# Patient Record
Sex: Female | Born: 1955
Health system: Southern US, Community
[De-identification: ages and names within clinical notes are randomized; demographics above are authoritative.]

## PROBLEM LIST (undated history)

## (undated) DIAGNOSIS — C801 Malignant (primary) neoplasm, unspecified: Secondary | ICD-10-CM

## (undated) DIAGNOSIS — K219 Gastro-esophageal reflux disease without esophagitis: Secondary | ICD-10-CM

## (undated) DIAGNOSIS — I10 Essential (primary) hypertension: Secondary | ICD-10-CM

## (undated) DIAGNOSIS — Z8669 Personal history of other diseases of the nervous system and sense organs: Secondary | ICD-10-CM

## (undated) DIAGNOSIS — L4050 Arthropathic psoriasis, unspecified: Secondary | ICD-10-CM

## (undated) DIAGNOSIS — Z87442 Personal history of urinary calculi: Secondary | ICD-10-CM

## (undated) DIAGNOSIS — N35919 Unspecified urethral stricture, male, unspecified site: Secondary | ICD-10-CM

## (undated) DIAGNOSIS — Z789 Other specified health status: Secondary | ICD-10-CM

## (undated) DIAGNOSIS — E785 Hyperlipidemia, unspecified: Secondary | ICD-10-CM

## (undated) DIAGNOSIS — E119 Type 2 diabetes mellitus without complications: Secondary | ICD-10-CM

## (undated) DIAGNOSIS — M1990 Unspecified osteoarthritis, unspecified site: Secondary | ICD-10-CM

## (undated) DIAGNOSIS — Z87440 Personal history of urinary (tract) infections: Secondary | ICD-10-CM

## (undated) DIAGNOSIS — M459 Ankylosing spondylitis of unspecified sites in spine: Secondary | ICD-10-CM

## (undated) DIAGNOSIS — C541 Malignant neoplasm of endometrium: Secondary | ICD-10-CM

## (undated) HISTORY — DX: Malignant (primary) neoplasm, unspecified: C80.1

## (undated) HISTORY — DX: Unspecified osteoarthritis, unspecified site: M19.90

## (undated) HISTORY — DX: Essential (primary) hypertension: I10

## (undated) HISTORY — DX: Type 2 diabetes mellitus without complications: E11.9

## (undated) HISTORY — PX: BREAST BIOPSY: SHX20

## (undated) HISTORY — PX: TONSILLECTOMY: SUR1361

## (undated) HISTORY — PX: EYE SURGERY: SHX253

## (undated) HISTORY — DX: Hyperlipidemia, unspecified: E78.5

---
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
## 1990-03-27 HISTORY — PX: CHOLECYSTECTOMY: SHX55

## 1997-11-09 ENCOUNTER — Encounter: Admission: RE | Admit: 1997-11-09 | Discharge: 1997-11-09 | Payer: Self-pay | Admitting: Internal Medicine

## 1997-12-21 ENCOUNTER — Encounter: Payer: Self-pay | Admitting: Hematology and Oncology

## 1997-12-21 ENCOUNTER — Other Ambulatory Visit: Admission: RE | Admit: 1997-12-21 | Discharge: 1997-12-21 | Payer: Self-pay | Admitting: *Deleted

## 1997-12-21 ENCOUNTER — Encounter: Admission: RE | Admit: 1997-12-21 | Discharge: 1997-12-21 | Payer: Self-pay | Admitting: Hematology and Oncology

## 1999-02-22 ENCOUNTER — Encounter: Admission: RE | Admit: 1999-02-22 | Discharge: 1999-02-22 | Payer: Self-pay | Admitting: Hematology and Oncology

## 1999-02-28 ENCOUNTER — Encounter: Admission: RE | Admit: 1999-02-28 | Discharge: 1999-02-28 | Payer: Self-pay | Admitting: Internal Medicine

## 1999-03-02 ENCOUNTER — Encounter: Admission: RE | Admit: 1999-03-02 | Discharge: 1999-03-02 | Payer: Self-pay | Admitting: Internal Medicine

## 2000-07-05 ENCOUNTER — Encounter: Payer: Self-pay | Admitting: Obstetrics and Gynecology

## 2000-07-05 ENCOUNTER — Ambulatory Visit (HOSPITAL_COMMUNITY): Admission: RE | Admit: 2000-07-05 | Discharge: 2000-07-05 | Payer: Self-pay

## 2000-07-06 ENCOUNTER — Other Ambulatory Visit: Admission: RE | Admit: 2000-07-06 | Discharge: 2000-07-06 | Payer: Self-pay | Admitting: Gynecology

## 2000-08-29 ENCOUNTER — Other Ambulatory Visit: Admission: RE | Admit: 2000-08-29 | Discharge: 2000-08-29 | Payer: Self-pay | Admitting: Gynecology

## 2000-08-29 ENCOUNTER — Encounter (INDEPENDENT_AMBULATORY_CARE_PROVIDER_SITE_OTHER): Payer: Self-pay

## 2001-12-17 ENCOUNTER — Encounter: Payer: Self-pay | Admitting: Otolaryngology

## 2001-12-19 ENCOUNTER — Ambulatory Visit (HOSPITAL_COMMUNITY): Admission: RE | Admit: 2001-12-19 | Discharge: 2001-12-19 | Payer: Self-pay | Admitting: Otolaryngology

## 2001-12-19 ENCOUNTER — Encounter (INDEPENDENT_AMBULATORY_CARE_PROVIDER_SITE_OTHER): Payer: Self-pay | Admitting: *Deleted

## 2002-01-12 ENCOUNTER — Observation Stay (HOSPITAL_COMMUNITY): Admission: EM | Admit: 2002-01-12 | Discharge: 2002-01-14 | Payer: Self-pay | Admitting: Emergency Medicine

## 2002-01-12 ENCOUNTER — Encounter: Payer: Self-pay | Admitting: Emergency Medicine

## 2002-01-14 ENCOUNTER — Encounter (HOSPITAL_BASED_OUTPATIENT_CLINIC_OR_DEPARTMENT_OTHER): Payer: Self-pay | Admitting: Internal Medicine

## 2002-01-16 ENCOUNTER — Encounter (HOSPITAL_BASED_OUTPATIENT_CLINIC_OR_DEPARTMENT_OTHER): Payer: Self-pay | Admitting: Internal Medicine

## 2002-01-16 ENCOUNTER — Ambulatory Visit (HOSPITAL_COMMUNITY): Admission: RE | Admit: 2002-01-16 | Discharge: 2002-01-16 | Payer: Self-pay | Admitting: Internal Medicine

## 2002-08-01 ENCOUNTER — Other Ambulatory Visit: Admission: RE | Admit: 2002-08-01 | Discharge: 2002-08-01 | Payer: Self-pay | Admitting: Gynecology

## 2005-11-23 ENCOUNTER — Other Ambulatory Visit: Payer: Self-pay

## 2005-11-28 ENCOUNTER — Ambulatory Visit: Payer: Self-pay | Admitting: Otolaryngology

## 2006-05-23 ENCOUNTER — Ambulatory Visit: Payer: Self-pay

## 2006-06-01 ENCOUNTER — Ambulatory Visit: Payer: Self-pay

## 2006-06-15 ENCOUNTER — Encounter: Admission: RE | Admit: 2006-06-15 | Discharge: 2006-06-15 | Payer: Self-pay | Admitting: Unknown Physician Specialty

## 2006-06-28 ENCOUNTER — Encounter: Admission: RE | Admit: 2006-06-28 | Discharge: 2006-06-28 | Payer: Self-pay | Admitting: Surgery

## 2006-06-28 ENCOUNTER — Encounter (INDEPENDENT_AMBULATORY_CARE_PROVIDER_SITE_OTHER): Payer: Self-pay | Admitting: Specialist

## 2007-01-25 ENCOUNTER — Emergency Department: Payer: Self-pay | Admitting: Emergency Medicine

## 2007-01-25 ENCOUNTER — Emergency Department (HOSPITAL_COMMUNITY): Admission: EM | Admit: 2007-01-25 | Discharge: 2007-01-25 | Payer: Self-pay | Admitting: Emergency Medicine

## 2010-08-12 NOTE — Op Note (Signed)
NAME:  Jean Gonzales NO.:  [id number]   MEDICAL RECORD NO.:  [id number]                   PATIENT TYPE:  OIB   LOCATION:  NA                                   FACILITY:  [facility]   PHYSICIAN:  Kinnie Scales. Annalee Genta, M.D.            DATE OF BIRTH:  06-05-1955   DATE OF PROCEDURE:  12/19/2001  DATE OF DISCHARGE:                                 OPERATIVE REPORT   PREOPERATIVE DIAGNOSES:  1. Chronic sore throat.  2. Lingual tonsillar hypertrophy.  3. Possible left tongue base mass.   POSTOPERATIVE DIAGNOSES:  1. Chronic sore throat.  2. Lingual tonsillar hypertrophy.  3. Possible left tongue base mass.   PROCEDURE PERFORMED:  1. Direct laryngoscopy.  2. Biopsy of left base of tongue.   SURGEON:  Kinnie Scales. Annalee Genta, M.D.   ANESTHESIA:  General endotracheal.   COMPLICATIONS:  None.   ESTIMATED BLOOD LOSS:  Minimal.   The patient transferred from the operating room to the recovery room in  stable condition.   INDICATIONS FOR PROCEDURE:  [name] is a 55 year old white female who has  been followed with a history of chronic low grade dysphagia, intermittent  sore throat and possible base of tongue mass.  The patient has been treated  for gastroesophageal reflux and has been treated with several courses of  antibiotics.  Over the last several months, she has had increasing symptoms  of fullness, sore throat and pressure in the left base of tongue region.  Previous imaging including MRI and CT scan showed linguotonsillar  hypertrophy with possible mass.  Given the patient's history and  examination, I recommended that we undertake examination under general  anesthesia including direct laryngoscopy and biopsy of the left base of  tongue.  The risks, benefits and possible complications of these procedures  were discussed in detail with the patient who understood and concurred with  our plan for surgery which was scheduled as outlined above.   DESCRIPTION OF PROCEDURE:  The patient was brought to the operating room on  December 19, 2001 and placed in supine position on the operating table.  General endotracheal anesthesia was established without difficulty.  When  the patient was adequately anesthetized, her oral cavity and oropharynx were  examined and there were no loose or broken teeth, no bleeding and no  evidence of mass or lesion.  Using a Dedo laryngoscope, direct laryngoscopy  was performed.  The piriform sinus, hypopharynx, larynx and vallecula were  thoroughly examined.  Vocal cords were normal and there was no evidence of  mass, ulcer or lesion.  The patient's base of tongue showed lingual  tonsillar hypertrophy.  Palpation revealed soft tissue, no evidence of a  mass or other abnormality.  There is no evidence of invasive lesion or  tumor.  Given the patient's history and findings on previous imaging  studies, a biopsy of the left base  of tongue was undertaken using large cup  forceps.  Multiple biopsies of the enlarged lingual tonsil and underlying  submucosal tissue were biopsied and sent to pathology for gross and  microscopic evaluation.  There was minimal bleeding.  The patient tolerated  the procedure without complication or difficulty.  At the completion of the  procedure, the oral cavity and oropharynx were irrigated and suctioned.  She  was awakened from her anesthetic, extubated and transferred from the  operating room to the recovery room in stable condition.                                                 Kinnie Scales. Annalee Genta, M.D.    DLS/MEDQ  D:  91/[id number]/8295  T:  12/19/2001  Job:  [id number]

## 2010-08-12 NOTE — Discharge Summary (Signed)
NAME:  Jean Gonzales NO.:  [id number]   MEDICAL RECORD NO.:  [id number]                   PATIENT TYPE:  INP   LOCATION:  2021                                 FACILITY:  [facility]   PHYSICIAN:  Barry Dienes. Eloise Harman, M.D.            DATE OF BIRTH:  March 10, 1956   DATE OF ADMISSION:  01/12/2002  DATE OF DISCHARGE:  01/14/2002                                 DISCHARGE SUMMARY   PERTINENT FINDINGS:  The patient is a 55 year old white female with a  history of ankylosing spondylitis, gastroesophageal reflux disease, and  anxiety who presented to the [facility] after developing  sudden pleuritic chest pain at approximately  10 a.m. while she was on the  way to church.  The pain persisted over one half hour at church, so she  presented to the emergency room  for evaluation.  The pain persisted in the  emergency room.  She was not given nitroglycerin as her systolic blood  pressure  was less than 100.  She also related that she had had twinges of  similar discomfort radiating to her left arm earlier in the week.  She  denied significant dyspnea, nausea, vomiting, or diaphoresis.  She notes  that this discomfort was somewhat different than her typical reflux  discomfort that is more of a burning lower chest pain and not so sharp and  pleuritic.  She denied difficulty swallowing.  There is no family history of  a hypercoagulable disorder.   INITIAL PHYSICAL EXAMINATION:  Temperature was 97.4, pulse 62, respiratory  rate 16, blood pressure  99/54.  Oxygen saturation on room air 98%.  In  general, she is a well-nourished, well-developed white female who was in no  acute distress.  HEENT exam was within normal limits.  Neck was supple  without jugular venous distention or carotid bruit.  Chest was clear to  auscultation.  Heart had a regular rate and rhythm.  S1 and S2 were present  without murmur, gallop or rub.  Abdomen had normal bowel sounds with no  hepatosplenomegaly or tenderness.  Extremities were without cyanosis,  clubbing or edema.  Neurological exam was nonfocal.   LABORATORY STUDIES:  EKG showed normal sinus rhythm, no acute changes.  White blood cell count 8.5, hemoglobin 12.9, hematocrit 39, platelets 286,  BUN 10, creatinine 0.3, CK 68, troponin I 0.05.  Arterial blood gases within  normal limits.   HOSPITAL COURSE:  The patient was admitted for further evaluation.  Serial  cardiac enzymes included a subsequent CK level that was 56 and a  subsequently troponin I level that was 0.05.  A chest x-ray showed no acute  cardiopulmonary disease.  A ventilation perfusion scan was a normal study.  A Cardiolite exercise test was performed on 10/21 in which she exercised  nine minutes on the Bruce protocol developing a peak heart rate of 156 with  a target of 146 and displaying a normal blood pressure  response to  exercise.  She had no chest discomfort and no significant EKG changes.  Cardiolite images were pending at the time of dictation.   PROCEDURES:  Ventilation perfusion scan and Cardiolite exercise test.   COMPLICATIONS:  None.   CONDITION ON DISCHARGE:  She feels fine and is no longer having any chest  discomfort.  She denies shortness of breath or nausea.   DISCHARGE DIAGNOSES:  1. Chest pain, pleuritic.  2. Gastroesophageal reflux disease.  3. Ankylosing spondylitis.  4. Anxiety and depression.   DISCHARGE MEDICATIONS:  1. Nexium 40 mg p.o. q.d.  2. Lexapro 10 mg p.o. q.d.   SPECIAL INSTRUCTIONS:  She will be scheduled for an upper GI series prior to  discharge.   FOLLOWUP PLANS:  She should be seen at Dr. Silvano Rusk office within two to  three weeks following discharge and was given a number to call to set up  that appointment.                                               Barry Dienes Eloise Harman, M.D.    DGP/MEDQ  D:  01/14/2002  T:  01/14/2002  Job:  [id number]

## 2010-08-12 NOTE — H&P (Signed)
NAME:  Jean Gonzales NO.:  [id number]   MEDICAL RECORD NO.:  [id number]                   PATIENT TYPE:  INP   LOCATION:  2021                                 FACILITY:  [facility]   PHYSICIAN:  Gaspar Garbe, M.D.            DATE OF BIRTH:  Nov 21, 1955   DATE OF ADMISSION:  01/12/2002  DATE OF DISCHARGE:                                HISTORY & PHYSICAL   CHIEF COMPLAINT:  Chest pain.   HISTORY OF PRESENT ILLNESS:  The patient is a 55 year old white female with  a history of ankylosing spondylitis, anxiety, and gastroesophageal reflux  disease who presents to the ER after developing chest pain which is sharp in  onset and took her breath at 10 a.m.  She was on her way to church when it  happened.  Spent approximately 1/2 hour at church, but the pain was too bad  and went home, and subsequently went to the emergency room.  She did not  receive nitroglycerin because her normal systolic blood pressure was less  than 100.  She indicates that the pain is still there.  She describes  earlier in the week having some pain into her left arm, which she thought  was more muscular in nature, being located at the shoulder and along the  brachial radialis on the left arm.  She indicates that she still has some  pain and is tender to the touch, and it hurts worse with a deep breath.  She  indicates that she has had a pain like this approximately two months ago  which was accompanied with some vertigo.  This episode, however, has not  been accompanied with anything else other then chest pain, and she denies  any sort of nausea, vomiting, headache, or other changes otherwise.   ALLERGIES:  IODINE.   MEDICATIONS:  1. Nexium 40 mg q.d.  2. Lexapro 10 mg q.d.   PAST MEDICAL HISTORY:  1. Ankylosing spondylitis.  2. Gastroesophageal reflux disease.  3. Anxiety/depression.   PAST SURGICAL HISTORY:  1. Laryngeal biopsy recently by Dr. Osborn Coho here in  [place].  The     results of that are not available and thought to be benign.  2. Tonsillectomy at age 17.  3. Cholecystectomy.  4. Ovarian removal.  The patient believes this to be on the right, secondary     to an ovarian rupture.  5. Right shoulder surgery, thought to be bone spur.   SOCIAL HISTORY:  The patient lives in [place] by herself.  Her son lives  there part-time.  She works for [employer], performing x-rays.  The patient is currently separated with two children.  One of her children  was murdered at the age of 69.  The patient is a nonsmoker, nondrinker, and  does not prescribe to any sick diet.   FAMILY HISTORY:  Mother died of cancer of the  lung at age 22.  Father is  alive at age 24.  He has had multiple difficulties with peripheral vascular  disease with bypasses in his legs.  He has also had carotid endarterectomy,  and has an aneurysm which is inoperable, as well as heart disease and  pulmonary problems.  She has two brothers who also have heart disease and  hypertension, and one has had mild transient ischemic attacks.  She has  three brothers and two sisters total.   REVIEW OF SYMPTOMS:  Negative for fevers, chills, or sweats, headache,  nausea, or vomiting.  The patient does note chest pain with no shortness of  breath at rest, and no dyspnea on exertion.  Denies any sort of cough or  wheezing.  Denies any change in level of consciousness.  All other systems  are negative.   ADVANCED DIRECTIVES:  The patient is a full code.   PHYSICAL EXAMINATION:  VITAL SIGNS:  Temperature 97.4, pulse 62, respiratory  rate 16, blood pressure 99/54, pain is a 2/10.  Currently oxygen saturations  are 98%.  GENERAL:  In no acute distress.  HEENT:  Pupils equal, round, reactive to light and accommodation.  Extraocular movements were intact.  ENT within normal limits.  NECK:  Supple, no lymphadenopathy, JVD, or bruit.  HEART:  Regular rate and rhythm, no  murmurs, rubs, or gallops.  LUNGS:  Clear to auscultation bilaterally.  CHEST:  The patient has some tenderness along the clavicular line of her  breast into her muscle on the left.  ABDOMEN:  Soft, nontender, normoactive bowel sounds, no hepatosplenomegaly  appreciated.  EXTREMITIES:  No cyanosis, clubbing, or edema.  The patient has mild  shoulder and elbow pain on the left with mild compression.  MUSCULOSKELETAL:  Except for above, otherwise normal.  NEUROLOGIC:  Oriented x3 with 1+ deep tendon reflexes and downgoing toes.   LABORATORY DATA:  The patient had a normal V/Q scan and normal chest x-ray.  Her electrocardiogram was normal sinus rhythm with no acute changes.  White  count 8.5, hemoglobin 12.9, hematocrit 39, platelets 286.  BUN 10,  creatinine 0.3.  Electrolytes otherwise within normal limits.  CK 68, CK-MB  1.5, troponin-I borderline elevated at 0.05.  She had an ABG which was  within normal limits as well.   ASSESSMENT AND PLAN:  1. Chest pain.  Pleuritic versus muscular versus cardiac.  She has     borderline initial enzymes and will be kept on telemetry and ruled out     with enzymes.  She will be given 125 mg of aspirin, beta blockade was     held secondary to her low blood pressure, and we will try to use     nitroglycerin if her blood pressure goes above her normal low, although     due to the palpable nature of her chest pain, I doubt that this is     specifically cardiac.  She denies any sort of trauma to her chest or     anything that could have caused the pain.  2. Anxiety.  Continue Lexapro.  3. Gastroesophageal reflux disease.  Continue Nexium.  4. Ankylosing spondylitis.  We will provide Tylenol and ask that she do her     normal stretching while in the hospital as back pain may worsen due to a     confined nature given her need to stay in bed on telemetry.  Gaspar Garbe, M.D.    RWT/MEDQ  D:   01/12/2002  T:  01/13/2002  Job:  [id number]

## 2011-01-04 LAB — CBC
HCT: 37.2
MCHC: 34
MCV: 86.8
RBC: 4.29
RDW: 13.5
WBC: 9.2

## 2011-01-04 LAB — URINALYSIS, ROUTINE W REFLEX MICROSCOPIC
Glucose, UA: NEGATIVE
Nitrite: NEGATIVE

## 2011-01-04 LAB — I-STAT 8, (EC8 V) (CONVERTED LAB)
Acid-Base Excess: 2
Bicarbonate: 28.4 — ABNORMAL HIGH
Chloride: 109
Glucose, Bld: 92
Hemoglobin: 14.3
Operator id: 270111
Potassium: 3.4 — ABNORMAL LOW
Sodium: 143
pCO2, Ven: 50.9 — ABNORMAL HIGH
pH, Ven: 7.354 — ABNORMAL HIGH

## 2011-01-04 LAB — POCT CARDIAC MARKERS
Myoglobin, poc: 58.8
Myoglobin, poc: 70.2
Operator id: 234501
Troponin i, poc: <0.05
Troponin i, poc: <0.05

## 2011-01-04 LAB — DIFFERENTIAL
Basophils Relative: 1
Lymphs Abs: 3
Neutrophils Relative %: 59

## 2011-01-04 LAB — URINE MICROSCOPIC-ADD ON

## 2011-01-04 LAB — POCT I-STAT CREATININE
Creatinine, Ser: 0.7
Operator id: 270111

## 2011-01-04 LAB — POCT PREGNANCY, URINE: Preg Test, Ur: NEGATIVE

## 2012-03-27 DIAGNOSIS — C801 Malignant (primary) neoplasm, unspecified: Secondary | ICD-10-CM

## 2012-03-27 DIAGNOSIS — C541 Malignant neoplasm of endometrium: Secondary | ICD-10-CM

## 2012-03-27 HISTORY — DX: Malignant neoplasm of endometrium: C54.1

## 2012-03-27 HISTORY — DX: Malignant (primary) neoplasm, unspecified: C80.1

## 2012-05-20 ENCOUNTER — Ambulatory Visit: Payer: Self-pay

## 2012-12-11 ENCOUNTER — Ambulatory Visit: Payer: Self-pay

## 2012-12-13 ENCOUNTER — Ambulatory Visit: Payer: Self-pay

## 2012-12-19 ENCOUNTER — Ambulatory Visit: Payer: Self-pay

## 2013-03-14 DIAGNOSIS — M069 Rheumatoid arthritis, unspecified: Secondary | ICD-10-CM | POA: Insufficient documentation

## 2013-03-27 HISTORY — PX: ABDOMINAL HYSTERECTOMY: SHX81

## 2013-12-03 DIAGNOSIS — IMO0002 Reserved for concepts with insufficient information to code with codable children: Secondary | ICD-10-CM | POA: Insufficient documentation

## 2013-12-03 DIAGNOSIS — N35919 Unspecified urethral stricture, male, unspecified site: Secondary | ICD-10-CM | POA: Insufficient documentation

## 2013-12-29 ENCOUNTER — Ambulatory Visit: Payer: Self-pay | Admitting: Family Medicine

## 2014-04-21 DIAGNOSIS — R938 Abnormal findings on diagnostic imaging of other specified body structures: Secondary | ICD-10-CM | POA: Diagnosis not present

## 2014-04-21 DIAGNOSIS — C541 Malignant neoplasm of endometrium: Secondary | ICD-10-CM | POA: Diagnosis not present

## 2014-06-03 DIAGNOSIS — M069 Rheumatoid arthritis, unspecified: Secondary | ICD-10-CM | POA: Diagnosis not present

## 2014-06-03 DIAGNOSIS — I10 Essential (primary) hypertension: Secondary | ICD-10-CM | POA: Diagnosis not present

## 2014-06-03 DIAGNOSIS — Z1389 Encounter for screening for other disorder: Secondary | ICD-10-CM | POA: Diagnosis not present

## 2014-06-03 DIAGNOSIS — F329 Major depressive disorder, single episode, unspecified: Secondary | ICD-10-CM | POA: Diagnosis not present

## 2014-06-03 DIAGNOSIS — K219 Gastro-esophageal reflux disease without esophagitis: Secondary | ICD-10-CM | POA: Diagnosis not present

## 2014-06-03 DIAGNOSIS — E119 Type 2 diabetes mellitus without complications: Secondary | ICD-10-CM | POA: Diagnosis not present

## 2014-06-03 DIAGNOSIS — E782 Mixed hyperlipidemia: Secondary | ICD-10-CM | POA: Diagnosis not present

## 2014-06-03 DIAGNOSIS — E559 Vitamin D deficiency, unspecified: Secondary | ICD-10-CM | POA: Diagnosis not present

## 2014-07-13 DIAGNOSIS — G629 Polyneuropathy, unspecified: Secondary | ICD-10-CM | POA: Diagnosis not present

## 2014-07-13 DIAGNOSIS — C55 Malignant neoplasm of uterus, part unspecified: Secondary | ICD-10-CM | POA: Diagnosis not present

## 2014-07-13 DIAGNOSIS — C541 Malignant neoplasm of endometrium: Secondary | ICD-10-CM | POA: Diagnosis not present

## 2014-07-13 DIAGNOSIS — Z79899 Other long term (current) drug therapy: Secondary | ICD-10-CM | POA: Diagnosis not present

## 2014-07-13 DIAGNOSIS — I10 Essential (primary) hypertension: Secondary | ICD-10-CM | POA: Diagnosis not present

## 2014-07-13 DIAGNOSIS — E119 Type 2 diabetes mellitus without complications: Secondary | ICD-10-CM | POA: Diagnosis not present

## 2014-09-16 DIAGNOSIS — E119 Type 2 diabetes mellitus without complications: Secondary | ICD-10-CM | POA: Diagnosis not present

## 2014-10-06 DIAGNOSIS — Z90710 Acquired absence of both cervix and uterus: Secondary | ICD-10-CM | POA: Diagnosis not present

## 2014-10-06 DIAGNOSIS — Z08 Encounter for follow-up examination after completed treatment for malignant neoplasm: Secondary | ICD-10-CM | POA: Diagnosis not present

## 2014-10-06 DIAGNOSIS — R87610 Atypical squamous cells of undetermined significance on cytologic smear of cervix (ASC-US): Secondary | ICD-10-CM | POA: Diagnosis not present

## 2014-10-06 DIAGNOSIS — R300 Dysuria: Secondary | ICD-10-CM | POA: Diagnosis not present

## 2014-10-06 DIAGNOSIS — E119 Type 2 diabetes mellitus without complications: Secondary | ICD-10-CM | POA: Diagnosis not present

## 2014-10-06 DIAGNOSIS — Z1272 Encounter for screening for malignant neoplasm of vagina: Secondary | ICD-10-CM | POA: Diagnosis not present

## 2014-10-06 DIAGNOSIS — Z90722 Acquired absence of ovaries, bilateral: Secondary | ICD-10-CM | POA: Diagnosis not present

## 2014-10-06 DIAGNOSIS — Z923 Personal history of irradiation: Secondary | ICD-10-CM | POA: Diagnosis not present

## 2014-10-06 DIAGNOSIS — C541 Malignant neoplasm of endometrium: Secondary | ICD-10-CM | POA: Diagnosis not present

## 2014-10-06 DIAGNOSIS — R87620 Atypical squamous cells of undetermined significance on cytologic smear of vagina (ASC-US): Secondary | ICD-10-CM | POA: Diagnosis not present

## 2014-10-06 DIAGNOSIS — Z8544 Personal history of malignant neoplasm of other female genital organs: Secondary | ICD-10-CM | POA: Diagnosis not present

## 2014-10-06 DIAGNOSIS — Z79899 Other long term (current) drug therapy: Secondary | ICD-10-CM | POA: Diagnosis not present

## 2015-01-04 DIAGNOSIS — Z7982 Long term (current) use of aspirin: Secondary | ICD-10-CM | POA: Diagnosis not present

## 2015-01-04 DIAGNOSIS — K219 Gastro-esophageal reflux disease without esophagitis: Secondary | ICD-10-CM | POA: Diagnosis not present

## 2015-01-04 DIAGNOSIS — C541 Malignant neoplasm of endometrium: Secondary | ICD-10-CM | POA: Diagnosis not present

## 2015-01-04 DIAGNOSIS — N950 Postmenopausal bleeding: Secondary | ICD-10-CM | POA: Diagnosis not present

## 2015-01-04 DIAGNOSIS — I10 Essential (primary) hypertension: Secondary | ICD-10-CM | POA: Diagnosis not present

## 2015-01-04 DIAGNOSIS — M069 Rheumatoid arthritis, unspecified: Secondary | ICD-10-CM | POA: Diagnosis not present

## 2015-01-04 DIAGNOSIS — E1142 Type 2 diabetes mellitus with diabetic polyneuropathy: Secondary | ICD-10-CM | POA: Diagnosis not present

## 2015-02-22 ENCOUNTER — Ambulatory Visit (INDEPENDENT_AMBULATORY_CARE_PROVIDER_SITE_OTHER): Payer: Medicare Other | Admitting: Physician Assistant

## 2015-02-22 ENCOUNTER — Encounter: Payer: Self-pay | Admitting: Physician Assistant

## 2015-02-22 VITALS — BP 114/80 | HR 66 | Temp 98.0°F | Resp 16 | Ht 67.0 in | Wt 250.8 lb

## 2015-02-22 DIAGNOSIS — E1142 Type 2 diabetes mellitus with diabetic polyneuropathy: Secondary | ICD-10-CM

## 2015-02-22 DIAGNOSIS — R32 Unspecified urinary incontinence: Secondary | ICD-10-CM

## 2015-02-22 DIAGNOSIS — R530 Neoplastic (malignant) related fatigue: Secondary | ICD-10-CM | POA: Diagnosis not present

## 2015-02-22 DIAGNOSIS — E1169 Type 2 diabetes mellitus with other specified complication: Secondary | ICD-10-CM | POA: Insufficient documentation

## 2015-02-22 DIAGNOSIS — Z7689 Persons encountering health services in other specified circumstances: Secondary | ICD-10-CM

## 2015-02-22 DIAGNOSIS — M069 Rheumatoid arthritis, unspecified: Secondary | ICD-10-CM | POA: Diagnosis not present

## 2015-02-22 DIAGNOSIS — I152 Hypertension secondary to endocrine disorders: Secondary | ICD-10-CM | POA: Insufficient documentation

## 2015-02-22 DIAGNOSIS — E785 Hyperlipidemia, unspecified: Secondary | ICD-10-CM | POA: Diagnosis not present

## 2015-02-22 DIAGNOSIS — I10 Essential (primary) hypertension: Secondary | ICD-10-CM | POA: Insufficient documentation

## 2015-02-22 DIAGNOSIS — Z7189 Other specified counseling: Secondary | ICD-10-CM

## 2015-02-22 DIAGNOSIS — C541 Malignant neoplasm of endometrium: Secondary | ICD-10-CM | POA: Diagnosis not present

## 2015-02-22 DIAGNOSIS — E1159 Type 2 diabetes mellitus with other circulatory complications: Secondary | ICD-10-CM | POA: Insufficient documentation

## 2015-02-22 DIAGNOSIS — M1990 Unspecified osteoarthritis, unspecified site: Secondary | ICD-10-CM | POA: Insufficient documentation

## 2015-02-22 DIAGNOSIS — Z8669 Personal history of other diseases of the nervous system and sense organs: Secondary | ICD-10-CM | POA: Insufficient documentation

## 2015-02-22 DIAGNOSIS — L4050 Arthropathic psoriasis, unspecified: Secondary | ICD-10-CM

## 2015-02-22 DIAGNOSIS — Z86010 Personal history of colonic polyps: Secondary | ICD-10-CM | POA: Insufficient documentation

## 2015-02-22 DIAGNOSIS — K219 Gastro-esophageal reflux disease without esophagitis: Secondary | ICD-10-CM | POA: Insufficient documentation

## 2015-02-22 MED ORDER — NYSTATIN 100000 UNIT/GM EX POWD: CUTANEOUS | Status: DC

## 2015-02-22 MED ORDER — TRIAMCINOLONE ACETONIDE 0.1 % EX OINT: TOPICAL_OINTMENT | Freq: Two times a day (BID) | CUTANEOUS | Status: DC

## 2015-02-22 NOTE — Progress Notes (Signed)
Patient: Jean Gonzales, Female    DOB: 1955-06-12, 59 y.o.   MRN: [id number] Visit Date: 02/22/2015  Today's Provider: Mar Daring, PA-C   Chief Complaint  Patient presents with  . Establish Care   Subjective:    Annual physical exam Jean Gonzales is a 60 y.o. female who presents today for health maintenance and complete physical. She feels well. She reports not exercising. She reports she is sleeping well. Per patient just here to Establish Care. Per patient moved from [place]. Patient gets pap smear every 3 months done at [facility]. Patient has a history of endometrial cancer and serous cancer. She underwent total hysterectomy followed by 2 types of chemotherapy and radiation therapy.  In December 18, 2013 patient was cancer free. Per patient thinks that is due for a mammogram. She gets a mammogram done every 6 months.  This is also done at [facility].  She also follows up with her radiologist every 3 months as well.  She states she was most recently seen for her pap in September and should go back in late December to early January.  She also will be seeing her radiologist come early December. She also has rheumatoid arthritis. She had rheumatoid arthritis prior to the cancer diagnosis 2 years ago. She states that her wrist has completely fused and she has no range of motion in it now. She can still move the fingers of the right hand however. She also states that she has arthritis in her cervical spine and lumbar spine. She also has psoriatic arthritis. His has worsened since the chemotherapy. She has a current flare of her scalp and right lower posterior calf. She has been putting triamcinolone cream on this. She would like a referral to dermatology for further evaluation of this.  Review of Systems  Constitutional: Negative.   HENT: Negative.   Eyes: Negative.   Respiratory: Negative.   Cardiovascular: Negative.   Gastrointestinal: Positive for diarrhea (chronic  since chemotherapy).  Endocrine: Negative.   Genitourinary: Positive for frequency Laurance Flatten, and since chemotherapy and total hysterectomy.) and enuresis (Has worsened since total hysterectomy and radiation therapy.). Negative for menstrual problem and pelvic pain.  Musculoskeletal: Positive for back pain, arthralgias, neck pain and neck stiffness.  Allergic/Immunologic: Negative.   Neurological: Positive for dizziness, light-headedness (One blood sugar drops) and numbness (Neuropathy of her feet and legs secondary to chemotherapy; numbness up her right leg and pelvic region secondary to radiation therapy).  Hematological: Negative.   Psychiatric/Behavioral: Negative.     Social History      She  reports that she has never smoked. She has never used smokeless tobacco. She reports that she does not drink alcohol or use illicit drugs.       Social History   Social History  . Marital Status: Divorced    Spouse Name: N/A  . Number of Children: N/A  . Years of Education: N/A   Social History Main Topics  . Smoking status: Never Smoker   . Smokeless tobacco: Never Used  . Alcohol Use: No  . Drug Use: No  . Sexual Activity: Not Asked   Other Topics Concern  . None   Social History Narrative  . None    Patient Active Problem List   Diagnosis Date Noted  . Malignant neoplasm of endometrium ([place]) 02/22/2015  . Acid reflux 02/22/2015  . History of migraine headaches 02/22/2015  . HLD (hyperlipidemia) 02/22/2015  . BP (high blood pressure)  02/22/2015  . Arthritis 02/22/2015  . Cancer of endometrium ([place]) 02/22/2015  . Diabetes mellitus ([place]) 10/06/2014  . Urethral stenosis 12/03/2013  . Rheumatoid arthritis ([place]) 03/14/2013    Past Surgical History  Procedure Laterality Date  . Abdominal hysterectomy  2015    cancer    Family History        No family status information on file.        Her family history is not on file.    Allergies  Allergen Reactions  . Iodinated  Diagnostic Agents Anaphylaxis  . Iodine Swelling    Previous Medications   ASPIRIN EC 81 MG TABLET    Take 81 mg by mouth.   ATORVASTATIN (LIPITOR) 40 MG TABLET       CELECOXIB (CELEBREX) 100 MG CAPSULE    Take 100 mg by mouth 2 (two) times daily.   GABAPENTIN (NEURONTIN) 300 MG CAPSULE    TAKE 2 CAPSULES (600MG ) BY MOUTH 3 TIMES A DAY   HYDROCHLOROTHIAZIDE (HYDRODIURIL) 25 MG TABLET    Take by mouth.   MAGNESIUM OXIDE 420 MG TABS    Take by mouth.   METFORMIN (GLUCOPHAGE) 500 MG TABLET    TAKE 2 TABLETS BY MOUTH 2 TIMES A DAY   MULTIPLE VITAMIN (MULTI-VITAMINS) TABS    Take by mouth.   NAPROXEN SODIUM (ALEVE) 220 MG TABLET    Take 220 mg by mouth.   NEXIUM 20 MG CAPSULE    TAKE ONE CAPSULE BY MOUTH EVERY DAY IF NO RECURRENCE   NYSTATIN (MYCOSTATIN) POWDER    Apply to affected area 3 times daily   PYRIDOXINE (VITAMIN B-6) 25 MG TABLET    Take by mouth.   TRIAMCINOLONE OINTMENT (KENALOG) 0.1 %        Patient Care Team: Mar Daring, PA-C as PCP - General (Family Medicine)     Objective:   Vitals: BP 114/80 mmHg  Pulse 66  Temp(Src) 98 F (36.7 C) (Oral)  Resp 16  Ht 5\' 7"  (1.702 m)  Wt 250 lb 12.8 oz (113.762 kg)  BMI 39.27 kg/m2  SpO2 97%   Physical Exam  Constitutional: She is oriented to person, place, and time. She appears well-developed and well-nourished. No distress.  HENT:  Head: Normocephalic and atraumatic.  Right Ear: External ear normal.  Left Ear: External ear normal.  Nose: Nose normal.  Mouth/Throat: Oropharynx is clear and moist. No oropharyngeal exudate.  Eyes: Conjunctivae and EOM are normal. Pupils are equal, round, and reactive to light. Right eye exhibits no discharge. Left eye exhibits no discharge. No scleral icterus.  Neck: Normal range of motion. Neck supple. No JVD present. No tracheal deviation present. No thyromegaly present.  Cardiovascular: Normal rate, regular rhythm, normal heart sounds and intact distal pulses.  Exam reveals no  gallop and no friction rub.   No murmur heard. Pulmonary/Chest: Effort normal and breath sounds normal. No respiratory distress. She has no wheezes. She has no rales. She exhibits no tenderness.    Abdominal: Soft. Bowel sounds are normal. She exhibits no distension and no mass. There is no tenderness. There is no rebound and no guarding.  Musculoskeletal: She exhibits no edema.       Right wrist: She exhibits decreased range of motion, tenderness, bony tenderness and swelling.       Left wrist: Normal.       Cervical back: She exhibits decreased range of motion. She exhibits no tenderness, no bony tenderness, no swelling, no edema, no pain,  no spasm and normal pulse.       Lumbar back: She exhibits decreased range of motion. She exhibits no tenderness, no bony tenderness, no swelling, no edema, no pain and no spasm.  Lymphadenopathy:    She has no cervical adenopathy.  Neurological: She is alert and oriented to person, place, and time.  Skin: Skin is warm and dry. Rash noted. She is not diaphoretic.     Psychiatric: She has a normal mood and affect. Her behavior is normal. Judgment and thought content normal.  Vitals reviewed.    Depression Screen No flowsheet data found.    Assessment & Plan:     Routine Health Maintenance and Physical Exam 1. Establishing care with new doctor, encounter for Recently moved from [place], [place] to [place]. Establishing with new provider due to convenience of location.  2. Cancer of endometrium ([place]) [facility] Previously was seen by Dr. Maudie Mercury. Patient states that he left the practice and she is now seen by a new female provider that cannot remember her name. She is followed every 3 months with Pap smears and blood work.  3. Urinary incontinence, unspecified incontinence type Urinary incontinence secondary to total hysterectomy that was done for endometrial cancer. She states that this is somewhat problematic for  her that she has learned to deal with it and make adjustments. I did discuss with her toilet timing and advised for her to try to go to the bathroom every 2-3 hours to prevent accidents. I also advised for when she goes for her to sit for an extra 5 minutes or so to make sure she is completely finished emptying her bladder.  5. Type 2 diabetes mellitus with diabetic polyneuropathy, without long-term current use of insulin (HCC) Was diagnosed with type 2 diabetes while undergoing chemotherapy. She states her most recent hemoglobin A1c was 6.5. At diagnosis and had been greater than 9. She is currently taking metformin 500 mg 2 pills twice daily. She is also trying to adhere to a low carbohydrate low sugar diet. She feels she is doing well at this time. I will recheck her hemoglobin A1c and follow-up pending lab results. If labs are stable I will see her back in 6 months for further evaluation. - HgB A1c  6. Rheumatoid arthritis involving multiple sites, unspecified rheumatoid factor presence (HCC) Rheumatoid arthritis present in right wrist with total fusion of the joint and loss of range of motion. She also states that she has rheumatoid arthritis in her cervical spine and lumbar spine. She states that she feels she may be starting to develop some symptoms in her ankles but she cannot tell if that is arthritis or her neuropathy that she has secondary to the chemotherapy. She does take Celebrex daily. And naproxen only as needed. She cannot take any other treatment for rheumatoid arthritis secondary to the cancer diagnosis.  7. Psoriatic arthritis ([place]) Currently with a flare on her right posterior leg and her scalp. She does put triamcinolone cream on the flares but this does not control symptoms completely. She still has hair loss over the psoriatic patch on her scalp. She knows that she would not be a good candidate for Biologics due to her cancer diagnosis but she would still prefer a referral to  dermatology to see if there is any other treatment options for her. I will make this referral as below. I will also refill her triamcinolone cream and nystatin powder as below. I will follow-up with her  in 6 months to see how she is doing. - triamcinolone ointment (KENALOG) 0.1 %; Apply topically 2 (two) times daily. As needed  Dispense: 80 g; Refill: 1 - nystatin (MYCOSTATIN) powder; Apply to affected area 3 times daily  Dispense: 15 g; Refill: 1 - Ambulatory referral to Dermatology  8. Neoplastic malignant related fatigue Increased fatigue following chemotherapy and radiation. Last chemotherapy treatment was over 1 year ago. She states that she has noticed some of her energy returning but she still does have a difficult time with certain activities. She does state that when she first finished chemotherapy she could not wash once the dishes without having to take a break. She states now she can wash dishes but she does have to take breaks afterwards and sometimes during other activities such as sweeping or vacuuming. I will check her TSH to make sure that it is not abnormal in leading to increased fatigue. I will follow-up with her pending her lab results. If labs are stable I will follow-up with her in 6 months. He may call the office in the meantime if she has any worsening symptoms, questions or concerns. - TSH  9. HLD (hyperlipidemia) Stable and currently taking atorvastatin 40 mg. I will check her cholesterol as it has not been checked recently. I will follow-up with her pending lab results. If her labs are stable I will follow-up with her in 6 months. She may call the office in the meantime if she has any questions or concerns. - Lipid panel   Exercise Activities and Dietary recommendations Goals    None       There is no immunization history on file for this patient.  Health Maintenance  Topic Date Due  . HEMOGLOBIN A1C  Aug 20, 1955  . Hepatitis C Screening  07-19-1955  .  PNEUMOCOCCAL POLYSACCHARIDE VACCINE (1) 12/18/1955  . FOOT EXAM  12/18/1963  . OPHTHALMOLOGY EXAM  12/18/1963  . URINE MICROALBUMIN  12/18/1963  . HIV Screening  12/17/1968  . TETANUS/TDAP  12/17/1972  . PAP SMEAR  12/18/1974  . COLONOSCOPY  12/18/2003  . MAMMOGRAM  06/27/2008  . ZOSTAVAX  12/17/2013  . INFLUENZA VACCINE  10/26/2014      Discussed health benefits of physical activity, and encouraged her to engage in regular exercise appropriate for her age and condition.    --------------------------------------------------------------------

## 2015-02-22 NOTE — Patient Instructions (Signed)

## 2015-03-02 DIAGNOSIS — E785 Hyperlipidemia, unspecified: Secondary | ICD-10-CM | POA: Diagnosis not present

## 2015-03-02 DIAGNOSIS — R530 Neoplastic (malignant) related fatigue: Secondary | ICD-10-CM | POA: Diagnosis not present

## 2015-03-02 DIAGNOSIS — E1142 Type 2 diabetes mellitus with diabetic polyneuropathy: Secondary | ICD-10-CM | POA: Diagnosis not present

## 2015-03-03 LAB — LIPID PANEL
CHOLESTEROL TOTAL: 152 mg/dL (ref 100–199)
Chol/HDL Ratio: 4.6 ratio units — ABNORMAL HIGH (ref 0.0–4.4)
HDL: 33 mg/dL — ABNORMAL LOW (ref >39)
LDL CALC: 74 mg/dL (ref 0–99)
TRIGLYCERIDES: 223 mg/dL — AB (ref 0–149)
VLDL CHOLESTEROL CAL: 45 mg/dL — AB (ref 5–40)

## 2015-03-03 LAB — HEMOGLOBIN A1C
ESTIMATED AVERAGE GLUCOSE: 166 mg/dL
Hgb A1c MFr Bld: 7.4 % — ABNORMAL HIGH (ref 4.8–5.6)

## 2015-03-03 LAB — TSH: TSH: 1.59 u[IU]/mL (ref 0.450–4.500)

## 2015-03-04 ENCOUNTER — Other Ambulatory Visit: Payer: Self-pay

## 2015-03-04 DIAGNOSIS — E7800 Pure hypercholesterolemia, unspecified: Secondary | ICD-10-CM

## 2015-03-04 DIAGNOSIS — E1142 Type 2 diabetes mellitus with diabetic polyneuropathy: Secondary | ICD-10-CM

## 2015-03-04 MED ORDER — EMPAGLIFLOZIN 10 MG PO TABS: 10.0000 mg | ORAL_TABLET | Freq: Every day | ORAL | Status: DC

## 2015-03-04 NOTE — Telephone Encounter (Signed)
We can add a low dose of jardiance and recheck in 3 months.  If jardiance is too expensive please let us know.  We can try an alternative if this is too expensive.  Thanks.

## 2015-03-04 NOTE — Telephone Encounter (Signed)
Patient advised as directed below. Patient already taking 2000 mg in the morning and afternoon. Per patient is there anything else that she can do since she is already doing that?  Thanks,  -Adelee Hannula

## 2015-03-04 NOTE — Telephone Encounter (Signed)
LMTCB  Thanks,  -[name] 

## 2015-03-04 NOTE — Telephone Encounter (Signed)
-----   Message from Mar Daring, [place] sent at 03/04/2015  9:16 AM EST ----- All labs are within normal limits and stable with the exception of hemoglobin A1c. Hemoglobin A1c has increased back to 7.4 from 6.5. I do recommend increasing metformin back to 1000 mg twice daily and continue with diabetic diet as you have been doing. We can recheck hemoglobin A1c again in 3-6 months to see how it is doing with this change in the metformin.  Thanks! -JB

## 2015-03-08 NOTE — Telephone Encounter (Signed)
LMTCB  Thanks,  -[name] 

## 2015-03-09 NOTE — Telephone Encounter (Signed)
FYI: Called patient. Per patient the Vania Rea was $400. Too expensive. Per patient in reality doesn't want to try any other medicine.Patient noticed that had skipped some of the metformin and was not taking the metformin right. Also patient states in the past 2 months have been really busy, has not been exercising or eating healthy. Patient has started back on eating healthy and states is going to exercise and is keeping a diary of what she is eating and glucose ranges. So far since we called her with the results patient has change the intake of unhealthy foods and her glucose ranges have been in the 100's. Today in the morning it was 92 before breakfast. One of this days it was high at 155 but it was because of the sausage patient has. Patient wants Tawanna Sat to know that it was her fault that her labs were abnormal because was not doing what she supposed to.  Thanks,  -Ethie Curless

## 2015-03-10 MED ORDER — ATORVASTATIN CALCIUM 40 MG PO TABS: 40.0000 mg | ORAL_TABLET | Freq: Every day | ORAL | Status: DC

## 2015-04-30 ENCOUNTER — Telehealth: Payer: Self-pay | Admitting: Physician Assistant

## 2015-04-30 DIAGNOSIS — E119 Type 2 diabetes mellitus without complications: Secondary | ICD-10-CM

## 2015-04-30 MED ORDER — METFORMIN HCL 500 MG PO TABS: 1000.0000 mg | ORAL_TABLET | Freq: Two times a day (BID) | ORAL | Status: DC

## 2015-04-30 MED ORDER — GABAPENTIN 300 MG PO CAPS: 600.0000 mg | ORAL_CAPSULE | Freq: Three times a day (TID) | ORAL | Status: DC

## 2015-04-30 NOTE — Telephone Encounter (Signed)
Please review-aa 

## 2015-04-30 NOTE — Telephone Encounter (Signed)
Metformin and Gabapentin refills sent to [facility].  Thanks!

## 2015-04-30 NOTE — Telephone Encounter (Signed)
Pt advised-aa 

## 2015-04-30 NOTE — Telephone Encounter (Signed)
Pt contacted office for refill request on the following medications:  [facility].  CB#[phone]/MW  metFORMIN (GLUCOPHAGE) 500 MG tablet  gabapentin (NEURONTIN) 300 MG capsule

## 2015-05-05 ENCOUNTER — Telehealth: Payer: Self-pay

## 2015-05-05 DIAGNOSIS — R339 Retention of urine, unspecified: Secondary | ICD-10-CM | POA: Diagnosis not present

## 2015-05-05 DIAGNOSIS — Z9049 Acquired absence of other specified parts of digestive tract: Secondary | ICD-10-CM | POA: Diagnosis not present

## 2015-05-05 DIAGNOSIS — K219 Gastro-esophageal reflux disease without esophagitis: Secondary | ICD-10-CM | POA: Diagnosis not present

## 2015-05-05 DIAGNOSIS — Z8542 Personal history of malignant neoplasm of other parts of uterus: Secondary | ICD-10-CM | POA: Diagnosis not present

## 2015-05-05 DIAGNOSIS — N3289 Other specified disorders of bladder: Secondary | ICD-10-CM | POA: Diagnosis not present

## 2015-05-05 DIAGNOSIS — E119 Type 2 diabetes mellitus without complications: Secondary | ICD-10-CM | POA: Diagnosis not present

## 2015-05-05 DIAGNOSIS — N1330 Unspecified hydronephrosis: Secondary | ICD-10-CM | POA: Diagnosis not present

## 2015-05-05 DIAGNOSIS — Z9221 Personal history of antineoplastic chemotherapy: Secondary | ICD-10-CM | POA: Diagnosis not present

## 2015-05-05 DIAGNOSIS — Z79899 Other long term (current) drug therapy: Secondary | ICD-10-CM | POA: Diagnosis not present

## 2015-05-05 DIAGNOSIS — I10 Essential (primary) hypertension: Secondary | ICD-10-CM | POA: Diagnosis not present

## 2015-05-05 DIAGNOSIS — R109 Unspecified abdominal pain: Secondary | ICD-10-CM | POA: Diagnosis not present

## 2015-05-05 DIAGNOSIS — Z90710 Acquired absence of both cervix and uterus: Secondary | ICD-10-CM | POA: Diagnosis not present

## 2015-05-05 DIAGNOSIS — Z7982 Long term (current) use of aspirin: Secondary | ICD-10-CM | POA: Diagnosis not present

## 2015-05-05 NOTE — Telephone Encounter (Signed)
Jean Gonzales called that she had a little pain yesterday night, went to the bathroom had a bowel movement and urination and she noticed blood on the toilet. Per patient she was going to the bathroom more frequently to urinate.Pain starts on the right side of her stomach and it radiates to the middle all around. Per patient the pain is very sharp that made her teary. She called one of her friend to come see her and patient has vomit 8 times, was able to fall asleep but like around 7 am needed to get up because the pain was there again and she is not able to lay down because it makes the pain worst. Patient has increased her fluids. No back pain or fever. She checked her temperature and it was 97.7. Spoke with Tawanna Sat and Merrill suggest for patient to go to the ER, it can be appendicitis or a Kidney stone and patient might need a CT and other work up. Advised patient and she voice understanding. Patient wanted to know where it was best for to go [facility] or [facility] told the patient we recommend to go  the patient to go to the nearest hospital. Patient was asking about [facility] because they know her cancer history, but with the pain that she is having I still advised the patient to go to the nearest hospital. Will follow-up tomorrow.  Thanks,  -[name]

## 2015-05-06 NOTE — Telephone Encounter (Signed)
Follow-up called spoke with Lorriane Shire she went to the Emergency room at [facility].Was told she has a huge enlarge bladder and kidney was enlarge as well. Patient was not able to urinate well and she said she had already explained to her cancer doctor. Patient was sent home with a cath on. She is going to call Dr Jacqlyn Larsen, Urologist office to schedule today. CAT scan showed no Kidney stones.  Thanks,  -Nuria Phebus

## 2015-05-06 NOTE — Telephone Encounter (Signed)
Noted! Thank you

## 2015-05-12 DIAGNOSIS — N1330 Unspecified hydronephrosis: Secondary | ICD-10-CM | POA: Diagnosis not present

## 2015-05-12 DIAGNOSIS — R339 Retention of urine, unspecified: Secondary | ICD-10-CM | POA: Diagnosis not present

## 2015-05-17 DIAGNOSIS — L4052 Psoriatic arthritis mutilans: Secondary | ICD-10-CM | POA: Diagnosis not present

## 2015-05-17 DIAGNOSIS — L400 Psoriasis vulgaris: Secondary | ICD-10-CM | POA: Diagnosis not present

## 2015-05-19 DIAGNOSIS — Z90722 Acquired absence of ovaries, bilateral: Secondary | ICD-10-CM | POA: Diagnosis not present

## 2015-05-19 DIAGNOSIS — C541 Malignant neoplasm of endometrium: Secondary | ICD-10-CM | POA: Diagnosis not present

## 2015-05-19 DIAGNOSIS — Z9221 Personal history of antineoplastic chemotherapy: Secondary | ICD-10-CM | POA: Diagnosis not present

## 2015-05-19 DIAGNOSIS — Z923 Personal history of irradiation: Secondary | ICD-10-CM | POA: Diagnosis not present

## 2015-05-19 DIAGNOSIS — Z8542 Personal history of malignant neoplasm of other parts of uterus: Secondary | ICD-10-CM | POA: Diagnosis not present

## 2015-05-19 DIAGNOSIS — Z08 Encounter for follow-up examination after completed treatment for malignant neoplasm: Secondary | ICD-10-CM | POA: Diagnosis not present

## 2015-05-19 DIAGNOSIS — Z90710 Acquired absence of both cervix and uterus: Secondary | ICD-10-CM | POA: Diagnosis not present

## 2015-05-19 DIAGNOSIS — N1330 Unspecified hydronephrosis: Secondary | ICD-10-CM | POA: Diagnosis not present

## 2015-05-19 DIAGNOSIS — R87620 Atypical squamous cells of undetermined significance on cytologic smear of vagina (ASC-US): Secondary | ICD-10-CM | POA: Diagnosis not present

## 2015-06-03 ENCOUNTER — Ambulatory Visit (INDEPENDENT_AMBULATORY_CARE_PROVIDER_SITE_OTHER): Payer: Medicare Other | Admitting: Physician Assistant

## 2015-06-03 ENCOUNTER — Encounter: Payer: Self-pay | Admitting: Physician Assistant

## 2015-06-03 VITALS — BP 110/78 | HR 84 | Temp 98.2°F | Resp 16 | Wt 241.0 lb

## 2015-06-03 DIAGNOSIS — N359 Urethral stricture, unspecified: Secondary | ICD-10-CM

## 2015-06-03 DIAGNOSIS — M545 Low back pain: Secondary | ICD-10-CM | POA: Diagnosis not present

## 2015-06-03 DIAGNOSIS — R319 Hematuria, unspecified: Secondary | ICD-10-CM | POA: Diagnosis not present

## 2015-06-03 DIAGNOSIS — N3001 Acute cystitis with hematuria: Secondary | ICD-10-CM

## 2015-06-03 DIAGNOSIS — R39198 Other difficulties with micturition: Secondary | ICD-10-CM

## 2015-06-03 DIAGNOSIS — IMO0002 Reserved for concepts with insufficient information to code with codable children: Secondary | ICD-10-CM

## 2015-06-03 LAB — POCT URINALYSIS DIPSTICK
Bilirubin, UA: NEGATIVE
GLUCOSE UA: NEGATIVE
Ketones, UA: NEGATIVE
Nitrite, UA: NEGATIVE
PH UA: 6
Protein, UA: NEGATIVE
SPEC GRAV UA: 1.01
Urobilinogen, UA: 0.2

## 2015-06-03 MED ORDER — NITROFURANTOIN MONOHYD MACRO 100 MG PO CAPS: 100.0000 mg | ORAL_CAPSULE | Freq: Two times a day (BID) | ORAL | Status: DC

## 2015-06-03 NOTE — Progress Notes (Signed)
Patient ID: Jean Gonzales, female   DOB: 01/04/1956, 60 y.o.   MRN: [id number]       Patient: Jean Gonzales Female    DOB: [birth year]   60 y.o.   MRN: [id number] Visit Date: 06/03/2015  Today's Provider: Mar Daring, PA-C   Chief Complaint  Patient presents with  . Back Pain  . Vaginal Bleeding   Subjective:    HPI Pt is here for vaginal bleeding and lower abdominal and back pain. She reports that she was seen by her cancer doctor in February and he did a biospy of the vaginal wall. She was bleeding after that and he told her that more than likely it was from the procedure, however the bleeding has been intermittent since then and she has been bleeding constantly for the last week. She reports that it is not a lot of blood but enough to be worrisome with her history. She also reports that she has noticed low back pain in the kidney area and RLQ pain. She had a recent admission to [facility] and her kidneys and bladder were distended secondary to urethral stenosis. She had a indwelling catheter for 7-8 days.  She was then seen by Dr. Montel Culver and has a f/u with him on 06/09/15. She has been trying to cath herself but she has not been able to since after initially doing it secondary to pain. She does not have cathter in place at the present time.  She states she has been measuring how much she is voiding to make sure she keeps getting the same amount out, but over the last two days she states she has noticed this decreasing and having more difficult time starting her stream and slowed flow.     Allergies  Allergen Reactions  . Iodinated Diagnostic Agents Anaphylaxis  . Iodine Swelling   Previous Medications   ASPIRIN EC 81 MG TABLET    Take 81 mg by mouth.   ATORVASTATIN (LIPITOR) 40 MG TABLET    Take 1 tablet (40 mg total) by mouth daily.   CELECOXIB (CELEBREX) 100 MG CAPSULE    Take 100 mg by mouth 2 (two) times daily.   EMPAGLIFLOZIN (JARDIANCE) 10 MG TABS TABLET    Take 10 mg by  mouth daily.   GABAPENTIN (NEURONTIN) 300 MG CAPSULE    Take 2 capsules (600 mg total) by mouth 3 (three) times daily.   MAGNESIUM OXIDE 420 MG TABS    Take by mouth.   METFORMIN (GLUCOPHAGE) 500 MG TABLET    Take 2 tablets (1,000 mg total) by mouth 2 (two) times daily with a meal.   MULTIPLE VITAMIN (MULTI-VITAMINS) TABS    Take by mouth.   NAPROXEN SODIUM (ALEVE) 220 MG TABLET    Take 220 mg by mouth.   NEXIUM 20 MG CAPSULE    TAKE ONE CAPSULE BY MOUTH EVERY DAY IF NO RECURRENCE   NYSTATIN (MYCOSTATIN) POWDER    Apply to affected area 3 times daily   TRIAMCINOLONE OINTMENT (KENALOG) 0.1 %    Apply topically 2 (two) times daily. As needed    Review of Systems  Constitutional: Negative.   HENT: Negative.   Eyes: Negative.   Respiratory: Negative.   Cardiovascular: Negative.   Gastrointestinal: Positive for abdominal pain. Negative for nausea and vomiting.  Endocrine: Negative.   Genitourinary: Positive for dysuria, flank pain, decreased urine volume and vaginal bleeding. Negative for frequency, vaginal discharge and vaginal pain.  Musculoskeletal: Positive for back pain.  Skin: Negative.   Allergic/Immunologic: Negative.   Neurological: Negative.   Hematological: Negative.   Psychiatric/Behavioral: Negative.     Social History  Substance Use Topics  . Smoking status: Never Smoker   . Smokeless tobacco: Never Used  . Alcohol Use: No   Objective:   There were no vitals taken for this visit.  Physical Exam  Constitutional: She appears well-developed and well-nourished. No distress.  Cardiovascular: Normal rate, regular rhythm and normal heart sounds.  Exam reveals no gallop and no friction rub.   No murmur heard. Pulmonary/Chest: Effort normal and breath sounds normal. No respiratory distress. She has no wheezes. She has no rales.  Genitourinary: Vagina normal. There is no rash, tenderness, lesion or injury on the right labia. There is no rash, tenderness, lesion or injury on  the left labia. No erythema or bleeding in the vagina. No signs of injury (healed biopsy scars noted) around the vagina. No vaginal discharge found.  Cervix, uterus and ovaries surgically absent Urethral meatus looks like it is source of bleeding.  Small bloody "pink" discharge noted at urethral meatus. Small amount of hematuria on UA. Patient has been unable to self cath secondary to pain and "feels like something is blocking the way"  Skin: She is not diaphoretic.  Vitals reviewed.       Assessment & Plan:     1. Low back pain, unspecified back pain laterality, with sciatica presence unspecified UA positive for small blood.  Trace amount of leukocytes. Patient is requesting referral to [facility] due to location.  However I did tell her to keep her appt with Dr. Montel Culver on 06/09/15 as I did not think we could get her in sooner.  Also advised if symptoms worsen or urine continues to decrease she is to go to the hospital immediately as I feel she is starting to become distended again. She agrees. She did call Dr. Sunnie Nielsen office after leaving to see if he could get her in sooner and they said they could not.  - POCT urinalysis dipstick - Ambulatory referral to Urology  2. Hematuria See above medical treatment plan. - Ambulatory referral to Urology  3. Difficulty voiding See above medical treatment plan. - Ambulatory referral to Urology  4. Acute cystitis with hematuria Will give macrobid as below for leukocytes.  I feel urinary stasis is increasing risk for UTI. She is to call if symptoms worsen. - nitrofurantoin, macrocrystal-monohydrate, (MACROBID) 100 MG capsule; Take 1 capsule (100 mg total) by mouth 2 (two) times daily.  Dispense: 14 capsule; Refill: 0  5. Urethral stenosis See above medical treatment plan.       Mar Daring, PA-C  [facility]

## 2015-06-03 NOTE — Patient Instructions (Addendum)
Hematuria, Adult Hematuria is blood in your urine. It can be caused by a bladder infection, kidney infection, prostate infection, kidney stone, or cancer of your urinary tract. Infections can usually be treated with medicine, and a kidney stone usually will pass through your urine. If neither of these is the cause of your hematuria, further workup to find out the reason may be needed. It is very important that you tell your health care provider about any blood you see in your urine, even if the blood stops without treatment or happens without causing pain. Blood in your urine that happens and then stops and then happens again can be a symptom of a very serious condition. Also, pain is not a symptom in the initial stages of many urinary cancers. HOME CARE INSTRUCTIONS   Drink lots of fluid, 3-4 quarts a day. If you have been diagnosed with an infection, cranberry juice is especially recommended, in addition to large amounts of water.  Avoid caffeine, tea, and carbonated beverages because they tend to irritate the bladder.  Avoid alcohol because it may irritate the prostate.  Take all medicines as directed by your health care provider.  If you were prescribed an antibiotic medicine, finish it all even if you start to feel better.  If you have been diagnosed with a kidney stone, follow your health care provider's instructions regarding straining your urine to catch the stone.  Empty your bladder often. Avoid holding urine for long periods of time.  After a bowel movement, women should cleanse front to back. Use each tissue only once.  Empty your bladder before and after sexual intercourse if you are a female. SEEK MEDICAL CARE IF:  You develop back pain.  You have a fever.  You have a feeling of sickness in your stomach (nausea) or vomiting.  Your symptoms are not better in 3 days. Return sooner if you are getting worse. SEEK IMMEDIATE MEDICAL CARE IF:   You develop severe vomiting and  are unable to keep the medicine down.  You develop severe back or abdominal pain despite taking your medicines.  You begin passing a large amount of blood or clots in your urine.  You feel extremely weak or faint, or you pass out. MAKE SURE YOU:   Understand these instructions.  Will watch your condition.  Will get help right away if you are not doing well or get worse.   This information is not intended to replace advice given to you by your health care provider. Make sure you discuss any questions you have with your health care provider.   Document Released: 03/13/2005 Document Revised: 04/03/2014 Document Reviewed: 11/11/2012 Elsevier Interactive Patient Education 2016 [place].  Acute Urinary Retention, Female Acute urinary retention is the temporary inability to urinate. This is an uncommon problem in women. It can be caused by:  Infection.  A side effect of a medicine.  A problem in a nearby organ that presses or squeezes on the bladder or the urethra (the tube that drains the bladder).  Psychological problems.   Surgery on your bladder, urethra, or pelvic organs that causes obstruction to the outflow of urine from your bladder. HOME CARE INSTRUCTIONS  If you are sent home with a Foley catheter and a drainage system, you will need to discuss the best course of action with your health care provider. While the catheter is in, maintain a good intake of fluids. Keep the drainage bag emptied and lower than your catheter. This is so that  contaminated urine will not flow back into your bladder, which could lead to a urinary tract infection. There are two main types of drainage bags. One is a large bag that usually is used at night. It has a good capacity that will allow you to sleep through the night without having to empty it. The second type is called a leg bag. It has a smaller capacity so it needs to be emptied more frequently. However, the main advantage is that it can be  attached by a leg strap and goes underneath your clothing, allowing you the freedom to move about or leave your home. Only take over-the-counter or prescription medicines for pain, discomfort, or fever as directed by your health care provider.  SEEK MEDICAL CARE IF:  You develop a low-grade fever.  You experience spasms or leakage of urine with the spasms. SEEK IMMEDIATE MEDICAL CARE IF:   You develop chills or fever.  Your catheter stops draining urine.  Your catheter falls out.  You start to develop increased bleeding that does not respond to rest and increased fluid intake. MAKE SURE YOU:  Understand these instructions.  Will watch your condition.  Will get help right away if you are not doing well or get worse.   This information is not intended to replace advice given to you by your health care provider. Make sure you discuss any questions you have with your health care provider.   Document Released: 03/12/2006 Document Revised: 07/28/2014 Document Reviewed: 08/22/2012 Elsevier Interactive Patient Education [employer].

## 2015-06-04 ENCOUNTER — Encounter: Payer: Self-pay | Admitting: Obstetrics and Gynecology

## 2015-06-04 ENCOUNTER — Telehealth: Payer: Self-pay | Admitting: Physician Assistant

## 2015-06-04 ENCOUNTER — Ambulatory Visit (INDEPENDENT_AMBULATORY_CARE_PROVIDER_SITE_OTHER): Payer: Medicare Other | Admitting: Obstetrics and Gynecology

## 2015-06-04 VITALS — Resp 16 | Ht 67.0 in | Wt 239.3 lb

## 2015-06-04 DIAGNOSIS — R109 Unspecified abdominal pain: Secondary | ICD-10-CM

## 2015-06-04 DIAGNOSIS — M069 Rheumatoid arthritis, unspecified: Secondary | ICD-10-CM

## 2015-06-04 DIAGNOSIS — L4050 Arthropathic psoriasis, unspecified: Secondary | ICD-10-CM

## 2015-06-04 LAB — BLADDER SCAN AMB NON-IMAGING

## 2015-06-04 MED ORDER — CELECOXIB 100 MG PO CAPS
100.0000 mg | ORAL_CAPSULE | Freq: Two times a day (BID) | ORAL | Status: DC
Start: 2015-06-04 — End: 2015-11-15

## 2015-06-04 NOTE — Progress Notes (Signed)
06/04/2015 4:17 PM   Jean Gonzales 1955/12/04 [id number]  Referring provider: Mar Daring, PA-C [address] [place], [place] [address]  Chief Complaint  Patient presents with  . Flank Pain  . Hematuria  . Establish Care    HPI: Patient is a 60 year old female with a history of uterine cancer status post hysterctomy, radiation/chemotherapy therapy and long history of urethral stenosis. She was most recently seen by urologist Dr.Ogle with [facility] urology on 05/12/15. She was seen there for a voiding trial after a Foley catheter had been placed for acute episode of urinary retention. During episode of retention she was seen in the emergency department on 05/05/15 for onset of severe flank pain. A CT scan was performed remarkable for a distended bladder with moderate right hydronephrosis and mild left hydronephrosis. Foley catheter was then placed with a large initial output per patient.  Stopped cathing d/t discomfort and noticed some bleeding when wiping after vaginal biopsies.   She was seen by her Gyn/Onc and they did not feel that it was from the biopsies.  She does not perform self catheterizations and over 1 week. She presents today concerned mostly about the bleeding that she has been experiencing.    PMH: Past Medical History  Diagnosis Date  . Arthritis   . Hyperlipidemia   . Hypertension   . Cancer ([place])   . Diabetes mellitus without complication [facility])     Surgical History: Past Surgical History  Procedure Laterality Date  . Abdominal hysterectomy  2015    cancer  . Tonsillectomy  1973  . Cholecystectomy  1992    Home Medications:    Medication List       This list is accurate as of: 06/04/15  4:17 PM.  Always use your most recent med list.               ALEVE 220 MG tablet  Generic drug:  naproxen sodium  Take 220 mg by mouth.     aspirin EC 81 MG tablet  Take 81 mg by mouth.     atorvastatin 40 MG tablet  Commonly known as:   LIPITOR  Take 1 tablet (40 mg total) by mouth daily.     celecoxib 100 MG capsule  Commonly known as:  CELEBREX  Take 1 capsule (100 mg total) by mouth 2 (two) times daily.     empagliflozin 10 MG Tabs tablet  Commonly known as:  JARDIANCE  Take 10 mg by mouth daily.     fluocinonide cream 0.05 %  Commonly known as:  LIDEX  APPLY TO AFFECTED AREA OF LEGS TWICE A DAY UNTIL CLEAR. AVOID FACE, GROIN AND AXILLA     gabapentin 300 MG capsule  Commonly known as:  NEURONTIN  Take 2 capsules (600 mg total) by mouth 3 (three) times daily.     metFORMIN 500 MG tablet  Commonly known as:  GLUCOPHAGE  Take 2 tablets (1,000 mg total) by mouth 2 (two) times daily with a meal.     NEXIUM 20 MG capsule  Generic drug:  esomeprazole  TAKE ONE CAPSULE BY MOUTH EVERY DAY IF NO RECURRENCE     nitrofurantoin (macrocrystal-monohydrate) 100 MG capsule  Commonly known as:  MACROBID  Take 1 capsule (100 mg total) by mouth 2 (two) times daily.     nystatin powder  Commonly known as:  MYCOSTATIN  Apply to affected area 3 times daily     tamsulosin 0.4 MG Caps capsule  Commonly  known as:  FLOMAX  TAKE 1 CAPSULE (0.4 MG TOTAL) BY MOUTH DAILY.     triamcinolone ointment 0.1 %  Commonly known as:  KENALOG  Apply topically 2 (two) times daily. As needed        Allergies:  Allergies  Allergen Reactions  . Iodinated Diagnostic Agents Anaphylaxis  . Iodine Swelling    Family History: Family History  Problem Relation Age of Onset  . Diabetes Mother   . Cancer Mother   . Heart disease Father     Social History:  reports that she has never smoked. She has never used smokeless tobacco. She reports that she does not drink alcohol or use illicit drugs.  ROS: UROLOGY Frequent Urination?: No Hard to postpone urination?: No Burning/pain with urination?: Yes Get up at night to urinate?: Yes Leakage of urine?: No Urine stream starts and stops?: No Trouble starting stream?: No Do you have to  strain to urinate?: No Blood in urine?: Yes Urinary tract infection?: No Sexually transmitted disease?: No Injury to kidneys or bladder?: Yes Painful intercourse?: No Weak stream?: No Currently pregnant?: No Vaginal bleeding?: No Last menstrual period?: n  Gastrointestinal Nausea?: No Vomiting?: No Indigestion/heartburn?: No Diarrhea?: No Constipation?: No  Constitutional Fever: No Night sweats?: No Weight loss?: No Fatigue?: No  Skin Skin rash/lesions?: No Itching?: No  Eyes Blurred vision?: No Double vision?: No  Ears/Nose/Throat Sore throat?: No Sinus problems?: No  Hematologic/Lymphatic Swollen glands?: No Easy bruising?: No  Cardiovascular Leg swelling?: No Chest pain?: No  Respiratory Cough?: No Shortness of breath?: No  Endocrine Excessive thirst?: No  Musculoskeletal Back pain?: Yes Joint pain?: Yes  Neurological Headaches?: No Dizziness?: No  Psychologic Depression?: No Anxiety?: No  Physical Exam: Resp 16  Ht 5' 7"  (1.702 m)  Wt 239 lb 4.8 oz (108.546 kg)  BMI 37.47 kg/m2  Constitutional:  Alert and oriented, No acute distress. HEENT: [place] AT, moist mucus membranes.  Trachea midline, no masses. Cardiovascular: No clubbing, cyanosis, or edema. Respiratory: Normal respiratory effort, no increased work of breathing. GI: Abdomen is soft, nontender, nondistended, no abdominal masses GU: No CVA tenderness.  Pelvic: atrophic introitus and urinary meatus,  +urethral caruncle, no obvious cystocele, uterus and cervix surgically absent Skin: No rashes, bruises or suspicious lesions. Lymph: No cervical or inguinal adenopathy. Neurologic: Grossly intact, no focal deficits, moving all 4 extremities. Psychiatric: Normal mood and affect.  Laboratory Data:  Lab Results  Component Value Date   WBC 9.2 01/25/2007   HGB 14.3 01/25/2007   HCT 42.0 01/25/2007   MCV 86.8 01/25/2007   PLT 307 01/25/2007    Lab Results  Component Value Date    CREATININE 0.7 01/25/2007    No results found for: PSA  No results found for: TESTOSTERONE  Lab Results  Component Value Date   HGBA1C 7.4* 03/02/2015    Urinalysis    Component Value Date/Time   COLORURINE YELLOW 01/25/2007 [place] 01/25/2007 1533   LABSPEC 1.011 01/25/2007 1533   PHURINE 6.5 01/25/2007 1533   [place] 01/25/2007 1533   [place] 01/25/2007 1533   BILIRUBINUR neg 06/03/2015 1420   [place] 01/25/2007 1533   KETONESUR 15* 01/25/2007 1533   PROTEINUR neg 06/03/2015 1420   PROTEINUR NEGATIVE 01/25/2007 1533   UROBILINOGEN 0.2 06/03/2015 1420   UROBILINOGEN 0.2 01/25/2007 1533   NITRITE neg 06/03/2015 1420   NITRITE NEGATIVE 01/25/2007 1533   LEUKOCYTESUR Trace* 06/03/2015 1420    Pertinent Imaging: EXAM: CT  abdomen and pelvis without contrast DATE: 05/05/15 17:53:30 ACCESSION: [id number] UN DICTATED: 05/05/15 17:55:23 INTERPRETATION LOCATION: [place]  CLINICAL INDICATION: 60 Year Old (F): CALCULUS OF KIDNEY  COMPARISON: Abdominal MR 12/17/13  TECHNIQUE: A spiral CT scan was obtained without IV contrast from the top of the kidneys through the entire bladder.  Images were reconstructed in the axial plane. Coronal and sagittal reformatted images were also provided for further evaluation.  FINDINGS:  VISUALIZED CHEST: Bibasilar atelectasis. Visualized heart is within normal limits.  ABDOMEN/PELVIS: Liver: Diffuse steatosis.  Gallbladder: Surgically absent.  Spleen: Unremarkable.  Pancreas: Unremarkable.  Adrenal glands: Unremarkable. Kidneys: Moderate right hydronephrosis and perinephric stranding. Mild left hydronephrosis. No renal calculi identified.  The bladder is distended.  Bowel/Peritoneum: No free intraperitoneal gas. The large and small bowel are unremarkable. The appendix is not visualized, however there are no secondary signs of appendicitis.  There is no evidence of bowel  obstruction.  Pelvis: The uterus and ovaries are surgically absent. Metallic surgical clips are present. No free fluid.  Vasculature: The aorta is normal in size and contour. Lack of IV contrast limits evaluation.   Lymph Nodes: There is no abdominal or pelvic lymphadenopathy by size criteria.   Soft Tissue/Bones: There are no acute osseous abnormalities or focal osseous lesions.  IMPRESSION: - No urinary calculi identified.  - Marked bladder distention with moderate right hydronephrosis and mild left hydronephrosis.  Results for orders placed or performed in visit on 06/04/15  BLADDER SCAN AMB NON-IMAGING  Result Value Ref Range   Scan Result 246 mL     Assessment & Plan:    1. Incomplete bladder emptying/urinary retention- Attempted straight cath the patient today and met significant resistance mid urethra. I was unable to pass the catheter into her bladder. Minimal bright red bleeding was noted from urethral meatus after failed attempts at catheterization in office today. I advised patient to begin scheduled voiding at least once every 2-3 hours to keep her bladder empty as possible. PVR moderatedly elevated at 255m today but patient is asymptomatic. She has not catheterized in over 1 week. Return precautions reviewed. [facility] She would like to keep his appointment and then follow up with [place] for [place]. She understands to his immediate medical attention should she become unable to void or begin to experience lower abdominal or flank pain. - Urinalysis, Complete - BLADDER SCAN AMB NON-IMAGING  2. Gross hematuria- Marked bladder distention with moderate right hydronephrosis and mild left hydronephrosis. Otherwise no abnormalities seen on CT scan without contrast. Patient has an anaphylactic reaction to contrast dye. Differential includes radiation cystitis versus urethral trauma from self catheterizations through a  stenotic atrophic urethra and/or bleeding urethral caruncle. -Schedule cystoscopy.   Return for schedule cystoscopy.  These notes generated with voice recognition software. I apologize for typographical errors.  LHerbert Moors [facility] [address] [place] [phone]

## 2015-06-04 NOTE — Telephone Encounter (Signed)
Ok great! I am so glad she was able to be seen! I will refill celebrex.

## 2015-06-04 NOTE — Telephone Encounter (Signed)
Patient needs a refill for celecoxib (CELEBREX) 100 MG capsule [facility] [place].  Patient is out of this medication.  Patient wanted to let you know that she was able to be seen at [facility] today and they set her up for a Cystoscopy on 06/18/2015.

## 2015-06-05 LAB — URINALYSIS, COMPLETE
Bilirubin, UA: NEGATIVE
GLUCOSE, UA: NEGATIVE
Ketones, UA: NEGATIVE
NITRITE UA: NEGATIVE
Protein, UA: NEGATIVE
Specific Gravity, UA: 1.01 (ref 1.005–1.030)
UUROB: 0.2 mg/dL (ref 0.2–1.0)
pH, UA: 6 (ref 5.0–7.5)

## 2015-06-05 LAB — MICROSCOPIC EXAMINATION

## 2015-06-09 DIAGNOSIS — K760 Fatty (change of) liver, not elsewhere classified: Secondary | ICD-10-CM | POA: Diagnosis not present

## 2015-06-09 DIAGNOSIS — R339 Retention of urine, unspecified: Secondary | ICD-10-CM | POA: Diagnosis not present

## 2015-06-09 DIAGNOSIS — N1330 Unspecified hydronephrosis: Secondary | ICD-10-CM | POA: Diagnosis not present

## 2015-06-09 DIAGNOSIS — R9341 Abnormal radiologic findings on diagnostic imaging of renal pelvis, ureter, or bladder: Secondary | ICD-10-CM | POA: Diagnosis not present

## 2015-06-11 ENCOUNTER — Other Ambulatory Visit: Payer: Self-pay | Admitting: Physician Assistant

## 2015-06-11 DIAGNOSIS — K219 Gastro-esophageal reflux disease without esophagitis: Secondary | ICD-10-CM

## 2015-06-15 ENCOUNTER — Telehealth: Payer: Self-pay | Admitting: Urology

## 2015-06-15 NOTE — Telephone Encounter (Signed)
Patient cancelled her cysto said that she went to her other urologist and they said she did not need to have a cysto and that she was perfectly fine and did not need to be seen by [place] anymore.  Jean Gonzales

## 2015-06-18 ENCOUNTER — Other Ambulatory Visit: Payer: Medicare Other

## 2015-08-16 DIAGNOSIS — Z452 Encounter for adjustment and management of vascular access device: Secondary | ICD-10-CM | POA: Diagnosis not present

## 2015-08-16 DIAGNOSIS — Z79899 Other long term (current) drug therapy: Secondary | ICD-10-CM | POA: Diagnosis not present

## 2015-08-16 DIAGNOSIS — C541 Malignant neoplasm of endometrium: Secondary | ICD-10-CM | POA: Diagnosis not present

## 2015-08-16 DIAGNOSIS — R87620 Atypical squamous cells of undetermined significance on cytologic smear of vagina (ASC-US): Secondary | ICD-10-CM | POA: Diagnosis not present

## 2015-08-17 ENCOUNTER — Encounter: Payer: Self-pay | Admitting: Physician Assistant

## 2015-08-24 ENCOUNTER — Ambulatory Visit: Payer: Medicare Other | Admitting: Physician Assistant

## 2015-09-08 ENCOUNTER — Telehealth: Payer: Self-pay | Admitting: Gastroenterology

## 2015-09-08 NOTE — Telephone Encounter (Signed)
colonoscopy

## 2015-09-10 NOTE — Telephone Encounter (Signed)
LVM for pt to return my call.

## 2015-10-05 NOTE — Telephone Encounter (Signed)
Called patient and left a vm. Appointment not scheduled

## 2015-10-08 NOTE — Telephone Encounter (Signed)
Sent patient a notification letter to their address on file

## 2015-10-20 ENCOUNTER — Telehealth: Payer: Self-pay | Admitting: Physician Assistant

## 2015-10-20 DIAGNOSIS — E119 Type 2 diabetes mellitus without complications: Secondary | ICD-10-CM

## 2015-10-20 MED ORDER — METFORMIN HCL 500 MG PO TABS: 1000.0000 mg | ORAL_TABLET | Freq: Two times a day (BID) | ORAL | 6 refills | Status: DC

## 2015-10-20 NOTE — Telephone Encounter (Signed)
Pt is requesting refill on Metformin 500 MG.She would like [facility] [place]

## 2015-10-20 NOTE — Telephone Encounter (Signed)
Refill sent.

## 2015-10-25 ENCOUNTER — Encounter: Payer: Self-pay | Admitting: Physician Assistant

## 2015-10-25 ENCOUNTER — Ambulatory Visit (INDEPENDENT_AMBULATORY_CARE_PROVIDER_SITE_OTHER): Payer: Medicare Other | Admitting: Physician Assistant

## 2015-10-25 VITALS — BP 122/80 | HR 79 | Temp 98.0°F | Resp 16 | Wt 243.8 lb

## 2015-10-25 DIAGNOSIS — E785 Hyperlipidemia, unspecified: Secondary | ICD-10-CM

## 2015-10-25 DIAGNOSIS — I10 Essential (primary) hypertension: Secondary | ICD-10-CM

## 2015-10-25 DIAGNOSIS — E1142 Type 2 diabetes mellitus with diabetic polyneuropathy: Secondary | ICD-10-CM

## 2015-10-25 LAB — POCT GLYCOSYLATED HEMOGLOBIN (HGB A1C)
Est. average glucose Bld gHb Est-mCnc: 140
HEMOGLOBIN A1C: 6.5

## 2015-10-25 NOTE — Patient Instructions (Signed)
Diabetes and Standards of Medical Care Diabetes is complicated. You may find that your diabetes team includes a dietitian, nurse, diabetes educator, eye doctor, and more. To help everyone know what is going on and to help you get the care you deserve, the following schedule of care was developed to help keep you on track. Below are the tests, exams, vaccines, medicines, education, and plans you will need. HbA1c test This test shows how well you have controlled your glucose over the past 2-3 months. It is used to see if your diabetes management plan needs to be adjusted.   It is performed at least 2 times a year if you are meeting treatment goals.  It is performed 4 times a year if therapy has changed or if you are not meeting treatment goals. Blood pressure test  This test is performed at every routine medical visit. The goal is less than 140/90 mm Hg for most people, but 130/80 mm Hg in some cases. Ask your health care provider about your goal. Dental exam  Follow up with the dentist regularly. Eye exam  If you are diagnosed with type 1 diabetes as a child, get an exam upon reaching the age of 10 years or older and having had diabetes for 3-5 years. Yearly eye exams are recommended after that initial eye exam.  If you are diagnosed with type 1 diabetes as an adult, get an exam within 5 years of diagnosis and then yearly.  If you are diagnosed with type 2 diabetes, get an exam as soon as possible after the diagnosis and then yearly. Foot care exam  Visual foot exams are performed at every routine medical visit. The exams check for cuts, injuries, or other problems with the feet.  You should have a complete foot exam performed every year. This exam includes an inspection of the structure and skin of your feet, a check of the pulses in your feet, and a check of the sensation in your feet.  Type 1 diabetes: The first exam is performed 5 years after diagnosis.  Type 2 diabetes: The first  exam is performed at the time of diagnosis.  Check your feet nightly for cuts, injuries, or other problems with your feet. Tell your health care provider if anything is not healing. Kidney function test (urine microalbumin)  This test is performed once a year.  Type 1 diabetes: The first test is performed 5 years after diagnosis.  Type 2 diabetes: The first test is performed at the time of diagnosis.  A serum creatinine and estimated glomerular filtration rate (eGFR) test is done once a year to assess the level of chronic kidney disease (CKD), if present. Lipid profile (cholesterol, HDL, LDL, triglycerides)  Performed every 5 years for most people.  The goal for LDL is less than 100 mg/dL. If you are at high risk, the goal is less than 70 mg/dL.  The goal for HDL is 40 mg/dL-50 mg/dL for men and 50 mg/dL-60 mg/dL for women. An HDL cholesterol of 60 mg/dL or higher gives some protection against heart disease.  The goal for triglycerides is less than 150 mg/dL. Immunizations  The flu (influenza) vaccine is recommended yearly for every person 6 months of age or older who has diabetes.  The pneumonia (pneumococcal) vaccine is recommended for every person 2 years of age or older who has diabetes. Adults 65 years of age or older may receive the pneumonia vaccine as a series of two separate shots.  The hepatitis B   vaccine is recommended for adults shortly after they have been diagnosed with diabetes.  The Tdap (tetanus, diphtheria, and pertussis) vaccine should be given:  According to normal childhood vaccination schedules, for children.  Every 10 years, for adults who have diabetes. Diabetes self-management education  Education is recommended at diagnosis and ongoing as needed. Treatment plan  Your treatment plan is reviewed at every medical visit.   This information is not intended to replace advice given to you by your health care provider. Make sure you discuss any questions you  have with your health care provider.   Document Released: 01/08/2009 Document Revised: 04/03/2014 Document Reviewed: 08/13/2012 Elsevier Interactive Patient Education 2016 [employer]  

## 2015-10-25 NOTE — Progress Notes (Signed)
Patient: Jean Gonzales Female    DOB: [birth year]   60 y.o.   MRN: [id number] Visit Date: 10/25/2015  Today's Provider: Mar Daring, PA-C   Chief Complaint  Patient presents with  . Follow-up    Diabetes   Subjective:    HPI  Diabetes Mellitus Type II, Follow-up:   Lab Results  Component Value Date   HGBA1C 7.4 (H) 03/02/2015    Last seen for diabetes 6 months ago.  Management since then includes None. She reports excellent compliance with treatment. She is not having side effects.  Current symptoms include none and have been stable. Home blood sugar records: 130's-120's  Episodes of hypoglycemia? yes    Most Recent Eye Exam: due now; had one in 2016. Weight trend: stable Current diet: in general, a "healthy" diet   Current exercise: none  Pertinent Labs:    Component Value Date/Time   CHOL 152 03/02/2015 1043   TRIG 223 (H) 03/02/2015 1043   HDL 33 (L) 03/02/2015 1043   LDLCALC 74 03/02/2015 1043   CREATININE 0.7 01/25/2007 1411    Wt Readings from Last 3 Encounters:  10/25/15 243 lb 12.8 oz (110.6 kg)  06/04/15 239 lb 4.8 oz (108.5 kg)  06/03/15 241 lb (109.3 kg)    ------------------------------------------------------------------------     Allergies  Allergen Reactions  . Iodinated Diagnostic Agents Anaphylaxis  . Iodine Swelling   Current Meds  Medication Sig  . aspirin EC 81 MG tablet Take 81 mg by mouth.  Marland Kitchen atorvastatin (LIPITOR) 40 MG tablet Take 1 tablet (40 mg total) by mouth daily.  . celecoxib (CELEBREX) 100 MG capsule Take 1 capsule (100 mg total) by mouth 2 (two) times daily.  Marland Kitchen esomeprazole (NEXIUM) 20 MG capsule TAKE ONE CAPSULE BY MOUTH EVERY DAY IF NO RECURRENCE  . fluocinonide cream (LIDEX) 0.05 % APPLY TO AFFECTED AREA OF LEGS TWICE A DAY UNTIL CLEAR. AVOID FACE, GROIN AND AXILLA  . gabapentin (NEURONTIN) 300 MG capsule Take 2 capsules (600 mg total) by mouth 3 (three) times daily.  . metFORMIN (GLUCOPHAGE)  500 MG tablet Take 2 tablets (1,000 mg total) by mouth 2 (two) times daily with a meal.  . nitrofurantoin, macrocrystal-monohydrate, (MACROBID) 100 MG capsule Take 1 capsule (100 mg total) by mouth 2 (two) times daily.  Marland Kitchen nystatin (MYCOSTATIN) powder Apply to affected area 3 times daily  . tamsulosin (FLOMAX) 0.4 MG CAPS capsule TAKE 1 CAPSULE (0.4 MG TOTAL) BY MOUTH DAILY.  Marland Kitchen triamcinolone ointment (KENALOG) 0.1 % Apply topically 2 (two) times daily. As needed    Review of Systems  Constitutional: Negative.   Respiratory: Negative.   Cardiovascular: Negative.   Gastrointestinal: Negative.   Genitourinary: Negative.   Musculoskeletal: Negative for back pain.  Neurological: Negative.   Psychiatric/Behavioral: Negative.     Social History  Substance Use Topics  . Smoking status: Never Smoker  . Smokeless tobacco: Never Used  . Alcohol use No   Objective:   BP 122/80 (BP Location: Left Arm, Patient Position: Sitting, Cuff Size: Large)   Pulse 79   Temp 98 F (36.7 C) (Oral)   Resp 16   Wt 243 lb 12.8 oz (110.6 kg)   BMI 38.18 kg/m   Physical Exam  Constitutional: She appears well-developed and well-nourished. No distress.  Neck: Normal range of motion. Neck supple. No JVD present. No tracheal deviation present. No thyromegaly present.  Cardiovascular: Normal rate, regular rhythm and normal heart sounds.  Exam  reveals no gallop and no friction rub.   No murmur heard. Pulmonary/Chest: Effort normal and breath sounds normal. No respiratory distress. She has no wheezes. She has no rales.  Musculoskeletal: She exhibits no edema.  Lymphadenopathy:    She has no cervical adenopathy.  Skin: She is not diaphoretic.  Vitals reviewed.      Assessment & Plan:     1. Type 2 diabetes mellitus with diabetic polyneuropathy, without long-term current use of insulin (HCC) HgBA1c is back down to 6.5. She is to continue lifestyle modifications. Will decrease metformin back to 500mg  BID  and recheck in 6 months. Will decrease due to her having a hypoglycemic episode about once per week of recent. Will check other labs as below. I will see her back in 6 months to recheck labs. - POCT glycosylated hemoglobin (Hb A1C) - Microalbumin / creatinine urine ratio - CBC with Differential - Comprehensive Metabolic Panel (CMET) - metFORMIN (GLUCOPHAGE) 500 MG tablet; Take 1 tablet (500 mg total) by mouth 2 (two) times daily with a meal.  Dispense: 60 tablet; Refill: 6  2. Essential hypertension Stable. Continue current medical treatment plan. Will check labs as below and f/u pending results. I will recheck BP and labs in 6 months. - CBC with Differential - Comprehensive Metabolic Panel (CMET)  3. HLD (hyperlipidemia) Stable. Continue current medical treatment plan. Will check labs as below and f/u pending results. We will recheck in 6 months. - Lipid Profile       Mar Daring, PA-C  [facility]

## 2015-10-26 ENCOUNTER — Telehealth: Payer: Self-pay

## 2015-10-26 LAB — CBC WITH DIFFERENTIAL/PLATELET
BASOS: 0 %
Basophils Absolute: 0 10*3/uL (ref 0.0–0.2)
EOS (ABSOLUTE): 0.2 10*3/uL (ref 0.0–0.4)
EOS: 2 %
HEMATOCRIT: 39.4 % (ref 34.0–46.6)
Hemoglobin: 12.7 g/dL (ref 11.1–15.9)
IMMATURE GRANULOCYTES: 0 %
Immature Grans (Abs): 0 10*3/uL (ref 0.0–0.1)
LYMPHS ABS: 3.1 10*3/uL (ref 0.7–3.1)
Lymphs: 31 %
MCH: 29.4 pg (ref 26.6–33.0)
MCHC: 32.2 g/dL (ref 31.5–35.7)
MCV: 91 fL (ref 79–97)
MONOS ABS: 0.6 10*3/uL (ref 0.1–0.9)
Monocytes: 6 %
NEUTROS ABS: 6 10*3/uL (ref 1.4–7.0)
NEUTROS PCT: 61 %
PLATELETS: 239 10*3/uL (ref 150–379)
RBC: 4.32 x10E6/uL (ref 3.77–5.28)
RDW: 13.7 % (ref 12.3–15.4)
WBC: 10 10*3/uL (ref 3.4–10.8)

## 2015-10-26 LAB — MICROALBUMIN / CREATININE URINE RATIO
CREATININE, UR: 107.8 mg/dL
MICROALB/CREAT RATIO: 11.1 mg/g{creat} (ref 0.0–30.0)
Microalbumin, Urine: 12 ug/mL

## 2015-10-26 LAB — COMPREHENSIVE METABOLIC PANEL
A/G RATIO: 1.7 (ref 1.2–2.2)
ALT: 31 IU/L (ref 0–32)
AST: 34 IU/L (ref 0–40)
Albumin: 4.5 g/dL (ref 3.6–4.8)
Alkaline Phosphatase: 129 IU/L — ABNORMAL HIGH (ref 39–117)
BILIRUBIN TOTAL: 0.4 mg/dL (ref 0.0–1.2)
BUN/Creatinine Ratio: 12 (ref 12–28)
BUN: 11 mg/dL (ref 8–27)
CALCIUM: 9.4 mg/dL (ref 8.7–10.3)
CHLORIDE: 103 mmol/L (ref 96–106)
CO2: 21 mmol/L (ref 18–29)
Creatinine, Ser: 0.91 mg/dL (ref 0.57–1.00)
GFR calc Af Amer: 79 mL/min/{1.73_m2} (ref >59)
GFR, EST NON AFRICAN AMERICAN: 68 mL/min/{1.73_m2} (ref >59)
GLOBULIN, TOTAL: 2.7 g/dL (ref 1.5–4.5)
Glucose: 95 mg/dL (ref 65–99)
POTASSIUM: 4.3 mmol/L (ref 3.5–5.2)
SODIUM: 144 mmol/L (ref 134–144)
Total Protein: 7.2 g/dL (ref 6.0–8.5)

## 2015-10-26 LAB — LIPID PANEL
CHOL/HDL RATIO: 4.2 ratio (ref 0.0–4.4)
Cholesterol, Total: 150 mg/dL (ref 100–199)
HDL: 36 mg/dL — AB (ref >39)
LDL Calculated: 66 mg/dL (ref 0–99)
TRIGLYCERIDES: 242 mg/dL — AB (ref 0–149)
VLDL Cholesterol Cal: 48 mg/dL — ABNORMAL HIGH (ref 5–40)

## 2015-10-26 NOTE — Telephone Encounter (Signed)
Patient advised as directed below. Voiced understanding.  Thanks,  -[name] 

## 2015-10-26 NOTE — Telephone Encounter (Signed)
-----   Message from Mar Daring, [place] sent at 10/26/2015  2:18 PM EDT ----- All labs are within normal limits and stable.  Triglycerides are still elevated but total cholesterol and LDL are good. Make sure to limit fatty foods and processed foods from diet to lower triglycerides. This is the cholesterol portion that will tell on your dieting habits and is most affected by diet. Thanks! -JB

## 2015-11-15 ENCOUNTER — Other Ambulatory Visit: Payer: Self-pay | Admitting: Physician Assistant

## 2015-11-15 DIAGNOSIS — M069 Rheumatoid arthritis, unspecified: Secondary | ICD-10-CM

## 2015-11-15 DIAGNOSIS — L4050 Arthropathic psoriasis, unspecified: Secondary | ICD-10-CM

## 2016-02-01 ENCOUNTER — Telehealth: Payer: Self-pay | Admitting: Physician Assistant

## 2016-02-01 DIAGNOSIS — Z1239 Encounter for other screening for malignant neoplasm of breast: Secondary | ICD-10-CM

## 2016-02-01 NOTE — Telephone Encounter (Signed)
Pt calling requesting for a order to be put in at the [facility] at [facility] [place] to get her mammogram. Pt states she called the breast center and was told to call her PCP and get an order put in for it.  Thanks CC

## 2016-02-01 NOTE — Telephone Encounter (Signed)
I put the order in

## 2016-02-14 ENCOUNTER — Telehealth: Payer: Self-pay | Admitting: Physician Assistant

## 2016-02-14 DIAGNOSIS — Z1239 Encounter for other screening for malignant neoplasm of breast: Secondary | ICD-10-CM

## 2016-02-14 NOTE — Telephone Encounter (Signed)
Order for diagnostic mammogram ordered

## 2016-02-14 NOTE — Telephone Encounter (Signed)
Per York Cerise at [place] pt will need a diagnostic bilateral mammogram to f/u from abnormal mammogram done in 2015.Pt would like a call back with appointment

## 2016-02-14 NOTE — Telephone Encounter (Signed)
Please review. KW 

## 2016-02-15 NOTE — Telephone Encounter (Signed)
Appointment scheduled at [facility] 03/21/16 at 2:20

## 2016-03-21 ENCOUNTER — Other Ambulatory Visit: Payer: Self-pay | Admitting: Physician Assistant

## 2016-03-21 ENCOUNTER — Ambulatory Visit
Admission: RE | Admit: 2016-03-21 | Discharge: 2016-03-21 | Disposition: A | Discharge status: Home / Self Care | Payer: Medicare Other | Source: Ambulatory Visit | Attending: Physician Assistant | Admitting: Physician Assistant

## 2016-03-21 DIAGNOSIS — R928 Other abnormal and inconclusive findings on diagnostic imaging of breast: Secondary | ICD-10-CM

## 2016-03-21 DIAGNOSIS — Z1239 Encounter for other screening for malignant neoplasm of breast: Secondary | ICD-10-CM

## 2016-03-21 DIAGNOSIS — K219 Gastro-esophageal reflux disease without esophagitis: Secondary | ICD-10-CM

## 2016-03-22 ENCOUNTER — Telehealth: Payer: Self-pay

## 2016-03-22 NOTE — Telephone Encounter (Signed)
-----   Message from Mar Daring, [place] sent at 03/21/2016  4:37 PM EST ----- Normal and stable mammogram. Repeat screening in one year.

## 2016-03-22 NOTE — Telephone Encounter (Signed)
LMTCB  Thanks,  -Jahnasia Tatum 

## 2016-03-22 NOTE — Telephone Encounter (Signed)
Patient advised as below. Patient reports that she was advised of normal results at [facility] .

## 2016-04-14 ENCOUNTER — Other Ambulatory Visit: Payer: Self-pay | Admitting: Physician Assistant

## 2016-04-14 DIAGNOSIS — E7800 Pure hypercholesterolemia, unspecified: Secondary | ICD-10-CM

## 2016-04-25 ENCOUNTER — Ambulatory Visit: Payer: Medicare Other | Admitting: Physician Assistant

## 2016-04-26 ENCOUNTER — Ambulatory Visit (INDEPENDENT_AMBULATORY_CARE_PROVIDER_SITE_OTHER): Payer: Medicare Other | Admitting: Physician Assistant

## 2016-04-26 ENCOUNTER — Encounter: Payer: Self-pay | Admitting: Physician Assistant

## 2016-04-26 VITALS — BP 120/86 | HR 67 | Temp 97.8°F | Resp 16 | Wt 237.0 lb

## 2016-04-26 DIAGNOSIS — E7800 Pure hypercholesterolemia, unspecified: Secondary | ICD-10-CM

## 2016-04-26 DIAGNOSIS — E1142 Type 2 diabetes mellitus with diabetic polyneuropathy: Secondary | ICD-10-CM

## 2016-04-26 LAB — POCT GLYCOSYLATED HEMOGLOBIN (HGB A1C)
Est. average glucose Bld gHb Est-mCnc: 137
Hemoglobin A1C: 6.4

## 2016-04-26 NOTE — Patient Instructions (Signed)
Diabetic Ketoacidosis °Diabetic ketoacidosis is a life-threatening complication of diabetes. If it is not treated, it can cause severe dehydration and organ damage and can lead to a coma or death. °What are the causes? °This condition develops when there is not enough of the hormone insulin in the body. Insulin helps the body to break down sugar for energy. Without insulin, the body cannot break down sugar, so it breaks down fats instead. This leads to the production of acids that are called ketones. Ketones are poisonous at high levels. °This condition can be triggered by: °· Stress on the body that is brought on by an illness. °· Medicines that raise blood glucose levels. °· Not taking diabetes medicine. ° °What are the signs or symptoms? °Symptoms of this condition include: °· Fatigue. °· Weight loss. °· Excessive thirst. °· Light-headedness. °· Fruity or sweet-smelling breath. °· Excessive urination. °· Vision changes. °· Confusion or irritability. °· Nausea. °· Vomiting. °· Rapid breathing. °· Abdominal pain. °· Feeling flushed. ° °How is this diagnosed? °This condition is diagnosed based on a medical history, a physical exam, and blood tests. You may also have a urine test that checks for ketones. °How is this treated? °This condition may be treated with: °· Fluid replacement. This may be done to correct dehydration. °· Insulin injections. These may be given through the skin or through an IV tube. °· Electrolyte replacement. Electrolytes, such as potassium and sodium, may be given in pill form or through an IV tube. °· Antibiotic medicines. These may be prescribed if your condition was caused by an infection. ° °Follow these instructions at home: °Eating and drinking °· Drink enough fluids to keep your urine clear or pale yellow. °· If you cannot eat, alternate between drinking fluids with sugar (such as juice) and salty fluids (such as broth or bouillon). °· If you can eat, follow your usual diet and drink  sugar-free liquids, such as water. °Other Instructions ° °· Take insulin as directed by your health care provider. Do not skip insulin injections. Do not use expired insulin. °· If your blood sugar is over 240 mg/dL, monitor your urine ketones every 4-6 hours. °· If you were prescribed an antibiotic medicine, finish all of it even if you start to feel better. °· Rest and exercise only as directed by your health care provider. °· If you get sick, call your health care provider and begin treatment quickly. Your body often needs extra insulin to fight an illness. °· Check your blood glucose levels regularly. If your blood glucose is high, drink plenty of fluids. This helps to flush out ketones. °Contact a health care provider if: °· Your blood glucose level is too high or too low. °· You have ketones in your urine. °· You have a fever. °· You cannot eat. °· You cannot tolerate fluids. °· You have been vomiting for more than 2 hours. °· You continue to have symptoms of this condition. °· You develop new symptoms. °Get help right away if: °· Your blood glucose levels continue to be high (elevated). °· Your monitor reads “high” even when you are taking insulin. °· You faint. °· You have chest pain. °· You have trouble breathing. °· You have a sudden, severe headache. °· You have sudden weakness in one arm or one leg. °· You have sudden trouble speaking or swallowing. °· You have vomiting or diarrhea that gets worse after 3 hours. °· You feel severely fatigued. °· You have trouble thinking. °· You   have abdominal pain. °· You are severely dehydrated. Symptoms of severe dehydration include: °? Extreme thirst. °? Dry mouth. °? Blue lips. °? Cold hands and feet. °? Rapid breathing. °This information is not intended to replace advice given to you by your health care provider. Make sure you discuss any questions you have with your health care provider. °Document Released: 03/10/2000 Document Revised: 08/19/2015 Document  Reviewed: 02/18/2014 °Elsevier Interactive Patient Education © 2017 [employer] ° °

## 2016-04-26 NOTE — Progress Notes (Signed)
Patient: Jean Gonzales Female    DOB: August 25, 1955   61 y.o.   MRN: [id number] Visit Date: 04/26/2016  Today's Provider: Mar Daring, PA-C   Chief Complaint  Patient presents with  . Diabetes  . Hyperlipidemia   Subjective:    HPI  Diabetes Mellitus Type II, Follow-up:   Lab Results  Component Value Date   HGBA1C 6.4 04/26/2016   HGBA1C 6.5 10/25/2015   HGBA1C 7.4 (H) 03/02/2015    Last seen for diabetes 6 months ago.  Management since then includes decrease. She reports excellent compliance with treatment. She is not having side effects.  Current symptoms include paresthesia of the feet and have been stable. Patient reports that she stopped taking Gabapentin 4 months ago and has been using oils and reports moderate improvement on oil. Home blood sugar records: fasting range: below 130  Episodes of hypoglycemia? no   Current Insulin Regimen: none Most Recent Eye Exam: UTD Weight trend: decreasing steadily Prior visit with dietician: no Current diet: in general, a "healthy" diet   Current exercise: none  Pertinent Labs:    Component Value Date/Time   CHOL 150 10/25/2015 1246   TRIG 242 (H) 10/25/2015 1246   HDL 36 (L) 10/25/2015 1246   LDLCALC 66 10/25/2015 1246   CREATININE 0.91 10/25/2015 1246    Wt Readings from Last 3 Encounters:  04/26/16 237 lb (107.5 kg)  10/25/15 243 lb 12.8 oz (110.6 kg)  06/04/15 239 lb 4.8 oz (108.5 kg)    ------------------------------------------------------------------------   Lipid/Cholesterol, Follow-up:   Last seen for this6 months ago.  Management changes since that visit include no changes. Patient reports she stopped taking atorvastatin about 4 months ago. Patient reports she started KETO diet on 03/27/2016. Marland Kitchen Last Lipid Panel:    Component Value Date/Time   CHOL 150 10/25/2015 1246   TRIG 242 (H) 10/25/2015 1246   HDL 36 (L) 10/25/2015 1246   CHOLHDL 4.2 10/25/2015 1246   Shelly 66  10/25/2015 1246    Risk factors for vascular disease include diabetes mellitus, hypercholesterolemia and hypertension  She reports poor compliance with treatment. She is not having side effects.  Current symptoms include none and have been stable. Weight trend: decreasing steadily Prior visit with dietician: no Current diet: in general, a "healthy" diet   Current exercise: none  Wt Readings from Last 3 Encounters:  04/26/16 237 lb (107.5 kg)  10/25/15 243 lb 12.8 oz (110.6 kg)  06/04/15 239 lb 4.8 oz (108.5 kg)   Patient does report that she has been doing a keto basic diet without going fully into ketoacidosis essentially decreasing her carbohydrate intake. She reports that since she started her diet she has discontinued her metformin for her diabetes and as well as discontinuing her atorvastatin for her cholesterol. She is also using essential oils. -------------------------------------------------------------------     Allergies  Allergen Reactions  . Iodinated Diagnostic Agents Anaphylaxis  . Iodine Swelling     Current Outpatient Prescriptions:  .  celecoxib (CELEBREX) 100 MG capsule, TAKE 1 CAPSULE (100 MG TOTAL) BY MOUTH 2 (TWO) TIMES DAILY., Disp: 60 capsule, Rfl: 5 .  metFORMIN (GLUCOPHAGE) 500 MG tablet, Take 1 tablet (500 mg total) by mouth 2 (two) times daily with a meal., Disp: 60 tablet, Rfl: 6 .  NEXIUM 20 MG capsule, TAKE ONE CAPSULE BY MOUTH EVERY DAY IF NO RECURRENCE, Disp: 30 capsule, Rfl: 6 .  nystatin (MYCOSTATIN) powder, Apply to affected area 3 times  daily, Disp: 15 g, Rfl: 1 .  tamsulosin (FLOMAX) 0.4 MG CAPS capsule, TAKE 1 CAPSULE (0.4 MG TOTAL) BY MOUTH DAILY., Disp: , Rfl: 3 .  aspirin EC 81 MG tablet, Take 81 mg by mouth., Disp: , Rfl:  .  atorvastatin (LIPITOR) 40 MG tablet, TAKE 1 TABLET (40 MG TOTAL) BY MOUTH DAILY. (Patient not taking: Reported on 04/26/2016), Disp: 90 tablet, Rfl: 3 .  gabapentin (NEURONTIN) 300 MG capsule, Take 2 capsules (600  mg total) by mouth 3 (three) times daily. (Patient not taking: Reported on 04/26/2016), Disp: 180 capsule, Rfl: 6 .  naproxen sodium (ALEVE) 220 MG tablet, Take 220 mg by mouth., Disp: , Rfl:   Review of Systems  Constitutional: Negative.   Eyes: Negative.   Respiratory: Negative.   Cardiovascular: Negative.   Endocrine: Negative.   Neurological: Negative.     Social History  Substance Use Topics  . Smoking status: Never Smoker  . Smokeless tobacco: Never Used  . Alcohol use No   Objective:   BP 120/86 (BP Location: Left Arm, Patient Position: Sitting, Cuff Size: Large)   Pulse 67   Temp 97.8 F (36.6 C) (Oral)   Resp 16   Wt 237 lb (107.5 kg)   SpO2 96%   BMI 37.12 kg/m   Physical Exam  Constitutional: She appears well-developed and well-nourished. No distress.  Neck: Normal range of motion. Neck supple.  Cardiovascular: Normal rate, regular rhythm and normal heart sounds.  Exam reveals no gallop and no friction rub.   No murmur heard. Pulmonary/Chest: Effort normal and breath sounds normal. No respiratory distress. She has no wheezes. She has no rales.  Musculoskeletal: She exhibits no edema.  Skin: She is not diaphoretic.  Psychiatric: She has a normal mood and affect. Her behavior is normal. Judgment and thought content normal.  Vitals reviewed.     Assessment & Plan:     1. Type 2 diabetes mellitus with diabetic polyneuropathy, without long-term current use of insulin (HCC) A1c has improved yet again to 6.4. I will check labs as below and follow-up pending results. We will recheck labs in approximately 3 months. She is to continue healthy lifestyle modifications. When we recheck in 3 months if her sugar is still stable she may continue to not take her metformin. - POCT glycosylated hemoglobin (Hb A1C) - CBC - Basic Metabolic Panel (BMET)  2. Hypercholesterolemia Patient discontinued his atorvastatin on her own secondary to changing her dietary habits and  increasing her exercise. I will check labs as below and follow-up pending results. - Lipid Profile       Mar Daring, PA-C  [facility]

## 2016-04-27 ENCOUNTER — Telehealth: Payer: Self-pay

## 2016-04-27 LAB — CBC
HEMATOCRIT: 42.1 % (ref 34.0–46.6)
Hemoglobin: 14.8 g/dL (ref 11.1–15.9)
MCH: 30.9 pg (ref 26.6–33.0)
MCHC: 35.2 g/dL (ref 31.5–35.7)
MCV: 88 fL (ref 79–97)
PLATELETS: 289 10*3/uL (ref 150–379)
RBC: 4.79 x10E6/uL (ref 3.77–5.28)
RDW: 14.1 % (ref 12.3–15.4)
WBC: 9.3 10*3/uL (ref 3.4–10.8)

## 2016-04-27 LAB — BASIC METABOLIC PANEL
BUN / CREAT RATIO: 15 (ref 12–28)
BUN: 14 mg/dL (ref 8–27)
CHLORIDE: 98 mmol/L (ref 96–106)
CO2: 26 mmol/L (ref 18–29)
Calcium: 10.6 mg/dL — ABNORMAL HIGH (ref 8.7–10.3)
Creatinine, Ser: 0.96 mg/dL (ref 0.57–1.00)
GFR calc non Af Amer: 64 mL/min/{1.73_m2} (ref >59)
GFR, EST AFRICAN AMERICAN: 73 mL/min/{1.73_m2} (ref >59)
GLUCOSE: 105 mg/dL — AB (ref 65–99)
POTASSIUM: 4.5 mmol/L (ref 3.5–5.2)
Sodium: 138 mmol/L (ref 134–144)

## 2016-04-27 LAB — LIPID PANEL
CHOLESTEROL TOTAL: 216 mg/dL — AB (ref 100–199)
Chol/HDL Ratio: 5 ratio units — ABNORMAL HIGH (ref 0.0–4.4)
HDL: 43 mg/dL (ref >39)
LDL Calculated: 131 mg/dL — ABNORMAL HIGH (ref 0–99)
Triglycerides: 212 mg/dL — ABNORMAL HIGH (ref 0–149)
VLDL CHOLESTEROL CAL: 42 mg/dL — AB (ref 5–40)

## 2016-04-27 NOTE — Telephone Encounter (Signed)
-----   Message from Mar Daring, PA-C sent at 04/27/2016  8:31 AM EST ----- Labs are stable with exception of cholesterol. Total cholesterol and LDL have increased from previously. HDL has improved with the black cumin seed oil you are using. Triglycerides have improved as well with healthy dieting. I do recommend restarting your statin to lower the total cholesterol and LDL again.

## 2016-04-27 NOTE — Telephone Encounter (Signed)
Patient advised as directed below.  Thanks,  -[name] 

## 2016-05-15 ENCOUNTER — Other Ambulatory Visit: Payer: Self-pay | Admitting: Physician Assistant

## 2016-05-15 DIAGNOSIS — L4050 Arthropathic psoriasis, unspecified: Secondary | ICD-10-CM

## 2016-05-15 DIAGNOSIS — M069 Rheumatoid arthritis, unspecified: Secondary | ICD-10-CM

## 2016-05-20 ENCOUNTER — Other Ambulatory Visit: Payer: Self-pay | Admitting: Physician Assistant

## 2016-05-23 DIAGNOSIS — C541 Malignant neoplasm of endometrium: Secondary | ICD-10-CM | POA: Diagnosis not present

## 2016-06-22 ENCOUNTER — Telehealth: Payer: Self-pay | Admitting: Physician Assistant

## 2016-07-24 ENCOUNTER — Ambulatory Visit
Admission: RE | Admit: 2016-07-24 | Discharge: 2016-07-24 | Disposition: A | Discharge status: Home / Self Care | Payer: Medicare Other | Source: Ambulatory Visit | Attending: Physician Assistant | Admitting: Physician Assistant

## 2016-07-24 ENCOUNTER — Encounter: Payer: Self-pay | Admitting: Physician Assistant

## 2016-07-24 ENCOUNTER — Ambulatory Visit (INDEPENDENT_AMBULATORY_CARE_PROVIDER_SITE_OTHER): Payer: Medicare Other | Admitting: Physician Assistant

## 2016-07-24 VITALS — BP 128/82 | HR 72 | Temp 98.1°F | Resp 16 | Ht 67.0 in | Wt 229.0 lb

## 2016-07-24 DIAGNOSIS — M25561 Pain in right knee: Secondary | ICD-10-CM | POA: Diagnosis not present

## 2016-07-24 DIAGNOSIS — M25761 Osteophyte, right knee: Secondary | ICD-10-CM | POA: Insufficient documentation

## 2016-07-24 DIAGNOSIS — C541 Malignant neoplasm of endometrium: Secondary | ICD-10-CM

## 2016-07-24 DIAGNOSIS — Z1211 Encounter for screening for malignant neoplasm of colon: Secondary | ICD-10-CM

## 2016-07-24 DIAGNOSIS — Z860100 Personal history of colon polyps, unspecified: Secondary | ICD-10-CM

## 2016-07-24 DIAGNOSIS — M25461 Effusion, right knee: Secondary | ICD-10-CM | POA: Insufficient documentation

## 2016-07-24 DIAGNOSIS — L4050 Arthropathic psoriasis, unspecified: Secondary | ICD-10-CM | POA: Diagnosis not present

## 2016-07-24 DIAGNOSIS — E1142 Type 2 diabetes mellitus with diabetic polyneuropathy: Secondary | ICD-10-CM | POA: Diagnosis not present

## 2016-07-24 DIAGNOSIS — Z6835 Body mass index (BMI) 35.0-35.9, adult: Secondary | ICD-10-CM | POA: Diagnosis not present

## 2016-07-24 DIAGNOSIS — M069 Rheumatoid arthritis, unspecified: Secondary | ICD-10-CM | POA: Diagnosis not present

## 2016-07-24 LAB — POCT GLYCOSYLATED HEMOGLOBIN (HGB A1C)
Est. average glucose Bld gHb Est-mCnc: 134
HEMOGLOBIN A1C: 6.3

## 2016-07-24 NOTE — Progress Notes (Signed)
Patient: Jean Gonzales Female    DOB: 1956/02/09   61 y.o.   MRN: [id number] Visit Date: 07/24/2016  Today's Provider: Mar Daring, PA-C   Chief Complaint  Patient presents with  . Diabetes  . Hyperlipidemia  . Obesity   Subjective:    HPI      Diabetes Mellitus Type II, Follow-up:   Lab Results  Component Value Date   HGBA1C 6.3 07/24/2016   HGBA1C 6.4 04/26/2016   HGBA1C 6.5 10/25/2015    Last seen for diabetes 3 months ago.  Management since then includes continuing current plan of care; if A1C is still stable at FU, PCP was going to consider D/C Metformin. Pt reports she is not taking the Metformin. Current symptoms include none and have been stable. Home blood sugar records: fasting range: 120's  Episodes of hypoglycemia? no   Current Insulin Regimen: N/A Most Recent Eye Exam: pt is due Weight trend: decreasing steadily Prior visit with dietician: no Current diet: Keto Current exercise: none due to arthralgias. Pt states she lives on an acre of land, and plans to start walking the perimeter of her land now that the weather is improving.  Pertinent Labs:    Component Value Date/Time   CHOL 216 (H) 04/26/2016 1217   TRIG 212 (H) 04/26/2016 1217   HDL 43 04/26/2016 1217   LDLCALC 131 (H) 04/26/2016 1217   CREATININE 0.96 04/26/2016 1217    Wt Readings from Last 3 Encounters:  07/24/16 229 lb (103.9 kg)  04/26/16 237 lb (107.5 kg)  10/25/15 243 lb 12.8 oz (110.6 kg)    ------------------------------------------------------------------------   Lipid/Cholesterol, Follow-up:   Last seen for this3 months ago.  Management changes since that visit include advising pt to restart Statin. . Last Lipid Panel:    Component Value Date/Time   CHOL 216 (H) 04/26/2016 1217   TRIG 212 (H) 04/26/2016 1217   HDL 43 04/26/2016 1217   CHOLHDL 5.0 (H) 04/26/2016 1217   LDLCALC 131 (H) 04/26/2016 1217    Risk factors for vascular disease  include diabetes mellitus and hypercholesterolemia  She reports fair compliance with treatment. Pt takes the Statin about every other day, due to forgetfulness. -------------------------------------------------------------------  Obesity Pt has lost 8 pounds since LOV. Pt is on the keto diet, but is not exercising due to arthralgias (knee pain secondary to OA). Pt's BMI is now 35.87.   Allergies  Allergen Reactions  . Iodinated Diagnostic Agents Anaphylaxis  . Iodine Swelling     Current Outpatient Prescriptions:  .  aspirin EC 81 MG tablet, Take 81 mg by mouth., Disp: , Rfl:  .  atorvastatin (LIPITOR) 40 MG tablet, TAKE 1 TABLET (40 MG TOTAL) BY MOUTH DAILY. (Patient taking differently: Take 40 mg by mouth every other day. ), Disp: 90 tablet, Rfl: 3 .  celecoxib (CELEBREX) 100 MG capsule, TAKE 1 CAPSULE BY MOUTH 2 (TWO) TIMES DAILY., Disp: 60 capsule, Rfl: 5 .  naproxen sodium (ALEVE) 220 MG tablet, Take 220 mg by mouth., Disp: , Rfl:  .  NEXIUM 20 MG capsule, TAKE ONE CAPSULE BY MOUTH EVERY DAY IF NO RECURRENCE, Disp: 30 capsule, Rfl: 6 .  nystatin (MYCOSTATIN) powder, Apply to affected area 3 times daily, Disp: 15 g, Rfl: 1 .  tamsulosin (FLOMAX) 0.4 MG CAPS capsule, TAKE 1 CAPSULE (0.4 MG TOTAL) BY MOUTH DAILY., Disp: 90 capsule, Rfl: 3 .  gabapentin (NEURONTIN) 300 MG capsule, Take 2 capsules (600 mg total)  by mouth 3 (three) times daily. (Patient not taking: Reported on 04/26/2016), Disp: 180 capsule, Rfl: 6 .  metFORMIN (GLUCOPHAGE) 500 MG tablet, Take 1 tablet (500 mg total) by mouth 2 (two) times daily with a meal., Disp: 60 tablet, Rfl: 6  Review of Systems  Constitutional: Negative for activity change, appetite change, chills, diaphoresis, fatigue, fever and unexpected weight change.  Respiratory: Negative for cough, chest tightness and shortness of breath.   Cardiovascular: Positive for leg swelling (due to arthritis). Negative for chest pain and palpitations.  Endocrine:  Positive for polyuria (due to narrow urethra). Negative for polydipsia and polyphagia.  Musculoskeletal: Positive for arthralgias.    Social History  Substance Use Topics  . Smoking status: Never Smoker  . Smokeless tobacco: Never Used  . Alcohol use No   Objective:   BP 128/82 (BP Location: Left Arm, Patient Position: Sitting, Cuff Size: Large)   Pulse 72   Temp 98.1 F (36.7 C) (Oral)   Resp 16   Ht 5\' 7"  (1.702 m)   Wt 229 lb (103.9 kg)   BMI 35.87 kg/m  Vitals:   07/24/16 1101  BP: 128/82  Pulse: 72  Resp: 16  Temp: 98.1 F (36.7 C)  TempSrc: Oral  Weight: 229 lb (103.9 kg)  Height: 5\' 7"  (1.702 m)     Physical Exam  Constitutional: She appears well-developed and well-nourished. No distress.  Neck: Normal range of motion. Neck supple. No JVD present. No tracheal deviation present. No thyromegaly present.  Cardiovascular: Normal rate, regular rhythm and normal heart sounds.  Exam reveals no gallop and no friction rub.   No murmur heard. Pulmonary/Chest: Effort normal and breath sounds normal. No respiratory distress. She has no wheezes. She has no rales.  Musculoskeletal: She exhibits no edema.  Lymphadenopathy:    She has no cervical adenopathy.  Skin: She is not diaphoretic.  Vitals reviewed.      Assessment & Plan:     1. Type 2 diabetes mellitus with diabetic polyneuropathy, without long-term current use of insulin (HCC) A1c continues to improve. Patient is not on any medication at this time. Continue healthy lifestyle modifications.  - POCT glycosylated hemoglobin (Hb A1C)  2. BMI 35.0-35.9,adult Patient continues to follow ketogenic diet and exercise.   3. Acute pain of right knee Acute pain and swelling of right knee. Patient does have h/o psoriatic arthritis and RA. I suspect this to be more OA due to onset and presentation. Will get xray to check for bony abnormalities. I do suspect more patellofemoral arthritis due to pain with flexion and  extension as well as stairs bothering her. Also discussed bracing the knee for support. Patient is already on Celebrex for RA and psoriatic arthritis. - DG Knee Complete 4 Views Right; Future  4. Psoriatic arthritis ([place]) On Celebrex. Followed by Dr. Precious Reel. Can not start biologics as treatment until she is 5 years out from cancer remission. She is on year 3 currently.   5. Rheumatoid arthritis involving multiple sites, unspecified rheumatoid factor presence (HCC) On Celebrex. Followed by Dr. Lindon Romp.  6. History of colon polyps Due for colonoscopy. Referral placed.  7. Malignant neoplasm of endometrium ([place]) Followed by [facility], Dr. Alycia Rossetti.       Mar Daring, PA-C  [facility]

## 2016-07-24 NOTE — Patient Instructions (Signed)
Diabetes Mellitus and Exercise Exercising regularly is important for your overall health, especially when you have diabetes (diabetes mellitus). Exercising is not only about losing weight. It has many health benefits, such as increasing muscle strength and bone density and reducing body fat and stress. This leads to improved fitness, flexibility, and endurance, all of which result in better overall health. Exercise has additional benefits for people with diabetes, including:  Reducing appetite.  Helping to lower and control blood glucose.  Lowering blood pressure.  Helping to control amounts of fatty substances (lipids) in the blood, such as cholesterol and triglycerides.  Helping the body to respond better to insulin (improving insulin sensitivity).  Reducing how much insulin the body needs.  Decreasing the risk for heart disease by:  Lowering cholesterol and triglyceride levels.  Increasing the levels of good cholesterol.  Lowering blood glucose levels. What is my activity plan? Your health care provider or certified diabetes educator can help you make a plan for the type and frequency of exercise (activity plan) that works for you. Make sure that you:  Do at least 150 minutes of moderate-intensity or vigorous-intensity exercise each week. This could be brisk walking, biking, or water aerobics.  Do stretching and strength exercises, such as yoga or weightlifting, at least 2 times a week.  Spread out your activity over at least 3 days of the week.  Get some form of physical activity every day.  Do not go more than 2 days in a row without some kind of physical activity.  Avoid being inactive for more than 90 minutes at a time. Take frequent breaks to walk or stretch.  Choose a type of exercise or activity that you enjoy, and set realistic goals.  Start slowly, and gradually increase the intensity of your exercise over time. What do I need to know about managing my  diabetes?  Check your blood glucose before and after exercising.  If your blood glucose is higher than 240 mg/dL (13.3 mmol/L) before you exercise, check your urine for ketones. If you have ketones in your urine, do not exercise until your blood glucose returns to normal.  Know the symptoms of low blood glucose (hypoglycemia) and how to treat it. Your risk for hypoglycemia increases during and after exercise. Common symptoms of hypoglycemia can include:  Hunger.  Anxiety.  Sweating and feeling clammy.  Confusion.  Dizziness or feeling light-headed.  Increased heart rate or palpitations.  Blurry vision.  Tingling or numbness around the mouth, lips, or tongue.  Tremors or shakes.  Irritability.  Keep a rapid-acting carbohydrate snack available before, during, and after exercise to help prevent or treat hypoglycemia.  Avoid injecting insulin into areas of the body that are going to be exercised. For example, avoid injecting insulin into:  The arms, when playing tennis.  The legs, when jogging.  Keep records of your exercise habits. Doing this can help you and your health care provider adjust your diabetes management plan as needed. Write down:  Food that you eat before and after you exercise.  Blood glucose levels before and after you exercise.  The type and amount of exercise you have done.  When your insulin is expected to peak, if you use insulin. Avoid exercising at times when your insulin is peaking.  When you start a new exercise or activity, work with your health care provider to make sure the activity is safe for you, and to adjust your insulin, medicines, or food intake as needed.  Drink plenty   of water while you exercise to prevent dehydration or heat stroke. Drink enough fluid to keep your urine clear or pale yellow. This information is not intended to replace advice given to you by your health care provider. Make sure you discuss any questions you have with  your health care provider. Document Released: 06/03/2003 Document Revised: 10/01/2015 Document Reviewed: 08/23/2015 Elsevier Interactive Patient Education  2017 Elsevier Inc.  

## 2016-07-25 ENCOUNTER — Telehealth: Payer: Self-pay

## 2016-07-25 DIAGNOSIS — M1711 Unilateral primary osteoarthritis, right knee: Secondary | ICD-10-CM

## 2016-07-25 NOTE — Telephone Encounter (Signed)
-----   Message from Mar Daring, [place] sent at 07/24/2016  2:30 PM EDT ----- Joint effusion noted with arthritic changes on the patella with trace bone spurs. Would recommend referral to orthopedics. This seems more OA over being RA or psoriatic arthritis.

## 2016-07-25 NOTE — Telephone Encounter (Signed)
Patient advised as directed below. She does want the referral to Orthopedics something in town.  Thanks,  -Greene Diodato

## 2016-07-26 NOTE — Telephone Encounter (Signed)
Referral placed.

## 2016-08-02 DIAGNOSIS — M1711 Unilateral primary osteoarthritis, right knee: Secondary | ICD-10-CM | POA: Diagnosis not present

## 2016-08-25 DIAGNOSIS — E876 Hypokalemia: Secondary | ICD-10-CM | POA: Diagnosis not present

## 2016-08-25 DIAGNOSIS — I10 Essential (primary) hypertension: Secondary | ICD-10-CM | POA: Diagnosis not present

## 2016-08-25 DIAGNOSIS — C541 Malignant neoplasm of endometrium: Secondary | ICD-10-CM | POA: Diagnosis not present

## 2016-08-25 DIAGNOSIS — Z79899 Other long term (current) drug therapy: Secondary | ICD-10-CM | POA: Diagnosis not present

## 2016-08-25 DIAGNOSIS — Z7984 Long term (current) use of oral hypoglycemic drugs: Secondary | ICD-10-CM | POA: Diagnosis not present

## 2016-08-25 DIAGNOSIS — R1030 Lower abdominal pain, unspecified: Secondary | ICD-10-CM | POA: Diagnosis not present

## 2016-08-25 DIAGNOSIS — N359 Urethral stricture, unspecified: Secondary | ICD-10-CM | POA: Diagnosis not present

## 2016-08-25 DIAGNOSIS — Z7982 Long term (current) use of aspirin: Secondary | ICD-10-CM | POA: Diagnosis not present

## 2016-08-25 DIAGNOSIS — Z9221 Personal history of antineoplastic chemotherapy: Secondary | ICD-10-CM | POA: Diagnosis not present

## 2016-08-25 DIAGNOSIS — E119 Type 2 diabetes mellitus without complications: Secondary | ICD-10-CM | POA: Diagnosis not present

## 2016-08-25 DIAGNOSIS — E785 Hyperlipidemia, unspecified: Secondary | ICD-10-CM | POA: Diagnosis not present

## 2016-08-25 DIAGNOSIS — R1031 Right lower quadrant pain: Secondary | ICD-10-CM | POA: Diagnosis not present

## 2016-08-25 DIAGNOSIS — K219 Gastro-esophageal reflux disease without esophagitis: Secondary | ICD-10-CM | POA: Diagnosis not present

## 2016-08-25 DIAGNOSIS — N1330 Unspecified hydronephrosis: Secondary | ICD-10-CM | POA: Diagnosis not present

## 2016-08-25 DIAGNOSIS — M1990 Unspecified osteoarthritis, unspecified site: Secondary | ICD-10-CM | POA: Diagnosis not present

## 2016-08-25 DIAGNOSIS — M069 Rheumatoid arthritis, unspecified: Secondary | ICD-10-CM | POA: Diagnosis not present

## 2016-08-25 DIAGNOSIS — R339 Retention of urine, unspecified: Secondary | ICD-10-CM | POA: Diagnosis not present

## 2016-08-26 DIAGNOSIS — E119 Type 2 diabetes mellitus without complications: Secondary | ICD-10-CM | POA: Diagnosis not present

## 2016-08-26 DIAGNOSIS — M0570 Rheumatoid arthritis with rheumatoid factor of unspecified site without organ or systems involvement: Secondary | ICD-10-CM | POA: Diagnosis not present

## 2016-08-26 DIAGNOSIS — R339 Retention of urine, unspecified: Secondary | ICD-10-CM | POA: Diagnosis not present

## 2016-08-26 DIAGNOSIS — K219 Gastro-esophageal reflux disease without esophagitis: Secondary | ICD-10-CM | POA: Diagnosis not present

## 2016-08-28 ENCOUNTER — Telehealth: Payer: Self-pay | Admitting: Physician Assistant

## 2016-08-28 ENCOUNTER — Other Ambulatory Visit: Payer: Medicare Other

## 2016-08-28 NOTE — Telephone Encounter (Signed)
Pt stated that she had to go to the ER over the weekend b/c her urethra closed and they had to insert a cath. Pt stated that she was advised to have the cath removed 09/01/16. Pt was hoping that [facility] could remove the cath. I advised that I wasn't sure if that was something [facility] could do (I offered send back a message to ask) or if she would need to contact [facility] since she has been seen there in the past. Pt stated that she would just contact [facility] to see if they could see her and remove the cath. Thanks TNP

## 2016-08-28 NOTE — Telephone Encounter (Signed)
Patient reports will call [facility].

## 2016-08-28 NOTE — Telephone Encounter (Signed)
No unfortunately this is something normally done in Urology as they will normally put fluid in, pull cath, and make sure she can void on her own. If still unable to void she has to have cath placed back

## 2016-08-31 ENCOUNTER — Ambulatory Visit (INDEPENDENT_AMBULATORY_CARE_PROVIDER_SITE_OTHER): Payer: Medicare Other

## 2016-08-31 VITALS — BP 129/75 | HR 80 | Ht 67.5 in | Wt 220.0 lb

## 2016-08-31 DIAGNOSIS — R109 Unspecified abdominal pain: Secondary | ICD-10-CM | POA: Diagnosis not present

## 2016-08-31 NOTE — Progress Notes (Signed)
Fill and Pull Catheter Removal  Patient is present today for a catheter removal.  Patient was cleaned and prepped in a sterile fashion 250ml of sterile water/ saline was instilled into the bladder when the patient felt the urge to urinate. 1ml of water was then drained from the balloon.  A 16FR foley cath was removed from the bladder no complications were noted .  Patient as then given some time to void on their own.  Patient can void  267ml on their own after some time.  Patient tolerated well.  Performed by: C. Corinna Capra, CMA     Follow up/ Additional notes: w/ Dr. Pilar Jarvis in  1-2 weeks

## 2016-09-14 ENCOUNTER — Ambulatory Visit (INDEPENDENT_AMBULATORY_CARE_PROVIDER_SITE_OTHER): Payer: Medicare Other | Admitting: Urology

## 2016-09-14 ENCOUNTER — Encounter: Payer: Self-pay | Admitting: Urology

## 2016-09-14 VITALS — BP 132/87 | HR 84 | Ht 67.0 in | Wt 220.0 lb

## 2016-09-14 DIAGNOSIS — R339 Retention of urine, unspecified: Secondary | ICD-10-CM | POA: Diagnosis not present

## 2016-09-14 DIAGNOSIS — N3644 Muscular disorders of urethra: Secondary | ICD-10-CM

## 2016-09-14 LAB — BLADDER SCAN AMB NON-IMAGING

## 2016-09-14 NOTE — Progress Notes (Signed)
09/14/2016 3:26 PM   [name] [birth year] [id number]  Referring provider: Mar Daring, PA-C [place] [address], [place] [address]  Chief Complaint  Patient presents with  . Urinary Retention    follow up    HPI: The patient is a 61 year old female with a history of uterine cancer status post hysterctomy, radiation/chemotherapy therapy who recently developed urinary retention of an unknown volume. Her Foley catheter was removed today and she initially to 351 cc PVR. However she was instructed to double void and her PVR fell to 84 cc. This is been a long-term problem for her. In discussing this with her it sounds like she has DSD. She is currently on Flomax for this. She ran out of her medication which percent of the hospital with urinary retention and a Foley catheter was placed with some degree of difficulty. Apparently was placed by urologist at an outside hospital. This has been a problem for her for years. She used to catheterize himself once per month for what she describes as keeping her urethra open. She can no longer do this as she has developed severe arthritis in her wrists. Currently she is voiding well. She does have problems with what sounds like DSD where she attempts to void but "locks up." When she doesn't relaxes, she does leak urine particularly when standing. She has had pelvic for PT for this before. She finished her chemotherapy and radiation for her endometrial cancer 3 years ago.   PMH: Past Medical History:  Diagnosis Date  . Arthritis   . Cancer [facility]) 2014   endometrial CA  . Diabetes mellitus without complication ([place])   . Hyperlipidemia   . Hypertension     Surgical History: Past Surgical History:  Procedure Laterality Date  . ABDOMINAL HYSTERECTOMY  2015   cancer  . BREAST BIOPSY Right 2008ish   core  . CHOLECYSTECTOMY  1992  . TONSILLECTOMY  1973    Home Medications:  Allergies as of 09/14/2016      Reactions   Iodinated Diagnostic Agents Anaphylaxis   Iodine Swelling      Medication List       Accurate as of 09/14/16  3:26 PM. Always use your most recent med list.          ALEVE 220 MG tablet Generic drug:  naproxen sodium Take 220 mg by mouth.   aspirin EC 81 MG tablet Take 81 mg by mouth.   atorvastatin 40 MG tablet Commonly known as:  LIPITOR TAKE 1 TABLET (40 MG TOTAL) BY MOUTH DAILY.   celecoxib 100 MG capsule Commonly known as:  CELEBREX TAKE 1 CAPSULE BY MOUTH 2 (TWO) TIMES DAILY.   NEXIUM 20 MG capsule Generic drug:  esomeprazole TAKE ONE CAPSULE BY MOUTH EVERY DAY IF NO RECURRENCE   nystatin powder Commonly known as:  MYCOSTATIN/NYSTOP Apply to affected area 3 times daily   tamsulosin 0.4 MG Caps capsule Commonly known as:  FLOMAX TAKE 1 CAPSULE (0.4 MG TOTAL) BY MOUTH DAILY.       Allergies:  Allergies  Allergen Reactions  . Iodinated Diagnostic Agents Anaphylaxis  . Iodine Swelling    Family History: Family History  Problem Relation Age of Onset  . Diabetes Mother   . Cancer Mother   . Heart disease Father   . Breast cancer Neg Hx     Social History:  reports that she has never smoked. She has never used smokeless tobacco. She reports that she does not  drink alcohol or use drugs.  ROS: UROLOGY Frequent Urination?: Yes Hard to postpone urination?: Yes Burning/pain with urination?: No Get up at night to urinate?: Yes Leakage of urine?: Yes Urine stream starts and stops?: No Trouble starting stream?: No Do you have to strain to urinate?: No Blood in urine?: No Urinary tract infection?: No Sexually transmitted disease?: No Injury to kidneys or bladder?: No Painful intercourse?: No Weak stream?: No Currently pregnant?: No Vaginal bleeding?: No Last menstrual period?: n  Gastrointestinal Nausea?: No Vomiting?: No Indigestion/heartburn?: No Diarrhea?: No Constipation?: No  Constitutional Fever: No Night sweats?: No Weight loss?:  No Fatigue?: No  Skin Skin rash/lesions?: No Itching?: No  Eyes Blurred vision?: No Double vision?: No  Ears/Nose/Throat Sore throat?: No Sinus problems?: No  Hematologic/Lymphatic Swollen glands?: No Easy bruising?: No  Cardiovascular Leg swelling?: No Chest pain?: No  Respiratory Cough?: No Shortness of breath?: No  Endocrine Excessive thirst?: No  Musculoskeletal Back pain?: No Joint pain?: No  Neurological Headaches?: No Dizziness?: No  Psychologic Depression?: No Anxiety?: No  Physical Exam: BP 132/87   Pulse 84   Ht 5\' 7"  (1.702 m)   Wt 220 lb (99.8 kg)   BMI 34.46 kg/m   Constitutional:  Alert and oriented, No acute distress. HEENT: [place] AT, moist mucus membranes.  Trachea midline, no masses. Cardiovascular: No clubbing, cyanosis, or edema. Respiratory: Normal respiratory effort, no increased work of breathing. GI: Abdomen is soft, nontender, nondistended, no abdominal masses GU: No CVA tenderness.  Skin: No rashes, bruises or suspicious lesions. Lymph: No cervical or inguinal adenopathy. Neurologic: Grossly intact, no focal deficits, moving all 4 extremities. Psychiatric: Normal mood and affect.  Laboratory Data: Lab Results  Component Value Date   WBC 9.3 04/26/2016   HGB 14.8 04/26/2016   HCT 42.1 04/26/2016   MCV 88 04/26/2016   PLT 289 04/26/2016    Lab Results  Component Value Date   CREATININE 0.96 04/26/2016    No results found for: PSA  No results found for: TESTOSTERONE  Lab Results  Component Value Date   HGBA1C 6.3 07/24/2016    Urinalysis    Component Value Date/Time   COLORURINE YELLOW 01/25/2007 1533   APPEARANCEUR Cloudy (A) 06/04/2015 1407   LABSPEC 1.011 01/25/2007 1533   PHURINE 6.5 01/25/2007 1533   GLUCOSEU Negative 06/04/2015 1407   HGBUR NEGATIVE 01/25/2007 1533   BILIRUBINUR Negative 06/04/2015 1407   KETONESUR 15 (A) 01/25/2007 1533   PROTEINUR Negative 06/04/2015 1407   PROTEINUR NEGATIVE  01/25/2007 1533   UROBILINOGEN 0.2 06/03/2015 1420   UROBILINOGEN 0.2 01/25/2007 1533   NITRITE Negative 06/04/2015 1407   NITRITE NEGATIVE 01/25/2007 1533   LEUKOCYTESUR 1+ (A) 06/04/2015 1407    Assessment & Plan:    1. Urinary retention 2. DSD 3. Possible history of urethral stricture The patient's story is not exactly clear but it does sound like she has DSD with possible need for dilation for a urethral stricture in the past. However, currently she is able to empty her bladder with a low PVR today. I encouraged her to continue pelvic floor exercises and gave her handout for this today. She will continue her Flomax at this time. She will follow-up in one month to ensure that her PVR remains low.   Return in about 4 weeks (around 10/12/2016) for for PVR.  Nickie Retort, MD  [facility] [address], [place], [place] [address] [phone]

## 2016-09-29 ENCOUNTER — Telehealth: Payer: Self-pay | Admitting: Physician Assistant

## 2016-10-12 ENCOUNTER — Ambulatory Visit: Payer: Medicare Other | Admitting: Urology

## 2016-10-24 ENCOUNTER — Ambulatory Visit (INDEPENDENT_AMBULATORY_CARE_PROVIDER_SITE_OTHER): Payer: Medicare Other | Admitting: Physician Assistant

## 2016-10-24 ENCOUNTER — Encounter: Payer: Self-pay | Admitting: Physician Assistant

## 2016-10-24 VITALS — BP 128/80 | HR 74 | Temp 98.0°F | Resp 16 | Wt 227.4 lb

## 2016-10-24 DIAGNOSIS — L4050 Arthropathic psoriasis, unspecified: Secondary | ICD-10-CM

## 2016-10-24 DIAGNOSIS — E876 Hypokalemia: Secondary | ICD-10-CM | POA: Diagnosis not present

## 2016-10-24 DIAGNOSIS — E1142 Type 2 diabetes mellitus with diabetic polyneuropathy: Secondary | ICD-10-CM | POA: Diagnosis not present

## 2016-10-24 LAB — POCT GLYCOSYLATED HEMOGLOBIN (HGB A1C)
ESTIMATED AVERAGE GLUCOSE: 128
HEMOGLOBIN A1C: 6.1

## 2016-10-24 MED ORDER — NYSTATIN 100000 UNIT/GM EX POWD: CUTANEOUS | 1 refills | Status: DC

## 2016-10-24 NOTE — Patient Instructions (Signed)

## 2016-10-24 NOTE — Progress Notes (Signed)
Patient: Jean Gonzales Female    DOB: [birth year]   61 y.o.   MRN: [id number] Visit Date: 10/24/2016  Today's Provider: Mar Daring, PA-C   Chief Complaint  Patient presents with  . Follow-up    Diabetes   Subjective:    HPI  Diabetes Mellitus Type II, Follow-up:   Lab Results  Component Value Date   HGBA1C 6.1 10/24/2016   HGBA1C 6.3 07/24/2016   HGBA1C 6.4 04/26/2016    Last seen for diabetes 3 months ago.  Management since then includes continue healthy lifestyle modifications. A1C continued to improved. Current symptoms include none and have been stable. Home blood sugar records: 120's for the first 2 months.  Episodes of hypoglycemia? no   Current Insulin Regimen: N/A Most Recent Eye Exam: not yet Weight trend: decreasing steadily Prior visit with dietician: no Current diet: Keto Current exercise: none  Pertinent Labs:    Component Value Date/Time   CHOL 216 (H) 04/26/2016 1217   TRIG 212 (H) 04/26/2016 1217   HDL 43 04/26/2016 1217   LDLCALC 131 (H) 04/26/2016 1217   CREATININE 0.96 04/26/2016 1217    Wt Readings from Last 3 Encounters:  10/24/16 227 lb 6.4 oz (103.1 kg)  09/14/16 220 lb (99.8 kg)  08/31/16 220 lb (99.8 kg)   ------------------------------------------------------------------------     Allergies  Allergen Reactions  . Iodinated Diagnostic Agents Anaphylaxis  . Iodine Swelling     Current Outpatient Prescriptions:  .  aspirin EC 81 MG tablet, Take 81 mg by mouth., Disp: , Rfl:  .  atorvastatin (LIPITOR) 40 MG tablet, TAKE 1 TABLET (40 MG TOTAL) BY MOUTH DAILY. (Patient taking differently: Take 40 mg by mouth every other day. ), Disp: 90 tablet, Rfl: 3 .  celecoxib (CELEBREX) 100 MG capsule, TAKE 1 CAPSULE BY MOUTH 2 (TWO) TIMES DAILY., Disp: 60 capsule, Rfl: 5 .  naproxen sodium (ALEVE) 220 MG tablet, Take 220 mg by mouth., Disp: , Rfl:  .  NEXIUM 20 MG capsule, TAKE ONE CAPSULE BY MOUTH EVERY DAY IF NO  RECURRENCE, Disp: 30 capsule, Rfl: 6 .  nystatin (MYCOSTATIN) powder, Apply to affected area 3 times daily, Disp: 15 g, Rfl: 1 .  tamsulosin (FLOMAX) 0.4 MG CAPS capsule, TAKE 1 CAPSULE (0.4 MG TOTAL) BY MOUTH DAILY., Disp: 90 capsule, Rfl: 3  Review of Systems  Constitutional: Positive for fatigue (still present from hospitalization in June but improving).  Respiratory: Negative for cough, chest tightness and shortness of breath.   Cardiovascular: Negative for chest pain, palpitations and leg swelling.  Gastrointestinal: Negative for abdominal pain and diarrhea.  Endocrine: Negative for polydipsia and polyuria.  Neurological: Positive for weakness. Negative for dizziness, light-headedness, numbness and headaches.    Social History  Substance Use Topics  . Smoking status: Never Smoker  . Smokeless tobacco: Never Used  . Alcohol use No   Objective:   BP 128/80 (BP Location: Left Arm, Patient Position: Sitting, Cuff Size: Large)   Pulse 74   Temp 98 F (36.7 C) (Oral)   Resp 16   Wt 227 lb 6.4 oz (103.1 kg)   BMI 35.62 kg/m    Physical Exam  Constitutional: She appears well-developed and well-nourished. No distress.  Neck: Normal range of motion. Neck supple.  Cardiovascular: Normal rate, regular rhythm and normal heart sounds.  Exam reveals no gallop and no friction rub.   No murmur heard. Pulmonary/Chest: Effort normal and breath sounds normal. No respiratory distress.  She has no wheezes. She has no rales.  Skin: She is not diaphoretic.  Vitals reviewed.      Assessment & Plan:     1. Type 2 diabetes mellitus with diabetic polyneuropathy, without long-term current use of insulin (HCC) A1c continues to improve with lifestyle modifications and patient is now down to 6.1. Continue healthy lifestyle and I will see her back in 3 months for T2DM f/u. - POCT glycosylated hemoglobin (Hb A1C)  2. Hypokalemia Had been significantly low during hospitalization. It was replenished  but will recheck to make sure she is maintaining.  - Basic Metabolic Panel (BMET)  3. Psoriatic arthritis ([place]) Has rash on stomach that she uses nystatin for periodically when it flares. Medication was pulled for refill.  - nystatin (MYCOSTATIN/NYSTOP) powder; Apply to affected area 3 times daily  Dispense: 30 g; Refill: [place], PA-C  [facility]

## 2016-11-03 DIAGNOSIS — E876 Hypokalemia: Secondary | ICD-10-CM | POA: Diagnosis not present

## 2016-11-04 LAB — BASIC METABOLIC PANEL
BUN/Creatinine Ratio: 20 (ref 12–28)
BUN: 17 mg/dL (ref 8–27)
CALCIUM: 9.7 mg/dL (ref 8.7–10.3)
CO2: 22 mmol/L (ref 20–29)
CREATININE: 0.86 mg/dL (ref 0.57–1.00)
Chloride: 103 mmol/L (ref 96–106)
GFR, EST AFRICAN AMERICAN: 84 mL/min/{1.73_m2} (ref >59)
GFR, EST NON AFRICAN AMERICAN: 73 mL/min/{1.73_m2} (ref >59)
Glucose: 156 mg/dL — ABNORMAL HIGH (ref 65–99)
POTASSIUM: 3.7 mmol/L (ref 3.5–5.2)
SODIUM: 141 mmol/L (ref 134–144)

## 2016-11-06 ENCOUNTER — Telehealth: Payer: Self-pay

## 2016-11-06 NOTE — Telephone Encounter (Signed)
I called [facility] and A1c can not be added.

## 2016-11-06 NOTE — Telephone Encounter (Signed)
Nevermind this was done in the office on 7/31. I do apologize. A1c was 6.1.   Labs are stable.

## 2016-11-06 NOTE — Telephone Encounter (Signed)
-----   Message from Mar Daring, PA-C sent at 11/06/2016  8:19 AM EDT ----- Fasting sugar is elevated some, but lab was not fasting. Can we try to add on an A1c? Thanks.

## 2016-11-13 DIAGNOSIS — C541 Malignant neoplasm of endometrium: Secondary | ICD-10-CM | POA: Diagnosis not present

## 2016-11-15 ENCOUNTER — Telehealth: Payer: Self-pay | Admitting: Physician Assistant

## 2016-11-15 NOTE — Telephone Encounter (Signed)
Can we call patient and see if she wants to come in for eval of LUQ pain and to get CT ordered as her oncologist is wanting to do.  Thanks.

## 2016-11-15 NOTE — Telephone Encounter (Signed)
LMTCB  Thanks,  -Samantha Ragen 

## 2016-11-17 ENCOUNTER — Encounter: Payer: Self-pay | Admitting: Physician Assistant

## 2016-11-17 ENCOUNTER — Telehealth: Payer: Self-pay | Admitting: Physician Assistant

## 2016-11-17 ENCOUNTER — Ambulatory Visit (INDEPENDENT_AMBULATORY_CARE_PROVIDER_SITE_OTHER): Payer: Medicare Other | Admitting: Physician Assistant

## 2016-11-17 VITALS — BP 130/80 | HR 73 | Temp 98.1°F | Resp 16 | Wt 230.0 lb

## 2016-11-17 DIAGNOSIS — C541 Malignant neoplasm of endometrium: Secondary | ICD-10-CM

## 2016-11-17 DIAGNOSIS — Z87448 Personal history of other diseases of urinary system: Secondary | ICD-10-CM

## 2016-11-17 DIAGNOSIS — R8290 Unspecified abnormal findings in urine: Secondary | ICD-10-CM | POA: Diagnosis not present

## 2016-11-17 DIAGNOSIS — R1012 Left upper quadrant pain: Secondary | ICD-10-CM | POA: Diagnosis not present

## 2016-11-17 DIAGNOSIS — N3000 Acute cystitis without hematuria: Secondary | ICD-10-CM | POA: Diagnosis not present

## 2016-11-17 DIAGNOSIS — R109 Unspecified abdominal pain: Secondary | ICD-10-CM

## 2016-11-17 DIAGNOSIS — Z789 Other specified health status: Secondary | ICD-10-CM

## 2016-11-17 LAB — POCT URINALYSIS DIPSTICK
BILIRUBIN UA: NEGATIVE
Glucose, UA: NEGATIVE
KETONES UA: NEGATIVE
Nitrite, UA: POSITIVE
PH UA: 6.5 (ref 5.0–8.0)
PROTEIN UA: NEGATIVE
SPEC GRAV UA: 1.015 (ref 1.010–1.025)
Urobilinogen, UA: 0.2 E.U./dL

## 2016-11-17 MED ORDER — SULFAMETHOXAZOLE-TRIMETHOPRIM 800-160 MG PO TABS: 1.0000 | ORAL_TABLET | Freq: Two times a day (BID) | ORAL | 0 refills | Status: DC

## 2016-11-17 NOTE — Telephone Encounter (Signed)
[place] states that pt has had reaction to CT contrast in past becoming short of breath.Per Loma Sousa in scheduling Vivien Rota  (CT tech) suggest doing this study without contrast

## 2016-11-17 NOTE — Telephone Encounter (Signed)
Ok I will change order to CT abd/pelvis w/o

## 2016-11-17 NOTE — Telephone Encounter (Signed)
Per Tawanna Sat please change to CT abd/pelvis w/o

## 2016-11-17 NOTE — Addendum Note (Signed)
Addended by: Mar Daring on: 11/17/2016 01:41 PM   Modules accepted: Orders

## 2016-11-17 NOTE — Progress Notes (Signed)
Patient: Jean Gonzales Female    DOB: [birth year]   61 y.o.   MRN: [id number] Visit Date: 11/17/2016  Today's Provider: Mar Daring, PA-C   Chief Complaint  Patient presents with  . Flank Pain   Subjective:    HPI Patiet is coming in today with c/o LUQ pain and to get CT ordered as her oncologist is wanting to. She reports that sometimes the pain is aching or burning. This morning she woke up it was a aching pain and it feels like a bruise. This has been ongoing for 3-6 months. No bowel changes. No melena or hematochezia. Patient is currently on Nexium and has been taking for years, but recently switched to generic esomeprazole around christmas, which is around time she noticed symptoms. Eating makes it worse. Morning is worse. She has been taking Celebrex 100mg  BID for many years for RA. She does alsohave h/o endometrial malignancy.     Allergies  Allergen Reactions  . Iodinated Diagnostic Agents Anaphylaxis  . Iodine Swelling     Current Outpatient Prescriptions:  .  aspirin EC 81 MG tablet, Take 81 mg by mouth., Disp: , Rfl:  .  atorvastatin (LIPITOR) 40 MG tablet, TAKE 1 TABLET (40 MG TOTAL) BY MOUTH DAILY., Disp: 90 tablet, Rfl: 3 .  celecoxib (CELEBREX) 100 MG capsule, TAKE 1 CAPSULE BY MOUTH 2 (TWO) TIMES DAILY., Disp: 60 capsule, Rfl: 5 .  naproxen sodium (ALEVE) 220 MG tablet, Take 220 mg by mouth., Disp: , Rfl:  .  NEXIUM 20 MG capsule, TAKE ONE CAPSULE BY MOUTH EVERY DAY IF NO RECURRENCE, Disp: 30 capsule, Rfl: 6 .  nystatin (MYCOSTATIN/NYSTOP) powder, Apply to affected area 3 times daily, Disp: 30 g, Rfl: 1 .  tamsulosin (FLOMAX) 0.4 MG CAPS capsule, TAKE 1 CAPSULE (0.4 MG TOTAL) BY MOUTH DAILY., Disp: 90 capsule, Rfl: 3  Review of Systems  Constitutional: Positive for fatigue. Negative for fever.  Respiratory: Negative for cough, chest tightness and shortness of breath.   Cardiovascular: Negative for chest pain, palpitations and leg swelling.    Gastrointestinal: Positive for abdominal distention and abdominal pain. Negative for anal bleeding, blood in stool, constipation, diarrhea, nausea and vomiting.  Genitourinary: Positive for flank pain and frequency. Negative for dysuria.       Cloudy urine    Social History  Substance Use Topics  . Smoking status: Never Smoker  . Smokeless tobacco: Never Used  . Alcohol use No   Objective:   BP 130/80 (BP Location: Left Arm, Patient Position: Sitting, Cuff Size: Large)   Pulse 73   Temp 98.1 F (36.7 C) (Oral)   Resp 16   Wt 230 lb (104.3 kg)   SpO2 98%   BMI 36.02 kg/m    Physical Exam  Constitutional: She is oriented to person, place, and time. She appears well-developed and well-nourished. No distress.  Cardiovascular: Normal rate, regular rhythm and normal heart sounds.  Exam reveals no gallop and no friction rub.   No murmur heard. Pulmonary/Chest: Effort normal and breath sounds normal. No respiratory distress. She has no wheezes. She has no rales.  Abdominal: Soft. Normal appearance and bowel sounds are normal. She exhibits no distension and no mass. There is no hepatosplenomegaly. There is tenderness in the left upper quadrant. There is CVA tenderness (left). There is no rebound and no guarding.  Neurological: She is alert and oriented to person, place, and time.  Skin: Skin is warm and dry. She  is not diaphoretic.  Vitals reviewed.      Assessment & Plan:     1. LUQ pain DDx: NSAID induced gastritis, gastric ulcer, malignancy (metastases). Will get imaging as below. If CT is normal discussed either increasing nexium to BID dosing or switching to pantoprazole trial for 2 weeks to see if any improvements. If no improvement in 2 weeks will refer to GI for further eval and consideration of EGD (patient due for colonoscopy also).  - CT ABDOMEN PELVIS W WO CONTRAST; Future  2. Flank pain See above medical treatment plan. - Urine Culture - POCT Urinalysis Dipstick -  CT ABDOMEN PELVIS W WO CONTRAST; Future  3. Acute cystitis without hematuria Worsening symptoms. Patient recently hospitalized with urethral stenosis and required prolonged catherterization. UA positive. Will treat empirically with Bactrim as below. Continue to push fluids. Urine sent for culture. Will follow up pending C&S results. She is to call if symptoms do not improve or if they worsen.  - sulfamethoxazole-trimethoprim (BACTRIM DS,SEPTRA DS) 800-160 MG tablet; Take 1 tablet by mouth 2 (two) times daily.  Dispense: 20 tablet; Refill: 0  4. Cloudy urine See above medical treatment plan. - Urine Culture - POCT Urinalysis Dipstick  5. History of urinary self-catheterization See above medical treatment plan. - Urine Culture - POCT Urinalysis Dipstick  6. Malignant neoplasm of endometrium [facility]) See above medical treatment plan. - CT ABDOMEN PELVIS W WO CONTRAST; Future       Mar Daring, PA-C  [facility]

## 2016-11-20 ENCOUNTER — Telehealth: Payer: Self-pay

## 2016-11-20 LAB — URINE CULTURE

## 2016-11-20 NOTE — Telephone Encounter (Signed)
Pt is returning call.  ZP#[phone]

## 2016-11-20 NOTE — Telephone Encounter (Signed)
NA

## 2016-11-20 NOTE — Telephone Encounter (Signed)
Patient advised as below.  

## 2016-11-20 NOTE — Telephone Encounter (Signed)
LMTCB  Thanks,  -Izzabell Klasen 

## 2016-11-20 NOTE — Telephone Encounter (Signed)
-----   Message from Mar Daring, PA-C sent at 11/20/2016  8:21 AM EDT ----- Urine culture was negative. If improving with antibiotic continue until completed. Push fluids.

## 2016-11-21 ENCOUNTER — Ambulatory Visit
Admission: RE | Admit: 2016-11-21 | Discharge: 2016-11-21 | Disposition: A | Discharge status: Home / Self Care | Payer: Medicare Other | Source: Ambulatory Visit | Attending: Physician Assistant | Admitting: Physician Assistant

## 2016-11-21 DIAGNOSIS — C541 Malignant neoplasm of endometrium: Secondary | ICD-10-CM | POA: Diagnosis not present

## 2016-11-21 DIAGNOSIS — R1012 Left upper quadrant pain: Secondary | ICD-10-CM

## 2016-11-21 DIAGNOSIS — I700 Atherosclerosis of aorta: Secondary | ICD-10-CM | POA: Diagnosis not present

## 2016-11-21 DIAGNOSIS — R109 Unspecified abdominal pain: Secondary | ICD-10-CM

## 2016-11-23 ENCOUNTER — Telehealth: Payer: Self-pay

## 2016-11-23 DIAGNOSIS — K2100 Gastro-esophageal reflux disease with esophagitis, without bleeding: Secondary | ICD-10-CM

## 2016-11-23 MED ORDER — ESOMEPRAZOLE MAGNESIUM 40 MG PO CPDR: 40.0000 mg | DELAYED_RELEASE_CAPSULE | Freq: Every day | ORAL | 3 refills | Status: DC

## 2016-11-23 NOTE — Telephone Encounter (Signed)
LMTCB  Thanks,  -Phil Corti 

## 2016-11-23 NOTE — Telephone Encounter (Signed)
Refill sent.

## 2016-11-23 NOTE — Telephone Encounter (Signed)
Patient advised as directed below. Per patient she prefers to take Nexium 40mg  if you can send this to her pharmacy please.  Thanks,  -Linzie Criss

## 2016-11-23 NOTE — Telephone Encounter (Signed)
-----   Message from Mar Daring, [place] sent at 11/22/2016 12:44 PM EDT ----- Abdominal CT was normal. I do suspect gastritis vs ulcer as cause since CT normal. Would recommend to increase Nexium to 40mg  or I can prescribe pantoprazole as we discussed to see if symptoms improve.

## 2016-11-24 ENCOUNTER — Telehealth: Payer: Self-pay

## 2016-11-24 DIAGNOSIS — K2100 Gastro-esophageal reflux disease with esophagitis, without bleeding: Secondary | ICD-10-CM

## 2016-11-24 MED ORDER — NEXIUM 40 MG PO CPDR: 40.0000 mg | DELAYED_RELEASE_CAPSULE | Freq: Every day | ORAL | 3 refills | Status: DC

## 2016-11-24 NOTE — Telephone Encounter (Signed)
I sent as brand, but will send again

## 2016-11-24 NOTE — Telephone Encounter (Signed)
Jean Gonzales, Dail states she needs the medication with only Nexium not the one with Esomeprazole please.  Thanks,  -[name]

## 2016-11-29 ENCOUNTER — Telehealth: Payer: Self-pay | Admitting: Physician Assistant

## 2016-11-29 NOTE — Telephone Encounter (Signed)
Please review-Jean Gonzales V Ha Shannahan, RMA  

## 2016-11-29 NOTE — Telephone Encounter (Signed)
Pt called saying the Nexium needs a PR.  She said she went ahead and paid for a rx and it works well.  She just needs Sonia Baller to ask for a PA so she wont have to pay for it out of pocket.  Pt's call back is [phone]  Thanks teri

## 2016-11-29 NOTE — Telephone Encounter (Signed)
PA has been received from pharmacy and has been started already.

## 2016-12-25 NOTE — Telephone Encounter (Signed)
AWV scheduled 

## 2016-12-29 DIAGNOSIS — M1711 Unilateral primary osteoarthritis, right knee: Secondary | ICD-10-CM | POA: Diagnosis not present

## 2016-12-29 DIAGNOSIS — M659 Synovitis and tenosynovitis, unspecified: Secondary | ICD-10-CM | POA: Diagnosis not present

## 2016-12-29 DIAGNOSIS — R52 Pain, unspecified: Secondary | ICD-10-CM | POA: Diagnosis not present

## 2016-12-29 DIAGNOSIS — M25469 Effusion, unspecified knee: Secondary | ICD-10-CM | POA: Insufficient documentation

## 2016-12-29 DIAGNOSIS — M25461 Effusion, right knee: Secondary | ICD-10-CM | POA: Diagnosis not present

## 2017-01-10 ENCOUNTER — Encounter: Payer: Self-pay | Admitting: Urology

## 2017-01-10 ENCOUNTER — Ambulatory Visit (INDEPENDENT_AMBULATORY_CARE_PROVIDER_SITE_OTHER): Payer: Medicare Other | Admitting: Urology

## 2017-01-10 VITALS — BP 139/76 | HR 66 | Ht 67.5 in | Wt 229.8 lb

## 2017-01-10 DIAGNOSIS — R339 Retention of urine, unspecified: Secondary | ICD-10-CM

## 2017-01-10 DIAGNOSIS — N3644 Muscular disorders of urethra: Secondary | ICD-10-CM | POA: Diagnosis not present

## 2017-01-10 DIAGNOSIS — Z9889 Other specified postprocedural states: Secondary | ICD-10-CM | POA: Diagnosis not present

## 2017-01-10 LAB — URINALYSIS, COMPLETE
BILIRUBIN UA: NEGATIVE
Glucose, UA: NEGATIVE
Ketones, UA: NEGATIVE
LEUKOCYTES UA: NEGATIVE
NITRITE UA: NEGATIVE
PH UA: 6 (ref 5.0–7.5)
Protein, UA: NEGATIVE
RBC UA: NEGATIVE
UUROB: 0.2 mg/dL (ref 0.2–1.0)

## 2017-01-10 LAB — BLADDER SCAN AMB NON-IMAGING: SCAN RESULT: 589

## 2017-01-10 MED ORDER — LIDOCAINE HCL 2 % EX GEL
1.0000 "application " | Freq: Once | CUTANEOUS | Status: AC
  Administered 2017-01-10: 1 via URETHRAL

## 2017-01-10 NOTE — Progress Notes (Signed)
01/10/2017 10:07 AM   Jean Gonzales 1956-02-03 [id number]  Referring provider: Mar Daring, PA-C [place], [place] [address]  Chief Complaint  Patient presents with  . Follow-up    Urethral Dilation    HPI: 61 yo WF who presents today in urinary retention.  Background history The patient is a 61 year old female with a history of uterine cancer status post hysterectomy, radiation/chemotherapy therapy who recently developed urinary retention of an unknown volume. Her Foley catheter was removed today and she initially to 351 cc PVR. However she was instructed to double void and her PVR fell to 84 cc. This is been a long-term problem for her. In discussing this with her it sounds like she has DSD. She is currently on Flomax for this. She ran out of her medication which percent of the hospital with urinary retention and a Foley catheter was placed with some degree of difficulty. Apparently was placed by urologist at an outside hospital. This has been a problem for her for years. She used to catheterize himself once per month for what she describes as keeping her urethra open. She can no longer do this as she has developed severe arthritis in her wrists. Currently she is voiding well. She does have problems with what sounds like DSD where she attempts to void but "locks up." When she doesn't relaxes, she does leak urine particularly when standing. She has had pelvic for PT for this before. She finished her chemotherapy and radiation for her endometrial cancer 3 years ago.  Today, she complains of frequency, urgency, nocturia, incontinence, intermittency, straining to urinate and a weak urinary stream. Her UA is unremarkable. Her PVR is 589 mL.  Nothing is helping her urinary symptoms. Nothing is making her urinary symptoms worse.  She states her urinary symptoms have been present for the last week.  She is not having dysuria, gross hematuria or suprapubic pain.   She is on any recent fevers, chills, nausea or vomiting.   PMH: Past Medical History:  Diagnosis Date  . Arthritis   . Cancer [facility]) 2014   endometrial CA  . Diabetes mellitus without complication ([place])   . Hyperlipidemia   . Hypertension     Surgical History: Past Surgical History:  Procedure Laterality Date  . ABDOMINAL HYSTERECTOMY  2015   cancer  . BREAST BIOPSY Right 2008ish   core  . CHOLECYSTECTOMY  1992  . TONSILLECTOMY  1973    Home Medications:  Allergies as of 01/10/2017      Reactions   Iodinated Diagnostic Agents Anaphylaxis   Iodine Swelling      Medication List       Accurate as of 01/10/17 10:07 AM. Always use your most recent med list.          ALEVE 220 MG tablet Generic drug:  naproxen sodium Take 220 mg by mouth.   aspirin EC 81 MG tablet Take 81 mg by mouth.   atorvastatin 40 MG tablet Commonly known as:  LIPITOR TAKE 1 TABLET (40 MG TOTAL) BY MOUTH DAILY.   AZULFIDINE 500 MG tablet Generic drug:  sulfaSALAzine Take by mouth.   CALCIUM CARBONATE PO Take by mouth.   celecoxib 100 MG capsule Commonly known as:  CELEBREX TAKE 1 CAPSULE BY MOUTH 2 (TWO) TIMES DAILY.   docusate sodium 100 MG capsule Commonly known as:  COLACE Take by mouth.   gabapentin 300 MG capsule Commonly known as:  NEURONTIN Take by mouth.  gemfibrozil 600 MG tablet Commonly known as:  LOPID Take by mouth.   hydrochlorothiazide 25 MG tablet Commonly known as:  HYDRODIURIL Take by mouth.   ibuprofen 800 MG tablet Commonly known as:  ADVIL,MOTRIN Take by mouth.   MAGNESIUM OXIDE PO Take by mouth.   MULTIVITAMIN PO Take by mouth.   NEXIUM 40 MG capsule Generic drug:  esomeprazole Take 1 capsule (40 mg total) by mouth daily. Brand preferred by patient   nystatin powder Commonly known as:  MYCOSTATIN/NYSTOP Apply to affected area 3 times daily   oxyCODONE-acetaminophen 5-325 MG tablet Commonly known as:  PERCOCET/ROXICET Take by  mouth.   sulfamethoxazole-trimethoprim 800-160 MG tablet Commonly known as:  BACTRIM DS,SEPTRA DS Take 1 tablet by mouth 2 (two) times daily.   tamsulosin 0.4 MG Caps capsule Commonly known as:  FLOMAX TAKE 1 CAPSULE (0.4 MG TOTAL) BY MOUTH DAILY.   VITAMIN B-6 PO Take by mouth.   Vitamin D (Ergocalciferol) 50000 units Caps capsule Commonly known as:  DRISDOL Take by mouth.       Allergies:  Allergies  Allergen Reactions  . Iodinated Diagnostic Agents Anaphylaxis  . Iodine Swelling    Family History: Family History  Problem Relation Age of Onset  . Diabetes Mother   . Cancer Mother   . Heart disease Father   . Breast cancer Neg Hx   . Kidney cancer Neg Hx   . Bladder Cancer Neg Hx     Social History:  reports that she has never smoked. She has never used smokeless tobacco. She reports that she does not drink alcohol or use drugs.  ROS: UROLOGY Frequent Urination?: Yes Hard to postpone urination?: Yes Burning/pain with urination?: No Get up at night to urinate?: Yes Leakage of urine?: Yes Urine stream starts and stops?: Yes Trouble starting stream?: No Do you have to strain to urinate?: Yes Blood in urine?: No Urinary tract infection?: No Sexually transmitted disease?: No Injury to kidneys or bladder?: No Painful intercourse?: No Weak stream?: Yes Currently pregnant?: No Vaginal bleeding?: No Last menstrual period?: n  Gastrointestinal Nausea?: No Vomiting?: No Indigestion/heartburn?: No Diarrhea?: No Constipation?: No  Constitutional Fever: No Night sweats?: No Weight loss?: No Fatigue?: No  Skin Skin rash/lesions?: No Itching?: No  Eyes Blurred vision?: No Double vision?: No  Ears/Nose/Throat Sore throat?: No Sinus problems?: No  Hematologic/Lymphatic Swollen glands?: No Easy bruising?: No  Cardiovascular Leg swelling?: No Chest pain?: No  Respiratory Cough?: No Shortness of breath?: No  Endocrine Excessive thirst?:  No  Musculoskeletal Back pain?: Yes Joint pain?: Yes  Neurological Headaches?: No Dizziness?: No  Psychologic Depression?: No Anxiety?: No  Physical Exam: BP 139/76   Pulse 66   Ht 5' 7.5" (1.715 m)   Wt 229 lb 12.8 oz (104.2 kg)   BMI 35.46 kg/m   Constitutional:  Alert and oriented, No acute distress. HEENT: [place] AT, moist mucus membranes.  Trachea midline, no masses. Cardiovascular: No clubbing, cyanosis, or edema. Respiratory: Normal respiratory effort, no increased work of breathing. GI: Abdomen is soft, nontender, nondistended, no abdominal masses GU: No CVA tenderness.  Skin: No rashes, bruises or suspicious lesions. Lymph: No cervical or inguinal adenopathy. Neurologic: Grossly intact, no focal deficits, moving all 4 extremities. Psychiatric: Normal mood and affect.  Laboratory Data: Lab Results  Component Value Date   WBC 9.3 04/26/2016   HGB 14.8 04/26/2016   HCT 42.1 04/26/2016   MCV 88 04/26/2016   PLT 289 04/26/2016    Lab Results  Component Value Date   CREATININE 0.86 11/03/2016    Lab Results  Component Value Date   HGBA1C 6.1 10/24/2016   I have reviewed the labs.  Urinalysis Unremarkable.  See EPIC.    Procedure Patient is placed in stirrups and her urethral meatus and vulva are cleansed with Betadine.  2% Lidocaine jelly was inserted into her urethra.  I then dilated her with [name] sounds to a 28 f without difficultly.  She tolerated the procedure well.  She will return in 1 month.  Assessment & Plan:    1. Urinary retention  - Continue the Flomax  - Return in 1 month for PVR  2. DSD  - Patient is engaging and timed voiding and double voiding  3. Possible history of urethral stricture  - Dilation completed today without difficulty to the patient  - Patient return in 1 month for urethral dilation if her PVR is elevated   Return in about 1 month (around 02/10/2017) for urethral dilation.  [name], [facility] [address], [place], [place] [address] [phone]

## 2017-01-23 ENCOUNTER — Ambulatory Visit: Payer: Self-pay | Admitting: Physician Assistant

## 2017-01-23 ENCOUNTER — Other Ambulatory Visit: Payer: Self-pay | Admitting: Physician Assistant

## 2017-01-23 ENCOUNTER — Ambulatory Visit: Payer: Medicare Other

## 2017-01-23 DIAGNOSIS — M069 Rheumatoid arthritis, unspecified: Secondary | ICD-10-CM

## 2017-01-23 DIAGNOSIS — L4050 Arthropathic psoriasis, unspecified: Secondary | ICD-10-CM

## 2017-01-23 NOTE — Progress Notes (Deleted)
Patient: Jean Gonzales Female    DOB: 17-Aug-1955   61 y.o.   MRN: [id number] Visit Date: 01/23/2017  Today's Provider: Mar Daring, PA-C   No chief complaint on file.  Subjective:    HPI  Diabetes Mellitus Type II, Follow-up:   Lab Results  Component Value Date   HGBA1C 6.1 10/24/2016   HGBA1C 6.3 07/24/2016   HGBA1C 6.4 04/26/2016    Last seen for diabetes 3 months ago.  Management since then includes continue healthy lifestyle. She reports excellent compliance with treatment. She is not having side effects.  Current symptoms include {Symptoms; diabetes:14075} and have been {Desc; course:15616}. Home blood sugar records: {diabetes glucometry results:16657}  Episodes of hypoglycemia? {yes***/no:17258}   Current Insulin Regimen:  Most Recent Eye Exam: *** Weight trend: {trend:16658} Current diet: {diet habits:16563} Current exercise: {exercise types:16438}  Pertinent Labs:    Component Value Date/Time   CHOL 216 (H) 04/26/2016 1217   TRIG 212 (H) 04/26/2016 1217   HDL 43 04/26/2016 1217   LDLCALC 131 (H) 04/26/2016 1217   CREATININE 0.86 11/03/2016 1428    Wt Readings from Last 3 Encounters:  01/10/17 229 lb 12.8 oz (104.2 kg)  11/17/16 230 lb (104.3 kg)  10/24/16 227 lb 6.4 oz (103.1 kg)    ------------------------------------------------------------------------     Allergies  Allergen Reactions  . Iodinated Diagnostic Agents Anaphylaxis  . Iodine Swelling     Current Outpatient Prescriptions:  .  aspirin EC 81 MG tablet, Take 81 mg by mouth., Disp: , Rfl:  .  atorvastatin (LIPITOR) 40 MG tablet, TAKE 1 TABLET (40 MG TOTAL) BY MOUTH DAILY., Disp: 90 tablet, Rfl: 3 .  CALCIUM CARBONATE PO, Take by mouth., Disp: , Rfl:  .  celecoxib (CELEBREX) 100 MG capsule, TAKE 1 CAPSULE BY MOUTH 2 (TWO) TIMES DAILY., Disp: 60 capsule, Rfl: 5 .  docusate sodium (COLACE) 100 MG capsule, Take by mouth., Disp: , Rfl:  .  gabapentin (NEURONTIN)  300 MG capsule, Take by mouth., Disp: , Rfl:  .  gemfibrozil (LOPID) 600 MG tablet, Take by mouth., Disp: , Rfl:  .  hydrochlorothiazide (HYDRODIURIL) 25 MG tablet, Take by mouth., Disp: , Rfl:  .  ibuprofen (ADVIL,MOTRIN) 800 MG tablet, Take by mouth., Disp: , Rfl:  .  MAGNESIUM OXIDE PO, Take by mouth., Disp: , Rfl:  .  Multiple Vitamins-Minerals (MULTIVITAMIN PO), Take by mouth., Disp: , Rfl:  .  naproxen sodium (ALEVE) 220 MG tablet, Take 220 mg by mouth., Disp: , Rfl:  .  NEXIUM 40 MG capsule, Take 1 capsule (40 mg total) by mouth daily. Brand preferred by patient, Disp: 30 capsule, Rfl: 3 .  nystatin (MYCOSTATIN/NYSTOP) powder, Apply to affected area 3 times daily, Disp: 30 g, Rfl: 1 .  oxyCODONE-acetaminophen (PERCOCET/ROXICET) 5-325 MG tablet, Take by mouth., Disp: , Rfl:  .  Pyridoxine HCl (VITAMIN B-6 PO), Take by mouth., Disp: , Rfl:  .  sulfamethoxazole-trimethoprim (BACTRIM DS,SEPTRA DS) 800-160 MG tablet, Take 1 tablet by mouth 2 (two) times daily. (Patient not taking: Reported on 01/10/2017), Disp: 20 tablet, Rfl: 0 .  sulfaSALAzine (AZULFIDINE) 500 MG tablet, Take by mouth., Disp: , Rfl:  .  tamsulosin (FLOMAX) 0.4 MG CAPS capsule, TAKE 1 CAPSULE (0.4 MG TOTAL) BY MOUTH DAILY., Disp: 90 capsule, Rfl: 3 .  Vitamin D, Ergocalciferol, (DRISDOL) 50000 units CAPS capsule, Take by mouth., Disp: , Rfl:   Review of Systems  Social History  Substance Use Topics  .  Smoking status: Never Smoker  . Smokeless tobacco: Never Used  . Alcohol use No   Objective:   There were no vitals taken for this visit.   Physical Exam      Assessment & Plan:           Mar Daring, PA-C  [facility]

## 2017-01-24 ENCOUNTER — Ambulatory Visit: Payer: Medicare Other | Admitting: Physician Assistant

## 2017-01-25 ENCOUNTER — Telehealth: Payer: Self-pay

## 2017-01-25 NOTE — Telephone Encounter (Signed)
LMTCB and r/s AWV that pt no showed on 01/23/17. -MM

## 2017-01-29 ENCOUNTER — Telehealth: Payer: Self-pay | Admitting: Physician Assistant

## 2017-01-29 ENCOUNTER — Ambulatory Visit: Payer: Medicare Other

## 2017-01-29 ENCOUNTER — Ambulatory Visit: Payer: Medicare Other | Admitting: Physician Assistant

## 2017-01-29 NOTE — Telephone Encounter (Signed)
Noted  

## 2017-01-29 NOTE — Telephone Encounter (Signed)
Pt called to cancel her 11:30 am and wanted Tawanna Sat to know why she can't make the appt. Pt stated due to her knee she can't walk and until the orthopedic does something for her she won't be able to reschedule her appt with Tawanna Sat. Please advise. Thanks TNP

## 2017-02-06 DIAGNOSIS — M170 Bilateral primary osteoarthritis of knee: Secondary | ICD-10-CM | POA: Diagnosis not present

## 2017-02-06 DIAGNOSIS — M21161 Varus deformity, not elsewhere classified, right knee: Secondary | ICD-10-CM | POA: Diagnosis not present

## 2017-02-06 DIAGNOSIS — Z9229 Personal history of other drug therapy: Secondary | ICD-10-CM | POA: Diagnosis not present

## 2017-02-06 DIAGNOSIS — M1711 Unilateral primary osteoarthritis, right knee: Secondary | ICD-10-CM | POA: Diagnosis not present

## 2017-02-06 DIAGNOSIS — M25561 Pain in right knee: Secondary | ICD-10-CM | POA: Diagnosis not present

## 2017-02-06 DIAGNOSIS — M25461 Effusion, right knee: Secondary | ICD-10-CM | POA: Diagnosis not present

## 2017-02-12 ENCOUNTER — Ambulatory Visit: Payer: Medicare Other | Admitting: Urology

## 2017-02-14 DIAGNOSIS — M1711 Unilateral primary osteoarthritis, right knee: Secondary | ICD-10-CM | POA: Diagnosis not present

## 2017-02-14 DIAGNOSIS — M25561 Pain in right knee: Secondary | ICD-10-CM | POA: Diagnosis not present

## 2017-02-21 DIAGNOSIS — M1711 Unilateral primary osteoarthritis, right knee: Secondary | ICD-10-CM | POA: Diagnosis not present

## 2017-02-21 DIAGNOSIS — M25561 Pain in right knee: Secondary | ICD-10-CM | POA: Diagnosis not present

## 2017-02-26 NOTE — Progress Notes (Deleted)
02/27/2017 11:10 AM   Jean Gonzales [birth year] [id number]  Referring provider: Mar Daring, PA-C [place], [place] [address]  No chief complaint on file.   HPI: 61 yo WF with a history of urinary rentention, DSD and history of urethral stricture who presents today for a one month follow up.    Background history The patient is a 61 year old female with a history of uterine cancer status post hysterectomy, radiation/chemotherapy therapy who recently developed urinary retention of an unknown volume. Her Foley catheter was removed today and she initially to 351 cc PVR. However she was instructed to double void and her PVR fell to 84 cc. This is been a long-term problem for her. In discussing this with her it sounds like she has DSD. She is currently on Flomax for this. She ran out of her medication which percent of the hospital with urinary retention and a Foley catheter was placed with some degree of difficulty. Apparently was placed by urologist at an outside hospital. This has been a problem for her for years. She used to catheterize himself once per month for what she describes as keeping her urethra open. She can no longer do this as she has developed severe arthritis in her wrists. Currently she is voiding well. She does have problems with what sounds like DSD where she attempts to void but "locks up." When she doesn't relaxes, she does leak urine particularly when standing. She has had pelvic for PT for this before. She finished her chemotherapy and radiation for her endometrial cancer 3 years ago.  At her visit on 01/10/2017, she was found to be in urinary retention.  She was dilated and instructed to continue her tamsulosin.  Today, she is experiencing urgency x *** (***), frequency x *** (***), not/is restricting fluids to avoid visits to the restroom ***, not/is engaging in toilet mapping, incontinence x *** (***) and nocturia x *** (***).   Her  PVR is ***.   She has not had gross hematuria, dysuria or suprapubic pain.  She has not had fevers, chills, nausea or vomiting.     PMH: Past Medical History:  Diagnosis Date  . Arthritis   . Cancer [facility]) 2014   endometrial CA  . Diabetes mellitus without complication ([place])   . Hyperlipidemia   . Hypertension     Surgical History: Past Surgical History:  Procedure Laterality Date  . ABDOMINAL HYSTERECTOMY  2015   cancer  . BREAST BIOPSY Right 2008ish   core  . CHOLECYSTECTOMY  1992  . TONSILLECTOMY  1973    Home Medications:  Allergies as of 02/27/2017      Reactions   Iodinated Diagnostic Agents Anaphylaxis   Iodine Swelling      Medication List        Accurate as of 02/26/17 11:10 AM. Always use your most recent med list.          ALEVE 220 MG tablet Generic drug:  naproxen sodium Take 220 mg by mouth.   aspirin EC 81 MG tablet Take 81 mg by mouth.   atorvastatin 40 MG tablet Commonly known as:  LIPITOR TAKE 1 TABLET (40 MG TOTAL) BY MOUTH DAILY.   AZULFIDINE 500 MG tablet Generic drug:  sulfaSALAzine Take by mouth.   CALCIUM CARBONATE PO Take by mouth.   celecoxib 100 MG capsule Commonly known as:  CELEBREX TAKE 1 CAPSULE BY MOUTH 2 (TWO) TIMES DAILY.   docusate sodium 100 MG capsule Commonly  known as:  COLACE Take by mouth.   gabapentin 300 MG capsule Commonly known as:  NEURONTIN Take by mouth.   gemfibrozil 600 MG tablet Commonly known as:  LOPID Take by mouth.   hydrochlorothiazide 25 MG tablet Commonly known as:  HYDRODIURIL Take by mouth.   ibuprofen 800 MG tablet Commonly known as:  ADVIL,MOTRIN Take by mouth.   MAGNESIUM OXIDE PO Take by mouth.   MULTIVITAMIN PO Take by mouth.   NEXIUM 40 MG capsule Generic drug:  esomeprazole Take 1 capsule (40 mg total) by mouth daily. Brand preferred by patient   nystatin powder Commonly known as:  MYCOSTATIN/NYSTOP Apply to affected area 3 times daily     oxyCODONE-acetaminophen 5-325 MG tablet Commonly known as:  PERCOCET/ROXICET Take by mouth.   sulfamethoxazole-trimethoprim 800-160 MG tablet Commonly known as:  BACTRIM DS,SEPTRA DS Take 1 tablet by mouth 2 (two) times daily.   tamsulosin 0.4 MG Caps capsule Commonly known as:  FLOMAX TAKE 1 CAPSULE (0.4 MG TOTAL) BY MOUTH DAILY.   VITAMIN B-6 PO Take by mouth.   Vitamin D (Ergocalciferol) 50000 units Caps capsule Commonly known as:  DRISDOL Take by mouth.       Allergies:  Allergies  Allergen Reactions  . Iodinated Diagnostic Agents Anaphylaxis  . Iodine Swelling    Family History: Family History  Problem Relation Age of Onset  . Diabetes Mother   . Cancer Mother   . Heart disease Father   . Breast cancer Neg Hx   . Kidney cancer Neg Hx   . Bladder Cancer Neg Hx     Social History:  reports that  has never smoked. she has never used smokeless tobacco. She reports that she does not drink alcohol or use drugs.  ROS:                                        Physical Exam: There were no vitals taken for this visit.  Constitutional:  Alert and oriented, No acute distress. HEENT: [place] AT, moist mucus membranes.  Trachea midline, no masses. Cardiovascular: No clubbing, cyanosis, or edema. Respiratory: Normal respiratory effort, no increased work of breathing. GI: Abdomen is soft, nontender, nondistended, no abdominal masses GU: No CVA tenderness.  Skin: No rashes, bruises or suspicious lesions. Lymph: No cervical or inguinal adenopathy. Neurologic: Grossly intact, no focal deficits, moving all 4 extremities. Psychiatric: Normal mood and affect.  Laboratory Data: Lab Results  Component Value Date   WBC 9.3 04/26/2016   HGB 14.8 04/26/2016   HCT 42.1 04/26/2016   MCV 88 04/26/2016   PLT 289 04/26/2016    Lab Results  Component Value Date   CREATININE 0.86 11/03/2016    Lab Results  Component Value Date   HGBA1C 6.1 10/24/2016    I have reviewed the labs.  Urinalysis Unremarkable.  See EPIC.    Procedure Patient is placed in stirrups and her urethral meatus and vulva are cleansed with Betadine.  2% Lidocaine jelly was inserted into her urethra.  I then dilated her with [name] sounds to a 28 f without difficultly.  She tolerated the procedure well.  She will return in 1 month.  Assessment & Plan:    1. Urinary retention  - Continue the Flomax  - Return in 1 month for PVR  2. DSD  - Patient is engaging and timed voiding and double voiding  3.  Possible history of urethral stricture  - Dilation completed today without difficulty to the patient  - Patient return in 1 month for urethral dilation if her PVR is elevated   No Follow-up on file.  [name], [facility] [address] [place], [place] [address] [phone]

## 2017-02-27 ENCOUNTER — Encounter: Payer: Self-pay | Admitting: Urology

## 2017-02-27 ENCOUNTER — Ambulatory Visit: Payer: Medicare Other | Admitting: Urology

## 2017-02-28 DIAGNOSIS — M25461 Effusion, right knee: Secondary | ICD-10-CM | POA: Diagnosis not present

## 2017-02-28 DIAGNOSIS — M25561 Pain in right knee: Secondary | ICD-10-CM | POA: Diagnosis not present

## 2017-02-28 DIAGNOSIS — M1711 Unilateral primary osteoarthritis, right knee: Secondary | ICD-10-CM | POA: Diagnosis not present

## 2017-03-07 DIAGNOSIS — M1711 Unilateral primary osteoarthritis, right knee: Secondary | ICD-10-CM | POA: Diagnosis not present

## 2017-03-07 DIAGNOSIS — M25561 Pain in right knee: Secondary | ICD-10-CM | POA: Diagnosis not present

## 2017-03-23 ENCOUNTER — Ambulatory Visit (INDEPENDENT_AMBULATORY_CARE_PROVIDER_SITE_OTHER): Payer: Medicare Other | Admitting: Urology

## 2017-03-23 ENCOUNTER — Encounter: Payer: Self-pay | Admitting: Urology

## 2017-03-23 VITALS — BP 139/95 | HR 105 | Ht 68.0 in | Wt 248.0 lb

## 2017-03-23 DIAGNOSIS — R339 Retention of urine, unspecified: Secondary | ICD-10-CM

## 2017-03-23 LAB — URINALYSIS, COMPLETE
Bilirubin, UA: NEGATIVE
GLUCOSE, UA: NEGATIVE
Ketones, UA: NEGATIVE
Nitrite, UA: NEGATIVE
PH UA: 6 (ref 5.0–7.5)
PROTEIN UA: NEGATIVE
RBC, UA: NEGATIVE
Specific Gravity, UA: 1.01 (ref 1.005–1.030)
UUROB: 0.2 mg/dL (ref 0.2–1.0)

## 2017-03-23 LAB — MICROSCOPIC EXAMINATION: RBC, UA: NONE SEEN /hpf (ref 0–?)

## 2017-03-23 NOTE — Progress Notes (Signed)
03/23/2017 2:11 PM   Jean Gonzales March 02, 1956 [id number]  Referring provider: Mar Daring, PA-C [place],  [id number]  Chief Complaint  Patient presents with  . Urinary Retention    urethral dilation    HPI: She has seen [name] for periodic urethral dilation which helps her bladder emptying.  She was last seen October 2018.  For the past several days she has noted progressive urinary hesitancy and sensation of incomplete emptying and requests dilation.  Her urinalysis was unremarkable.   PMH: Past Medical History:  Diagnosis Date  . Arthritis   . Cancer [facility]) 2014   endometrial CA  . Diabetes mellitus without complication ([place])   . Hyperlipidemia   . Hypertension     Surgical History: Past Surgical History:  Procedure Laterality Date  . ABDOMINAL HYSTERECTOMY  2015   cancer  . BREAST BIOPSY Right 2008ish   core  . CHOLECYSTECTOMY  1992  . TONSILLECTOMY  1973    Home Medications:  Allergies as of 03/23/2017      Reactions   Iodinated Diagnostic Agents Anaphylaxis   Iodine Swelling      Medication List        Accurate as of 03/23/17  2:11 PM. Always use your most recent med list.          ALEVE 220 MG tablet Generic drug:  naproxen sodium Take 220 mg by mouth.   aspirin EC 81 MG tablet Take 81 mg by mouth.   atorvastatin 40 MG tablet Commonly known as:  LIPITOR TAKE 1 TABLET (40 MG TOTAL) BY MOUTH DAILY.   AZULFIDINE 500 MG tablet Generic drug:  sulfaSALAzine Take by mouth.   CALCIUM CARBONATE PO Take by mouth.   celecoxib 100 MG capsule Commonly known as:  CELEBREX TAKE 1 CAPSULE BY MOUTH 2 (TWO) TIMES DAILY.   docusate sodium 100 MG capsule Commonly known as:  COLACE Take by mouth.   gabapentin 300 MG capsule Commonly known as:  NEURONTIN Take by mouth.   gemfibrozil 600 MG tablet Commonly known as:  LOPID Take by mouth.   hydrochlorothiazide 25 MG tablet Commonly known  as:  HYDRODIURIL Take by mouth.   ibuprofen 800 MG tablet Commonly known as:  ADVIL,MOTRIN Take by mouth.   MAGNESIUM OXIDE PO Take by mouth.   MULTIVITAMIN PO Take by mouth.   NEXIUM 40 MG capsule Generic drug:  esomeprazole Take 1 capsule (40 mg total) by mouth daily. Brand preferred by patient   nystatin powder Commonly known as:  MYCOSTATIN/NYSTOP Apply to affected area 3 times daily   oxyCODONE-acetaminophen 5-325 MG tablet Commonly known as:  PERCOCET/ROXICET Take by mouth.   sulfamethoxazole-trimethoprim 800-160 MG tablet Commonly known as:  BACTRIM DS,SEPTRA DS Take 1 tablet by mouth 2 (two) times daily.   tamsulosin 0.4 MG Caps capsule Commonly known as:  FLOMAX TAKE 1 CAPSULE (0.4 MG TOTAL) BY MOUTH DAILY.   VITAMIN B-6 PO Take by mouth.   Vitamin D (Ergocalciferol) 50000 units Caps capsule Commonly known as:  DRISDOL Take by mouth.       Allergies:  Allergies  Allergen Reactions  . Iodinated Diagnostic Agents Anaphylaxis  . Iodine Swelling    Family History: Family History  Problem Relation Age of Onset  . Diabetes Mother   . Cancer Mother   . Heart disease Father   . Breast cancer Neg Hx   . Kidney cancer Neg Hx   . Bladder Cancer Neg Hx  Social History:  reports that  has never smoked. she has never used smokeless tobacco. She reports that she does not drink alcohol or use drugs.  ROS: UROLOGY Frequent Urination?: No Hard to postpone urination?: Yes Burning/pain with urination?: No Get up at night to urinate?: Yes Leakage of urine?: Yes Urine stream starts and stops?: Yes Trouble starting stream?: No Do you have to strain to urinate?: Yes Blood in urine?: No Urinary tract infection?: No Sexually transmitted disease?: No Injury to kidneys or bladder?: No Painful intercourse?: No Weak stream?: Yes Currently pregnant?: No Vaginal bleeding?: No Last menstrual period?: n  Gastrointestinal Nausea?: No Vomiting?:  No Indigestion/heartburn?: No Diarrhea?: No Constipation?: No  Constitutional Fever: No Night sweats?: No Weight loss?: No Fatigue?: No  Skin Skin rash/lesions?: No Itching?: No  Eyes Blurred vision?: No Double vision?: No  Ears/Nose/Throat Sore throat?: No Sinus problems?: No  Hematologic/Lymphatic Swollen glands?: No Easy bruising?: No  Cardiovascular Leg swelling?: No Chest pain?: No  Respiratory Cough?: No Shortness of breath?: No  Endocrine Excessive thirst?: No  Musculoskeletal Back pain?: Yes Joint pain?: Yes  Neurological Headaches?: No Dizziness?: No  Psychologic Depression?: No Anxiety?: No  Physical Exam: BP (!) 139/95   Pulse (!) 105   Ht 5\' 8"  (1.727 m)   Wt 248 lb (112.5 kg)   BMI 37.71 kg/m   Constitutional:  Alert and oriented, No acute distress.  2% lidocaine gel was instilled per urethra.  The urethra was gently dilated with [name] sounds from 18-26 [place].  Residual was 500 mL.  Laboratory Data: Lab Results  Component Value Date   WBC 9.3 04/26/2016   HGB 14.8 04/26/2016   HCT 42.1 04/26/2016   MCV 88 04/26/2016   PLT 289 04/26/2016    Lab Results  Component Value Date   CREATININE 0.86 11/03/2016    Lab Results  Component Value Date   HGBA1C 6.1 10/24/2016    Urinalysis Microscopy/dipstick negative   Assessment & Plan:    1. Incomplete bladder emptying She will call for persistent symptoms and otherwise keep her regularly scheduled follow-up.  - Urinalysis, Complete    [name], MD  [facility] [address], [place], [place] [address] [phone]

## 2017-04-07 ENCOUNTER — Other Ambulatory Visit: Payer: Self-pay | Admitting: Physician Assistant

## 2017-04-07 DIAGNOSIS — K219 Gastro-esophageal reflux disease without esophagitis: Secondary | ICD-10-CM

## 2017-04-10 ENCOUNTER — Telehealth: Payer: Self-pay | Admitting: Physician Assistant

## 2017-04-11 ENCOUNTER — Encounter: Payer: Self-pay | Admitting: Physician Assistant

## 2017-04-11 ENCOUNTER — Ambulatory Visit (INDEPENDENT_AMBULATORY_CARE_PROVIDER_SITE_OTHER): Payer: Medicare HMO | Admitting: Physician Assistant

## 2017-04-11 VITALS — BP 128/88 | HR 86 | Temp 98.0°F | Wt 238.4 lb

## 2017-04-11 DIAGNOSIS — R319 Hematuria, unspecified: Secondary | ICD-10-CM

## 2017-04-11 DIAGNOSIS — K2100 Gastro-esophageal reflux disease with esophagitis, without bleeding: Secondary | ICD-10-CM

## 2017-04-11 DIAGNOSIS — F321 Major depressive disorder, single episode, moderate: Secondary | ICD-10-CM

## 2017-04-11 DIAGNOSIS — I10 Essential (primary) hypertension: Secondary | ICD-10-CM | POA: Diagnosis not present

## 2017-04-11 DIAGNOSIS — R69 Illness, unspecified: Secondary | ICD-10-CM | POA: Diagnosis not present

## 2017-04-11 DIAGNOSIS — E1142 Type 2 diabetes mellitus with diabetic polyneuropathy: Secondary | ICD-10-CM

## 2017-04-11 DIAGNOSIS — M069 Rheumatoid arthritis, unspecified: Secondary | ICD-10-CM

## 2017-04-11 DIAGNOSIS — L4050 Arthropathic psoriasis, unspecified: Secondary | ICD-10-CM

## 2017-04-11 DIAGNOSIS — M1711 Unilateral primary osteoarthritis, right knee: Secondary | ICD-10-CM

## 2017-04-11 LAB — POCT URINALYSIS DIPSTICK
BILIRUBIN UA: NEGATIVE
GLUCOSE UA: NEGATIVE
Ketones, UA: NEGATIVE
Leukocytes, UA: NEGATIVE
Nitrite, UA: NEGATIVE
Protein, UA: NEGATIVE
Spec Grav, UA: 1.01 (ref 1.010–1.025)
Urobilinogen, UA: 0.2 E.U./dL
pH, UA: 6.5 (ref 5.0–8.0)

## 2017-04-11 LAB — POCT UA - MICROALBUMIN: Microalbumin Ur, POC: 20 mg/L

## 2017-04-11 MED ORDER — NEXIUM 40 MG PO CPDR: 40.0000 mg | DELAYED_RELEASE_CAPSULE | Freq: Every day | ORAL | 3 refills | Status: DC

## 2017-04-11 MED ORDER — HYDROCHLOROTHIAZIDE 25 MG PO TABS: 25.0000 mg | ORAL_TABLET | Freq: Every day | ORAL | 0 refills | Status: DC

## 2017-04-11 MED ORDER — NYSTATIN 100000 UNIT/GM EX POWD: CUTANEOUS | 1 refills | Status: DC

## 2017-04-11 MED ORDER — DICLOFENAC SODIUM 1 % TD GEL: 2.0000 g | Freq: Four times a day (QID) | TRANSDERMAL | 3 refills | Status: DC

## 2017-04-11 MED ORDER — SERTRALINE HCL 50 MG PO TABS: 50.0000 mg | ORAL_TABLET | Freq: Every day | ORAL | 3 refills | Status: DC

## 2017-04-11 NOTE — Patient Instructions (Signed)

## 2017-04-11 NOTE — Progress Notes (Signed)
Patient: Jean Gonzales Female    DOB: 1956-03-24   62 y.o.   MRN: [id number] Visit Date: 04/11/2017  Today's Provider: Mar Daring, PA-C   Chief Complaint  Patient presents with  . Urinary Tract Infection   Subjective:    Urinary Tract Infection   This is a new problem. Episode onset: one episode on Tuesday. The problem occurs intermittently. The problem has been gradually improving. The patient is experiencing no pain. There has been no fever. Associated symptoms include hematuria (once; does have h/o urethral strictures). She has tried nothing for the symptoms.   She is also having worsening depression secondary to right knee pain. She has seen orthopedics and had steroid injections that did not provide relief. She has also went to [facility] in [place] and had hyaluronic acid injections. She was not a candidate for the stem cell treatments they perform due to her cancer history (uterine) and only being 3 years out, not 5 yet. This has been very debilitating to her. She is not exercising as she once was, has gained weight, does not feel confident driving and is walking with a cane to help ambulate.     Allergies  Allergen Reactions  . Iodinated Diagnostic Agents Anaphylaxis  . Iodine Swelling     Current Outpatient Medications:  .  atorvastatin (LIPITOR) 40 MG tablet, TAKE 1 TABLET (40 MG TOTAL) BY MOUTH DAILY., Disp: 90 tablet, Rfl: 3 .  celecoxib (CELEBREX) 100 MG capsule, TAKE 1 CAPSULE BY MOUTH 2 (TWO) TIMES DAILY., Disp: 60 capsule, Rfl: 5 .  gemfibrozil (LOPID) 600 MG tablet, Take by mouth., Disp: , Rfl:  .  ibuprofen (ADVIL,MOTRIN) 800 MG tablet, Take by mouth., Disp: , Rfl:  .  MAGNESIUM OXIDE PO, Take by mouth., Disp: , Rfl:  .  Multiple Vitamins-Minerals (MULTIVITAMIN PO), Take by mouth., Disp: , Rfl:  .  NEXIUM 40 MG capsule, Take 1 capsule (40 mg total) by mouth daily. Brand preferred by patient, Disp: 30 capsule, Rfl: 3 .  nystatin  (MYCOSTATIN/NYSTOP) powder, Apply to affected area 3 times daily, Disp: 30 g, Rfl: 1 .  Pyridoxine HCl (VITAMIN B-6 PO), Take by mouth., Disp: , Rfl:  .  sulfaSALAzine (AZULFIDINE) 500 MG tablet, Take by mouth., Disp: , Rfl:  .  tamsulosin (FLOMAX) 0.4 MG CAPS capsule, TAKE 1 CAPSULE (0.4 MG TOTAL) BY MOUTH DAILY., Disp: 90 capsule, Rfl: 3 .  aspirin EC 81 MG tablet, Take 81 mg by mouth., Disp: , Rfl:  .  CALCIUM CARBONATE PO, Take by mouth., Disp: , Rfl:  .  docusate sodium (COLACE) 100 MG capsule, Take by mouth., Disp: , Rfl:  .  gabapentin (NEURONTIN) 300 MG capsule, Take by mouth., Disp: , Rfl:  .  hydrochlorothiazide (HYDRODIURIL) 25 MG tablet, Take by mouth., Disp: , Rfl:  .  naproxen sodium (ALEVE) 220 MG tablet, Take 220 mg by mouth., Disp: , Rfl:  .  oxyCODONE-acetaminophen (PERCOCET/ROXICET) 5-325 MG tablet, Take by mouth., Disp: , Rfl:  .  Vitamin D, Ergocalciferol, (DRISDOL) 50000 units CAPS capsule, Take by mouth., Disp: , Rfl:   Review of Systems  Constitutional: Negative.   Respiratory: Negative.   Cardiovascular: Negative.   Gastrointestinal: Negative.   Genitourinary: Positive for hematuria (once; does have h/o urethral strictures).    Social History   Tobacco Use  . Smoking status: Never Smoker  . Smokeless tobacco: Never Used  Substance Use Topics  . Alcohol use: No    Alcohol/week:  0.0 oz   Objective:   BP 128/88 (BP Location: Right Arm, Patient Position: Sitting, Cuff Size: Normal)   Pulse 86   Temp 98 F (36.7 C) (Oral)   Wt 238 lb 6.4 oz (108.1 kg)   SpO2 99%   BMI 36.25 kg/m    Physical Exam  Constitutional: She is oriented to person, place, and time. She appears well-developed and well-nourished. No distress.  Cardiovascular: Normal rate, regular rhythm and normal heart sounds. Exam reveals no gallop and no friction rub.  No murmur heard. Pulmonary/Chest: Effort normal and breath sounds normal. No respiratory distress. She has no wheezes. She has  no rales.  Abdominal: Soft. Normal appearance and bowel sounds are normal. She exhibits no distension and no mass. There is no hepatosplenomegaly. There is no tenderness. There is no rebound, no guarding and no CVA tenderness.  Neurological: She is alert and oriented to person, place, and time.  Skin: Skin is warm and dry. She is not diaphoretic.  Psychiatric: Her speech is normal and behavior is normal. Judgment and thought content normal. Cognition and memory are normal. She exhibits a depressed mood. She expresses no homicidal and no suicidal ideation.    Depression screen PHQ 2/9 04/12/2017  Decreased Interest 3  Down, Depressed, Hopeless 3  PHQ - 2 Score 6  Altered sleeping 3  Tired, decreased energy 3  Change in appetite 3  Feeling bad or failure about yourself  3  Trouble concentrating 3  Moving slowly or fidgety/restless 3  Suicidal thoughts 2  PHQ-9 Score 26  Difficult doing work/chores Extremely dIfficult      Assessment & Plan:     1. Hematuria, unspecified type UA was unremarkable today. Advised patient if symptoms persist I would recommend she follow up with her urologist.  - POCT Urinalysis Dipstick  2. Type 2 diabetes mellitus with diabetic polyneuropathy, without long-term current use of insulin (HCC) Microalbumin normal at 20.  - POCT UA - Microalbumin  3. Primary osteoarthritis of right knee Will try diclofenac gel as below for the knee. She is to call if no improvement.  - diclofenac sodium (VOLTAREN) 1 % GEL; Apply 2 g topically 4 (four) times daily.  Dispense: 100 g; Refill: 3  4. Depression, major, single episode, moderate ([place]) I will start sertraline as below for depression and sleep. I will see her back in 4 weeks to see how she is tolerating medications.  - sertraline (ZOLOFT) 50 MG tablet; Take 1 tablet (50 mg total) by mouth daily.  Dispense: 30 tablet; Refill: 3  5. Essential hypertension Stable. Diagnosis pulled for medication refill. Continue  current medical treatment plan. - hydrochlorothiazide (HYDRODIURIL) 25 MG tablet; Take 1 tablet (25 mg total) by mouth daily.  Dispense: 30 tablet; Refill: 0  6. Gastroesophageal reflux disease with esophagitis Stable. Diagnosis pulled for medication refill. Continue current medical treatment plan. - NEXIUM 40 MG capsule; Take 1 capsule (40 mg total) by mouth daily. Brand preferred by patient  Dispense: 30 capsule; Refill: 3  7. Psoriatic arthritis [facility]) Referral placed for rheumatology to see if patient may be a candidate for treatment for her arthritis. She has been told a while back that she had RA, but was then told during chemo treatments that it was psoriatic arthritis. Patient is unclear if she has one or both of these diagnosis. She is very interested in other treatment options at this time as her arthritic pains are causing much debilitation.  - nystatin (MYCOSTATIN/NYSTOP) powder; Apply to  affected area 3 times daily  Dispense: 30 g; Refill: 1 - Ambulatory referral to Rheumatology  8. Rheumatoid arthritis involving multiple sites, unspecified rheumatoid factor presence ([place]) See above medical treatment plan. - Ambulatory referral to Rheumatology       Mar Daring, PA-C  [facility]

## 2017-04-26 ENCOUNTER — Telehealth: Payer: Self-pay | Admitting: Physician Assistant

## 2017-04-26 DIAGNOSIS — K2100 Gastro-esophageal reflux disease with esophagitis, without bleeding: Secondary | ICD-10-CM

## 2017-04-26 DIAGNOSIS — M1711 Unilateral primary osteoarthritis, right knee: Secondary | ICD-10-CM

## 2017-04-26 NOTE — Telephone Encounter (Signed)
Patient states that she received a letter stating that she needs a prior authorization on the following medications.  diclofenac sodium (VOLTAREN) 1 % GEL  And   NEXIUM 40 MG capsule  Patient is going to call pharmacy to ask them to fax a prior authorization form.    Patient also needs a refill on diclofenac sodium (VOLTAREN) 1 % GEL.  She states that it is working but she is still having very little pain but is able to sleep and it is helping.  She has been using the amount that is directed but will be out of this medication today.  She states that she has refills on this but is not able to get it because the pharmacy states that she is not due.  She is using it four times a day and is using the amount that it says but it is not lasting 30 days.  [facility] [place].

## 2017-04-30 MED ORDER — DICLOFENAC SODIUM 1 % TD GEL: 2.0000 g | Freq: Four times a day (QID) | TRANSDERMAL | 3 refills | Status: DC

## 2017-04-30 MED ORDER — NEXIUM 40 MG PO CPDR: 40.0000 mg | DELAYED_RELEASE_CAPSULE | Freq: Every day | ORAL | 3 refills | Status: DC

## 2017-04-30 NOTE — Telephone Encounter (Signed)
Sent in refills. Have not received PA information yet but unfortunately fax had been down. I will have Josie keep an eye out so we can get these completed for her.

## 2017-05-02 NOTE — Telephone Encounter (Signed)
Pa form was faxed today.  Thanks,  -[name]

## 2017-05-04 ENCOUNTER — Ambulatory Visit (INDEPENDENT_AMBULATORY_CARE_PROVIDER_SITE_OTHER): Payer: Medicare HMO | Admitting: Urology

## 2017-05-04 ENCOUNTER — Encounter: Payer: Self-pay | Admitting: Urology

## 2017-05-04 VITALS — BP 127/81 | HR 111 | Ht 68.0 in | Wt 238.0 lb

## 2017-05-04 DIAGNOSIS — R339 Retention of urine, unspecified: Secondary | ICD-10-CM | POA: Diagnosis not present

## 2017-05-04 LAB — URINALYSIS, COMPLETE
BILIRUBIN UA: NEGATIVE
Glucose, UA: NEGATIVE
Ketones, UA: NEGATIVE
NITRITE UA: NEGATIVE
Protein, UA: NEGATIVE
SPEC GRAV UA: 1.015 (ref 1.005–1.030)
Urobilinogen, Ur: 0.2 mg/dL (ref 0.2–1.0)
pH, UA: 6 (ref 5.0–7.5)

## 2017-05-04 LAB — MICROSCOPIC EXAMINATION: RBC MICROSCOPIC, UA: NONE SEEN /HPF (ref 0–?)

## 2017-05-04 NOTE — Progress Notes (Signed)
In and Out Catheterization  Patient is present today for a I & O catheterization due to urethral dilation. Patient was cleaned and prepped in a sterile fashion with betadine and Lidocaine 2% jelly was instilled into the urethra.  A 14FR cath was inserted no complications were noted , 483ml of urine return was noted, urine was yellow in color. A clean urine sample was collected for u/a. Bladder was drained  And catheter was removed with out difficulty.    Preformed by: Toniann Fail, LPN

## 2017-05-04 NOTE — Progress Notes (Signed)
Procedure note  62 year old female with a history of incomplete bladder emptying and obstructive voiding symptoms which significantly improve with urethral dilation.  She was last dilated on 03/23/2017.  She presents with a 2-day history of urinary hesitancy, decreased stream, straining to urinate and sensation of incomplete emptying.  She was unable to void for a urine specimen.  She was catheterized for 500 mL of urine which was negative on urinalysis.  Her external genitalia were prepped and draped.  The urethra was dilated from 16-26 [place] without problems.  She received Cipro 500 mg po  Follow-up 1 month with [name] for recheck.

## 2017-05-07 ENCOUNTER — Encounter: Payer: Self-pay | Admitting: Urology

## 2017-05-07 DIAGNOSIS — M1990 Unspecified osteoarthritis, unspecified site: Secondary | ICD-10-CM | POA: Insufficient documentation

## 2017-05-07 DIAGNOSIS — C541 Malignant neoplasm of endometrium: Secondary | ICD-10-CM | POA: Diagnosis not present

## 2017-05-08 ENCOUNTER — Telehealth: Payer: Self-pay | Admitting: Physician Assistant

## 2017-05-08 NOTE — Telephone Encounter (Signed)
Myself and [facility] rheumatology department have tried to contact pt without a return call.Referral has not been made

## 2017-05-08 NOTE — Telephone Encounter (Signed)
Spoke with patient. Reports that she was at the hospital all day yesterday.She reports that she saw her Oncologist and reports that he is fine with her having surgery. She also reports that it has been long day today since she was at the hospital with her brother that has cancer and he was having surgery today. She is asking if you can get in contact Judson Roch) with her in 2 days please.  Thanks,  -[name]

## 2017-05-09 ENCOUNTER — Other Ambulatory Visit: Payer: Self-pay | Admitting: Physician Assistant

## 2017-05-09 DIAGNOSIS — I10 Essential (primary) hypertension: Secondary | ICD-10-CM

## 2017-05-10 ENCOUNTER — Ambulatory Visit: Payer: Self-pay | Admitting: Physician Assistant

## 2017-05-16 ENCOUNTER — Telehealth: Payer: Self-pay | Admitting: Physician Assistant

## 2017-05-16 ENCOUNTER — Ambulatory Visit: Payer: Self-pay | Admitting: Physician Assistant

## 2017-05-16 NOTE — Telephone Encounter (Signed)
Patient had to cancel her appt today because all of her "rides" have cancelled on her in the last few minutes.    She did see her oncologist and they gave her the approval to have a knee replacement.  She will call back to resc her appt with Tawanna Sat when she has a ride.

## 2017-05-16 NOTE — Telephone Encounter (Signed)
Noted  

## 2017-05-24 ENCOUNTER — Ambulatory Visit: Payer: Medicare Other | Admitting: Urology

## 2017-05-30 ENCOUNTER — Telehealth: Payer: Self-pay

## 2017-05-30 NOTE — Telephone Encounter (Signed)
LMTCB

## 2017-05-30 NOTE — Telephone Encounter (Signed)
Pt requesting call back from [place].

## 2017-05-31 NOTE — Telephone Encounter (Signed)
Spoke with patient she is scheduled for Monday she had questions about labs.  Thanks,  -[name]

## 2017-06-04 ENCOUNTER — Encounter: Payer: Self-pay | Admitting: Physician Assistant

## 2017-06-04 ENCOUNTER — Ambulatory Visit: Payer: Self-pay | Admitting: Physician Assistant

## 2017-06-04 ENCOUNTER — Ambulatory Visit: Payer: Medicare HMO | Admitting: Urology

## 2017-06-04 ENCOUNTER — Ambulatory Visit (INDEPENDENT_AMBULATORY_CARE_PROVIDER_SITE_OTHER): Payer: Medicare HMO | Admitting: Physician Assistant

## 2017-06-04 VITALS — BP 128/88 | HR 74 | Temp 98.2°F | Resp 16 | Wt 238.8 lb

## 2017-06-04 DIAGNOSIS — M25562 Pain in left knee: Secondary | ICD-10-CM | POA: Diagnosis not present

## 2017-06-04 DIAGNOSIS — M1711 Unilateral primary osteoarthritis, right knee: Secondary | ICD-10-CM

## 2017-06-04 DIAGNOSIS — M25542 Pain in joints of left hand: Secondary | ICD-10-CM | POA: Diagnosis not present

## 2017-06-04 DIAGNOSIS — R52 Pain, unspecified: Secondary | ICD-10-CM | POA: Diagnosis not present

## 2017-06-04 DIAGNOSIS — M25541 Pain in joints of right hand: Secondary | ICD-10-CM | POA: Diagnosis not present

## 2017-06-04 DIAGNOSIS — F5101 Primary insomnia: Secondary | ICD-10-CM | POA: Diagnosis not present

## 2017-06-04 DIAGNOSIS — R69 Illness, unspecified: Secondary | ICD-10-CM | POA: Diagnosis not present

## 2017-06-04 DIAGNOSIS — M25561 Pain in right knee: Secondary | ICD-10-CM | POA: Diagnosis not present

## 2017-06-04 MED ORDER — ZOLPIDEM TARTRATE 5 MG PO TABS: 5.0000 mg | ORAL_TABLET | Freq: Every evening | ORAL | 5 refills | Status: DC | PRN

## 2017-06-04 MED ORDER — DICLOFENAC SODIUM 1 % TD GEL: 2.0000 g | Freq: Four times a day (QID) | TRANSDERMAL | 3 refills | Status: DC

## 2017-06-04 NOTE — Progress Notes (Signed)
Patient: Jean Gonzales Female    DOB: [birth year]   62 y.o.   MRN: [id number] Visit Date: 06/04/2017  Today's Provider: Mar Daring, PA-C   No chief complaint on file.  Subjective:    HPI Patient is here today because she needs some blood work for that the orthopedic is requiring for her to have.   Depression: Reports that she still not sleeping well. She reports that she try taking the Sertraline and after taking it her head started to hurts and chest and the next day he wasn't feeling well. She also reports she try to take 1/2 of the pill and still made her feel the same way. Reports that she feels this is not so much depression she just needs to sleep.   Arthritis: She reports that she feels that the voltaren gel is working.      Allergies  Allergen Reactions  . Iodinated Diagnostic Agents Anaphylaxis  . Iodine Swelling     Current Outpatient Medications:  .  atorvastatin (LIPITOR) 40 MG tablet, TAKE 1 TABLET (40 MG TOTAL) BY MOUTH DAILY., Disp: 90 tablet, Rfl: 3 .  diclofenac sodium (VOLTAREN) 1 % GEL, Apply 2 g topically 4 (four) times daily., Disp: 100 g, Rfl: 3 .  hydrochlorothiazide (HYDRODIURIL) 25 MG tablet, TAKE 1 TABLET BY MOUTH EVERY DAY, Disp: 90 tablet, Rfl: 1 .  NEXIUM 40 MG capsule, Take 1 capsule (40 mg total) by mouth daily. Brand preferred by patient, Disp: 30 capsule, Rfl: 3 .  tamsulosin (FLOMAX) 0.4 MG CAPS capsule, TAKE 1 CAPSULE (0.4 MG TOTAL) BY MOUTH DAILY., Disp: 90 capsule, Rfl: 3 .  aspirin EC 81 MG tablet, Take 81 mg by mouth., Disp: , Rfl:  .  CALCIUM CARBONATE PO, Take by mouth., Disp: , Rfl:  .  gabapentin (NEURONTIN) 300 MG capsule, Take by mouth., Disp: , Rfl:  .  gemfibrozil (LOPID) 600 MG tablet, Take by mouth., Disp: , Rfl:  .  Multiple Vitamins-Minerals (MULTIVITAMIN PO), Take by mouth., Disp: , Rfl:  .  nystatin (MYCOSTATIN/NYSTOP) powder, Apply to affected area 3 times daily (Patient not taking: Reported on 06/04/2017),  Disp: 30 g, Rfl: 1 .  oxyCODONE-acetaminophen (PERCOCET/ROXICET) 5-325 MG tablet, Take by mouth., Disp: , Rfl:  .  sertraline (ZOLOFT) 50 MG tablet, Take 1 tablet (50 mg total) by mouth daily. (Patient not taking: Reported on 06/04/2017), Disp: 30 tablet, Rfl: 3 .  sulfaSALAzine (AZULFIDINE) 500 MG tablet, Take by mouth., Disp: , Rfl:   Review of Systems  Constitutional: Positive for fatigue.  Respiratory: Negative.   Cardiovascular: Positive for leg swelling. Negative for chest pain and palpitations.  Gastrointestinal: Negative.   Musculoskeletal: Positive for arthralgias, gait problem, joint swelling and myalgias.  Psychiatric/Behavioral: Positive for sleep disturbance. Negative for decreased concentration and dysphoric mood. The patient is not nervous/anxious.     Social History   Tobacco Use  . Smoking status: Never Smoker  . Smokeless tobacco: Never Used  Substance Use Topics  . Alcohol use: No    Alcohol/week: 0.0 oz   Objective:   BP 128/88 (BP Location: Left Arm, Patient Position: Sitting, Cuff Size: Large)   Pulse 74   Temp 98.2 F (36.8 C) (Oral)   Resp 16   Wt 238 lb 12.8 oz (108.3 kg)   SpO2 99%   BMI 36.31 kg/m     Physical Exam  Constitutional: She appears well-developed and well-nourished. No distress.  Neck: Normal range of motion.  Neck supple.  Cardiovascular: Normal rate, regular rhythm and normal heart sounds. Exam reveals no gallop and no friction rub.  No murmur heard. Pulmonary/Chest: Effort normal and breath sounds normal. No respiratory distress. She has no wheezes. She has no rales.  Skin: She is not diaphoretic.  Psychiatric: She has a normal mood and affect. Her behavior is normal. Judgment and thought content normal.  Vitals reviewed.      Assessment & Plan:     1. Inflammatory pain Labs checked for Dr. Mack Guise, orthopedics, so patient could have drawn in our office. I will forward results once received.  - ANA,IFA RA Diag Pnl w/rflx  Tit/Patn - RA Qn+CCP(IgG/A)+SjoSSA+SjoSSB - Uric acid - Antistreptolysin O titer  2. Arthralgia of both hands See above medical treatment plan. - ANA,IFA RA Diag Pnl w/rflx Tit/Patn - RA Qn+CCP(IgG/A)+SjoSSA+SjoSSB - Uric acid - Antistreptolysin O titer  3. Primary osteoarthritis of right knee Stable. Diagnosis pulled for medication refill. Continue current medical treatment plan. - diclofenac sodium (VOLTAREN) 1 % GEL; Apply 2 g topically 4 (four) times daily.  Dispense: 400 g; Refill: 3  4. Primary insomnia Worsening sleep. Failed trazodone, melatonin, sertraline. Will try ambien as below. She is to call if no improvements or if she needs dose adjustments.  - zolpidem (AMBIEN) 5 MG tablet; Take 1 tablet (5 mg total) by mouth at bedtime as needed for sleep.  Dispense: 30 tablet; Refill: [place], PA-C  [facility]

## 2017-06-05 ENCOUNTER — Encounter: Payer: Self-pay | Admitting: Physician Assistant

## 2017-06-05 DIAGNOSIS — M1711 Unilateral primary osteoarthritis, right knee: Secondary | ICD-10-CM | POA: Diagnosis not present

## 2017-06-06 ENCOUNTER — Other Ambulatory Visit: Payer: Self-pay | Admitting: Physician Assistant

## 2017-06-06 ENCOUNTER — Telehealth: Payer: Self-pay | Admitting: Physician Assistant

## 2017-06-06 DIAGNOSIS — E7800 Pure hypercholesterolemia, unspecified: Secondary | ICD-10-CM

## 2017-06-06 LAB — ANA,IFA RA DIAG PNL W/RFLX TIT/PATN: ANA TITER 1: NEGATIVE

## 2017-06-06 LAB — ANTISTREPTOLYSIN O TITER: ASO: 232 IU/mL — ABNORMAL HIGH (ref 0.0–200.0)

## 2017-06-06 LAB — URIC ACID: Uric Acid: 6.7 mg/dL (ref 2.5–7.1)

## 2017-06-06 LAB — RA QN+CCP(IGG/A)+SJOSSA+SJOSSB
CYCLIC CITRULLIN PEPTIDE AB: 7 U (ref 0–19)
ENA SSA (RO) Ab: <0.2 AI (ref 0.0–0.9)
ENA SSB (LA) Ab: <0.2 AI (ref 0.0–0.9)
RHEUMATOID FACTOR: 10.5 [IU]/mL (ref 0.0–13.9)

## 2017-06-06 NOTE — Telephone Encounter (Signed)
Received Medical Clearance form from [facility]. Placed in providers box. Thanks CC

## 2017-06-11 ENCOUNTER — Other Ambulatory Visit: Payer: Self-pay | Admitting: Orthopedic Surgery

## 2017-06-11 DIAGNOSIS — M1711 Unilateral primary osteoarthritis, right knee: Secondary | ICD-10-CM

## 2017-06-13 NOTE — Telephone Encounter (Signed)
Surgical clearance is scheduled for 06/25/17.

## 2017-06-15 ENCOUNTER — Ambulatory Visit: Payer: Medicare Other

## 2017-06-15 ENCOUNTER — Telehealth: Payer: Self-pay | Admitting: Physician Assistant

## 2017-06-15 ENCOUNTER — Ambulatory Visit: Payer: Self-pay | Admitting: Physician Assistant

## 2017-06-15 DIAGNOSIS — F5101 Primary insomnia: Secondary | ICD-10-CM

## 2017-06-15 MED ORDER — ZOLPIDEM TARTRATE 10 MG PO TABS: 10.0000 mg | ORAL_TABLET | Freq: Every evening | ORAL | 0 refills | Status: DC | PRN

## 2017-06-15 NOTE — Telephone Encounter (Signed)
Continue taking 2-5mg  tabs until completed. Will send in new Rx for 10mg  but insurance may not cover early.

## 2017-06-15 NOTE — Telephone Encounter (Signed)
Patient states that you put her on zolpidem (AMBIEN) 5 MG tablet.  She states that she took one tablet two nights and it was like she did not take anything at all.  She started taking two and it seems to be working well.  She states that you told her to call and let you know how it was going and if she needed the 10mg  you would send that in.  [facility] [place]

## 2017-06-15 NOTE — Telephone Encounter (Signed)
Pt advised and agrees with treatment plan. 

## 2017-06-18 ENCOUNTER — Ambulatory Visit
Admission: RE | Admit: 2017-06-18 | Discharge: 2017-06-18 | Disposition: A | Discharge status: Home / Self Care | Payer: Medicare HMO | Source: Ambulatory Visit | Attending: Orthopedic Surgery | Admitting: Orthopedic Surgery

## 2017-06-18 ENCOUNTER — Other Ambulatory Visit: Payer: Self-pay | Admitting: Orthopedic Surgery

## 2017-06-18 DIAGNOSIS — M21061 Valgus deformity, not elsewhere classified, right knee: Secondary | ICD-10-CM | POA: Diagnosis not present

## 2017-06-18 DIAGNOSIS — S83281A Other tear of lateral meniscus, current injury, right knee, initial encounter: Secondary | ICD-10-CM | POA: Diagnosis not present

## 2017-06-18 DIAGNOSIS — M23351 Other meniscus derangements, posterior horn of lateral meniscus, right knee: Secondary | ICD-10-CM | POA: Diagnosis not present

## 2017-06-18 DIAGNOSIS — M25561 Pain in right knee: Principal | ICD-10-CM

## 2017-06-18 DIAGNOSIS — X58XXXA Exposure to other specified factors, initial encounter: Secondary | ICD-10-CM | POA: Diagnosis not present

## 2017-06-18 DIAGNOSIS — S83241A Other tear of medial meniscus, current injury, right knee, initial encounter: Secondary | ICD-10-CM | POA: Insufficient documentation

## 2017-06-18 DIAGNOSIS — G8929 Other chronic pain: Secondary | ICD-10-CM

## 2017-06-18 DIAGNOSIS — M1711 Unilateral primary osteoarthritis, right knee: Secondary | ICD-10-CM | POA: Diagnosis not present

## 2017-06-18 DIAGNOSIS — M23321 Other meniscus derangements, posterior horn of medial meniscus, right knee: Secondary | ICD-10-CM | POA: Diagnosis not present

## 2017-06-25 ENCOUNTER — Ambulatory Visit (INDEPENDENT_AMBULATORY_CARE_PROVIDER_SITE_OTHER): Payer: Medicare HMO | Admitting: Physician Assistant

## 2017-06-25 ENCOUNTER — Ambulatory Visit: Payer: Self-pay | Admitting: Orthopedic Surgery

## 2017-06-25 ENCOUNTER — Encounter: Payer: Self-pay | Admitting: Physician Assistant

## 2017-06-25 VITALS — BP 130/90 | Temp 98.0°F | Resp 16 | Ht 68.0 in | Wt 243.0 lb

## 2017-06-25 DIAGNOSIS — Z01818 Encounter for other preprocedural examination: Secondary | ICD-10-CM

## 2017-06-25 DIAGNOSIS — E0800 Diabetes mellitus due to underlying condition with hyperosmolarity without nonketotic hyperglycemic-hyperosmolar coma (NKHHC): Secondary | ICD-10-CM | POA: Diagnosis not present

## 2017-06-25 DIAGNOSIS — N390 Urinary tract infection, site not specified: Secondary | ICD-10-CM | POA: Diagnosis not present

## 2017-06-25 DIAGNOSIS — R8281 Pyuria: Secondary | ICD-10-CM

## 2017-06-25 LAB — POCT URINALYSIS DIPSTICK
BILIRUBIN UA: NEGATIVE
Blood, UA: NEGATIVE
GLUCOSE UA: NEGATIVE
Ketones, UA: NEGATIVE
Leukocytes, UA: NEGATIVE
NITRITE UA: NEGATIVE
Protein, UA: NEGATIVE
Spec Grav, UA: 1.01 (ref 1.010–1.025)
UROBILINOGEN UA: 0.2 U/dL
pH, UA: 6.5 (ref 5.0–8.0)

## 2017-06-25 LAB — POCT UA - MICROALBUMIN: MICROALBUMIN (UR) POC: 20 mg/L

## 2017-06-25 LAB — POCT GLYCOSYLATED HEMOGLOBIN (HGB A1C): HEMOGLOBIN A1C: 6.2

## 2017-06-25 NOTE — Progress Notes (Signed)
Patient: Jean Gonzales Female    DOB: 06/21/1955   62 y.o.   MRN: [id number] Visit Date: 06/25/2017  Today's Provider: Mar Daring, PA-C   Chief Complaint  Patient presents with  . Pre-op Exam   Subjective:    Patient here for pre-op exam, patient is schedule for right, total knee replacement. Patient is scheduled on 07/09/2017 with Dr. Caryl Bis, with [facility]. Patient denies any abnormal side effects with anesthesia in the past. Patient reports after surgery she will go into rehab for about 3-4 days. She denies any symptoms today of URI/UTI.   Allergies  Allergen Reactions  . Iodinated Diagnostic Agents Anaphylaxis  . Iodine Swelling     Current Outpatient Medications:  .  atorvastatin (LIPITOR) 40 MG tablet, TAKE 1 TABLET BY MOUTH EVERY DAY, Disp: 90 tablet, Rfl: 1 .  celecoxib (CELEBREX) 100 MG capsule, Take 100 mg by mouth 2 (two) times daily., Disp: , Rfl: 5 .  diclofenac sodium (VOLTAREN) 1 % GEL, Apply 2 g topically 4 (four) times daily., Disp: 400 g, Rfl: 3 .  NEXIUM 40 MG capsule, Take 1 capsule (40 mg total) by mouth daily. Brand preferred by patient, Disp: 30 capsule, Rfl: 3 .  PREMARIN vaginal cream, APPLY SMALL AMOUNT TO URETHRA DAILY, Disp: , Rfl: 1 .  tamsulosin (FLOMAX) 0.4 MG CAPS capsule, TAKE 1 CAPSULE (0.4 MG TOTAL) BY MOUTH DAILY., Disp: 90 capsule, Rfl: 3 .  zolpidem (AMBIEN) 10 MG tablet, Take 1 tablet (10 mg total) by mouth at bedtime as needed for sleep. OK to fill early; titrating dose, Disp: 30 tablet, Rfl: 0  Review of Systems  Constitutional: Negative.   HENT: Negative.   Respiratory: Negative.   Cardiovascular: Negative.   Gastrointestinal: Negative.   Genitourinary: Negative.   Musculoskeletal: Positive for arthralgias.    Social History   Tobacco Use  . Smoking status: Never Smoker  . Smokeless tobacco: Never Used  Substance Use Topics  . Alcohol use: No    Alcohol/week: 0.0 oz   Objective:   BP 130/90 (BP  Location: Right Arm, Patient Position: Sitting, Cuff Size: Large)   Temp 98 F (36.7 C) (Oral)   Resp 16   Ht 5\' 8"  (1.727 m)   Wt 243 lb (110.2 kg)   BMI 36.95 kg/m  Vitals:   06/25/17 1539  BP: 130/90  Resp: 16  Temp: 98 F (36.7 C)  TempSrc: Oral  Weight: 243 lb (110.2 kg)  Height: 5\' 8"  (1.727 m)     Physical Exam  Constitutional: She appears well-developed and well-nourished. No distress.  Neck: Normal range of motion. Neck supple. No tracheal deviation present. No thyromegaly present.  Cardiovascular: Normal rate, regular rhythm and normal heart sounds. Exam reveals no gallop and no friction rub.  No murmur heard. Pulmonary/Chest: Effort normal and breath sounds normal. No respiratory distress. She has no wheezes. She has no rales.  Abdominal: Soft. Bowel sounds are normal. She exhibits no distension. There is no tenderness.  Lymphadenopathy:    She has no cervical adenopathy.  Skin: She is not diaphoretic.  Psychiatric: She has a normal mood and affect. Her behavior is normal. Judgment and thought content normal.  Vitals reviewed.      Assessment & Plan:     1. Pre-op exam EKG today showed NSR rate of 70 without ST changes. UA today had moderate leuks. Will send urine for culture to r/o infection. Labs ordered as below for  surgical pre-op. A1c stable and well controlled at 6.2. Surgical risk is low at 0.15% for cardiac complications using the [place] and is low risk for non-cardiac adverse outcome using Lee Criteria.  - EKG 12-Lead - POCT urinalysis dipstick - CBC w/Diff/Platelet - Basic Metabolic Panel (BMET) - Albumin  2. Diabetes mellitus due to underlying condition with hyperosmolarity without coma, unspecified whether long term insulin use ([place]) See above medical treatment plan. - POCT glycosylated hemoglobin (Hb A1C) - POCT UA - Microalbumin - CBC w/Diff/Platelet - Basic Metabolic Panel (BMET) - Albumin  3. Pyuria Urine culture  showed no bacterial growth.  - Urine Culture       Mar Daring, PA-C  [facility]

## 2017-06-26 ENCOUNTER — Telehealth: Payer: Self-pay

## 2017-06-26 DIAGNOSIS — M1711 Unilateral primary osteoarthritis, right knee: Secondary | ICD-10-CM | POA: Diagnosis not present

## 2017-06-26 LAB — BASIC METABOLIC PANEL
BUN / CREAT RATIO: 13 (ref 12–28)
BUN: 14 mg/dL (ref 8–27)
CALCIUM: 9.5 mg/dL (ref 8.7–10.3)
CHLORIDE: 105 mmol/L (ref 96–106)
CO2: 21 mmol/L (ref 20–29)
CREATININE: 1.04 mg/dL — AB (ref 0.57–1.00)
GFR, EST AFRICAN AMERICAN: 66 mL/min/{1.73_m2} (ref >59)
GFR, EST NON AFRICAN AMERICAN: 57 mL/min/{1.73_m2} — AB (ref >59)
Glucose: 123 mg/dL — ABNORMAL HIGH (ref 65–99)
Potassium: 3.8 mmol/L (ref 3.5–5.2)
Sodium: 147 mmol/L — ABNORMAL HIGH (ref 134–144)

## 2017-06-26 LAB — CBC WITH DIFFERENTIAL/PLATELET
BASOS ABS: 0 10*3/uL (ref 0.0–0.2)
Basos: 0 %
EOS (ABSOLUTE): 0.2 10*3/uL (ref 0.0–0.4)
EOS: 1 %
HEMATOCRIT: 38.6 % (ref 34.0–46.6)
Hemoglobin: 12.8 g/dL (ref 11.1–15.9)
IMMATURE GRANULOCYTES: 0 %
Immature Grans (Abs): 0 10*3/uL (ref 0.0–0.1)
Lymphocytes Absolute: 3.5 10*3/uL — ABNORMAL HIGH (ref 0.7–3.1)
Lymphs: 30 %
MCH: 29.2 pg (ref 26.6–33.0)
MCHC: 33.2 g/dL (ref 31.5–35.7)
MCV: 88 fL (ref 79–97)
MONOCYTES: 6 %
MONOS ABS: 0.7 10*3/uL (ref 0.1–0.9)
Neutrophils Absolute: 7.2 10*3/uL — ABNORMAL HIGH (ref 1.4–7.0)
Neutrophils: 63 %
Platelets: 277 10*3/uL (ref 150–379)
RBC: 4.39 x10E6/uL (ref 3.77–5.28)
RDW: 13.6 % (ref 12.3–15.4)
WBC: 11.6 10*3/uL — AB (ref 3.4–10.8)

## 2017-06-26 LAB — ALBUMIN: Albumin: 4.3 g/dL (ref 3.6–4.8)

## 2017-06-26 NOTE — Telephone Encounter (Signed)
-----   Message from Mar Daring, PA-C sent at 06/26/2017  8:22 AM EDT ----- WBC count slightly up which may be associated with possible UTI. I will await cultures and treat accordingly. Kidney function up slightly. This is normally associated with slight dehydration. Keep up good water intake. Sodium borderline high at 147 (cutoff is 145) so limit sodium in diet. Albumin normal.

## 2017-06-26 NOTE — Telephone Encounter (Signed)
Patient advised as below. Patient verbalizes understanding and is in agreement with treatment plan.  

## 2017-06-27 ENCOUNTER — Telehealth: Payer: Self-pay | Admitting: Physician Assistant

## 2017-06-27 ENCOUNTER — Telehealth: Payer: Self-pay

## 2017-06-27 ENCOUNTER — Encounter: Payer: Self-pay | Admitting: Physician Assistant

## 2017-06-27 LAB — URINE CULTURE: ORGANISM ID, BACTERIA: NO GROWTH

## 2017-06-27 NOTE — Telephone Encounter (Signed)
-----   Message from Mar Daring, PA-C sent at 06/27/2017  8:28 AM EDT ----- Urine culture showed no bacterial growth. Are you having any URI symptoms?

## 2017-06-27 NOTE — Telephone Encounter (Signed)
Patient advised as below. Patient denies any URI symptoms

## 2017-06-27 NOTE — Telephone Encounter (Signed)
Received [facility] Medical Clearance form. Placed form in providers box. Thanks CC ° °

## 2017-06-28 NOTE — Telephone Encounter (Signed)
This was faxed this morning.  Thanks,  -[name]

## 2017-06-28 NOTE — Telephone Encounter (Signed)
complete

## 2017-07-04 ENCOUNTER — Other Ambulatory Visit: Payer: Self-pay

## 2017-07-04 ENCOUNTER — Encounter
Admission: RE | Admit: 2017-07-04 | Discharge: 2017-07-04 | Disposition: A | Discharge status: Home / Self Care | Payer: Medicare HMO | Source: Ambulatory Visit | Attending: Orthopedic Surgery | Admitting: Orthopedic Surgery

## 2017-07-04 DIAGNOSIS — M1711 Unilateral primary osteoarthritis, right knee: Secondary | ICD-10-CM | POA: Diagnosis not present

## 2017-07-04 DIAGNOSIS — Z79891 Long term (current) use of opiate analgesic: Secondary | ICD-10-CM | POA: Insufficient documentation

## 2017-07-04 DIAGNOSIS — E118 Type 2 diabetes mellitus with unspecified complications: Secondary | ICD-10-CM | POA: Insufficient documentation

## 2017-07-04 DIAGNOSIS — M25541 Pain in joints of right hand: Secondary | ICD-10-CM | POA: Insufficient documentation

## 2017-07-04 DIAGNOSIS — Z79899 Other long term (current) drug therapy: Secondary | ICD-10-CM | POA: Insufficient documentation

## 2017-07-04 DIAGNOSIS — Z01812 Encounter for preprocedural laboratory examination: Secondary | ICD-10-CM | POA: Insufficient documentation

## 2017-07-04 DIAGNOSIS — F5101 Primary insomnia: Secondary | ICD-10-CM | POA: Diagnosis not present

## 2017-07-04 DIAGNOSIS — R69 Illness, unspecified: Secondary | ICD-10-CM | POA: Diagnosis not present

## 2017-07-04 DIAGNOSIS — M25542 Pain in joints of left hand: Secondary | ICD-10-CM | POA: Insufficient documentation

## 2017-07-04 LAB — TYPE AND SCREEN
ABO/RH(D): O POS
Antibody Screen: NEGATIVE

## 2017-07-04 LAB — PROTIME-INR
INR: 0.94
Prothrombin Time: 12.5 seconds (ref 11.4–15.2)

## 2017-07-04 LAB — SURGICAL PCR SCREEN
MRSA, PCR: NEGATIVE
STAPHYLOCOCCUS AUREUS: POSITIVE — AB

## 2017-07-04 LAB — APTT: aPTT: 28 seconds (ref 24–36)

## 2017-07-04 NOTE — Patient Instructions (Signed)
Your procedure is scheduled on:  Monday, July 09, 2017 Report to Day Surgery on the 2nd floor of the [facility]. To find out your arrival time, please call [phone] between 1PM - 3PM on: Friday, July 06, 2017  REMEMBER: Instructions that are not followed completely may result in serious medical risk, up to and including death; or upon the discretion of your surgeon and anesthesiologist your surgery may need to be rescheduled.  Do not eat food after midnight the night before your procedure.  No gum chewing, lozengers or hard candies.  You may however, drink CLEAR liquids up to 2 hours before you are scheduled to arrive for your surgery. Do not drink anything within 2 hours of the start of your surgery.  Clear liquids include: - water  - apple juice without pulp - clear gatorade - black coffee or tea (Do NOT add anything to the coffee or tea) Do NOT drink anything that is not on this list.  No Alcohol for 24 hours before or after surgery.  No Smoking including e-cigarettes for 24 hours prior to surgery.  No chewable tobacco products for at least 6 hours prior to surgery.  No nicotine patches on the day of surgery.  On the morning of surgery brush your teeth with toothpaste and water, you may rinse your mouth with mouthwash if you wish. Do not swallow any toothpaste or mouthwash.  Notify your doctor if there is any change in your medical condition (cold, fever, infection).  Do not wear jewelry, make-up, hairpins, clips or nail polish.  Do not wear lotions, powders, or perfumes. You may wear deodorant.  Do not shave 48 hours prior to surgery.   Contacts and dentures may not be worn into surgery.  Do not bring valuables to the hospital, including drivers license, insurance or credit cards.  [facility] is not responsible for any belongings or valuables.   TAKE THESE MEDICATIONS THE MORNING OF SURGERY:  1.  nexium (take one the night before surgery and one the morning  of surgery - helps prevent nausea after surgery)  Use CHG Soap as directed on instruction sheet.  NOW!  Stop Anti-inflammatories (NSAIDS) such as CELEBREX, Advil, Aleve, Ibuprofen, Motrin, Naproxen, Naprosyn and Aspirin based products such as Excedrin, Goodys Powder, BC Powder. (May take Tylenol or Acetaminophen if needed.)  Stop ANY OVER THE COUNTER supplements until after surgery. (TURMERIC)  Wear comfortable clothing (specific to your surgery type) to the hospital.  Plan for stool softeners for home use.  If you are being admitted to the hospital overnight, leave your suitcase in the car. After surgery it may be brought to your room.  Please call [phone] if you have any questions about these instructions.

## 2017-07-08 MED ORDER — CEFAZOLIN SODIUM-DEXTROSE 2-4 GM/100ML-% IV SOLN
2.0000 g | INTRAVENOUS | Status: AC
  Administered 2017-07-09: 2 g via INTRAVENOUS
  Filled 2017-07-08: qty 100

## 2017-07-08 MED ORDER — TRANEXAMIC ACID 1000 MG/10ML IV SOLN
1000.0000 mg | INTRAVENOUS | Status: AC
  Administered 2017-07-09: 1000 mg via INTRAVENOUS
  Filled 2017-07-08: qty 10

## 2017-07-09 ENCOUNTER — Other Ambulatory Visit: Payer: Self-pay

## 2017-07-09 ENCOUNTER — Encounter
Admission: RE | Disposition: A | Discharge status: Skilled Nursing Facility | Payer: Self-pay | Source: Ambulatory Visit | Attending: Orthopedic Surgery

## 2017-07-09 ENCOUNTER — Inpatient Hospital Stay: Payer: Medicare HMO | Admitting: Anesthesiology

## 2017-07-09 ENCOUNTER — Inpatient Hospital Stay: Payer: Medicare HMO

## 2017-07-09 ENCOUNTER — Inpatient Hospital Stay
Admission: RE | Admit: 2017-07-09 | Discharge: 2017-07-16 | DRG: 470 | Disposition: A | Discharge status: Skilled Nursing Facility | Payer: Medicare HMO | Source: Ambulatory Visit | Attending: Orthopedic Surgery | Admitting: Orthopedic Surgery

## 2017-07-09 DIAGNOSIS — M069 Rheumatoid arthritis, unspecified: Secondary | ICD-10-CM | POA: Diagnosis present

## 2017-07-09 DIAGNOSIS — Z9221 Personal history of antineoplastic chemotherapy: Secondary | ICD-10-CM | POA: Diagnosis not present

## 2017-07-09 DIAGNOSIS — R52 Pain, unspecified: Secondary | ICD-10-CM | POA: Diagnosis not present

## 2017-07-09 DIAGNOSIS — L4050 Arthropathic psoriasis, unspecified: Secondary | ICD-10-CM | POA: Diagnosis not present

## 2017-07-09 DIAGNOSIS — Z90710 Acquired absence of both cervix and uterus: Secondary | ICD-10-CM | POA: Diagnosis not present

## 2017-07-09 DIAGNOSIS — M1711 Unilateral primary osteoarthritis, right knee: Secondary | ICD-10-CM | POA: Diagnosis not present

## 2017-07-09 DIAGNOSIS — R109 Unspecified abdominal pain: Secondary | ICD-10-CM | POA: Diagnosis not present

## 2017-07-09 DIAGNOSIS — Z79890 Hormone replacement therapy (postmenopausal): Secondary | ICD-10-CM | POA: Diagnosis not present

## 2017-07-09 DIAGNOSIS — K5900 Constipation, unspecified: Secondary | ICD-10-CM | POA: Diagnosis not present

## 2017-07-09 DIAGNOSIS — Z09 Encounter for follow-up examination after completed treatment for conditions other than malignant neoplasm: Secondary | ICD-10-CM

## 2017-07-09 DIAGNOSIS — Z923 Personal history of irradiation: Secondary | ICD-10-CM

## 2017-07-09 DIAGNOSIS — R2689 Other abnormalities of gait and mobility: Secondary | ICD-10-CM | POA: Diagnosis not present

## 2017-07-09 DIAGNOSIS — R339 Retention of urine, unspecified: Secondary | ICD-10-CM | POA: Diagnosis not present

## 2017-07-09 DIAGNOSIS — E785 Hyperlipidemia, unspecified: Secondary | ICD-10-CM | POA: Diagnosis present

## 2017-07-09 DIAGNOSIS — I10 Essential (primary) hypertension: Secondary | ICD-10-CM | POA: Diagnosis present

## 2017-07-09 DIAGNOSIS — E119 Type 2 diabetes mellitus without complications: Secondary | ICD-10-CM | POA: Diagnosis present

## 2017-07-09 DIAGNOSIS — Z8249 Family history of ischemic heart disease and other diseases of the circulatory system: Secondary | ICD-10-CM

## 2017-07-09 DIAGNOSIS — N132 Hydronephrosis with renal and ureteral calculous obstruction: Secondary | ICD-10-CM | POA: Diagnosis not present

## 2017-07-09 DIAGNOSIS — Z833 Family history of diabetes mellitus: Secondary | ICD-10-CM | POA: Diagnosis not present

## 2017-07-09 DIAGNOSIS — M6281 Muscle weakness (generalized): Secondary | ICD-10-CM | POA: Diagnosis not present

## 2017-07-09 DIAGNOSIS — Z8261 Family history of arthritis: Secondary | ICD-10-CM

## 2017-07-09 DIAGNOSIS — Z96651 Presence of right artificial knee joint: Secondary | ICD-10-CM | POA: Diagnosis not present

## 2017-07-09 DIAGNOSIS — Z8542 Personal history of malignant neoplasm of other parts of uterus: Secondary | ICD-10-CM

## 2017-07-09 DIAGNOSIS — K219 Gastro-esophageal reflux disease without esophagitis: Secondary | ICD-10-CM | POA: Diagnosis not present

## 2017-07-09 DIAGNOSIS — Z471 Aftercare following joint replacement surgery: Secondary | ICD-10-CM | POA: Diagnosis not present

## 2017-07-09 DIAGNOSIS — R69 Illness, unspecified: Secondary | ICD-10-CM | POA: Diagnosis not present

## 2017-07-09 DIAGNOSIS — M25561 Pain in right knee: Secondary | ICD-10-CM | POA: Diagnosis not present

## 2017-07-09 DIAGNOSIS — Z7401 Bed confinement status: Secondary | ICD-10-CM | POA: Diagnosis not present

## 2017-07-09 DIAGNOSIS — R0789 Other chest pain: Secondary | ICD-10-CM | POA: Diagnosis not present

## 2017-07-09 DIAGNOSIS — Z9049 Acquired absence of other specified parts of digestive tract: Secondary | ICD-10-CM | POA: Diagnosis not present

## 2017-07-09 DIAGNOSIS — K5730 Diverticulosis of large intestine without perforation or abscess without bleeding: Secondary | ICD-10-CM | POA: Diagnosis not present

## 2017-07-09 DIAGNOSIS — N1330 Unspecified hydronephrosis: Secondary | ICD-10-CM | POA: Diagnosis not present

## 2017-07-09 DIAGNOSIS — Z96659 Presence of unspecified artificial knee joint: Secondary | ICD-10-CM | POA: Diagnosis not present

## 2017-07-09 HISTORY — PX: TOTAL KNEE ARTHROPLASTY: SHX125

## 2017-07-09 LAB — GLUCOSE, CAPILLARY
Glucose-Capillary: 116 mg/dL — ABNORMAL HIGH (ref 65–99)
Glucose-Capillary: 96 mg/dL (ref 65–99)

## 2017-07-09 LAB — ABO/RH: ABO/RH(D): O POS

## 2017-07-09 SURGERY — ARTHROPLASTY, KNEE, TOTAL
Anesthesia: Spinal | Laterality: Right

## 2017-07-09 MED ORDER — FENTANYL CITRATE (PF) 100 MCG/2ML IJ SOLN: 25.0000 ug | INTRAMUSCULAR | Status: DC | PRN

## 2017-07-09 MED ORDER — SODIUM CHLORIDE 0.9 % IV SOLN
INTRAVENOUS | Status: DC | PRN
  Administered 2017-07-09: 25 ug/min via INTRAVENOUS

## 2017-07-09 MED ORDER — MENTHOL 3 MG MT LOZG
1.0000 | LOZENGE | OROMUCOSAL | Status: DC | PRN
  Filled 2017-07-09: qty 9

## 2017-07-09 MED ORDER — CEFAZOLIN SODIUM-DEXTROSE 2-4 GM/100ML-% IV SOLN
2.0000 g | Freq: Four times a day (QID) | INTRAVENOUS | Status: AC
  Administered 2017-07-09 (×2): 2 g via INTRAVENOUS
  Filled 2017-07-09 (×2): qty 100

## 2017-07-09 MED ORDER — BISACODYL 10 MG RE SUPP
10.0000 mg | Freq: Every day | RECTAL | Status: DC | PRN
  Administered 2017-07-12: 10 mg via RECTAL
  Filled 2017-07-09: qty 1

## 2017-07-09 MED ORDER — BUPIVACAINE LIPOSOME 1.3 % IJ SUSP
INTRAMUSCULAR | Status: AC
  Filled 2017-07-09: qty 20

## 2017-07-09 MED ORDER — OXYCODONE HCL 5 MG/5ML PO SOLN: 5.0000 mg | Freq: Once | ORAL | Status: DC | PRN

## 2017-07-09 MED ORDER — KETOROLAC TROMETHAMINE 15 MG/ML IJ SOLN
15.0000 mg | Freq: Four times a day (QID) | INTRAMUSCULAR | Status: AC
  Administered 2017-07-09 (×3): 15 mg via INTRAVENOUS
  Filled 2017-07-09 (×4): qty 1

## 2017-07-09 MED ORDER — PROPOFOL 500 MG/50ML IV EMUL
INTRAVENOUS | Status: DC | PRN
  Administered 2017-07-09: 50 ug/kg/min via INTRAVENOUS

## 2017-07-09 MED ORDER — BUPIVACAINE-EPINEPHRINE (PF) 0.5% -1:200000 IJ SOLN
INTRAMUSCULAR | Status: AC
  Filled 2017-07-09: qty 30

## 2017-07-09 MED ORDER — MIDAZOLAM HCL 2 MG/2ML IJ SOLN
INTRAMUSCULAR | Status: AC
Start: 2017-07-09 — End: 2017-07-09
  Filled 2017-07-09: qty 2

## 2017-07-09 MED ORDER — ACETAMINOPHEN 500 MG PO TABS
500.0000 mg | ORAL_TABLET | Freq: Four times a day (QID) | ORAL | Status: AC
  Administered 2017-07-09 (×3): 500 mg via ORAL
  Filled 2017-07-09 (×4): qty 1

## 2017-07-09 MED ORDER — HYDROCODONE-ACETAMINOPHEN 5-325 MG PO TABS
1.0000 | ORAL_TABLET | ORAL | Status: DC | PRN
  Administered 2017-07-09 (×2): 2 via ORAL
  Administered 2017-07-10: 1 via ORAL
  Administered 2017-07-10 (×2): 2 via ORAL
  Administered 2017-07-10: 1 via ORAL
  Administered 2017-07-11 – 2017-07-13 (×11): 2 via ORAL
  Administered 2017-07-13: 1 via ORAL
  Administered 2017-07-13: 2 via ORAL
  Administered 2017-07-14: 1 via ORAL
  Administered 2017-07-14: 2 via ORAL
  Administered 2017-07-15: 1 via ORAL
  Administered 2017-07-15 (×2): 2 via ORAL
  Administered 2017-07-16: 1 via ORAL
  Filled 2017-07-09 (×12): qty 2
  Filled 2017-07-09 (×2): qty 1
  Filled 2017-07-09 (×6): qty 2
  Filled 2017-07-09: qty 1
  Filled 2017-07-09: qty 2
  Filled 2017-07-09 (×3): qty 1

## 2017-07-09 MED ORDER — PROPOFOL 500 MG/50ML IV EMUL
INTRAVENOUS | Status: AC
  Filled 2017-07-09: qty 50

## 2017-07-09 MED ORDER — FENTANYL CITRATE (PF) 100 MCG/2ML IJ SOLN
INTRAMUSCULAR | Status: AC
  Filled 2017-07-09: qty 2

## 2017-07-09 MED ORDER — BUPIVACAINE HCL (PF) 0.5 % IJ SOLN
INTRAMUSCULAR | Status: DC | PRN
  Administered 2017-07-09: 3 mL

## 2017-07-09 MED ORDER — DOCUSATE SODIUM 100 MG PO CAPS
100.0000 mg | ORAL_CAPSULE | Freq: Two times a day (BID) | ORAL | Status: DC
  Administered 2017-07-09 – 2017-07-15 (×13): 100 mg via ORAL
  Filled 2017-07-09 (×13): qty 1

## 2017-07-09 MED ORDER — SODIUM CHLORIDE 0.9 % IV SOLN
INTRAVENOUS | Status: DC
  Administered 2017-07-09: 09:00:00 via INTRAVENOUS
  Administered 2017-07-09: 1000 mL via INTRAVENOUS

## 2017-07-09 MED ORDER — ONDANSETRON HCL 4 MG/2ML IJ SOLN
4.0000 mg | Freq: Four times a day (QID) | INTRAMUSCULAR | Status: DC | PRN
  Administered 2017-07-10 – 2017-07-12 (×2): 4 mg via INTRAVENOUS
  Filled 2017-07-09 (×2): qty 2

## 2017-07-09 MED ORDER — ZOLPIDEM TARTRATE 5 MG PO TABS: 10.0000 mg | ORAL_TABLET | Freq: Every evening | ORAL | Status: DC | PRN

## 2017-07-09 MED ORDER — HYDROCODONE-ACETAMINOPHEN 7.5-325 MG PO TABS
1.0000 | ORAL_TABLET | ORAL | Status: DC | PRN
  Administered 2017-07-10: 2 via ORAL
  Filled 2017-07-09: qty 2
  Filled 2017-07-09: qty 1

## 2017-07-09 MED ORDER — BUPIVACAINE-EPINEPHRINE (PF) 0.5% -1:200000 IJ SOLN
INTRAMUSCULAR | Status: DC | PRN
  Administered 2017-07-09: 30 mL via PERINEURAL

## 2017-07-09 MED ORDER — ACETAMINOPHEN 325 MG PO TABS
325.0000 mg | ORAL_TABLET | Freq: Four times a day (QID) | ORAL | Status: DC | PRN
  Administered 2017-07-14: 650 mg via ORAL
  Filled 2017-07-09: qty 2

## 2017-07-09 MED ORDER — GABAPENTIN 300 MG PO CAPS
ORAL_CAPSULE | ORAL | Status: AC
  Administered 2017-07-09: 300 mg via ORAL
  Filled 2017-07-09: qty 1

## 2017-07-09 MED ORDER — MIDAZOLAM HCL 5 MG/5ML IJ SOLN
INTRAMUSCULAR | Status: DC | PRN
  Administered 2017-07-09: 2 mg via INTRAVENOUS

## 2017-07-09 MED ORDER — MORPHINE SULFATE (PF) 2 MG/ML IV SOLN
0.5000 mg | INTRAVENOUS | Status: DC | PRN
  Administered 2017-07-13 (×2): 1 mg via INTRAVENOUS
  Filled 2017-07-09 (×2): qty 1

## 2017-07-09 MED ORDER — ONDANSETRON HCL 4 MG PO TABS: 4.0000 mg | ORAL_TABLET | Freq: Four times a day (QID) | ORAL | Status: DC | PRN

## 2017-07-09 MED ORDER — MIDAZOLAM HCL 2 MG/2ML IJ SOLN
INTRAMUSCULAR | Status: AC
  Filled 2017-07-09: qty 2

## 2017-07-09 MED ORDER — ACETAMINOPHEN 500 MG PO TABS
1000.0000 mg | ORAL_TABLET | Freq: Once | ORAL | Status: AC
  Administered 2017-07-09: 1000 mg via ORAL

## 2017-07-09 MED ORDER — LACTATED RINGERS IV SOLN
INTRAVENOUS | Status: DC
  Administered 2017-07-09: 13:00:00 via INTRAVENOUS

## 2017-07-09 MED ORDER — PROPOFOL 10 MG/ML IV BOLUS
INTRAVENOUS | Status: DC | PRN
  Administered 2017-07-09: 50 mg via INTRAVENOUS

## 2017-07-09 MED ORDER — SUCCINYLCHOLINE CHLORIDE 20 MG/ML IJ SOLN
INTRAMUSCULAR | Status: AC
  Filled 2017-07-09: qty 1

## 2017-07-09 MED ORDER — ESMOLOL HCL 100 MG/10ML IV SOLN
INTRAVENOUS | Status: AC
  Filled 2017-07-09: qty 10

## 2017-07-09 MED ORDER — ACETAMINOPHEN 500 MG PO TABS
ORAL_TABLET | ORAL | Status: AC
  Administered 2017-07-09: 1000 mg via ORAL
  Filled 2017-07-09: qty 2

## 2017-07-09 MED ORDER — METOCLOPRAMIDE HCL 10 MG PO TABS: 5.0000 mg | ORAL_TABLET | Freq: Three times a day (TID) | ORAL | Status: DC | PRN

## 2017-07-09 MED ORDER — CEFAZOLIN SODIUM-DEXTROSE 2-3 GM-%(50ML) IV SOLR
INTRAVENOUS | Status: AC
  Filled 2017-07-09: qty 50

## 2017-07-09 MED ORDER — GABAPENTIN 300 MG PO CAPS
300.0000 mg | ORAL_CAPSULE | Freq: Three times a day (TID) | ORAL | Status: DC
  Administered 2017-07-09 – 2017-07-15 (×19): 300 mg via ORAL
  Filled 2017-07-09 (×20): qty 1

## 2017-07-09 MED ORDER — ATORVASTATIN CALCIUM 20 MG PO TABS
40.0000 mg | ORAL_TABLET | Freq: Every day | ORAL | Status: DC
  Administered 2017-07-09 – 2017-07-15 (×7): 40 mg via ORAL
  Filled 2017-07-09 (×7): qty 2

## 2017-07-09 MED ORDER — OXYCODONE HCL 5 MG PO TABS: 5.0000 mg | ORAL_TABLET | Freq: Once | ORAL | Status: DC | PRN

## 2017-07-09 MED ORDER — PHENOL 1.4 % MT LIQD
1.0000 | OROMUCOSAL | Status: DC | PRN
  Filled 2017-07-09: qty 177

## 2017-07-09 MED ORDER — MAGNESIUM CITRATE PO SOLN
1.0000 | Freq: Once | ORAL | Status: AC | PRN
  Administered 2017-07-12: 1 via ORAL
  Filled 2017-07-09 (×2): qty 296

## 2017-07-09 MED ORDER — SODIUM CHLORIDE 0.9 % IJ SOLN
INTRAMUSCULAR | Status: AC
  Filled 2017-07-09: qty 50

## 2017-07-09 MED ORDER — METOCLOPRAMIDE HCL 5 MG/ML IJ SOLN: 5.0000 mg | Freq: Three times a day (TID) | INTRAMUSCULAR | Status: DC | PRN

## 2017-07-09 MED ORDER — CHLORHEXIDINE GLUCONATE 4 % EX LIQD: 60.0000 mL | Freq: Once | CUTANEOUS | Status: DC

## 2017-07-09 MED ORDER — BACITRACIN 50000 UNITS IM SOLR
INTRAMUSCULAR | Status: DC | PRN
  Administered 2017-07-09: 50000 [IU]

## 2017-07-09 MED ORDER — BUPIVACAINE LIPOSOME 1.3 % IJ SUSP
INTRAMUSCULAR | Status: DC | PRN
  Administered 2017-07-09: 90 mL

## 2017-07-09 MED ORDER — PANTOPRAZOLE SODIUM 40 MG PO TBEC
40.0000 mg | DELAYED_RELEASE_TABLET | Freq: Every day | ORAL | Status: DC
  Administered 2017-07-10 – 2017-07-14 (×5): 40 mg via ORAL
  Filled 2017-07-09 (×5): qty 1

## 2017-07-09 MED ORDER — TRAMADOL HCL 50 MG PO TABS
50.0000 mg | ORAL_TABLET | Freq: Four times a day (QID) | ORAL | Status: DC
  Administered 2017-07-09 – 2017-07-16 (×16): 50 mg via ORAL
  Filled 2017-07-09 (×17): qty 1

## 2017-07-09 MED ORDER — GABAPENTIN 300 MG PO CAPS
300.0000 mg | ORAL_CAPSULE | Freq: Once | ORAL | Status: AC
  Administered 2017-07-09: 300 mg via ORAL

## 2017-07-09 MED ORDER — ASPIRIN EC 325 MG PO TBEC
325.0000 mg | DELAYED_RELEASE_TABLET | Freq: Every day | ORAL | Status: DC
  Administered 2017-07-10 – 2017-07-16 (×7): 325 mg via ORAL
  Filled 2017-07-09 (×7): qty 1

## 2017-07-09 SURGICAL SUPPLY — 65 items
BASEPLATE TIBIAL SZ 3 (Knees) IMPLANT
BLADE SAW 1 (BLADE) ×2 IMPLANT
BLADE SAW 1/2 (BLADE) ×1 IMPLANT
BLADE SAW SAG 25X90X1.19 (BLADE) ×1 IMPLANT
BOWL CEMENT MIX W/ADAPTER (MISCELLANEOUS) ×2 IMPLANT
BRUSH SCRUB EZ  4% CHG (MISCELLANEOUS) ×1
BRUSH SCRUB EZ 4% CHG (MISCELLANEOUS) ×2 IMPLANT
BSPLAT TIB 3 CMPNT KN RT NPOR (Knees) ×1 IMPLANT
CANISTER SUCT 1200ML W/VALVE (MISCELLANEOUS) ×2 IMPLANT
CANISTER SUCT 3000ML PPV (MISCELLANEOUS) ×4 IMPLANT
CEMENT BONE 1-PACK (Cement) ×4 IMPLANT
CHLORAPREP W/TINT 26ML (MISCELLANEOUS) ×3 IMPLANT
COMP PATELLA FENESIS 32 OVAL (Stem) ×2 IMPLANT
COMPONENT PTLLA GENS 32 OVAL (Stem) IMPLANT
COOLER POLAR GLACIER W/PUMP (MISCELLANEOUS) ×2 IMPLANT
CUFF TOURN 30 STER DUAL PORT (MISCELLANEOUS) ×2 IMPLANT
DRAPE INCISE IOBAN 66X60 STRL (DRAPES) ×2 IMPLANT
DRAPE SHEET LG 3/4 BI-LAMINATE (DRAPES) ×2 IMPLANT
DRSG AQUACEL AG ADV 3.5X14 (GAUZE/BANDAGES/DRESSINGS) ×2 IMPLANT
ELECT REM PT RETURN 9FT ADLT (ELECTROSURGICAL) ×2
ELECTRODE REM PT RTRN 9FT ADLT (ELECTROSURGICAL) ×1 IMPLANT
FEM COMP OXINIUM 5N RT NARROW (Orthopedic Implant) ×2 IMPLANT
FEMORAL COMP OXINIUM 5N RT NRW (Orthopedic Implant) IMPLANT
GAUZE PETRO XEROFOAM 1X8 (MISCELLANEOUS) ×1 IMPLANT
GAUZE XEROFORM 4X4 STRL (GAUZE/BANDAGES/DRESSINGS) ×1 IMPLANT
GLOVE INDICATOR 8.0 STRL GRN (GLOVE) ×2 IMPLANT
GLOVE SURG ORTHO 8.0 STRL STRW (GLOVE) ×6 IMPLANT
GOWN STRL REUS W/ TWL LRG LVL3 (GOWN DISPOSABLE) ×1 IMPLANT
GOWN STRL REUS W/ TWL XL LVL3 (GOWN DISPOSABLE) ×1 IMPLANT
GOWN STRL REUS W/TWL LRG LVL3 (GOWN DISPOSABLE) ×2
GOWN STRL REUS W/TWL XL LVL3 (GOWN DISPOSABLE) ×2
HOOD PEEL AWAY FLYTE STAYCOOL (MISCELLANEOUS) ×6 IMPLANT
INSERT ARTI HI FLEX 9 SZ 3-4 (Insert) ×1 IMPLANT
IV NS 1000ML (IV SOLUTION) ×2
IV NS 1000ML BAXH (IV SOLUTION) ×1 IMPLANT
KIT ADPT GUIDE LGNP RT F5/T3 (KITS) ×1 IMPLANT
KIT TURNOVER KIT A (KITS) ×2 IMPLANT
MAT ABSORB  FLUID 56X50 GRAY (MISCELLANEOUS)
MAT ABSORB FLUID 56X50 GRAY (MISCELLANEOUS) ×1 IMPLANT
NDL SAFETY ECLIPSE 18X1.5 (NEEDLE) ×1 IMPLANT
NDL SPNL 20GX3.5 QUINCKE YW (NEEDLE) ×1 IMPLANT
NEEDLE HYPO 18GX1.5 SHARP (NEEDLE) ×2
NEEDLE SPNL 20GX3.5 QUINCKE YW (NEEDLE) ×2 IMPLANT
NS IRRIG 1000ML POUR BTL (IV SOLUTION) ×2 IMPLANT
NS VIS ADPT GUIDE LGNP KIT RT SZ F5/T3 ×1 IMPLANT
PACK TOTAL KNEE (MISCELLANEOUS) ×2 IMPLANT
PAD CAST CTTN 4X4 STRL (SOFTGOODS) IMPLANT
PAD DE MAYO PRESSURE PROTECT (MISCELLANEOUS) ×2 IMPLANT
PAD WRAPON POLAR KNEE (MISCELLANEOUS) ×1 IMPLANT
PADDING CAST COTTON 4X4 STRL (SOFTGOODS) ×2
PULSAVAC PLUS IRRIG FAN TIP (DISPOSABLE) ×2
STAPLER SKIN PROX 35W (STAPLE) ×2 IMPLANT
STOCKINETTE BIAS CUT 6 980064 (GAUZE/BANDAGES/DRESSINGS) ×1 IMPLANT
SUCTION FRAZIER HANDLE 10FR (MISCELLANEOUS) ×1
SUCTION TUBE FRAZIER 10FR DISP (MISCELLANEOUS) ×1 IMPLANT
SUT DVC 2 QUILL PDO  T11 36X36 (SUTURE) ×1
SUT DVC 2 QUILL PDO T11 36X36 (SUTURE) ×1 IMPLANT
SUT VIC AB 2-0 CT1 18 (SUTURE) ×2 IMPLANT
SUT VIC AB 2-0 CT1 27 (SUTURE) ×2
SUT VIC AB 2-0 CT1 TAPERPNT 27 (SUTURE) ×1 IMPLANT
SUT VIC AB PLUS 45CM 1-MO-4 (SUTURE) ×2 IMPLANT
SYR 30ML LL (SYRINGE) ×6 IMPLANT
TIBIAL BASEPLATE SZ 3 (Knees) ×2 IMPLANT
TIP FAN IRRIG PULSAVAC PLUS (DISPOSABLE) ×1 IMPLANT
WRAPON POLAR PAD KNEE (MISCELLANEOUS) ×2

## 2017-07-09 NOTE — H&P (Signed)
The patient has been re-examined, and the chart reviewed, and there have been no interval changes to the documented history and physical.  Plan a right total knee replacement today.  Anesthesia is consulted regarding a peripheral nerve block for post-operative pain.  The risks, benefits, and alternatives have been discussed at length, and the patient is willing to proceed.     

## 2017-07-09 NOTE — Anesthesia Procedure Notes (Signed)
Date/Time: 07/09/2017 8:00 AM Performed by: Nelda Marseille, CRNA Pre-anesthesia Checklist: Patient identified, Emergency Drugs available, Suction available, Patient being monitored and Timeout performed Oxygen Delivery Method: Simple face mask

## 2017-07-09 NOTE — Progress Notes (Signed)
Pt arrived to room 140 from PACU. Pt is A&Ox4. Chest tightness has improved per pt, VSS. Polar care and foley in place, surgical dressing CDI. Pt and her significant other oriented to room and educated on pain medication regimen.   Jean Gonzales, Jerry Caras

## 2017-07-09 NOTE — Progress Notes (Signed)
Pt states she is having chest tightness  Center of chest   Vital signs stable no sweating

## 2017-07-09 NOTE — Op Note (Signed)
DATE OF SURGERY:  07/09/2017 TIME: 10:06 AM  PATIENT NAME:  Jean Gonzales   AGE: 62 y.o.    PRE-OPERATIVE DIAGNOSIS:  M17.11 Unilateral primary osteoartthritis, right knee  POST-OPERATIVE DIAGNOSIS:  Same  PROCEDURE:  Procedure(s): TOTAL KNEE ARTHROPLASTY  SURGEON:  Lovell Sheehan, MD   ASSISTANT:  Wyatt Portela, PA-C  OPERATIVE IMPLANTS: Legion Oxinium Cruciate Retaining Femoral component size  5N, Genesis 2 Fixed Bearing Tray size 3, Patella polyethylene 3-peg oval button size 32 mm, with a 9 mm Legion high flex polyethylene insert.   PREOPERATIVE INDICATIONS:  Jean Gonzales is an 62 y.o. female who has a diagnosis of right knee arthritis and elected for a right total knee arthroplasty after failing nonoperative treatment, including activity modification, pain medication, physical therapy and injections who has significant impairment of their activities of daily living.  Radiographs have demonstrated tricompartmental osteoarthritis joint space narrowing, osteophytes, subchondral sclerosis and cyst formation.  The risks, benefits, and alternatives were discussed at length including but not limited to the risks of infection, bleeding, nerve or blood vessel injury, knee stiffness, fracture, dislocation, loosening or failure of the hardware and the need for further surgery. Medical risks include but not limited to DVT and pulmonary embolism, myocardial infarction, stroke, pneumonia, respiratory failure and death. I discussed these risks with the patient in my office prior to the date of surgery. They understood these risks and were willing to proceed.  OPERATIVE FINDINGS AND UNIQUE ASPECTS OF THE CASE:  All three compartments with advanced and severe degenerative changes, large osteophytes and an abundance of synovial fluid. Significant deformity was also noted. A decision was made to proceed with total knee arthroplasty.   OPERATIVE DESCRIPTION:  The patient was brought to the  operative room and placed in a supine position after undergoing placement of a general anesthetic. IV antibiotics were given. Patient received tranexamic acid. The lower extremity was prepped and draped in the usual sterile fashion.  A time out was performed to verify the patient's name, date of birth, medical record number, correct site of surgery and correct procedure to be performed. The timeout was also used to confirm the patient received antibiotics and that appropriate instruments, implants and radiographs studies were available in the room.  The leg was elevated and exsanguinated with an Esmarch and the tourniquet was inflated to 275 mmHg.  A midline incision was made over the left knee.. A medial parapatellar arthrotomy was then made and the patella subluxed laterally and the knee was brought into 90 of flexion. Hoffa's fat pad along with the anterior cruciate ligament was resected and the medial joint line was exposed.  Attention was then turned to preparation of the patella. The thickness of the patella was measured with a caliper, the diameter measured with the patella templates.  The patella resection was then made with an oscillating saw using the patella cutting guide.  The 32 mm button fit appropriately.  3 peg holes for the patella component were then drilled.  The extramedullary tibial cutting guide was then placed using the anterior tibial crest and second ray of the foot as a reference.  The Visionaire tibial cutting guide was adjusted to allow for appropriate posterior slope.  The tibial cutting block was pinned into position. The slotted stylus was used to measure the proximal tibial resection of 2 mm off the low medial side. Care was taken during the tibial resection to protect the medial and collateral ligaments.  The resected tibial bone was removed.  The distal femur was resected using the Visionaire cutting guide.  Care was taken to protect the collateral ligaments during distal  femoral resection.  The distal femoral resection was performed with an oscillating saw. The femoral cutting guide was then removed. Extension gap was measured with a 9 mm spacer block and alignment and extension was confirmed using a long alignment rod. The femur was sized to be a 4. Rotation of the referencing guide was checked with the epicondylar axis and Whitesides line. Then the 4-in-1 cutting jig was then applied to the distal femur. A stylus was used to confirm that the anterior femur would not be notched.   Then the anterior, posterior and chamfer femoral cuts were then made with an oscillating saw.  All posterior osteophytes were removed.  The flexion gap was then measured with a flexion spacer block and long alignment rod and was found to be symmetric with the extension gap and perpendicular to mechanical axis of the tibia.  The proximal tibia plateau was then sized with trial trays. The best coverage was achieved with a size 3. This tibial tray was then pinned into position. The proximal tibia was then prepared with the reamer and keel punch.  After tibial preparation was completed, all trial components were inserted with polyethylene trials.  The knee was found to have excellent balance and full motion with a size 9 mm tibial polyethylene insert..    The trials were then placed. Knee was taken through a full range of motion and deemed to be stable with the trial components. All trial components were then removed.  The joint was copiously irrigated with pulse lavage.  The final total knee arthroplasty components were then cemented into place. The knee was held in extension while cement was allowed to cure.The knee was taken through a range of motion and the patella tracked well and the knee was again irrigated copiously.  The knee capsule was then injected with Exparel.  The medial arthrotomy was closed with #1 Vicryl and #2 Quill. The subcutaneous tissue closed with  2-0 vicryl, and skin  approximated with staples.  A dry sterile and compressive dressing was applied.  A Polar Care was applied to the operative knee.  The patient was awakened and brought to the PACU in stable and satisfactory condition.  All sharp, lap and instrument counts were correct at the conclusion the case. I spoke with the patient's family in the postop consultation room to let them know the case had been performed without complication and the patient was stable in recovery room.   Total tourniquet time was 75 minutes.

## 2017-07-09 NOTE — Anesthesia Preprocedure Evaluation (Signed)
Anesthesia Evaluation  Patient identified by MRN, date of birth, ID band Patient awake    Reviewed: Allergy & Precautions, H&P , NPO status , Patient's Chart, lab work & pertinent test results  History of Anesthesia Complications Negative for: history of anesthetic complications  Airway Mallampati: III  TM Distance: >3 FB Neck ROM: limited    Dental  (+) Chipped, Poor Dentition   Pulmonary neg pulmonary ROS, neg shortness of breath,           Cardiovascular Exercise Tolerance: Good hypertension, (-) angina(-) Past MI and (-) DOE      Neuro/Psych negative neurological ROS  negative psych ROS   GI/Hepatic Neg liver ROS, GERD  Medicated and Controlled,  Endo/Other  diabetes, Type 2  Renal/GU      Musculoskeletal  (+) Arthritis ,   Abdominal   Peds  Hematology negative hematology ROS (+)   Anesthesia Other Findings Past Medical History: No date: Arthritis     Comment:  osteoarthritis right knee 2014: Cancer ([place])     Comment:  endometrial CA No date: Diabetes mellitus without complication (HCC) No date: Hyperlipidemia No date: Hypertension  Past Surgical History: 2015: ABDOMINAL HYSTERECTOMY     Comment:  cancer 2008ish: BREAST BIOPSY; Right     Comment:  core 1992: CHOLECYSTECTOMY 1973: TONSILLECTOMY No date: TONSILLECTOMY  BMI    Body Mass Index:  36.95 kg/m      Reproductive/Obstetrics negative OB ROS                             Anesthesia Physical Anesthesia Plan  ASA: III  Anesthesia Plan: Spinal   Post-op Pain Management:    Induction:   PONV Risk Score and Plan:   Airway Management Planned: Natural Airway and Nasal Cannula  Additional Equipment:   Intra-op Plan:   Post-operative Plan:   Informed Consent: I have reviewed the patients History and Physical, chart, labs and discussed the procedure including the risks, benefits and alternatives for the  proposed anesthesia with the patient or authorized representative who has indicated his/her understanding and acceptance.   Dental Advisory Given  Plan Discussed with: Anesthesiologist, CRNA and Surgeon  Anesthesia Plan Comments: (Patient reports no bleeding problems and no anticoagulant use.  Plan for spinal with backup GA  Patient consented for risks of anesthesia including but not limited to:  - adverse reactions to medications - risk of bleeding, infection, nerve damage and headache - risk of failed spinal - damage to teeth, lips or other oral mucosa - sore throat or hoarseness - Damage to heart, brain, lungs or loss of life  Patient voiced understanding.)        Anesthesia Quick Evaluation

## 2017-07-09 NOTE — Transfer of Care (Signed)
Immediate Anesthesia Transfer of Care Note  Patient: Jean Gonzales  Procedure(s) Performed: TOTAL KNEE ARTHROPLASTY (Right )  Patient Location: PACU  Anesthesia Type:Spinal  Level of Consciousness: awake, alert  and oriented  Airway & Oxygen Therapy: Patient Spontanous Breathing and Patient connected to face mask oxygen  Post-op Assessment: Report given to RN and Post -op Vital signs reviewed and stable  Post vital signs: Reviewed and stable  Last Vitals:  Vitals Value Taken Time  BP    Temp    Pulse    Resp    SpO2      Last Pain:  Vitals:   07/09/17 0620  TempSrc: Oral  PainSc: 7          Complications: No apparent anesthesia complications

## 2017-07-09 NOTE — Anesthesia Procedure Notes (Signed)
Spinal  Patient location during procedure: OR Start time: 07/09/2017 7:30 AM End time: 07/09/2017 7:39 AM Staffing Resident/CRNA: Nelda Marseille, CRNA Performed: resident/CRNA  Preanesthetic Checklist Completed: patient identified, site marked, surgical consent, pre-op evaluation, timeout performed, IV checked, risks and benefits discussed and monitors and equipment checked Spinal Block Patient position: sitting Prep: Betadine Patient monitoring: heart rate, continuous pulse ox, blood pressure and cardiac monitor Approach: midline Location: L4-5 Injection technique: single-shot Needle Needle type: Whitacre and Introducer  Needle gauge: 25 G Needle length: 9 cm Assessment Sensory level: T10 Additional Notes Negative paresthesia. Negative blood return. Positive free-flowing CSF. Expiration date of kit checked and confirmed. Patient tolerated procedure well, without complications.

## 2017-07-09 NOTE — Progress Notes (Signed)
EKG done  Dr piscitello in to see pt  Thinks chest tightness is muscular

## 2017-07-09 NOTE — Progress Notes (Signed)
Chaplain responded to an OR for an AD. Pt wanted to talked with her sister about POA. Chaplain educated Pt with friend at bedside.   07/09/17 1300  Clinical Encounter Type  Visited With Patient and family together  Visit Type Initial  Referral From Nurse  Spiritual Encounters  Spiritual Needs Brochure;Prayer

## 2017-07-09 NOTE — Evaluation (Signed)
Physical Therapy Evaluation Patient Details Name: Jean Gonzales MRN: [id number] DOB: December 02, 1955 Today's Date: 07/09/2017   History of Present Illness  Pt is a 62 yo F diagnosed with osteoarthritis of the R knee and is s/p elective R TKA.  PMH includes: HTN, DM II, GERD, endometrial CA, and HLD.      Clinical Impression  Pt presents with deficits in strength, transfers, mobility, gait, balance, R knee ROM, and activity tolerance.  Pt required extensive assistance and cues with bed mobility tasks and was CGA with sit to/from stand transfers from an elevated surface.  Pt was antalgic during limited amb at the EOB with a step-to gait pattern with heavy BUE support on the RW when advancing her LLE.  Pt's SpO2 on room air and HR were WNL during session with no adverse symptoms noted other than R knee pain which increased from 3/10 at baseline to 9/10 at the end of session with nursing notified. Pt will benefit from PT services in a SNF setting upon discharge to safely address above deficits for decreased caregiver assistance and eventual return to PLOF.       Follow Up Recommendations SNF    Equipment Recommendations  Rolling walker with 5" wheels;Other (comment)(TBD at next venue if pt discharges to SNF)    Recommendations for Other Services       Precautions / Restrictions Precautions Precautions: Knee Restrictions Weight Bearing Restrictions: Yes RLE Weight Bearing: Weight bearing as tolerated      Mobility  Bed Mobility Overal bed mobility: Needs Assistance Bed Mobility: Supine to Sit;Sit to Supine     Supine to sit: Mod assist Sit to supine: Mod assist   General bed mobility comments: Mod A for RLE in and out of bed  Transfers Overall transfer level: Needs assistance Equipment used: Rolling walker (2 wheeled) Transfers: Sit to/from Stand Sit to Stand: Min guard;From elevated surface         General transfer comment: Mod verbal cues for  sequencing  Ambulation/Gait Ambulation/Gait assistance: Min guard Ambulation Distance (Feet): 4 Feet Assistive device: Rolling walker (2 wheeled) Gait Pattern/deviations: Step-to pattern;Decreased step length - left;Decreased stance time - right;Antalgic     General Gait Details: Mod verbal cues for sequencing  Stairs Stairs: (Deferred)          Wheelchair Mobility    Modified Rankin (Stroke Patients Only)       Balance Overall balance assessment: Needs assistance   Sitting balance-Leahy Scale: Good     Standing balance support: Bilateral upper extremity supported Standing balance-Leahy Scale: Good                               Pertinent Vitals/Pain Pain Assessment: 0-10 Pain Score: 3  Pain Location: R knee Pain Descriptors / Indicators: Aching Pain Intervention(s): Premedicated before session;Monitored during session;Limited activity within patient's tolerance;Patient requesting pain meds-RN notified    Home Living Family/patient expects to be discharged to:: Private residence Living Arrangements: Spouse/significant other Available Help at Discharge: Family;Available PRN/intermittently Type of Home: House Home Access: Stairs to enter Entrance Stairs-Rails: Right;Left(Can not reach both) Entrance Stairs-Number of Steps: 7 Home Layout: One level Home Equipment: Cane - single point      Prior Function Level of Independence: Independent with assistive device(s)         Comments: Mod Ind amb with 3-pronged cane limited community distances     Hand Dominance  Extremity/Trunk Assessment   Upper Extremity Assessment Upper Extremity Assessment: Overall WFL for tasks assessed    Lower Extremity Assessment Lower Extremity Assessment: Generalized weakness;RLE deficits/detail RLE Deficits / Details: R hip flex strength 2+/5, L hip flex strength 2+ to 3/5 with much effort with pt stating could never do SLRs secondary to back pain; good  B ankle PF and DF strength against manual resistance RLE: Unable to fully assess due to pain RLE Sensation: WNL       Communication   Communication: No difficulties  Cognition Arousal/Alertness: Awake/alert Behavior During Therapy: WFL for tasks assessed/performed Overall Cognitive Status: Within Functional Limits for tasks assessed                                        General Comments      Exercises Total Joint Exercises Ankle Circles/Pumps: AROM;Both;10 reps;Strengthening;5 reps Quad Sets: Strengthening;Right;5 reps;10 reps Gluteal Sets: Strengthening;Both;10 reps Hip ABduction/ADduction: AAROM;Right;5 reps Straight Leg Raises: Right;10 reps;AAROM Long Arc Quad: AROM;Right;5 reps;10 reps Knee Flexion: AROM;Right;5 reps;10 reps Goniometric ROM: R knee AROM: 15-73 deg Marching in Standing: AROM;Both;5 reps Other Exercises Other Exercises: TKA exercise handout education/review provided    Assessment/Plan    PT Assessment Patient needs continued PT services  PT Problem List Decreased strength;Decreased range of motion;Decreased activity tolerance;Decreased balance;Decreased mobility;Decreased knowledge of use of DME       PT Treatment Interventions DME instruction;Gait training;Stair training;Functional mobility training;Balance training;Therapeutic exercise;Therapeutic activities;Patient/family education    PT Goals (Current goals can be found in the Care Plan section)  Acute Rehab PT Goals Patient Stated Goal: To walk without pain PT Goal Formulation: With patient Time For Goal Achievement: 07/22/17 Potential to Achieve Goals: Good    Frequency BID   Barriers to discharge Inaccessible home environment;Decreased caregiver support      Co-evaluation               AM-PAC PT "6 Clicks" Daily Activity  Outcome Measure Difficulty turning over in bed (including adjusting bedclothes, sheets and blankets)?: Unable Difficulty moving from lying  on back to sitting on the side of the bed? : Unable Difficulty sitting down on and standing up from a chair with arms (e.g., wheelchair, bedside commode, etc,.)?: Unable Help needed moving to and from a bed to chair (including a wheelchair)?: A Little Help needed walking in hospital room?: A Lot Help needed climbing 3-5 steps with a railing? : A Lot 6 Click Score: 10    End of Session Equipment Utilized During Treatment: Gait belt Activity Tolerance: Patient limited by pain Patient left: in bed;with bed alarm set;with call bell/phone within reach;with family/visitor present;with SCD's reapplied(Full length SCD to LLE and polar care to RLE as pt was found) Nurse Communication: Mobility status PT Visit Diagnosis: Muscle weakness (generalized) (M62.81);Other abnormalities of gait and mobility (R26.89);Pain Pain - Right/Left: Right Pain - part of body: Knee    Time: 2458-0998 PT Time Calculation (min) (ACUTE ONLY): 48 min   Charges:   PT Evaluation $PT Eval Low Complexity: 1 Low PT Treatments $Therapeutic Exercise: 8-22 mins   PT G Codes:        DRoyetta Asal PT, DPT 07/09/17, 4:52 PM

## 2017-07-09 NOTE — Progress Notes (Signed)
Spinal remains at L1 after an hour and a half very very slight movement to hips but no thigh reflexion    Called dr piscitello called  States pt is fine to go to floor

## 2017-07-09 NOTE — Anesthesia Post-op Follow-up Note (Signed)
Anesthesia QCDR form completed.        

## 2017-07-10 ENCOUNTER — Encounter: Payer: Self-pay | Admitting: Orthopedic Surgery

## 2017-07-10 LAB — BASIC METABOLIC PANEL
Anion gap: 4 — ABNORMAL LOW (ref 5–15)
BUN: 11 mg/dL (ref 6–20)
CALCIUM: 8.5 mg/dL — AB (ref 8.9–10.3)
CO2: 25 mmol/L (ref 22–32)
CREATININE: 0.88 mg/dL (ref 0.44–1.00)
Chloride: 107 mmol/L (ref 101–111)
Glucose, Bld: 132 mg/dL — ABNORMAL HIGH (ref 65–99)
Potassium: 3.9 mmol/L (ref 3.5–5.1)
SODIUM: 136 mmol/L (ref 135–145)

## 2017-07-10 LAB — CBC
HCT: 34.6 % — ABNORMAL LOW (ref 35.0–47.0)
Hemoglobin: 11.9 g/dL — ABNORMAL LOW (ref 12.0–16.0)
MCH: 30.4 pg (ref 26.0–34.0)
MCHC: 34.4 g/dL (ref 32.0–36.0)
MCV: 88.2 fL (ref 80.0–100.0)
PLATELETS: 191 10*3/uL (ref 150–440)
RBC: 3.92 MIL/uL (ref 3.80–5.20)
RDW: 13.5 % (ref 11.5–14.5)
WBC: 10 10*3/uL (ref 3.6–11.0)

## 2017-07-10 NOTE — Progress Notes (Signed)
Pt refused for foley catheter to be taken out this time. Per pt, she "does not feel comfortable going to the bathroom" yet. This Probation officer educated pt about the usual protocol of discontinuing foley on first post op day, serving the bedpan if needed and physical therapy sessions. Despite education, pt still refused to have the foley taken out. Will pass on to the incoming shift RN.

## 2017-07-10 NOTE — Progress Notes (Signed)
Physical Therapy Treatment Patient Details Name: Jean Gonzales MRN: [id number] DOB: 05-Sep-1955 Today's Date: 07/10/2017    History of Present Illness Pt is a 62 yo F diagnosed with osteoarthritis of the R knee and is s/p elective R TKA.  PMH includes: HTN, DM II, GERD, endometrial CA, and HLD.      PT Comments    Pt presents with deficits in strength, transfers, mobility, gait, balance, R knee ROM, and activity tolerance.  Pt continues to require extensive assistance with bed mobility tasks to manage her RLE in and out of bed.  Pt requires significant time and effort to stand from an elevated EOB with verbal cues for proper sequencing.  Pt able to take several small steps at the EOB this session but was very antalgic on the RLE.  Pt had a lot of difficulty advancing both LEs but most notably her LLE where she either shuffled her foot forward or took 2-3 small hops to advance it while leaning heavily on the RW.  Pt would be extremely unsafe to discharge home at her current functional level and would benefit from PT services in a SNF setting upon discharge to safely address above deficits for decreased caregiver assistance and eventual return to PLOF.       Follow Up Recommendations  SNF     Equipment Recommendations  Other (comment);Rolling walker with 5" wheels(TBD at the next venue of care if discharges to a SNF)    Recommendations for Other Services       Precautions / Restrictions Precautions Precautions: Knee Restrictions Weight Bearing Restrictions: Yes RLE Weight Bearing: Weight bearing as tolerated    Mobility  Bed Mobility Overal bed mobility: Needs Assistance Bed Mobility: Supine to Sit;Sit to Supine     Supine to sit: Mod assist Sit to supine: Mod assist   General bed mobility comments: Mod A for RLE in and out of bed  Transfers Overall transfer level: Needs assistance Equipment used: Rolling walker (2 wheeled) Transfers: Sit to/from Stand Sit to Stand:  Min guard;From elevated surface         General transfer comment: Min verbal cues for sequencing  Ambulation/Gait Ambulation/Gait assistance: Min guard Ambulation Distance (Feet): 3 Feet Assistive device: Rolling walker (2 wheeled) Gait Pattern/deviations: Step-to pattern;Decreased step length - left;Decreased stance time - right;Antalgic;Trunk flexed     General Gait Details: Pt highly antalgic on the RLE with L leg advancing by shuffling or quick, small hops with heavy leaning on the RW   Stairs             Wheelchair Mobility    Modified Rankin (Stroke Patients Only)       Balance Overall balance assessment: Needs assistance   Sitting balance-Leahy Scale: Good     Standing balance support: Bilateral upper extremity supported Standing balance-Leahy Scale: Good                              Cognition Arousal/Alertness: Awake/alert Behavior During Therapy: WFL for tasks assessed/performed Overall Cognitive Status: Within Functional Limits for tasks assessed                                        Exercises Total Joint Exercises Ankle Circles/Pumps: AROM;10 reps;Strengthening;5 reps;Right Quad Sets: Strengthening;Right;5 reps;10 reps Gluteal Sets: Strengthening;Both;10 reps Heel Slides: AAROM;Right;10 reps Hip ABduction/ADduction: AAROM;Right;10 reps Straight  Leg Raises: Right;10 reps;AAROM Long Arc Quad: AROM;Right;10 reps;15 reps Knee Flexion: AROM;Right;10 reps;15 reps Goniometric ROM: R knee AROM: 18-65, limited by pain Marching in Standing: AROM;5 reps;Right;Standing Other Exercises Other Exercises: TKA exercise handout review provided Other Exercises: Positioning education provided to encourage R knee ext PROM     General Comments        Pertinent Vitals/Pain Pain Assessment: 0-10 Pain Score: 6  Pain Location: R knee Pain Descriptors / Indicators: Aching;Sore;Operative site guarding Pain Intervention(s):  Premedicated before session;Monitored during session;Limited activity within patient's tolerance    Home Living                      Prior Function            PT Goals (current goals can now be found in the care plan section) Progress towards PT goals: Progressing toward goals    Frequency    BID      PT Plan Current plan remains appropriate    Co-evaluation              AM-PAC PT "6 Clicks" Daily Activity  Outcome Measure                   End of Session Equipment Utilized During Treatment: Gait belt Activity Tolerance: Patient limited by pain Patient left: in bed;with bed alarm set;with call bell/phone within reach;with family/visitor present;with SCD's reapplied;Other (comment)(Polar care to RLE) Nurse Communication: Mobility status PT Visit Diagnosis: Muscle weakness (generalized) (M62.81);Other abnormalities of gait and mobility (R26.89);Pain Pain - Right/Left: Right Pain - part of body: Knee     Time: 2035-5974 PT Time Calculation (min) (ACUTE ONLY): 42 min  Charges:  $Gait Training: 8-22 mins $Therapeutic Exercise: 8-22 mins $Therapeutic Activity: 8-22 mins                    G Codes:       DRoyetta Asal PT, DPT 07/10/17, 4:21 PM

## 2017-07-10 NOTE — Progress Notes (Signed)
Physical Therapy Treatment Patient Details Name: Jean Gonzales MRN: [id number] DOB: Dec 15, 1955 Today's Date: 07/10/2017    History of Present Illness Pt is a 62 yo F diagnosed with osteoarthritis of the R knee and is s/p elective R TKA.  PMH includes: HTN, DM II, GERD, endometrial CA, and HLD.      PT Comments    Pt presents with deficits in strength, transfers, mobility, gait, balance, R knee ROM, and activity tolerance.  Pt remains highly guarded with the RLE and is self-limiting during functional mobility tasks secondary to high R knee pain.  Pt moaning and anxious sitting at the EOB with the R knee in a flexed position and was only able to perform very gentle, low amplitude LAQs.  Pt's R knee flex and ext AROM both regressing compared to previous session.  Pt was able to stand at the EOB and to lift the R foot minimally from the floor but was unable to advance the LLE secondary to pain in the RLE with increased weight bearing.  Pt will benefit from PT services in a SNF setting upon discharge to safely address above deficits for decreased caregiver assistance and eventual return to PLOF.     Follow Up Recommendations  SNF     Equipment Recommendations  Other (comment);Rolling walker with 5" wheels(TBD at next venue of care if pt discharges to a SNF)    Recommendations for Other Services       Precautions / Restrictions Precautions Precautions: Knee Restrictions Weight Bearing Restrictions: Yes RLE Weight Bearing: Weight bearing as tolerated    Mobility  Bed Mobility Overal bed mobility: Needs Assistance Bed Mobility: Supine to Sit;Sit to Supine     Supine to sit: Mod assist Sit to supine: Mod assist   General bed mobility comments: Mod A for RLE in and out of bed  Transfers Overall transfer level: Needs assistance Equipment used: Rolling walker (2 wheeled) Transfers: Sit to/from Stand Sit to Stand: Min guard;From elevated surface         General transfer  comment: Mod verbal cues for sequencing  Ambulation/Gait             General Gait Details: Pt unable to amb this session secondary to R knee pain   Stairs             Wheelchair Mobility    Modified Rankin (Stroke Patients Only)       Balance Overall balance assessment: Needs assistance   Sitting balance-Leahy Scale: Good     Standing balance support: Bilateral upper extremity supported Standing balance-Leahy Scale: Good                              Cognition Arousal/Alertness: Awake/alert Behavior During Therapy: WFL for tasks assessed/performed Overall Cognitive Status: Within Functional Limits for tasks assessed                                        Exercises Total Joint Exercises Ankle Circles/Pumps: AROM;Both;10 reps;Strengthening;5 reps Quad Sets: Strengthening;Right;5 reps;10 reps Gluteal Sets: Strengthening;Both;10 reps Heel Slides: AAROM;Right;10 reps Hip ABduction/ADduction: AAROM;Right;10 reps Straight Leg Raises: Right;10 reps;AAROM Long Arc Quad: AROM;Right;10 reps;15 reps Knee Flexion: AROM;Right;10 reps;15 reps Goniometric ROM: R knee AROM: 18-65, limited by pain Marching in Standing: AROM;5 reps;Right;Standing Other Exercises Other Exercises: TKA exercise handout review provided Other Exercises: Positioning education  provided to encourage R knee ext PROM     General Comments        Pertinent Vitals/Pain Pain Assessment: 0-10 Pain Score: 7  Pain Location: R knee Pain Descriptors / Indicators: Aching;Sore;Operative site guarding Pain Intervention(s): Premedicated before session;Monitored during session;Limited activity within patient's tolerance;Patient requesting pain meds-RN notified    Home Living                      Prior Function            PT Goals (current goals can now be found in the care plan section) Progress towards PT goals: Not progressing toward goals - comment(Limited by  R knee pain)    Frequency    BID      PT Plan Current plan remains appropriate    Co-evaluation              AM-PAC PT "6 Clicks" Daily Activity  Outcome Measure                   End of Session Equipment Utilized During Treatment: Gait belt Activity Tolerance: Patient limited by pain Patient left: in bed;with bed alarm set;with call bell/phone within reach;with family/visitor present;with SCD's reapplied;Other (comment)(Polar care to R knee) Nurse Communication: Mobility status PT Visit Diagnosis: Muscle weakness (generalized) (M62.81);Other abnormalities of gait and mobility (R26.89);Pain Pain - Right/Left: Right Pain - part of body: Knee     Time: 1735-6701 PT Time Calculation (min) (ACUTE ONLY): 43 min  Charges:  $Therapeutic Exercise: 23-37 mins $Therapeutic Activity: 8-22 mins                    G Codes:       DRoyetta Asal PT, DPT 07/10/17, 1:36 PM

## 2017-07-10 NOTE — Anesthesia Postprocedure Evaluation (Signed)
Anesthesia Post Note  Patient: Jean Gonzales  Procedure(s) Performed: TOTAL KNEE ARTHROPLASTY (Right )  Patient location during evaluation: Nursing Unit Anesthesia Type: Spinal Level of consciousness: awake and alert and oriented Pain management: satisfactory to patient Vital Signs Assessment: post-procedure vital signs reviewed and stable Respiratory status: respiratory function stable Cardiovascular status: stable Postop Assessment: spinal receding, no backache, no apparent nausea or vomiting, no headache, patient able to bend at knees and adequate PO intake Anesthetic complications: no     Last Vitals:  Vitals:   07/10/17 0000 07/10/17 0724  BP: (!) 144/82 131/78  Pulse: 66 64  Resp: 18   Temp: 36.7 C 36.8 C  SpO2: 100% 100%    Last Pain:  Vitals:   07/10/17 0724  TempSrc: Oral  PainSc:                  Blima Singer

## 2017-07-10 NOTE — Clinical Social Work Note (Signed)
CSW received referral for SNF, patient has been faxed out awaiting bed offers.  Jones Broom. Dazey, MSW, [place]  07/10/2017 12:29 PM

## 2017-07-10 NOTE — Clinical Social Work Note (Signed)
CSW spoke with patient and she has agreed to go to SNF for short term rehab.  CSW presented bed offers and patient chose [facility], CSW contacted [facility], and they have started insurance authorization.  Jones Broom. Frannie, MSW, [place]  07/10/2017 5:15 PM

## 2017-07-10 NOTE — NC FL2 (Addendum)
[place] LEVEL OF CARE SCREENING TOOL     IDENTIFICATION  Patient Name: Jean Gonzales Birthdate: 1956/03/19 Sex: female Admission Date (Current Location): 07/09/2017  [place] and [place] Number:  Engineering geologist and Address:  [facility], [address] [place], [place] [address]      Provider Number: [id number]  Attending Physician Name and Address:  Lovell Sheehan, MD  Relative Name and Phone Number:     [name], [name] [phone]       Current Level of Care: Hospital Recommended Level of Care: [place] Prior Approval Number:    Date Approved/Denied:   PASRR Number: [id number]  Discharge Plan: SNF    Current Diagnoses: Patient Active Problem List   Diagnosis Date Noted  . Osteoarthritis of right knee 07/09/2017  . Osteoarthritis 05/07/2017  . Synovitis of knee 12/29/2016  . Knee joint effusion 12/29/2016  . Malignant neoplasm of endometrium ([place]) 02/22/2015  . Acid reflux 02/22/2015  . History of migraine headaches 02/22/2015  . HLD (hyperlipidemia) 02/22/2015  . BP (high blood pressure) 02/22/2015  . History of colon polyps 02/22/2015  . Urinary incontinence 02/22/2015  . Psoriatic arthritis ([place]) 02/22/2015  . Diabetes mellitus ([place]) 10/06/2014  . Urethral stenosis 12/03/2013  . Rheumatoid arthritis ([place]) 03/14/2013    Orientation RESPIRATION BLADDER Height & Weight     Self, Time, Situation, Place  Normal Foley catheter/Indwelling catheter Weight: 243 lb (110.2 kg) Height:  5\' 8"  (172.7 cm)  BEHAVIORAL SYMPTOMS/MOOD NEUROLOGICAL BOWEL NUTRITION STATUS      Continent Diet(Regular diet)  AMBULATORY STATUS COMMUNICATION OF NEEDS Skin   Limited Assist Verbally Surgical wounds                       Personal Care Assistance Level of Assistance  Feeding, Dressing, Bathing Bathing Assistance: Limited assistance Feeding assistance: Independent Dressing Assistance: Limited  assistance     Functional Limitations Info  Sight, Hearing, Speech Sight Info: Adequate Hearing Info: Adequate Speech Info: Adequate    SPECIAL CARE FACTORS FREQUENCY  PT (By licensed PT), OT (By licensed OT)     PT Frequency: 5x a week OT Frequency: 5x a week            Contractures Contractures Info: Not present    Additional Factors Info  Allergies, Code Status Code Status Info: Full Code Allergies Info: IODINATED DIAGNOSTIC AGENTS, IODINE            Current Medications (07/10/2017):  This is the current hospital active medication list Current Facility-Administered Medications  Medication Dose Route Frequency Provider Last Rate Last Dose  . acetaminophen (TYLENOL) tablet 325-650 mg  325-650 mg Oral Q6H PRN Lovell Sheehan, MD      . aspirin EC tablet 325 mg  325 mg Oral Q breakfast Lovell Sheehan, MD   325 mg at 07/10/17 0739  . atorvastatin (LIPITOR) tablet 40 mg  40 mg Oral q1800 Lovell Sheehan, MD   40 mg at 07/09/17 1827  . bisacodyl (DULCOLAX) suppository 10 mg  10 mg Rectal Daily PRN Lovell Sheehan, MD      . docusate sodium (COLACE) capsule 100 mg  100 mg Oral BID Lovell Sheehan, MD   100 mg at 07/10/17 1108  . gabapentin (NEURONTIN) capsule 300 mg  300 mg Oral TID Lovell Sheehan, MD   300 mg at 07/10/17 1108  . HYDROcodone-acetaminophen (NORCO) 7.5-325 MG per tablet 1-2 tablet  1-2 tablet Oral Q4H PRN Lovell Sheehan, MD   2 tablet at 07/10/17 0431  . HYDROcodone-acetaminophen (NORCO/VICODIN) 5-325 MG per tablet 1-2 tablet  1-2 tablet Oral Q4H PRN Lovell Sheehan, MD   1 tablet at 07/10/17 1106  . lactated ringers infusion   Intravenous Continuous Lovell Sheehan, MD   Stopped at 07/10/17 [phone]  . magnesium citrate solution 1 Bottle  1 Bottle Oral Once PRN Lovell Sheehan, MD      . menthol-cetylpyridinium (CEPACOL) lozenge 3 mg  1 lozenge Oral PRN Lovell Sheehan, MD       Or  . phenol (CHLORASEPTIC) mouth spray 1 spray  1 spray Mouth/Throat PRN Lovell Sheehan, MD      . metoCLOPramide (REGLAN) tablet 5-10 mg  5-10 mg Oral Q8H PRN Lovell Sheehan, MD       Or  . metoCLOPramide (REGLAN) injection 5-10 mg  5-10 mg Intravenous Q8H PRN Lovell Sheehan, MD      . morphine 2 MG/ML injection 0.5-1 mg  0.5-1 mg Intravenous Q2H PRN Lovell Sheehan, MD      . ondansetron [facility]) tablet 4 mg  4 mg Oral Q6H PRN Lovell Sheehan, MD       Or  . ondansetron [facility]) injection 4 mg  4 mg Intravenous Q6H PRN Lovell Sheehan, MD   4 mg at 07/10/17 0743  . pantoprazole (PROTONIX) EC tablet 40 mg  40 mg Oral Daily Lovell Sheehan, MD   40 mg at 07/10/17 1108  . traMADol (ULTRAM) tablet 50 mg  50 mg Oral Q6H Lovell Sheehan, MD   50 mg at 07/09/17 2333  . zolpidem (AMBIEN) tablet 10 mg  10 mg Oral QHS PRN Lovell Sheehan, MD         Discharge Medications: Please see discharge summary for a list of discharge medications.  Relevant Imaging Results:  Relevant Lab Results:   Additional Information SSN [id number]  Ross Ludwig, [place]

## 2017-07-10 NOTE — Progress Notes (Signed)
  Subjective:  Patient reports pain as moderate.    Objective:   VITALS:   Vitals:   07/09/17 1630 07/09/17 1656 07/10/17 0000 07/10/17 0724  BP:  (!) 147/85 (!) 144/82 131/78  Pulse: 73 63 66 64  Resp:   18   Temp:  97.9 F (36.6 C) 98 F (36.7 C) 98.3 F (36.8 C)  TempSrc:   Oral Oral  SpO2: 98% 100% 100% 100%  Weight:      Height:        PHYSICAL EXAM:  Sensation intact distally Dorsiflexion/Plantar flexion intact Incision: dressing C/D/I Compartment soft  LABS  Results for orders placed or performed during the hospital encounter of 07/09/17 (from the past 24 hour(s))  Glucose, capillary     Status: None   Collection Time: 07/09/17 10:02 AM  Result Value Ref Range   Glucose-Capillary 96 65 - 99 mg/dL  CBC     Status: Abnormal   Collection Time: 07/10/17  5:18 AM  Result Value Ref Range   WBC 10.0 3.6 - 11.0 K/uL   RBC 3.92 3.80 - 5.20 MIL/uL   Hemoglobin 11.9 (L) 12.0 - 16.0 g/dL   HCT 34.6 (L) 35.0 - 47.0 %   MCV 88.2 80.0 - 100.0 fL   MCH 30.4 26.0 - 34.0 pg   MCHC 34.4 32.0 - 36.0 g/dL   RDW 13.5 11.5 - 14.5 %   Platelets 191 150 - 440 K/uL  Basic metabolic panel     Status: Abnormal   Collection Time: 07/10/17  5:18 AM  Result Value Ref Range   Sodium 136 135 - 145 mmol/L   Potassium 3.9 3.5 - 5.1 mmol/L   Chloride 107 101 - 111 mmol/L   CO2 25 22 - 32 mmol/L   Glucose, Bld 132 (H) 65 - 99 mg/dL   BUN 11 6 - 20 mg/dL   Creatinine, Ser 0.88 0.44 - 1.00 mg/dL   Calcium 8.5 (L) 8.9 - 10.3 mg/dL   GFR calc non Af Amer >60 >60 mL/min   GFR calc Af Amer >60 >60 mL/min   Anion gap 4 (L) 5 - 15    Dg Knee Right Port  Result Date: 07/09/2017 CLINICAL DATA:  62 year old female status post right total knee replacement. EXAM: PORTABLE RIGHT KNEE - 1-2 VIEW COMPARISON:  Right knee series 07/24/2016. FINDINGS: Portable AP and cross-table lateral views of the right knee. Total knee arthroplasty hardware in place, appears intact, and normally aligned.  Postoperative air in fluid in the joint. Anterior skin staples. No unexpected osseous changes. IMPRESSION: Right total knee arthroplasty with no adverse features. Electronically Signed   By: Genevie Ann M.D.   On: 07/09/2017 10:30    Assessment/Plan: 1 Day Post-Op   Active Problems:   Osteoarthritis of right knee   Advance diet Up with therapy D/C IV fluids Plan for discharge tomorrow with home health PT Change dressing tomorrow Keep Foley until tomorrow morning   Lovell Sheehan , MD 07/10/2017, 7:40 AM

## 2017-07-11 LAB — CBC
HCT: 34.9 % — ABNORMAL LOW (ref 35.0–47.0)
Hemoglobin: 11.7 g/dL — ABNORMAL LOW (ref 12.0–16.0)
MCH: 29.9 pg (ref 26.0–34.0)
MCHC: 33.6 g/dL (ref 32.0–36.0)
MCV: 89 fL (ref 80.0–100.0)
PLATELETS: 195 10*3/uL (ref 150–440)
RBC: 3.92 MIL/uL (ref 3.80–5.20)
RDW: 13.4 % (ref 11.5–14.5)
WBC: 9.9 10*3/uL (ref 3.6–11.0)

## 2017-07-11 NOTE — Clinical Social Work Note (Addendum)
Clinical Social Work Assessment  Patient Details  Name: Jean Gonzales MRN: [id number] Date of Birth: 1955-05-22  Date of referral:  07/10/17               Reason for consult:  Facility Placement                Permission sought to share information with:  Facility Sport and exercise psychologist, Family Supports Permission granted to share information::  Yes, Verbal Permission Granted  Name::     Jean, Gonzales [phone]   Agency::  SNF admissions  Relationship::     Contact Information:     Housing/Transportation Living arrangements for the past 2 months:  [place] of Information:  Patient Patient Interpreter Needed:  None Criminal Activity/Legal Involvement Pertinent to Current Situation/Hospitalization:  No - Comment as needed Significant Relationships:  Spouse, Siblings Lives with:  Spouse Do you feel safe going back to the place where you live?  No Need for family participation in patient care:  No (Coment)  Care giving concerns: Patient feels like she needs to go to SNF for short term rehab before going home.    Social Worker assessment / plan:  Patient is a 62 year old female who is alert and oriented x4.  Patient is married and lives with her husband, patient states she has not been to rehab before.  CSW explained to patient how insurance will pay for stay at SNF and what to expect.  Patient at first was refusing to go to SNF but then she had another PT session and realized she does need to go to SNF first before she returns back home.  CSW explained role of social worker and what to expect day of discharge from hospital.  Patient did not express any other questions or concerns.  CSW was given permission to begin bed search in [place].    Employment status:  Disabled (Comment on whether or not currently receiving Disability) Insurance information:  Managed Medicare PT Recommendations:  [place] / Referral to community  resources:  [place]  Patient/Family's Response to care:  Patient and family are in agreement of going to SNF for short term rehab.  Patient/Family's Understanding of and Emotional Response to Diagnosis, Current Treatment, and Prognosis:  Patient was disappointed that she has to go to SNF, but she is positive and expressed she will work hard to get strong enough to return back home with home health.  Patient expressed that she is hopeful she will not have to be at [facility] for very long.  Emotional Assessment Appearance:  Appears stated age Attitude/Demeanor/Rapport:    Affect (typically observed):  Appropriate, Calm Orientation:  Oriented to Place, Oriented to Self, Oriented to  Time, Oriented to Situation Alcohol / Substance use:  Not Applicable Psych involvement (Current and /or in the community):  No (Comment)  Discharge Needs  Concerns to be addressed:  Lack of Support Readmission within the last 30 days:  No Current discharge risk:  Lack of support system Barriers to Discharge:  Ship broker, Continued Medical Work up   [facility], [place] 07/11/2017, 10:16 AM

## 2017-07-11 NOTE — Progress Notes (Addendum)
Physical Therapy Treatment Patient Details Name: Jean Gonzales MRN: [id number] DOB: 05-Aug-1955 Today's Date: 07/11/2017    History of Present Illness Pt is a 62 yo F diagnosed with osteoarthritis of the R knee and is s/p elective R TKA.  PMH includes: HTN, DM II, GERD, endometrial CA, and HLD.      PT Comments    Pt demonstrated bed mobility, transfers, and ambulation (8 ft) (min assist); limited by pain and requiring a considerable amount of time for any mobility (see mobility section for details). Pt reported R knee pain 2/10 (at rest) and 8/10 (with movement).  However, Pt reported she had been refusing pain meds b/c she thought she could "handle it". Pt educated on the importance of staying on schedule w/ pain meds in order to progress with mobility, ROM, and ambulation. Pt then requested meds and RN provided medication during session.  Pt highly guarded with bed exercises, ROM, and ambulation; Pt educated in breathing techniques to help foster relaxation and improve activity tolerance. Pt R knee AROM (11-84) degrees. Pt was agreeable to session and stated she was comfortable at end of session.  PT will focus on progression of transfers and ambulation at next session.   Follow Up Recommendations  SNF     Equipment Recommendations  Other (comment);Rolling walker with 5" wheels;3in1 (PT)    Recommendations for Other Services       Precautions / Restrictions Precautions Precautions: Knee;Fall Precaution Booklet Issued: Yes (comment) Restrictions Weight Bearing Restrictions: Yes RLE Weight Bearing: Weight bearing as tolerated Other Position/Activity Restrictions: R TKR    Mobility  Bed Mobility Overal bed mobility: Needs Assistance Bed Mobility: Supine to Sit     Supine to sit: Min assist;HOB elevated     General bed mobility comments: Pt required quite a bit of extra time and effort to move supine to sit; assist to help move RLE across bed, lower RLE to floor, bring  hips forward on pad, a hand for Pt to pull to bring trunk forward; vc's for sequencing  Transfers Overall transfer level: Needs assistance Equipment used: Rolling walker (2 wheeled) Transfers: Sit to/from Stand Sit to Stand: Min assist;From elevated surface         General transfer comment: assist for lift; vc's to push L hand off of bed, right hand on walker, bring L heel back, bring hands to walker hand grips upon standing, bring hips forward  Ambulation/Gait Ambulation/Gait assistance: Min assist Ambulation Distance (Feet): 8 Feet Assistive device: Rolling walker (2 wheeled) Gait Pattern/deviations: Step-to pattern;Decreased step length - left;Decreased stance time - right;Antalgic;Trunk flexed;Decreased step length - right Gait velocity: decreased   General Gait Details: Assist for stability; vc's for stepping pattern, push through BUE on RW when bring L foot forward/back to offweight RLE   Stairs             Wheelchair Mobility    Modified Rankin (Stroke Patients Only)       Balance Overall balance assessment: Needs assistance Sitting-balance support: Single extremity supported;Feet supported   Sitting balance - Comments: required holding onto bed or rail with one hand when sitting EOB   Standing balance support: Bilateral upper extremity supported Standing balance-Leahy Scale: Poor Standing balance comment: required RW CGA to min assist for stability                            Cognition Arousal/Alertness: Awake/alert Behavior During Therapy: WFL for tasks assessed/performed Overall  Cognitive Status: Within Functional Limits for tasks assessed                                 General Comments: Pt A&O x 4      Exercises Total Joint Exercises Ankle Circles/Pumps: AROM;Strengthening;Both;10 reps;Supine(HOB elevated) Quad Sets: AROM;Strengthening;Right;10 reps;Supine(HOB elevated) Heel Slides: AAROM;Right;Strengthening;5  reps;Supine(HOB elevated) Hip ABduction/ADduction: AAROM;Strengthening;Right;5 reps;Supine(HOB elevated) Straight Leg Raises: AAROM;Strengthening;Right;5 reps;Supine(HOB elevated) Goniometric ROM: R knee AROM: 11-84 degrees    General Comments        Pertinent Vitals/Pain Pain Assessment: 0-10 Pain Score: 8  Pain Location: R knee(with movement) Pain Descriptors / Indicators: Aching;Sore;Operative site guarding Pain Intervention(s): Limited activity within patient's tolerance;RN gave pain meds during session;Monitored during session    Home Living                      Prior Function            PT Goals (current goals can now be found in the care plan section) Acute Rehab PT Goals Patient Stated Goal: To walk without pain PT Goal Formulation: With patient Time For Goal Achievement: 07/22/17 Potential to Achieve Goals: Good Progress towards PT goals: Progressing toward goals    Frequency    BID      PT Plan Current plan remains appropriate    Co-evaluation              AM-PAC PT "6 Clicks" Daily Activity  Outcome Measure  Difficulty turning over in bed (including adjusting bedclothes, sheets and blankets)?: A Lot Difficulty moving from lying on back to sitting on the side of the bed? : Unable Difficulty sitting down on and standing up from a chair with arms (e.g., wheelchair, bedside commode, etc,.)?: Unable Help needed moving to and from a bed to chair (including a wheelchair)?: A Little Help needed walking in hospital room?: A Lot Help needed climbing 3-5 steps with a railing? : A Lot 6 Click Score: 11    End of Session Equipment Utilized During Treatment: Gait belt Activity Tolerance: Patient limited by pain Patient left: in chair;with call bell/phone within reach;with SCD's reapplied;with family/visitor present(polar care applied and activated. B heels elevated by towel rolls. chair pad not in chair; RN notified; Pt educated to notify nursing if  needing to get up) Nurse Communication: Mobility status;Patient requests pain meds(Pain 8/10 w/ movement) PT Visit Diagnosis: Muscle weakness (generalized) (M62.81);Other abnormalities of gait and mobility (R26.89);Pain;Difficulty in walking, not elsewhere classified (R26.2) Pain - Right/Left: Right Pain - part of body: Knee     Time: 2706-2376 PT Time Calculation (min) (ACUTE ONLY): 55 min  Charges:                       G Codes:        Jean Gonzales, SPT 07/11/2017, 11:34 AM

## 2017-07-11 NOTE — Progress Notes (Signed)
S: 62 year old F POD2 right total knee arthroplasty with Dr. Harlow Mares.  Overall feeling well currently. Sitting in chair. Had PT this morning, difficulty with transfers and walking. Pain is controlled with medication. Foley still in place. History of urethra narrowing and urinary retention. Social work has recommended SNF  O:  A and Ox3 NAD Wound dressing CDI no signs of bleeding. Neurovascular intact in right lower extremity.  Intact pedal pulses and distal sensation to the foot. Ankle plantarflexion and dorsiflexion are intact.   A:63 year old F POD2 right total knee arthroplasty  P:  Foley out today. Bedside commode as needed and assistance with transfers Continue to advance PT as tolerated.  Awaiting room for SNF

## 2017-07-11 NOTE — Progress Notes (Signed)
Physical Therapy Treatment Patient Details Name: Jean Gonzales MRN: [id number] DOB: [birth year] Today's Date: 07/11/2017    History of Present Illness Pt is a 62 yo F diagnosed with osteoarthritis of the R knee and is s/p elective R TKA.  PMH includes: HTN, DM II, GERD, endometrial CA, and HLD.      PT Comments    Pt demonstrated transfers (min to mod assist) and ambulation 40 ft mod assist (+ 2 physical assist, w/ family member providing chair follow) (see mobility section for details).  Pt states that her pain is better than it was this morning, R knee pain 6/10; RN notified.  AROM (11-84) degrees, taken am session. PT will focus on progression of transfers, ROM, and ambulation at next visit.     Follow Up Recommendations  SNF     Equipment Recommendations  Other (comment);Rolling walker with 5" wheels;3in1 (PT)    Recommendations for Other Services       Precautions / Restrictions Precautions Precautions: Knee;Fall Precaution Booklet Issued: Yes (comment) Restrictions Weight Bearing Restrictions: Yes RLE Weight Bearing: Weight bearing as tolerated Other Position/Activity Restrictions: R TKR    Mobility  Bed Mobility Overal bed mobility: Needs Assistance Bed Mobility: Sit to Supine       Sit to supine: Mod assist;+2 for physical assistance   General bed mobility comments: assist to lift BLE from floor to bed, and boost up in bed (+2 to boost); vc's to scoot backward, and scoot up in bed w/ LLE bend and push through LLE heel.  Transfers Overall transfer level: Needs assistance Equipment used: Rolling walker (2 wheeled) Transfers: Sit to/from Stand Sit to Stand: Min assist;Mod assist         General transfer comment: assist to lift; vc's to scoot to edge of chair, lean forward nose over toes  Ambulation/Gait Ambulation/Gait assistance: Min assist;Mod assist;+2 safety/equipment Ambulation Distance (Feet): 40 Feet Assistive device: Rolling walker (2  wheeled) Gait Pattern/deviations: Decreased step length - left;Decreased stance time - right;Antalgic;Trunk flexed;Decreased step length - right;Step-through pattern Gait velocity: decreased   General Gait Details: assist for stability (+2 by NT for stability and safety, family member provided chair follow); vc's for stepping pattern, push through BUE on RW when bringing L foot forward to offweight RLE, lift chest, look forward, bring hips forward   Stairs             Wheelchair Mobility    Modified Rankin (Stroke Patients Only)       Balance Overall balance assessment: Needs assistance Sitting-balance support: Feet supported;No upper extremity supported Sitting balance-Leahy Scale: Good Sitting balance - Comments: good stability sitting EOB managing clothing   Standing balance support: Bilateral upper extremity supported Standing balance-Leahy Scale: Poor Standing balance comment: required RW CGA to min assist for stability; vc's to bring hips forward and head up                            Cognition Arousal/Alertness: Awake/alert Behavior During Therapy: WFL for tasks assessed/performed Overall Cognitive Status: Within Functional Limits for tasks assessed                                 General Comments: Pt A&O x 4      Exercises Total Joint Exercises Goniometric ROM: R knee AROM: 11-84 degrees taken (am session)    General Comments  Pt agreeable to session.  Pertinent Vitals/Pain Pain Assessment: 0-10 Pain Score: 6  Pain Location: R knee Pain Descriptors / Indicators: Aching;Sore;Operative site guarding Pain Intervention(s): Limited activity within patient's tolerance;Monitored during session;Premedicated before session;Repositioned    Home Living                      Prior Function            PT Goals (current goals can now be found in the care plan section) Acute Rehab PT Goals Patient Stated Goal: To walk  without pain PT Goal Formulation: With patient Time For Goal Achievement: 07/22/17 Potential to Achieve Goals: Good Progress towards PT goals: Progressing toward goals    Frequency    BID      PT Plan Current plan remains appropriate    Co-evaluation              AM-PAC PT "6 Clicks" Daily Activity  Outcome Measure  Difficulty turning over in bed (including adjusting bedclothes, sheets and blankets)?: A Lot Difficulty moving from lying on back to sitting on the side of the bed? : Unable Difficulty sitting down on and standing up from a chair with arms (e.g., wheelchair, bedside commode, etc,.)?: Unable Help needed moving to and from a bed to chair (including a wheelchair)?: A Little Help needed walking in hospital room?: A Lot Help needed climbing 3-5 steps with a railing? : Total 6 Click Score: 10    End of Session Equipment Utilized During Treatment: Gait belt Activity Tolerance: Patient limited by pain Patient left: with call bell/phone within reach;with SCD's reapplied;with family/visitor present;in bed;with bed alarm set(polar care applied and activated; B heels elevated by towel rolls) Nurse Communication: Mobility status PT Visit Diagnosis: Muscle weakness (generalized) (M62.81);Other abnormalities of gait and mobility (R26.89);Pain;Difficulty in walking, not elsewhere classified (R26.2) Pain - Right/Left: Right Pain - part of body: Knee     Time: 1517-6160 PT Time Calculation (min) (ACUTE ONLY): 28 min  Charges:                       G Codes:        Chellie Vanlue Mondrian-Pardue, SPT 07/11/2017, 3:40 PM

## 2017-07-11 NOTE — Progress Notes (Signed)
Urinary catheter removed.

## 2017-07-12 ENCOUNTER — Inpatient Hospital Stay: Payer: Medicare HMO

## 2017-07-12 LAB — CBC
HCT: 36.7 % (ref 35.0–47.0)
HEMOGLOBIN: 12.3 g/dL (ref 12.0–16.0)
MCH: 30 pg (ref 26.0–34.0)
MCHC: 33.6 g/dL (ref 32.0–36.0)
MCV: 89.4 fL (ref 80.0–100.0)
Platelets: 247 10*3/uL (ref 150–440)
RBC: 4.1 MIL/uL (ref 3.80–5.20)
RDW: 13.9 % (ref 11.5–14.5)
WBC: 11.9 10*3/uL — ABNORMAL HIGH (ref 3.6–11.0)

## 2017-07-12 LAB — BASIC METABOLIC PANEL
Anion gap: 8 (ref 5–15)
BUN: 12 mg/dL (ref 6–20)
CHLORIDE: 100 mmol/L — AB (ref 101–111)
CO2: 27 mmol/L (ref 22–32)
Calcium: 9.1 mg/dL (ref 8.9–10.3)
Creatinine, Ser: 0.95 mg/dL (ref 0.44–1.00)
GFR calc Af Amer: >60 mL/min (ref >60)
GFR calc non Af Amer: >60 mL/min (ref >60)
GLUCOSE: 130 mg/dL — AB (ref 65–99)
POTASSIUM: 3.8 mmol/L (ref 3.5–5.1)
Sodium: 135 mmol/L (ref 135–145)

## 2017-07-12 MED ORDER — HYDROCODONE-ACETAMINOPHEN 5-325 MG PO TABS: 1.0000 | ORAL_TABLET | ORAL | 0 refills | Status: DC | PRN

## 2017-07-12 MED ORDER — ENOXAPARIN SODIUM 40 MG/0.4ML ~~LOC~~ SOLN
40.0000 mg | SUBCUTANEOUS | Status: DC
  Administered 2017-07-12 – 2017-07-13 (×2): 40 mg via SUBCUTANEOUS
  Filled 2017-07-12 (×3): qty 0.4

## 2017-07-12 MED ORDER — PROMETHAZINE HCL 25 MG/ML IJ SOLN: 25.0000 mg | Freq: Four times a day (QID) | INTRAMUSCULAR | Status: DC | PRN

## 2017-07-12 MED ORDER — GABAPENTIN 300 MG PO CAPS: 300.0000 mg | ORAL_CAPSULE | Freq: Three times a day (TID) | ORAL | 0 refills | Status: DC

## 2017-07-12 MED ORDER — ASPIRIN 325 MG PO TBEC: 325.0000 mg | DELAYED_RELEASE_TABLET | Freq: Every day | ORAL | 0 refills | Status: DC

## 2017-07-12 MED ORDER — ONDANSETRON HCL 4 MG/2ML IJ SOLN
4.0000 mg | Freq: Four times a day (QID) | INTRAMUSCULAR | Status: AC
  Administered 2017-07-12 – 2017-07-13 (×3): 4 mg via INTRAVENOUS
  Filled 2017-07-12 (×3): qty 2

## 2017-07-12 MED ORDER — DOCUSATE SODIUM 100 MG PO CAPS: 100.0000 mg | ORAL_CAPSULE | Freq: Two times a day (BID) | ORAL | 0 refills | Status: DC

## 2017-07-12 MED ORDER — ACETAMINOPHEN 325 MG PO TABS: 325.0000 mg | ORAL_TABLET | Freq: Four times a day (QID) | ORAL | 0 refills | Status: DC | PRN

## 2017-07-12 NOTE — Progress Notes (Signed)
Pt complains of abdominal pain after given mag citrate. Pt unable to have BM. Zofran given for nausea. MD Harlow Mares notified. Verbal orders to give enema and will get hospitalist to see pt.

## 2017-07-12 NOTE — Progress Notes (Signed)
This nurse went to administer enema. Pt stated she was about to have a BM. Assisted pt to [facility]. Pt was able to have BM but still has abdominal pain. Pt going down for abdominal xray now.

## 2017-07-12 NOTE — Clinical Social Work Note (Signed)
CSW spoke with [employer], still awaiting insurance authorization.  CSW updated patient and her family.  Jones Broom. Norval Morton, MSW, [place]  07/12/2017 4:37 PM

## 2017-07-12 NOTE — Progress Notes (Signed)
Physical Therapy Treatment Patient Details Name: Jean Gonzales MRN: [id number] DOB: [birth year] Today's Date: 07/12/2017    History of Present Illness Pt is a 62 yo F diagnosed with osteoarthritis of the R knee and is s/p elective R TKA.  PMH includes: HTN, DM II, GERD, endometrial CA, and HLD.      PT Comments    Pt demonstrated bed mobility (supervision), transfers and ambulation ( min assist) (see mobility section for details). Pt requires quite a bit of extra time and effort for mobility, but is progressing. AROM R knee (5-94) degrees.  PT will focus on progression of bed mobility, transfers, and ambulation at next session.   Follow Up Recommendations  SNF     Equipment Recommendations  Other (comment);Rolling walker with 5" wheels;3in1 (PT)    Recommendations for Other Services       Precautions / Restrictions Precautions Precautions: Knee;Fall Precaution Booklet Issued: Yes (comment) Restrictions Weight Bearing Restrictions: Yes RLE Weight Bearing: Weight bearing as tolerated Other Position/Activity Restrictions: R TKR    Mobility  Bed Mobility Overal bed mobility: Needs Assistance Bed Mobility: Supine to Sit     Supine to sit: Supervision;HOB elevated     General bed mobility comments: Pt required quite a bit of extra time and effort to move from supine to sit EOB w/ HOB elevated  Transfers Overall transfer level: Needs assistance Equipment used: Rolling walker (2 wheeled) Transfers: Sit to/from Stand Sit to Stand: Min assist         General transfer comment: assist to lift; vc's to scoot to edge of chair, lean forward nose over toes, bring hands to RW  Ambulation/Gait Ambulation/Gait assistance: Min assist Ambulation Distance (Feet): 6 Feet Assistive device: Rolling walker (2 wheeled) Gait Pattern/deviations: Decreased step length - left;Decreased stance time - right;Antalgic;Trunk flexed;Decreased step length - right;Step-through pattern Gait  velocity: decreased   General Gait Details: assist for stability; vc's for stepping pattern, bring chest up and hips forward   Stairs             Wheelchair Mobility    Modified Rankin (Stroke Patients Only)       Balance Overall balance assessment: Needs assistance Sitting-balance support: Feet supported;No upper extremity supported Sitting balance-Leahy Scale: Good Sitting balance - Comments: good stability sitting EOB reaching within BOS   Standing balance support: Bilateral upper extremity supported Standing balance-Leahy Scale: Fair Standing balance comment: required RW CGA                            Cognition Arousal/Alertness: Awake/alert Behavior During Therapy: WFL for tasks assessed/performed Overall Cognitive Status: Within Functional Limits for tasks assessed                                 General Comments: Pt A&O x 4      Exercises Total Joint Exercises Ankle Circles/Pumps: AROM;Strengthening;Both;10 reps;Supine(HOB elevated) Quad Sets: AROM;Strengthening;Right;5 reps;Supine(HOB elevated) Heel Slides: AAROM;Strengthening;Right;5 reps;Supine(HOB elevated) Hip ABduction/ADduction: AAROM;Strengthening;Right;5 reps;Supine(HOB elevated) Straight Leg Raises: 5 reps;Strengthening;Right;AAROM;Supine(HOB elevated) Goniometric ROM: R knee AROM: 5-94 degrees taken (am session)    General Comments  Pt agreeable to session      Pertinent Vitals/Pain Pain Assessment: 0-10 Pain Score: 5  Pain Location: R knee Pain Descriptors / Indicators: Aching;Sore;Operative site guarding Pain Intervention(s): Monitored during session;Repositioned;Limited activity within patient's tolerance;Premedicated before session    Home Living  Prior Function            PT Goals (current goals can now be found in the care plan section) Acute Rehab PT Goals Patient Stated Goal: To walk without pain PT Goal Formulation:  With patient Time For Goal Achievement: 07/22/17 Potential to Achieve Goals: Good Progress towards PT goals: Progressing toward goals    Frequency    BID      PT Plan Current plan remains appropriate    Co-evaluation              AM-PAC PT "6 Clicks" Daily Activity  Outcome Measure  Difficulty turning over in bed (including adjusting bedclothes, sheets and blankets)?: A Little Difficulty moving from lying on back to sitting on the side of the bed? : A Little Difficulty sitting down on and standing up from a chair with arms (e.g., wheelchair, bedside commode, etc,.)?: Unable Help needed moving to and from a bed to chair (including a wheelchair)?: A Little Help needed walking in hospital room?: A Lot Help needed climbing 3-5 steps with a railing? : A Lot 6 Click Score: 14    End of Session Equipment Utilized During Treatment: Gait belt Activity Tolerance: Patient limited by pain Patient left: in chair;with call bell/phone within reach;with chair alarm set;with family/visitor present;with SCD's reapplied(polar care applied and activated; B heels elevated by towel rolls.) Nurse Communication: Mobility status(Pt requested to speak w/ case manager; RN notified) PT Visit Diagnosis: Muscle weakness (generalized) (M62.81);Other abnormalities of gait and mobility (R26.89);Pain;Difficulty in walking, not elsewhere classified (R26.2) Pain - Right/Left: Right Pain - part of body: Knee     Time: 1007-1040 PT Time Calculation (min) (ACUTE ONLY): 33 min  Charges:                       G Codes:       Tareek Sabo Mondrian-Pardue, SPT 07/12/2017, 1:24 PM

## 2017-07-12 NOTE — Care Management Important Message (Signed)
Important Message  Patient Details  Name: Jean Gonzales MRN: [id number] Date of Birth: 19-Oct-1955   Medicare Important Message Given:  Yes    Juliann Pulse A Zakariyah Freimark 07/12/2017, 10:38 AM

## 2017-07-12 NOTE — Progress Notes (Signed)
PT Cancellation Note  Patient Details Name: Jean Gonzales MRN: [id number] DOB: Jun 21, 1955   Cancelled Treatment:    Reason Eval/Treat Not Completed: Patient declined, no reason specified.  Pt on bedside commode upon PT arrival; 2 visitors present.  Therapist asked pt if she needed anything or needed assist with getting off of BSC; pt reporting not ready to get off BSC yet but had been successful with bowel movement.  Nursing reports plan for abdominal imaging soon (d/t abdominal pain).  Therapist asked pt if she thought she would be able to participate in PT a little bit later (d/t currently not available and pending imaging) but pt reporting that her stomach was not feeling well enough today to participate with PT (nursing present for discussion).  Will re-attempt PT treatment tomorrow if pt does not discharge as planned today.  Leitha Bleak, PT 07/12/17, 3:56 PM [phone]

## 2017-07-12 NOTE — Consult Note (Signed)
Jean Gonzales NAME: Jean Gonzales    MR#:  [id number]  DATE OF BIRTH:  1955/05/06  DATE OF ADMISSION:  07/09/2017  PRIMARY CARE PHYSICIAN: Mar Daring, PA-C   REQUESTING/REFERRING PHYSICIAN: Dr. Harlow Mares  CHIEF COMPLAINT:  No chief complaint on file.   HISTORY OF PRESENT ILLNESS:  Jean Gonzales  is a 62 y.o. female with a known history of hypertension, diabetes, arthritis and history of endometrial cancer in remission admitted to hospital for an elective right knee arthroplasty. Today's postoperative day 3, patient started experiencing right flank and lower abdominal pain.  Her last bowel movement was 4 days ago.  Medical consult requested for the same. She is also having nausea and vomiting, that got worse after taking magnesium citrate for constipation.  Denies any chest pain, fever or chills.  Cough or congestion.  Still has right knee pain from the surgery but able to work with physical therapy.  She just started having bowel movements.  Her abdominal pain is slightly better at this time  PAST MEDICAL HISTORY:   Past Medical History:  Diagnosis Date  . Arthritis    osteoarthritis right knee  . Cancer [facility]) 2014   endometrial CA  . Diabetes mellitus without complication ([place])   . Hyperlipidemia   . Hypertension     PAST SURGICAL HISTOIRY:   Past Surgical History:  Procedure Laterality Date  . ABDOMINAL HYSTERECTOMY  2015   cancer  . BREAST BIOPSY Right 2008ish   core  . CHOLECYSTECTOMY  1992  . TONSILLECTOMY  1973  . TONSILLECTOMY    . TOTAL KNEE ARTHROPLASTY Right 07/09/2017   Procedure: TOTAL KNEE ARTHROPLASTY;  Surgeon: Lovell Sheehan, MD;  Location: [facility] ORS;  Service: Orthopedics;  Laterality: Right;    SOCIAL HISTORY:   Social History   Tobacco Use  . Smoking status: Never Smoker  . Smokeless tobacco: Never Used  Substance Use Topics  . Alcohol use: No    Alcohol/week: 0.0 oz     FAMILY HISTORY:   Family History  Problem Relation Age of Onset  . Diabetes Mother   . Cancer Mother   . Heart disease Father   . Breast cancer Neg Hx   . Kidney cancer Neg Hx   . Bladder Cancer Neg Hx     DRUG ALLERGIES:   Allergies  Allergen Reactions  . Iodinated Diagnostic Agents Anaphylaxis  . Iodine Swelling    REVIEW OF SYSTEMS:   Review of Systems  Constitutional: Negative for chills, fever, malaise/fatigue and weight loss.  HENT: Negative for ear discharge, ear pain, hearing loss, nosebleeds and tinnitus.   Eyes: Negative for blurred vision, double vision and photophobia.  Respiratory: Negative for cough, hemoptysis, shortness of breath and wheezing.   Cardiovascular: Negative for chest pain, palpitations, orthopnea and leg swelling.  Gastrointestinal: Positive for abdominal pain, constipation, nausea and vomiting. Negative for diarrhea, heartburn and melena.  Genitourinary: Negative for dysuria, frequency, hematuria and urgency.  Musculoskeletal: Positive for joint pain. Negative for back pain, myalgias and neck pain.  Skin: Negative for rash.  Neurological: Negative for dizziness, tingling, tremors, sensory change, speech change, focal weakness and headaches.  Endo/Heme/Allergies: Does not bruise/bleed easily.  Psychiatric/Behavioral: Negative for depression.    MEDICATIONS AT HOME:   Prior to Admission medications   Medication Sig Start Date End Date Taking? Authorizing Provider  atorvastatin (LIPITOR) 40 MG tablet TAKE 1 TABLET BY MOUTH EVERY DAY Patient taking differently: TAKE  1 TABLET (40 MG) BY MOUTH EVERY DAY IN THE MORNING. 06/06/17  Yes Burnette, Clearnce Sorrel, PA-C  celecoxib (CELEBREX) 100 MG capsule Take 100 mg by mouth 2 (two) times daily. 06/09/17  Yes [provider]  conjugated estrogens (PREMARIN) vaginal cream Place 1 Applicatorful vaginally daily. APPLY TO URETHRA   Yes [provider]  diclofenac sodium (VOLTAREN) 1 % GEL  Apply 2 g topically 4 (four) times daily. Patient taking differently: Apply 2 g topically 4 (four) times daily. APPLIED TO KNEES. 06/04/17  Yes Burnette, Clearnce Sorrel, PA-C  NEXIUM 40 MG capsule Take 1 capsule (40 mg total) by mouth daily. Brand preferred by patient Patient taking differently: Take 40 mg by mouth daily before breakfast. Brand preferred by patient 04/30/17  Yes Burnette, Clearnce Sorrel, PA-C  TURMERIC CURCUMIN PO Take 2 capsules by mouth daily.   Yes [provider]  acetaminophen (TYLENOL) 325 MG tablet Take 1-2 tablets (325-650 mg total) by mouth every 6 (six) hours as needed for mild pain (pain score 1-3 or temp > 100.5). 07/12/17   Lovell Sheehan, MD  aspirin EC 325 MG EC tablet Take 1 tablet (325 mg total) by mouth daily with breakfast. 07/13/17   Lovell Sheehan, MD  docusate sodium (COLACE) 100 MG capsule Take 1 capsule (100 mg total) by mouth 2 (two) times daily. 07/12/17   Lovell Sheehan, MD  gabapentin (NEURONTIN) 300 MG capsule Take 1 capsule (300 mg total) by mouth 3 (three) times daily. 07/12/17   Lovell Sheehan, MD  HYDROcodone-acetaminophen (NORCO/VICODIN) 5-325 MG tablet Take 1-2 tablets by mouth every 4 (four) hours as needed for moderate pain (pain score 4-6). 07/12/17   Lovell Sheehan, MD  zolpidem (AMBIEN) 10 MG tablet Take 1 tablet (10 mg total) by mouth at bedtime as needed for sleep. OK to fill early; titrating dose 06/15/17 07/15/17  Mar Daring, PA-C      VITAL SIGNS:  Blood pressure (!) 119/91, pulse 90, temperature 98.5 F (36.9 C), resp. rate 16, height 5\' 8"  (1.727 m), weight 110.2 kg (243 lb), SpO2 99 %.  PHYSICAL EXAMINATION:   Physical Exam  GENERAL:  62 y.o.-year-old patient lying in the bed with no acute distress.  EYES: Pupils equal, round, reactive to light and accommodation. No scleral icterus. Extraocular muscles intact.  HEENT: Head atraumatic, normocephalic. Oropharynx and nasopharynx clear.  NECK:  Supple, no jugular venous  distention. No thyroid enlargement, no tenderness.  LUNGS: Normal breath sounds bilaterally, no wheezing, rales,rhonchi or crepitation. No use of accessory muscles of respiration.  CARDIOVASCULAR: S1, S2 normal. No murmurs, rubs, or gallops.  ABDOMEN: Soft, nontender, nondistended. Bowel sounds present. No organomegaly or mass.  EXTREMITIES: right knee dressing in place.  No pedal edema, cyanosis, or clubbing.  NEUROLOGIC: Cranial nerves II through XII are intact. Muscle strength 5/5 in all extremities. Sensation intact. Gait not checked.  PSYCHIATRIC: The patient is alert and oriented x 3.  SKIN: No obvious rash, lesion, or ulcer.   LABORATORY PANEL:   CBC Recent Labs  Lab 07/12/17 0353  WBC 11.9*  HGB 12.3  HCT 36.7  PLT 247   ------------------------------------------------------------------------------------------------------------------  Chemistries  Recent Labs  Lab 07/10/17 0518  NA 136  K 3.9  CL 107  CO2 25  GLUCOSE 132*  BUN 11  CREATININE 0.88  CALCIUM 8.5*   ------------------------------------------------------------------------------------------------------------------  Cardiac Enzymes No results for input(s): TROPONINI in the last 168 hours. ------------------------------------------------------------------------------------------------------------------  RADIOLOGY:  Dg Abd 1 View  Result Date: 07/12/2017 CLINICAL DATA:  Abdominal pain EXAM: ABDOMEN - 1 VIEW COMPARISON:  None. FINDINGS: Normal bowel gas pattern. Moderate amount of stool in the colon. No renal calculi. No focal bony lesion. Surgical clips in the pelvis. IMPRESSION: Moderate stool in the colon.  Negative for bowel obstruction. Electronically Signed   By: Franchot Gallo M.D.   On: 07/12/2017 16:46    EKG:   Orders placed or performed during the hospital encounter of 07/09/17  . EKG 12-Lead  . EKG 12-Lead    IMPRESSION AND PLAN:   Chery Giusto  is a 62 y.o. female with a known  history of hypertension, diabetes, arthritis and history of endometrial cancer in remission admitted to hospital for an elective right knee arthroplasty. Medical consult requested for abdominal pain  1.  Abdominal pain-likely secondary to constipation, could be gas pain -Finally her laxatives are working, she just started having bowel movements -KUB with no ileus or obstruction. -She still has her appendix seen, so if pain does not improve, recommend a CT abdomen.  Less likely however, no other signs of peritonitis. -Start on liquid diet and advance as tolerated with her nausea.  2.  Right total knee arthroplasty-postop day 3 -Continue pain control by Ortho. -Discharge to rehab when able to  3. Endometrial cancer-status post chemoradiation.  Follows up with GYN oncologist.  Premarin vaginal cream ordered.  4.  Hyperlipidemia-continue statin  5.  GERD-currently on Protonix  6.  DVT prophylaxis- recommend lovenox    All the records are reviewed and case discussed with Consulting provider. Management plans discussed with the patient, family and they are in agreement.  CODE STATUS: Full Code  TOTAL TIME TAKING CARE OF THIS PATIENT: 50 minutes.    Gladstone Lighter M.D on 07/12/2017 at 5:15 PM  Between 7am to 6pm - Pager - [phone]  After 6pm go to [email or web address] - password [email or web address] [facility]  [place] [facility]  Office  [phone]  CC: Primary care Physician: Mar Daring, PA-C

## 2017-07-12 NOTE — Discharge Summary (Deleted)
Physician Discharge Summary  Patient ID: Jean Gonzales MRN: [id number] DOB/AGE: Jan 02, 1956 62 y.o.  Admit date: 07/09/2017 Discharge date: 07/12/2017  Admission Diagnoses:  M17.11 Unilateral primary osteoartthritis, right knee <principal problem not specified>  Discharge Diagnoses:  M17.11 Unilateral primary osteoartthritis, right knee Active Problems:   Osteoarthritis of right knee   Past Medical History:  Diagnosis Date  . Arthritis    osteoarthritis right knee  . Cancer [facility]) 2014   endometrial CA  . Diabetes mellitus without complication ([place])   . Hyperlipidemia   . Hypertension     Surgeries: Procedure(s): TOTAL KNEE ARTHROPLASTY on 07/09/2017   Consultants (if any):   Discharged Condition: Improved  Hospital Course: Jean Gonzales is an 62 y.o. female who was admitted 07/09/2017 with a diagnosis of  M17.11 Unilateral primary osteoartthritis, right knee <principal problem not specified> and went to the operating room on 07/09/2017 and underwent the above named procedures.    She was given perioperative antibiotics:  Anti-infectives (From admission, onward)   Start     Dose/Rate Route Frequency Ordered Stop   07/09/17 1400  ceFAZolin (ANCEF) IVPB 2g/100 mL premix     2 g 200 mL/hr over 30 Minutes Intravenous Every 6 hours 07/09/17 1200 07/09/17 2147   07/09/17 0836  bacitracin injection  Status:  Discontinued       As needed 07/09/17 0837 07/09/17 0954   07/09/17 0600  ceFAZolin (ANCEF) IVPB 2g/100 mL premix     2 g 200 mL/hr over 30 Minutes Intravenous On call to O.R. 07/08/17 2137 07/09/17 0752   07/09/17 0553  ceFAZolin (ANCEF) 2-3 GM-%(50ML) IVPB SOLR    Note to Pharmacy:  [name]   : cabinet override      07/09/17 0553 07/09/17 1814    .  She was given sequential compression devices, early ambulation, and ECASA for DVT prophylaxis.  She benefited maximally from the hospital stay and there were no complications.    Recent vital signs:   Vitals:   07/11/17 2035 07/11/17 2354  BP: (!) 110/46 (!) 122/56  Pulse: 88 79  Resp: 16   Temp: 98.9 F (37.2 C) 98.4 F (36.9 C)  SpO2: 92% 92%    Recent laboratory studies:  Lab Results  Component Value Date   HGB 12.3 07/12/2017   HGB 11.7 (L) 07/11/2017   HGB 11.9 (L) 07/10/2017   Lab Results  Component Value Date   WBC 11.9 (H) 07/12/2017   PLT 247 07/12/2017   Lab Results  Component Value Date   INR 0.94 07/04/2017   Lab Results  Component Value Date   NA 136 07/10/2017   K 3.9 07/10/2017   CL 107 07/10/2017   CO2 25 07/10/2017   BUN 11 07/10/2017   CREATININE 0.88 07/10/2017   GLUCOSE 132 (H) 07/10/2017    Discharge Medications:   Allergies as of 07/12/2017      Reactions   Iodinated Diagnostic Agents Anaphylaxis   Iodine Swelling      Medication List    STOP taking these medications   diclofenac sodium 1 % Gel Commonly known as:  VOLTAREN   PREMARIN vaginal cream Generic drug:  conjugated estrogens   TURMERIC CURCUMIN PO     TAKE these medications   acetaminophen 325 MG tablet Commonly known as:  TYLENOL Take 1-2 tablets (325-650 mg total) by mouth every 6 (six) hours as needed for mild pain (pain score 1-3 or temp > 100.5).   aspirin 325 MG EC tablet  Take 1 tablet (325 mg total) by mouth daily with breakfast. Start taking on:  07/13/2017   atorvastatin 40 MG tablet Commonly known as:  LIPITOR TAKE 1 TABLET BY MOUTH EVERY DAY What changed:    how much to take  how to take this  when to take this   celecoxib 100 MG capsule Commonly known as:  CELEBREX Take 100 mg by mouth 2 (two) times daily.   docusate sodium 100 MG capsule Commonly known as:  COLACE Take 1 capsule (100 mg total) by mouth 2 (two) times daily.   gabapentin 300 MG capsule Commonly known as:  NEURONTIN Take 1 capsule (300 mg total) by mouth 3 (three) times daily.   HYDROcodone-acetaminophen 5-325 MG tablet Commonly known as:  NORCO/VICODIN Take 1-2  tablets by mouth every 4 (four) hours as needed for moderate pain (pain score 4-6).   NEXIUM 40 MG capsule Generic drug:  esomeprazole Take 1 capsule (40 mg total) by mouth daily. Brand preferred by patient What changed:    when to take this  additional instructions   zolpidem 10 MG tablet Commonly known as:  AMBIEN Take 1 tablet (10 mg total) by mouth at bedtime as needed for sleep. OK to fill early; titrating dose       Diagnostic Studies: Mr Knee Right Wo Contrast  Result Date: 06/19/2017 CLINICAL DATA:  Pre-surgical planning prior to arthroplasty EXAM: MRI OF THE RIGHT KNEE WITHOUT CONTRAST TECHNIQUE: Multiplanar, multisequence MR imaging of the knee was performed. No intravenous contrast was administered. COMPARISON:  None. FINDINGS: Limited evaluation for internal derangement secondary to a single sagittal PD series being performed. Examination was performed for preoperative planning prior to unilateral hemiarthroplasty. MENISCI Medial meniscus: Partial radial tear of the posterior horn of the medial meniscus. Lateral meniscus: Radial tear of the posterior horn of the lateral meniscus. LIGAMENTS Cruciates:  Intact ACL and PCL. Collaterals: Not well delineated. CARTILAGE Patellofemoral:  Limited evaluation secondary to single PD series. Medial:   Limited evaluation secondary to single PD series. Lateral:  Limited evaluation secondary to single PD series. Joint: Small joint effusion. Unremarkable Hoffa's fat. Mildly thickened prominent suprapatellar plica. Popliteal Fossa:  No Baker's cyst.  Intact popliteus tendon. Extensor Mechanism: Intact quadriceps tendon. Intact patellar tendon. Bones:  No acute osseous abnormality.  No aggressive osseous lesion. Other: No fluid collection or hematoma.  Muscles are grossly normal. IMPRESSION: 1. Partial radial tear of the posterior horn of the medial meniscus. 2. Radial tear of the posterior horn of the lateral meniscus. Electronically Signed   By:  Kathreen Devoid   On: 06/19/2017 09:49   Dg Knee Right Port  Result Date: 07/09/2017 CLINICAL DATA:  62 year old female status post right total knee replacement. EXAM: PORTABLE RIGHT KNEE - 1-2 VIEW COMPARISON:  Right knee series 07/24/2016. FINDINGS: Portable AP and cross-table lateral views of the right knee. Total knee arthroplasty hardware in place, appears intact, and normally aligned. Postoperative air in fluid in the joint. Anterior skin staples. No unexpected osseous changes. IMPRESSION: Right total knee arthroplasty with no adverse features. Electronically Signed   By: Genevie Ann M.D.   On: 07/09/2017 10:30   Dg Bone Length  Result Date: 06/19/2017 CLINICAL DATA:  Right knee pain for 8 months.  No known injury. EXAM: BONE LENGTH COMPARISON:  None. FINDINGS: The right hip demonstrates no fracture or dislocation. The joint space is maintained. The right knee demonstrates no acute fracture or dislocation. The right ankle demonstrates no fracture or dislocation. The  ankle mortise is intact. The soft tissues are normal. There is no aggressive osseous lesion. The right leg measures 96.7 cm from the superior aspect of the femoral head to the mid tibial plafond and. There is genu valgus of right knee. IMPRESSION: The right leg measures 96.7 cm from the superior aspect of the femoral head to the mid tibial plafond and. There is genu valgus of right knee. Electronically Signed   By: Kathreen Devoid   On: 06/19/2017 08:52    Disposition:     Contact information for after-discharge care    Destination    [facility] .   Service:  Skilled Nursing Contact information: [address] [place] [phone]               Signed: Lovell Sheehan ,MD 07/12/2017, 7:51 AM

## 2017-07-12 NOTE — Discharge Instructions (Signed)
Continue weight bear as tolerated on the right lower extremity.   ° °Elevate the right lower extremity whenever possible and continue the polar care while elevating the extremity. Patient may shower. No bath or submerging the wound.   ° °Take ECASA as directed for blood clot prevention. ° °Continue to work on knee range of motion exercises at home as instructed by physical therapy. Continue to use a walker for assistance with ambulation until cleared by physical therapy. ° °Call [phone] with any questions, such as fever > 101.5 degrees, drainage from the wound or shortness of breath. ° ° °

## 2017-07-13 ENCOUNTER — Inpatient Hospital Stay: Payer: Medicare HMO

## 2017-07-13 DIAGNOSIS — R109 Unspecified abdominal pain: Secondary | ICD-10-CM

## 2017-07-13 DIAGNOSIS — Z09 Encounter for follow-up examination after completed treatment for conditions other than malignant neoplasm: Secondary | ICD-10-CM

## 2017-07-13 MED ORDER — BARIUM SULFATE 2.1 % PO SUSP
450.0000 mL | ORAL | Status: AC
Start: 2017-07-13 — End: 2017-07-13
  Administered 2017-07-13 (×2): 450 mL via ORAL

## 2017-07-13 MED ORDER — ALUM & MAG HYDROXIDE-SIMETH 200-200-20 MG/5ML PO SUSP: 30.0000 mL | Freq: Four times a day (QID) | ORAL | Status: DC | PRN

## 2017-07-13 NOTE — Progress Notes (Addendum)
Pt had CT abd and pelvis for rt abd pain. Radiologist dr.Wagner called and reported  Right-sided hydronephrosis with perinephric fluid, likely spontaneous decompression, with persistently dilated right ureter. No radiopaque stones identified. The differential includes distal ureteral obstruction secondary to non radiopaque stones, blood/debris, ascending urinary tract infection, as well as obstructing ureterocele in the bladder. Recurrent tumor as a urinary obstruction cannot be excluded given the patient's history Urology consult obtained urgent and paged dr.Stoioff ( on call urologist ) times 2 , discussed with him. Foley placed. D/w dr.Bowers and patient, agreeable with current plan. Pt was seen earlier intubated 2019 by urology [facility] for urinary retention.

## 2017-07-13 NOTE — Progress Notes (Addendum)
Physical Therapy Treatment Patient Details Name: Jean Gonzales MRN: [id number] DOB: [birth year] Today's Date: 07/13/2017    History of Present Illness Pt is a 62 yo F diagnosed with osteoarthritis of the R knee and is s/p elective R TKA.  PMH includes: HTN, DM II, GERD, endometrial CA, and HLD.      PT Comments    Pt demonstrated bed mobility (supervision), transfers and ambulation (40 ft, 40 ft) (min assist) (see mobility section for details). Pt continues to be limited by pain and fatigue; requiring a considerable amount of extra time and effort for mobility.  R knee AROM (4-94) degrees (am session).  PT will focus on progression of transfers, ambulation, and activity tolerance at next session.   Follow Up Recommendations  SNF     Equipment Recommendations  Other (comment);Rolling walker with 5" wheels;3in1 (PT)    Recommendations for Other Services       Precautions / Restrictions Precautions Precautions: Knee;Fall Precaution Booklet Issued: Yes (comment) Restrictions Weight Bearing Restrictions: Yes RLE Weight Bearing: Weight bearing as tolerated Other Position/Activity Restrictions: R TKR    Mobility  Bed Mobility Overal bed mobility: Needs Assistance Bed Mobility: Supine to Sit     Supine to sit: Supervision;HOB elevated     General bed mobility comments: Pt transferred from supine to sit requiring extra time and effort; with improved technique  Transfers Overall transfer level: Needs assistance Equipment used: Rolling walker (2 wheeled) Transfers: Sit to/from Stand Sit to Stand: Min assist         General transfer comment: assist to lift; vc's to scoot to edge of chair, lean forward nose over toes, bring hands to RW, bring hips forward and head up  Ambulation/Gait Ambulation/Gait assistance: Min assist Ambulation Distance (Feet): 80 Feet(40, 40) Assistive device: Rolling walker (2 wheeled) Gait Pattern/deviations: Decreased step length -  left;Decreased stance time - right;Antalgic;Trunk flexed;Decreased step length - right;Step-through pattern Gait velocity: decreased   General Gait Details: assist for stability; vc's for stepping pattern, use hip flexion to progress RLE, heel toe RLE, push through BUE of RW to progress LLE. Pt required sitting rest break at 40 ft; then ambulated another 40 ft and required to sit; Pt rolled back to room.   Stairs             Wheelchair Mobility    Modified Rankin (Stroke Patients Only)       Balance Overall balance assessment: Needs assistance Sitting-balance support: Feet supported;No upper extremity supported Sitting balance-Leahy Scale: Good Sitting balance - Comments: good stability sitting EOB reaching within BOS managing clothing   Standing balance support: Bilateral upper extremity supported Standing balance-Leahy Scale: Fair Standing balance comment: required RW CGA                            Cognition Arousal/Alertness: Awake/alert Behavior During Therapy: WFL for tasks assessed/performed Overall Cognitive Status: Within Functional Limits for tasks assessed                                 General Comments: Pt A&O x 4      Exercises Total Joint Exercises Ankle Circles/Pumps: AROM;Strengthening;Both;10 reps;Supine(HOB elevated) Quad Sets: AROM;Strengthening;Right;5 reps;Supine(HOB elevated) Straight Leg Raises: AAROM;Strengthening;Right;5 reps;Supine(HOB elevated) Goniometric ROM: R knee AROM 4-94 (am session)    General Comments  Pt agreeable to session      Pertinent Vitals/Pain Pain Assessment: 0-10  Pain Score: 4  Pain Location: R knee Pain Descriptors / Indicators: Aching;Sore;Operative site guarding Pain Intervention(s): Limited activity within patient's tolerance;Monitored during session;Repositioned;Ice applied    Home Living                      Prior Function            PT Goals (current goals can now  be found in the care plan section) Acute Rehab PT Goals Patient Stated Goal: To walk without pain PT Goal Formulation: With patient Time For Goal Achievement: 07/22/17 Potential to Achieve Goals: Good Progress towards PT goals: Progressing toward goals    Frequency    BID      PT Plan Current plan remains appropriate    Co-evaluation              AM-PAC PT "6 Clicks" Daily Activity  Outcome Measure  Difficulty turning over in bed (including adjusting bedclothes, sheets and blankets)?: A Little Difficulty moving from lying on back to sitting on the side of the bed? : A Little Difficulty sitting down on and standing up from a chair with arms (e.g., wheelchair, bedside commode, etc,.)?: Unable Help needed moving to and from a bed to chair (including a wheelchair)?: A Little Help needed walking in hospital room?: A Little Help needed climbing 3-5 steps with a railing? : A Lot 6 Click Score: 15    End of Session Equipment Utilized During Treatment: Gait belt Activity Tolerance: Patient limited by pain;Patient limited by fatigue Patient left: in chair;with call bell/phone within reach;with chair alarm set;with family/visitor present;with SCD's reapplied Nurse Communication: Mobility status PT Visit Diagnosis: Muscle weakness (generalized) (M62.81);Other abnormalities of gait and mobility (R26.89);Pain;Difficulty in walking, not elsewhere classified (R26.2) Pain - Right/Left: Right Pain - part of body: Knee     Time: 0830-0910 PT Time Calculation (min) (ACUTE ONLY): 40 min  Charges:                       G Codes:       Taji Sather Mondrian-Pardue, SPT 07/13/2017, 9:29 AM

## 2017-07-13 NOTE — Progress Notes (Signed)
PT Cancellation Note  Patient Details Name: Jean Gonzales MRN: [id number] DOB: 08-Aug-1955   Cancelled Treatment:      PT consult received, chart reviewed. Per RN, Pt is awaiting abdominal CT scan, reporting high level abdominal pain, and not appropriate for PT at this time. Will reattempt treatment at a later date/time when medically appropriate.   Jean Gonzales, SPT 07/13/2017, 3:16 PM

## 2017-07-13 NOTE — Progress Notes (Signed)
[facility] [place] NAME: Jean Gonzales    MR#:  [id number]  DATE OF BIRTH:  September 25, 1955  SUBJECTIVE:  CHIEF COMPLAINT: Patient is reporting right-sided abdominal pain radiating to suprapubic area.  Had a bowel movement.  Denies any nausea but feels distended and bloated  REVIEW OF SYSTEMS:  CONSTITUTIONAL: No fever, fatigue or weakness.  EYES: No blurred or double vision.  EARS, NOSE, AND THROAT: No tinnitus or ear pain.  RESPIRATORY: No cough, shortness of breath, wheezing or hemoptysis.  CARDIOVASCULAR: No chest pain, orthopnea, edema.  GASTROINTESTINAL: No nausea, vomiting, diarrhea . patient is reporting right-sided abdominal pain and distention GENITOURINARY: No dysuria, hematuria.  ENDOCRINE: No polyuria, nocturia,  HEMATOLOGY: No anemia, easy bruising or bleeding SKIN: No rash or lesion. MUSCULOSKELETAL: No joint pain or arthritis.   NEUROLOGIC: No tingling, numbness, weakness.  PSYCHIATRY: No anxiety or depression.   DRUG ALLERGIES:   Allergies  Allergen Reactions  . Iodinated Diagnostic Agents Anaphylaxis  . Iodine Swelling    VITALS:  Blood pressure 111/65, pulse (!) 110, temperature 97.9 F (36.6 C), temperature source Oral, resp. rate 18, height 5\' 8"  (1.727 m), weight 110.2 kg (243 lb), SpO2 96 %.  PHYSICAL EXAMINATION:  GENERAL:  62 y.o.-year-old patient lying in the bed with no acute distress.  EYES: Pupils equal, round, reactive to light and accommodation. No scleral icterus. Extraocular muscles intact.  HEENT: Head atraumatic, normocephalic. Oropharynx and nasopharynx clear.  NECK:  Supple, no jugular venous distention. No thyroid enlargement, no tenderness.  LUNGS: Normal breath sounds bilaterally, no wheezing, rales,rhonchi or crepitation. No use of accessory muscles of respiration.  CARDIOVASCULAR: S1, S2 normal. No murmurs, rubs, or gallops.  ABDOMEN: Soft, right lower quadrant is tender but no  rebound tenderness, distended. Bowel sounds present.   EXTREMITIES: No pedal edema, cyanosis, or clubbing.  NEUROLOGIC: Cranial nerves II through XII are intact. Muscle strength 5/5 in all extremities. Sensation intact. Gait not checked.  PSYCHIATRIC: The patient is alert and oriented x 3.  SKIN: No obvious rash, lesion, or ulcer.    LABORATORY PANEL:   CBC Recent Labs  Lab 07/12/17 0353  WBC 11.9*  HGB 12.3  HCT 36.7  PLT 247   ------------------------------------------------------------------------------------------------------------------  Chemistries  Recent Labs  Lab 07/12/17 1704  NA 135  K 3.8  CL 100*  CO2 27  GLUCOSE 130*  BUN 12  CREATININE 0.95  CALCIUM 9.1   ------------------------------------------------------------------------------------------------------------------  Cardiac Enzymes No results for input(s): TROPONINI in the last 168 hours. ------------------------------------------------------------------------------------------------------------------  RADIOLOGY:  Dg Abd 1 View  Result Date: 07/12/2017 CLINICAL DATA:  Abdominal pain EXAM: ABDOMEN - 1 VIEW COMPARISON:  None. FINDINGS: Normal bowel gas pattern. Moderate amount of stool in the colon. No renal calculi. No focal bony lesion. Surgical clips in the pelvis. IMPRESSION: Moderate stool in the colon.  Negative for bowel obstruction. Electronically Signed   By: Franchot Gallo M.D.   On: 07/12/2017 16:46    EKG:   Orders placed or performed during the hospital encounter of 07/09/17  . EKG 12-Lead  . EKG 12-Lead    ASSESSMENT AND PLAN:    Jean Gonzales  is a 62 y.o. female with a known history of hypertension, diabetes, arthritis and history of endometrial cancer in remission admitted to hospital for an elective right knee arthroplasty. Medical consult requested for abdominal pain  1.  Abdominal pain-likely secondary to constipation, could be gas pain -Patient is still reporting  right-sided  abdominal pain and feels distended -KUB with no ileus or obstruction.  Has stool but patient had a bowel movement -Will get a CT scan of the abdomen pelvis -Out of bed to chair and activity as tolerated as per PT -Mylanta and pain medications as needed - on liquid diet and advance as tolerated with her nausea.  2.  Right total knee arthroplasty-postop day 3 -Continue pain control by Ortho. -Discharge to rehab when able to  3. Endometrial cancer-status post chemoradiation.   Follows up with GYN oncologist.  Premarin vaginal cream ordered.  4.  Hyperlipidemia-continue statin  5.  GERD-currently on Protonix  6.  DVT prophylaxis- recommend lovenox      All the records are reviewed and case discussed with Care Management/Social Workerr. Management plans discussed with the patient, family and they are in agreement.  CODE STATUS: fc   TOTAL TIME TAKING CARE OF THIS PATIENT: 34 minutes.     Note: This dictation was prepared with Dragon dictation along with smaller phrase technology. Any transcriptional errors that result from this process are unintentional.   Nicholes Mango M.D on 07/13/2017 at 12:57 PM  Between 7am to 6pm - Pager - [phone] After 6pm go to [email or web address] - password [email or web address] [facility]  Office  [phone]  CC: Primary care physician; Mar Daring, PA-C

## 2017-07-13 NOTE — Progress Notes (Signed)
Patient is medically stable for D/C to [facility] today. Per [facility] authorization has been received and patient can come today to room 601. RN will call report at [phone] and arrange EMS for transport. Clinical Education officer, museum (CSW) sent D/C orders to [facility] via [facility]. Patient is aware of above. Patient's fiance is at bedside and aware of above. Please reconsult if future social work needs arise. CSW signing off.   [employer], LCSW [phone]

## 2017-07-13 NOTE — Consult Note (Signed)
Urology Consult  I have been asked to see the patient by Dr. Margaretmary Eddy, for evaluation and management of right abdominal pain and right hydronephrosis.  Chief Complaint: Abdominal pain  History of Present Illness: Jean Gonzales is a 62 y.o. year old admitted on 07/09/2017 where she underwent a right total knee arthroplasty.  She has a history of urethral stenosis and urinary retention and has seen urology at [facility] and at [facility]. She refused to have her Foley catheter removed on 07/10/2017 and it was subsequently removed on 4/17.  Approximately 24 hours after catheter removal she developed right lower quadrant abdominal pain.  She was initially thought to be constipated however had persistent pain after a bowel movement.  She had some radiation of the pain to the right flank region.  She had some nausea and vomiting which worsened after taking magnesium citrate.  Hospitalist was consulted and it was felt her pain was most likely GI in origin. This morning her nausea had resolved however she complained of persistent pain and feeling distended.  A CT scan of the abdomen and pelvis without contrast was performed today which showed moderate right hydronephrosis and hydroureter to the ureterovesical junction.  Perinephric stranding and perinephric and retroperitoneal fluid was also noted.  There was mild left hydronephrosis and hydroureter.  Her bladder was distended above the pelvis to the region of the umbilicus.  She states she has been voiding frequently with good volumes.  She has a prior history of urinary retention and on previous CTs performed at [facility] was noted to have moderate right hydronephrosis and mild left hydronephrosis with episodes of urinary retention.  A CT of the abdomen and pelvis performed in August 2018 showed no hydronephrosis however her bladder was decompressed.  Dr. Margaretmary Eddy had indicated a Foley catheter was ordered however I did not see an order in Epic.  Her Foley  catheter has not been placed at the time she was seen.   Past Medical History:  Diagnosis Date  . Arthritis    osteoarthritis right knee  . Cancer [facility]) 2014   endometrial CA  . Diabetes mellitus without complication ([place])   . Hyperlipidemia   . Hypertension     Past Surgical History:  Procedure Laterality Date  . ABDOMINAL HYSTERECTOMY  2015   cancer  . BREAST BIOPSY Right 2008ish   core  . CHOLECYSTECTOMY  1992  . TONSILLECTOMY  1973  . TONSILLECTOMY    . TOTAL KNEE ARTHROPLASTY Right 07/09/2017   Procedure: TOTAL KNEE ARTHROPLASTY;  Surgeon: Lovell Sheehan, MD;  Location: [facility] ORS;  Service: Orthopedics;  Laterality: Right;    Home Medications:  Current Meds  Medication Sig  . atorvastatin (LIPITOR) 40 MG tablet TAKE 1 TABLET BY MOUTH EVERY DAY (Patient taking differently: TAKE 1 TABLET (40 MG) BY MOUTH EVERY DAY IN THE MORNING.)  . celecoxib (CELEBREX) 100 MG capsule Take 100 mg by mouth 2 (two) times daily.  Marland Kitchen conjugated estrogens (PREMARIN) vaginal cream Place 1 Applicatorful vaginally daily. APPLY TO URETHRA  . diclofenac sodium (VOLTAREN) 1 % GEL Apply 2 g topically 4 (four) times daily. (Patient taking differently: Apply 2 g topically 4 (four) times daily. APPLIED TO KNEES.)  . NEXIUM 40 MG capsule Take 1 capsule (40 mg total) by mouth daily. Brand preferred by patient (Patient taking differently: Take 40 mg by mouth daily before breakfast. Brand preferred by patient)  . TURMERIC CURCUMIN PO Take 2 capsules by mouth daily.  . [  DISCONTINUED] PREMARIN vaginal cream APPLY SMALL AMOUNT TO URETHRA DAILY    Allergies:  Allergies  Allergen Reactions  . Iodinated Diagnostic Agents Anaphylaxis  . Iodine Swelling    Family History  Problem Relation Age of Onset  . Diabetes Mother   . Cancer Mother   . Heart disease Father   . Breast cancer Neg Hx   . Kidney cancer Neg Hx   . Bladder Cancer Neg Hx     Social History:  reports that she has never smoked. She has  never used smokeless tobacco. She reports that she does not drink alcohol or use drugs.  ROS: A complete review of systems was performed.  All systems are negative except for pertinent findings as noted.  Physical Exam:  Vital signs in last 24 hours: Temp:  [97.8 F (36.6 C)-98.6 F (37 C)] 98.6 F (37 C) (04/19 1653) Pulse Rate:  [69-110] 76 (04/19 1653) Resp:  [18-19] 18 (04/19 1653) BP: (111-143)/(65-84) 143/67 (04/19 1653) SpO2:  [93 %-97 %] 93 % (04/19 1653) Constitutional:  Alert and oriented, No acute distress HEENT: [place] AT, moist mucus membranes.  Trachea midline, no masses Cardiovascular: Regular rate and rhythm, no clubbing, cyanosis, or edema. Respiratory: Normal respiratory effort, lungs clear bilaterally GI: Abdomen is soft, distended with mild tenderness GU: No CVA tenderness Skin: No rashes, bruises or suspicious lesions Lymph: No cervical or inguinal adenopathy Neurologic: Grossly intact, no focal deficits, moving all 4 extremities Psychiatric: Normal mood and affect   Laboratory Data:  Recent Labs    07/11/17 0358 07/12/17 0353  WBC 9.9 11.9*  HGB 11.7* 12.3  HCT 34.9* 36.7   Recent Labs    07/12/17 1704  NA 135  K 3.8  CL 100*  CO2 27  GLUCOSE 130*  BUN 12  CREATININE 0.95  CALCIUM 9.1   No results for input(s): LABPT, INR in the last 72 hours. No results for input(s): LABURIN in the last 72 hours. Results for orders placed or performed during the hospital encounter of 07/04/17  Surgical pcr screen     Status: Abnormal   Collection Time: 07/04/17  9:52 AM  Result Value Ref Range Status   MRSA, PCR NEGATIVE NEGATIVE Final   Staphylococcus aureus POSITIVE (A) NEGATIVE Final    Comment: (NOTE) The Xpert SA Assay (FDA approved for NASAL specimens in patients 35 years of age and older), is one component of a comprehensive surveillance program. It is not intended to diagnose infection nor to guide or monitor treatment. Performed at [facility], [address], [place], [place] [address]      Radiologic Imaging: Ct Abdomen Pelvis Wo Contrast  Result Date: 07/13/2017 CLINICAL DATA:  62 year old female with a history of right-sided abdominal pain radiating to the suprapubic region. Patient has a history of endometrial carcinoma. EXAM: CT ABDOMEN AND PELVIS WITHOUT CONTRAST TECHNIQUE: Multidetector CT imaging of the abdomen and pelvis was performed following the standard protocol without IV contrast. COMPARISON:  CT 11/21/2016 FINDINGS: Lower chest: No acute abnormality. Hepatobiliary: Unremarkable appearance of liver.  Cholecystectomy Pancreas: Unremarkable appearance of the pancreas Spleen: Unremarkable spleen Adrenals/Urinary Tract: Unremarkable adrenal glands. Right kidney demonstrates hydronephrosis with perinephric stranding with fluid in the perinephric space and extending inferiorly into the pelvis along the ureter. Fluid adjacent to the duodenum within the retroperitoneal space. The length of the right ureter is dilated with no radiopaque stones. Ureter is dilated to its insertion on the urinary bladder. Left kidney demonstrates mild hydronephrosis and mild  dilation of the left ureter, new from the comparison. No left-sided nephrolithiasis. No perinephric stranding at the left kidney. The urinary bladder is somewhat distended, reaching the level of the umbilicus. Asymmetric appearance of the left lower urinary bladder, near the insertion of the left ureter. Given that the left ureter is somewhat anterior, this may be a surgical reimplantation after the prior hysterectomy. This configuration is similar to the comparison CT. No radiopaque stones within the urinary bladder. Stomach/Bowel: Hiatal hernia. Unremarkable appearance of stomach. Small bowel nondistended with no transition point. Moderate stool burden. Colonic diverticula without evidence of associated inflammatory changes. No transition point. Vascular/Lymphatic:  Calcifications of the abdominal aorta. No lymphadenopathy. Small lymph nodes of the retroperitoneum. Reproductive: Hysterectomy. Surgical clips at the rectovesical space, at the site of the prior surgery. There is ill-defined soft tissue adjacent to the surgical clips, inseparable from the bladder base. Other: Fat containing umbilical hernia. Musculoskeletal: No acute displaced fracture. IMPRESSION: Right-sided hydronephrosis with perinephric fluid, likely spontaneous decompression, with persistently dilated right ureter. No radiopaque stones identified. The differential includes distal ureteral obstruction secondary to non radiopaque stones, blood/debris, ascending urinary tract infection, as well as obstructing ureterocele in the bladder. Recurrent tumor as a urinary obstruction cannot be excluded given the patient's history. Urology consultation may be useful, with consideration of cystoscopy. Mild left-sided hydronephrosis with dilated left ureter. This may be chronic, with the left ureteral insertion somewhat anterior on the urinary bladder than expected, and I would favor this to represent a surgical reimplantation after the prior hysterectomy for endometrial carcinoma. Correlation with prior surgical record may be useful. Given there are no inflammatory changes or stones, the dilation of the ureter is favored to be a chronic postoperative finding. Correlation with lab values/urinalysis may be useful if there is concern for infection. Diverticular disease without evidence of acute diverticulitis. Hysterectomy These results were called by telephone at the time of interpretation on 07/13/2017 at 4:38 pm to Dr. Nicholes Mango , who verbally acknowledged these results. Electronically Signed   By: Corrie Mckusick D.O.   On: 07/13/2017 16:39   Dg Abd 1 View  Result Date: 07/12/2017 CLINICAL DATA:  Abdominal pain EXAM: ABDOMEN - 1 VIEW COMPARISON:  None. FINDINGS: Normal bowel gas pattern. Moderate amount of stool in  the colon. No renal calculi. No focal bony lesion. Surgical clips in the pelvis. IMPRESSION: Moderate stool in the colon.  Negative for bowel obstruction. Electronically Signed   By: Franchot Gallo M.D.   On: 07/12/2017 16:46    Impression:  62 year old female with moderate right hydronephrosis and mild left hydronephrosis in association with urinary retention.  This has previously been documented on CT is associated with urinary retention and resolution with bladder decompression.  The retroperitoneal fluid is most likely secondary to a forniceal rupture with urine extravasation.  Recommendation:  A Foley catheter order was placed.  Would recommend Foley drainage for 1 week.  Would recommend follow-up renal ultrasound prior to catheter removal to document resolution of her hydronephrosis.  If her symptoms do not resolve with bladder decompression would repeat the CT or get a renal ultrasound and 24-48 hours.  No specific treatment is required for her forniceal rupture.  07/13/2017, 7:29 PM  John Giovanni,  MD   Thank you for involving me in this patient's care,  I will continue to follow along.  Please page with any further questions or concerns.    [place]

## 2017-07-13 NOTE — Progress Notes (Signed)
Subjective:  Patient reports pain as moderate.  Patient reports intermittent severe right sided abdominal pain. Hospitalist has been consulted.   Objective:   VITALS:   Vitals:   07/12/17 2205 07/13/17 0753 07/13/17 0921 07/13/17 1653  BP: 140/84 111/65  (!) 143/67  Pulse: 69 72 (!) 110 76  Resp: 19 18  18   Temp: 97.8 F (36.6 C) 97.9 F (36.6 C)  98.6 F (37 C)  TempSrc: Oral Oral  Oral  SpO2: 97% 94% 96% 93%  Weight:      Height:        PHYSICAL EXAM:  Sensation intact distally Dorsiflexion/Plantar flexion intact Incision: dressing C/D/I No cellulitis present Compartment soft no rebound pain, positive bowel sounds  LABS  No results found for this or any previous visit (from the past 24 hour(s)).  Ct Abdomen Pelvis Wo Contrast  Result Date: 07/13/2017 CLINICAL DATA:  62 year old female with a history of right-sided abdominal pain radiating to the suprapubic region. Patient has a history of endometrial carcinoma. EXAM: CT ABDOMEN AND PELVIS WITHOUT CONTRAST TECHNIQUE: Multidetector CT imaging of the abdomen and pelvis was performed following the standard protocol without IV contrast. COMPARISON:  CT 11/21/2016 FINDINGS: Lower chest: No acute abnormality. Hepatobiliary: Unremarkable appearance of liver.  Cholecystectomy Pancreas: Unremarkable appearance of the pancreas Spleen: Unremarkable spleen Adrenals/Urinary Tract: Unremarkable adrenal glands. Right kidney demonstrates hydronephrosis with perinephric stranding with fluid in the perinephric space and extending inferiorly into the pelvis along the ureter. Fluid adjacent to the duodenum within the retroperitoneal space. The length of the right ureter is dilated with no radiopaque stones. Ureter is dilated to its insertion on the urinary bladder. Left kidney demonstrates mild hydronephrosis and mild dilation of the left ureter, new from the comparison. No left-sided nephrolithiasis. No perinephric stranding at the left kidney.  The urinary bladder is somewhat distended, reaching the level of the umbilicus. Asymmetric appearance of the left lower urinary bladder, near the insertion of the left ureter. Given that the left ureter is somewhat anterior, this may be a surgical reimplantation after the prior hysterectomy. This configuration is similar to the comparison CT. No radiopaque stones within the urinary bladder. Stomach/Bowel: Hiatal hernia. Unremarkable appearance of stomach. Small bowel nondistended with no transition point. Moderate stool burden. Colonic diverticula without evidence of associated inflammatory changes. No transition point. Vascular/Lymphatic: Calcifications of the abdominal aorta. No lymphadenopathy. Small lymph nodes of the retroperitoneum. Reproductive: Hysterectomy. Surgical clips at the rectovesical space, at the site of the prior surgery. There is ill-defined soft tissue adjacent to the surgical clips, inseparable from the bladder base. Other: Fat containing umbilical hernia. Musculoskeletal: No acute displaced fracture. IMPRESSION: Right-sided hydronephrosis with perinephric fluid, likely spontaneous decompression, with persistently dilated right ureter. No radiopaque stones identified. The differential includes distal ureteral obstruction secondary to non radiopaque stones, blood/debris, ascending urinary tract infection, as well as obstructing ureterocele in the bladder. Recurrent tumor as a urinary obstruction cannot be excluded given the patient's history. Urology consultation may be useful, with consideration of cystoscopy. Mild left-sided hydronephrosis with dilated left ureter. This may be chronic, with the left ureteral insertion somewhat anterior on the urinary bladder than expected, and I would favor this to represent a surgical reimplantation after the prior hysterectomy for endometrial carcinoma. Correlation with prior surgical record may be useful. Given there are no inflammatory changes or stones,  the dilation of the ureter is favored to be a chronic postoperative finding. Correlation with lab values/urinalysis may be useful if there is concern for  infection. Diverticular disease without evidence of acute diverticulitis. Hysterectomy These results were called by telephone at the time of interpretation on 07/13/2017 at 4:38 pm to Dr. Nicholes Mango , who verbally acknowledged these results. Electronically Signed   By: Corrie Mckusick D.O.   On: 07/13/2017 16:39   Dg Abd 1 View  Result Date: 07/12/2017 CLINICAL DATA:  Abdominal pain EXAM: ABDOMEN - 1 VIEW COMPARISON:  None. FINDINGS: Normal bowel gas pattern. Moderate amount of stool in the colon. No renal calculi. No focal bony lesion. Surgical clips in the pelvis. IMPRESSION: Moderate stool in the colon.  Negative for bowel obstruction. Electronically Signed   By: Franchot Gallo M.D.   On: 07/12/2017 16:46    Assessment/Plan: 4 Days Post-Op   Active Problems:   Osteoarthritis of right knee   Up with therapy Discharge to SNF pending evaluation with hospitalist.  * CT shows evidence of hydronephrosis. Urgent Urology consult. Will hold discharge pending evaluation with Urology.   Lovell Sheehan , MD 07/13/2017, 6:17 PM

## 2017-07-13 NOTE — Clinical Social Work Placement (Signed)
   CLINICAL SOCIAL WORK PLACEMENT  NOTE  Date:  07/13/2017  Patient Details  Name: Jean Gonzales MRN: [id number] Date of Birth: 1955/09/14  Clinical Social Work is seeking post-discharge placement for this patient at the [place] level of care (*CSW will initial, date and re-position this form in  chart as items are completed):  Yes   Patient/family provided with [facility] Work Department's list of facilities offering this level of care within the geographic area requested by the patient (or if unable, by the patient's family).  Yes   Patient/family informed of their freedom to choose among providers that offer the needed level of care, that participate in Medicare, Medicaid or managed care program needed by the patient, have an available bed and are willing to accept the patient.  Yes   Patient/family informed of [facility] ownership interest in [facility] and [facility], as well as of the fact that they are under no obligation to receive care at these facilities.  PASRR submitted to EDS on 07/10/17     PASRR number received on 07/10/17     Existing PASRR number confirmed on       FL2 transmitted to all facilities in geographic area requested by pt/family on 07/10/17     FL2 transmitted to all facilities within larger geographic area on       Patient informed that his/her managed care company has contracts with or will negotiate with certain facilities, including the following:        Yes   Patient/family informed of bed offers received.  Patient chooses bed at [facility]     Physician recommends and patient chooses bed at      Patient to be transferred to [facility] on 07/13/17.  Patient to be transferred to facility by [facility] )     Patient family notified on 07/13/17 of transfer.  Name of family member notified:  (Patient's fiance is at bedside and aware of D/C today. )      PHYSICIAN       Additional Comment:    _______________________________________________ [name], Veronia Beets, LCSW 07/13/2017, 3:25 PM

## 2017-07-14 LAB — BASIC METABOLIC PANEL
ANION GAP: 4 — AB (ref 5–15)
BUN: 14 mg/dL (ref 6–20)
CHLORIDE: 100 mmol/L — AB (ref 101–111)
CO2: 32 mmol/L (ref 22–32)
Calcium: 8.7 mg/dL — ABNORMAL LOW (ref 8.9–10.3)
Creatinine, Ser: 0.94 mg/dL (ref 0.44–1.00)
Glucose, Bld: 142 mg/dL — ABNORMAL HIGH (ref 65–99)
POTASSIUM: 4 mmol/L (ref 3.5–5.1)
SODIUM: 136 mmol/L (ref 135–145)

## 2017-07-14 LAB — CBC
HCT: 33.6 % — ABNORMAL LOW (ref 35.0–47.0)
HEMOGLOBIN: 11.4 g/dL — AB (ref 12.0–16.0)
MCH: 29.9 pg (ref 26.0–34.0)
MCHC: 33.8 g/dL (ref 32.0–36.0)
MCV: 88.4 fL (ref 80.0–100.0)
PLATELETS: 255 10*3/uL (ref 150–440)
RBC: 3.8 MIL/uL (ref 3.80–5.20)
RDW: 13.5 % (ref 11.5–14.5)
WBC: 9.7 10*3/uL (ref 3.6–11.0)

## 2017-07-14 MED ORDER — ESOMEPRAZOLE MAGNESIUM 20 MG PO CPDR
40.0000 mg | DELAYED_RELEASE_CAPSULE | Freq: Every day | ORAL | Status: DC
  Administered 2017-07-14 – 2017-07-16 (×3): 40 mg via ORAL
  Filled 2017-07-14 (×2): qty 2

## 2017-07-14 MED ORDER — NON FORMULARY: 40.0000 mg | Freq: Every morning | Status: DC

## 2017-07-14 NOTE — Progress Notes (Signed)
Subjective:  Patient reports pain as moderate.  Foley in place.  Objective:   VITALS:   Vitals:   07/13/17 0921 07/13/17 1653 07/14/17 0015 07/14/17 0751  BP:  (!) 143/67 116/82 113/63  Pulse: (!) 110 76 98 78  Resp:  18 18 18   Temp:  98.6 F (37 C) 98 F (36.7 C) 98.7 F (37.1 C)  TempSrc:  Oral Oral Oral  SpO2: 96% 93% 97% 90%  Weight:      Height:        PHYSICAL EXAM:  Sensation intact distally Intact pulses distally Dorsiflexion/Plantar flexion intact Incision: dressing C/D/I Compartment soft  LABS  Results for orders placed or performed during the hospital encounter of 07/09/17 (from the past 24 hour(s))  CBC     Status: Abnormal   Collection Time: 07/14/17  8:55 AM  Result Value Ref Range   WBC 9.7 3.6 - 11.0 K/uL   RBC 3.80 3.80 - 5.20 MIL/uL   Hemoglobin 11.4 (L) 12.0 - 16.0 g/dL   HCT 33.6 (L) 35.0 - 47.0 %   MCV 88.4 80.0 - 100.0 fL   MCH 29.9 26.0 - 34.0 pg   MCHC 33.8 32.0 - 36.0 g/dL   RDW 13.5 11.5 - 14.5 %   Platelets 255 150 - 440 K/uL  Basic metabolic panel     Status: Abnormal   Collection Time: 07/14/17  8:55 AM  Result Value Ref Range   Sodium 136 135 - 145 mmol/L   Potassium 4.0 3.5 - 5.1 mmol/L   Chloride 100 (L) 101 - 111 mmol/L   CO2 32 22 - 32 mmol/L   Glucose, Bld 142 (H) 65 - 99 mg/dL   BUN 14 6 - 20 mg/dL   Creatinine, Ser 0.94 0.44 - 1.00 mg/dL   Calcium 8.7 (L) 8.9 - 10.3 mg/dL   GFR calc non Af Amer >60 >60 mL/min   GFR calc Af Amer >60 >60 mL/min   Anion gap 4 (L) 5 - 15    Ct Abdomen Pelvis Wo Contrast  Result Date: 07/13/2017 CLINICAL DATA:  62 year old female with a history of right-sided abdominal pain radiating to the suprapubic region. Patient has a history of endometrial carcinoma. EXAM: CT ABDOMEN AND PELVIS WITHOUT CONTRAST TECHNIQUE: Multidetector CT imaging of the abdomen and pelvis was performed following the standard protocol without IV contrast. COMPARISON:  CT 11/21/2016 FINDINGS: Lower chest: No acute  abnormality. Hepatobiliary: Unremarkable appearance of liver.  Cholecystectomy Pancreas: Unremarkable appearance of the pancreas Spleen: Unremarkable spleen Adrenals/Urinary Tract: Unremarkable adrenal glands. Right kidney demonstrates hydronephrosis with perinephric stranding with fluid in the perinephric space and extending inferiorly into the pelvis along the ureter. Fluid adjacent to the duodenum within the retroperitoneal space. The length of the right ureter is dilated with no radiopaque stones. Ureter is dilated to its insertion on the urinary bladder. Left kidney demonstrates mild hydronephrosis and mild dilation of the left ureter, new from the comparison. No left-sided nephrolithiasis. No perinephric stranding at the left kidney. The urinary bladder is somewhat distended, reaching the level of the umbilicus. Asymmetric appearance of the left lower urinary bladder, near the insertion of the left ureter. Given that the left ureter is somewhat anterior, this may be a surgical reimplantation after the prior hysterectomy. This configuration is similar to the comparison CT. No radiopaque stones within the urinary bladder. Stomach/Bowel: Hiatal hernia. Unremarkable appearance of stomach. Small bowel nondistended with no transition point. Moderate stool burden. Colonic diverticula without evidence of associated inflammatory changes.  No transition point. Vascular/Lymphatic: Calcifications of the abdominal aorta. No lymphadenopathy. Small lymph nodes of the retroperitoneum. Reproductive: Hysterectomy. Surgical clips at the rectovesical space, at the site of the prior surgery. There is ill-defined soft tissue adjacent to the surgical clips, inseparable from the bladder base. Other: Fat containing umbilical hernia. Musculoskeletal: No acute displaced fracture. IMPRESSION: Right-sided hydronephrosis with perinephric fluid, likely spontaneous decompression, with persistently dilated right ureter. No radiopaque stones  identified. The differential includes distal ureteral obstruction secondary to non radiopaque stones, blood/debris, ascending urinary tract infection, as well as obstructing ureterocele in the bladder. Recurrent tumor as a urinary obstruction cannot be excluded given the patient's history. Urology consultation may be useful, with consideration of cystoscopy. Mild left-sided hydronephrosis with dilated left ureter. This may be chronic, with the left ureteral insertion somewhat anterior on the urinary bladder than expected, and I would favor this to represent a surgical reimplantation after the prior hysterectomy for endometrial carcinoma. Correlation with prior surgical record may be useful. Given there are no inflammatory changes or stones, the dilation of the ureter is favored to be a chronic postoperative finding. Correlation with lab values/urinalysis may be useful if there is concern for infection. Diverticular disease without evidence of acute diverticulitis. Hysterectomy These results were called by telephone at the time of interpretation on 07/13/2017 at 4:38 pm to Dr. Nicholes Mango , who verbally acknowledged these results. Electronically Signed   By: Corrie Mckusick D.O.   On: 07/13/2017 16:39   Dg Abd 1 View  Result Date: 07/12/2017 CLINICAL DATA:  Abdominal pain EXAM: ABDOMEN - 1 VIEW COMPARISON:  None. FINDINGS: Normal bowel gas pattern. Moderate amount of stool in the colon. No renal calculi. No focal bony lesion. Surgical clips in the pelvis. IMPRESSION: Moderate stool in the colon.  Negative for bowel obstruction. Electronically Signed   By: Franchot Gallo M.D.   On: 07/12/2017 16:46    Assessment/Plan: 5 Days Post-Op   Active Problems:   Osteoarthritis of right knee   Up with therapy Continue foley due to acute urinary retention with hydronephrosis, followed by Urology Discharge to SNF once abdominal pain resolves and cleared by Urology, likely Monday   Lovell Sheehan , MD 07/14/2017,  10:28 AM

## 2017-07-14 NOTE — Progress Notes (Signed)
Urology  Feeling much better after catheter placement.  She states her abdominal pain significantly improved.  She complains of "mild soreness".  VSS, afebrile  Exam: Foley catheter draining clear urine   Impression:  Abdominal pain secondary to urinary retention almost resolved with Foley catheter placement.  Her hydronephrosis also most likely secondary to the retention based on past history.  Recommendation: -She states the tentative plan is discharge Monday for rehabilitation.  Would recommend a renal ultrasound at the end of next week and catheter removal after the ultrasound is performed.  -If a bladder scan is available at her rehab facility would recommend obtaining PVR by bladder scan.  If not available can arrange office follow-up to have this performed.

## 2017-07-14 NOTE — Progress Notes (Signed)
[facility] [place] NAME: Jean Gonzales    MR#:  [id number]  DATE OF BIRTH:  1956/01/18  SUBJECTIVE:  CHIEF COMPLAINT: Patient is reporting right-sided abdominal pain is better after Foley catheter placement, started working with physical therapy  REVIEW OF SYSTEMS:  CONSTITUTIONAL: No fever, fatigue or weakness.  EYES: No blurred or double vision.  EARS, NOSE, AND THROAT: No tinnitus or ear pain.  RESPIRATORY: No cough, shortness of breath, wheezing or hemoptysis.  CARDIOVASCULAR: No chest pain, orthopnea, edema.  GASTROINTESTINAL: No nausea, vomiting, diarrhea . patient is reporting right-sided abdominal pain and distention GENITOURINARY: No dysuria, hematuria.  ENDOCRINE: No polyuria, nocturia,  HEMATOLOGY: No anemia, easy bruising or bleeding SKIN: No rash or lesion. MUSCULOSKELETAL: No joint pain or arthritis.   NEUROLOGIC: No tingling, numbness, weakness.  PSYCHIATRY: No anxiety or depression.   DRUG ALLERGIES:   Allergies  Allergen Reactions  . Iodinated Diagnostic Agents Anaphylaxis  . Iodine Swelling    VITALS:  Blood pressure 113/63, pulse 78, temperature 98.7 F (37.1 C), temperature source Oral, resp. rate 18, height 5\' 8"  (1.727 m), weight 110.2 kg (243 lb), SpO2 90 %.  PHYSICAL EXAMINATION:  GENERAL:  62 y.o.-year-old patient lying in the bed with no acute distress.  EYES: Pupils equal, round, reactive to light and accommodation. No scleral icterus. Extraocular muscles intact.  HEENT: Head atraumatic, normocephalic. Oropharynx and nasopharynx clear.  NECK:  Supple, no jugular venous distention. No thyroid enlargement, no tenderness.  LUNGS: Normal breath sounds bilaterally, no wheezing, rales,rhonchi or crepitation. No use of accessory muscles of respiration.  CARDIOVASCULAR: S1, S2 normal. No murmurs, rubs, or gallops.  ABDOMEN: Soft, right lower quadrant is with minimal tenderness  but no rebound  tenderness, distended. Bowel sounds present.   EXTREMITIES: No pedal edema, cyanosis, or clubbing.  NEUROLOGIC: Cranial nerves II through XII are intact. Muscle strength 5/5 in all extremities. Sensation intact. Gait not checked.  PSYCHIATRIC: The patient is alert and oriented x 3.  SKIN: No obvious rash, lesion, or ulcer.    LABORATORY PANEL:   CBC Recent Labs  Lab 07/14/17 0855  WBC 9.7  HGB 11.4*  HCT 33.6*  PLT 255   ------------------------------------------------------------------------------------------------------------------  Chemistries  Recent Labs  Lab 07/14/17 0855  NA 136  K 4.0  CL 100*  CO2 32  GLUCOSE 142*  BUN 14  CREATININE 0.94  CALCIUM 8.7*   ------------------------------------------------------------------------------------------------------------------  Cardiac Enzymes No results for input(s): TROPONINI in the last 168 hours. ------------------------------------------------------------------------------------------------------------------  RADIOLOGY:  Ct Abdomen Pelvis Wo Contrast  Result Date: 07/13/2017 CLINICAL DATA:  62 year old female with a history of right-sided abdominal pain radiating to the suprapubic region. Patient has a history of endometrial carcinoma. EXAM: CT ABDOMEN AND PELVIS WITHOUT CONTRAST TECHNIQUE: Multidetector CT imaging of the abdomen and pelvis was performed following the standard protocol without IV contrast. COMPARISON:  CT 11/21/2016 FINDINGS: Lower chest: No acute abnormality. Hepatobiliary: Unremarkable appearance of liver.  Cholecystectomy Pancreas: Unremarkable appearance of the pancreas Spleen: Unremarkable spleen Adrenals/Urinary Tract: Unremarkable adrenal glands. Right kidney demonstrates hydronephrosis with perinephric stranding with fluid in the perinephric space and extending inferiorly into the pelvis along the ureter. Fluid adjacent to the duodenum within the retroperitoneal space. The length of the right  ureter is dilated with no radiopaque stones. Ureter is dilated to its insertion on the urinary bladder. Left kidney demonstrates mild hydronephrosis and mild dilation of the left ureter, new from the comparison. No left-sided nephrolithiasis. No perinephric  stranding at the left kidney. The urinary bladder is somewhat distended, reaching the level of the umbilicus. Asymmetric appearance of the left lower urinary bladder, near the insertion of the left ureter. Given that the left ureter is somewhat anterior, this may be a surgical reimplantation after the prior hysterectomy. This configuration is similar to the comparison CT. No radiopaque stones within the urinary bladder. Stomach/Bowel: Hiatal hernia. Unremarkable appearance of stomach. Small bowel nondistended with no transition point. Moderate stool burden. Colonic diverticula without evidence of associated inflammatory changes. No transition point. Vascular/Lymphatic: Calcifications of the abdominal aorta. No lymphadenopathy. Small lymph nodes of the retroperitoneum. Reproductive: Hysterectomy. Surgical clips at the rectovesical space, at the site of the prior surgery. There is ill-defined soft tissue adjacent to the surgical clips, inseparable from the bladder base. Other: Fat containing umbilical hernia. Musculoskeletal: No acute displaced fracture. IMPRESSION: Right-sided hydronephrosis with perinephric fluid, likely spontaneous decompression, with persistently dilated right ureter. No radiopaque stones identified. The differential includes distal ureteral obstruction secondary to non radiopaque stones, blood/debris, ascending urinary tract infection, as well as obstructing ureterocele in the bladder. Recurrent tumor as a urinary obstruction cannot be excluded given the patient's history. Urology consultation may be useful, with consideration of cystoscopy. Mild left-sided hydronephrosis with dilated left ureter. This may be chronic, with the left ureteral  insertion somewhat anterior on the urinary bladder than expected, and I would favor this to represent a surgical reimplantation after the prior hysterectomy for endometrial carcinoma. Correlation with prior surgical record may be useful. Given there are no inflammatory changes or stones, the dilation of the ureter is favored to be a chronic postoperative finding. Correlation with lab values/urinalysis may be useful if there is concern for infection. Diverticular disease without evidence of acute diverticulitis. Hysterectomy These results were called by telephone at the time of interpretation on 07/13/2017 at 4:38 pm to Dr. Nicholes Mango , who verbally acknowledged these results. Electronically Signed   By: Corrie Mckusick D.O.   On: 07/13/2017 16:39   Dg Abd 1 View  Result Date: 07/12/2017 CLINICAL DATA:  Abdominal pain EXAM: ABDOMEN - 1 VIEW COMPARISON:  None. FINDINGS: Normal bowel gas pattern. Moderate amount of stool in the colon. No renal calculi. No focal bony lesion. Surgical clips in the pelvis. IMPRESSION: Moderate stool in the colon.  Negative for bowel obstruction. Electronically Signed   By: Franchot Gallo M.D.   On: 07/12/2017 16:46    EKG:   Orders placed or performed during the hospital encounter of 07/09/17  . EKG 12-Lead  . EKG 12-Lead    ASSESSMENT AND PLAN:    Breigh Annett  is a 62 y.o. female with a known history of hypertension, diabetes, arthritis and history of endometrial cancer in remission admitted to hospital for an elective right knee arthroplasty. Medical consult requested for abdominal pain  1.  Acute right lower quadrant Abdominal pain-urinary retention ,right-sided moderate hydronephrosis and right hydroureter mild left hydronephrosis and hydroureter -foley cath in place, monitor urine output closely  -appreciate urology recommendations not considering cystoscopy at this time as patient is feeling much better and abdominal pain significantly improved -Urology  recommending renal ultrasound at the end of next week and catheter removal after the ultrasound is performed -If a bladder scan is available at her rehab facility would recommend obtaining PVR by bladder scan.  If not available urology can arrange office follow-up to have this performed --Out of bed to chair and activity as tolerated as per PT -Mylanta and pain  medications as needed - on liquid diet and advance as tolerated   2.  Right total knee arthroplasty- -Continue pain control by Ortho. -Discharge to rehab when able to  3. Endometrial cancer-status post chemoradiation.   Follows up with GYN oncologist.  Premarin vaginal cream ordered.  4.  Hyperlipidemia-continue statin  5.  GERD-currently on Protonix  6.  DVT prophylaxis- recommend lovenox      All the records are reviewed and case discussed with Care Management/Social Workerr. Management plans discussed with the patient, family and they are in agreement.  CODE STATUS: fc   TOTAL TIME TAKING CARE OF THIS PATIENT: 34 minutes.     Note: This dictation was prepared with Dragon dictation along with smaller phrase technology. Any transcriptional errors that result from this process are unintentional.   Nicholes Mango M.D on 07/14/2017 at 2:26 PM  Between 7am to 6pm - Pager - [phone] After 6pm go to [email or web address] - password [email or web address] [facility]  Office  [phone]  CC: Primary care physician; Mar Daring, PA-C

## 2017-07-14 NOTE — Progress Notes (Signed)
Physical Therapy Treatment Patient Details Name: Jean Gonzales MRN: [id number] DOB: [birth year] Today's Date: 07/14/2017    History of Present Illness Pt is a 62 yo F diagnosed with osteoarthritis of the R knee and is s/p elective R TKA.  PMH includes: HTN, DM II, GERD, endometrial CA, and HLD.     PT Comments    Jean Gonzales is making good progress with ambulatory distance, ambulating 85 ft with RW without rest break this session.  Her R knee was more painful today compared to yesterday, limiting her ROM this session, although pt still able to achieve 82 deg flexion.  Pt requires min assist for transfers due to weakness.  SNF remains most appropriate d/c plan at this time.     Follow Up Recommendations  SNF     Equipment Recommendations  Other (comment)(TBD at next venue of care)    Recommendations for Other Services       Precautions / Restrictions Precautions Precautions: Knee;Fall Precaution Comments: Reviewed no pillow under knee Restrictions Weight Bearing Restrictions: Yes RLE Weight Bearing: Weight bearing as tolerated    Mobility  Bed Mobility Overal bed mobility: Needs Assistance Bed Mobility: Supine to Sit     Supine to sit: Supervision;HOB elevated Sit to supine: Supervision;HOB elevated   General bed mobility comments: Pt able to perform R hip adduction and slide RLE to EOB without leg hook technique this session.  Pt does use leg hook technique to bring RLE off side of bed into sitting.   Transfers Overall transfer level: Needs assistance Equipment used: Rolling walker (2 wheeled) Transfers: Sit to/from Stand Sit to Stand: Min assist         General transfer comment: Light min assist to standing from bed with proper hand placement.  Pt requires min assist to boost to stand from chair with pt incorrectly reaching for anterior frame of RW to stand.   Ambulation/Gait Ambulation/Gait assistance: Min assist Ambulation Distance (Feet): 85  Feet Assistive device: Rolling walker (2 wheeled) Gait Pattern/deviations: Step-to pattern;Decreased stance time - right;Decreased step length - left;Decreased weight shift to right;Trunk flexed Gait velocity: decreased   General Gait Details: Pt WBing heavily through BUEs on RW and provided cues to relax shoulders.  Cues for upright posture and forward gaze.  Pt demonstrates fatigue with increasingly decreased stance time RLE toward end of ambulation.    Stairs             Wheelchair Mobility    Modified Rankin (Stroke Patients Only)       Balance Overall balance assessment: Needs assistance Sitting-balance support: No upper extremity supported;Feet supported Sitting balance-Leahy Scale: Good     Standing balance support: Single extremity supported;During functional activity Standing balance-Leahy Scale: Poor Standing balance comment: Pt relies on at least 1UE support in standing activities                            Cognition Arousal/Alertness: Awake/alert Behavior During Therapy: WFL for tasks assessed/performed Overall Cognitive Status: Within Functional Limits for tasks assessed                                        Exercises Total Joint Exercises Ankle Circles/Pumps: AROM;Both;10 reps;Supine Quad Sets: Strengthening;Both;10 reps;Supine Straight Leg Raises: AAROM;Right;10 reps;Supine Knee Flexion: AAROM;Right;Other reps (comment);Other (comment)(3 reps with 5 second holds) Goniometric ROM: -8 to 82  deg    General Comments        Pertinent Vitals/Pain Pain Assessment: 0-10 Pain Score: 4  Pain Location: R knee Pain Descriptors / Indicators: Aching;Sore;Operative site guarding;Tightness Pain Intervention(s): Limited activity within patient's tolerance;Monitored during session;Repositioned;Patient requesting pain meds-RN notified    Home Living                      Prior Function            PT Goals (current  goals can now be found in the care plan section) Acute Rehab PT Goals Patient Stated Goal: to improve strength and independence PT Goal Formulation: With patient Time For Goal Achievement: 07/22/17 Potential to Achieve Goals: Good Progress towards PT goals: Progressing toward goals    Frequency    BID      PT Plan Current plan remains appropriate    Co-evaluation              AM-PAC PT "6 Clicks" Daily Activity  Outcome Measure  Difficulty turning over in bed (including adjusting bedclothes, sheets and blankets)?: A Lot Difficulty moving from lying on back to sitting on the side of the bed? : A Lot Difficulty sitting down on and standing up from a chair with arms (e.g., wheelchair, bedside commode, etc,.)?: Unable Help needed moving to and from a bed to chair (including a wheelchair)?: A Little Help needed walking in hospital room?: A Little Help needed climbing 3-5 steps with a railing? : A Lot 6 Click Score: 13    End of Session Equipment Utilized During Treatment: Gait belt Activity Tolerance: Patient tolerated treatment well Patient left: Other (comment)(on BSC with call bell; pt requests time for BM) Nurse Communication: Mobility status;Other (comment)(pt on BSC with call bell) PT Visit Diagnosis: Muscle weakness (generalized) (M62.81);Other abnormalities of gait and mobility (R26.89);Pain;Difficulty in walking, not elsewhere classified (R26.2) Pain - Right/Left: Right Pain - part of body: Knee     Time: 1356-1436 PT Time Calculation (min) (ACUTE ONLY): 40 min  Charges:  $Gait Training: 8-22 mins $Therapeutic Exercise: 8-22 mins $Therapeutic Activity: 8-22 mins                    G Codes:       Collie Siad PT, DPT 07/14/2017, 2:43 PM

## 2017-07-14 NOTE — Clinical Social Work Note (Signed)
The CSW has updated [facility] that according to the attending RN and the consulting Hospitalist, the patient will remain until Monday due to the recommendation from urology. The CSW has updated the FL 2 to reflect the addition of a foley catheter. The CSW is following.  Santiago Bumpers, MSW, Latanya Presser [phone]

## 2017-07-14 NOTE — Progress Notes (Signed)
Physical Therapy Treatment Patient Details Name: Jean Gonzales MRN: [id number] DOB: [birth year] Today's Date: 07/14/2017    History of Present Illness Pt is a 62 yo F diagnosed with osteoarthritis of the R knee and is s/p elective R TKA.  PMH includes: HTN, DM II, GERD, endometrial CA, and HLD.      PT Comments    Pt in bed.  Participated in exercises as described below.  To edge of bed with supervision.  Hooks feet to assist RLE off bed.  Stood with min assist and verbal cues for hand placements.  She tries to pull up on walker.  Standing exercises pre-gait.  Pt reports feeling generally weaker today but was able to walk with min guard and recliner follow 40'.  He sat primarily due to fatigue and feeling "jittery" which she attributes to medication.  Declined further gait at this time.  Transferred to commode at end of session and call bell in place.  Nurse tech aware.  ROM limited this session due to pain and fatigue.  Will do formal measurements this pm.   Follow Up Recommendations  SNF     Equipment Recommendations       Recommendations for Other Services       Precautions / Restrictions Precautions Precautions: Knee;Fall Restrictions Weight Bearing Restrictions: Yes RLE Weight Bearing: Weight bearing as tolerated Other Position/Activity Restrictions: R TKR    Mobility  Bed Mobility Overal bed mobility: Modified Independent Bed Mobility: Supine to Sit     Supine to sit: Supervision;HOB elevated     General bed mobility comments: hooks feet to self assit LE off EOB  Transfers Overall transfer level: Needs assistance Equipment used: Rolling walker (2 wheeled) Transfers: Sit to/from Stand Sit to Stand: Min assist            Ambulation/Gait Ambulation/Gait assistance: Museum/gallery curator (Feet): 40 Feet Assistive device: Rolling walker (2 wheeled) Gait Pattern/deviations: Step-to pattern Gait velocity: decreased   General Gait Details:  limited gait this am.  general fatigue and feeling "jittery" due to meds   Stairs             Wheelchair Mobility    Modified Rankin (Stroke Patients Only)       Balance Overall balance assessment: Needs assistance Sitting-balance support: Feet supported;No upper extremity supported Sitting balance-Leahy Scale: Good     Standing balance support: Bilateral upper extremity supported Standing balance-Leahy Scale: Fair Standing balance comment: required RW CGA                            Cognition Arousal/Alertness: Awake/alert Behavior During Therapy: WFL for tasks assessed/performed Overall Cognitive Status: Within Functional Limits for tasks assessed                                        Exercises Total Joint Exercises Ankle Circles/Pumps: AROM;Strengthening;Both;10 reps;Supine Quad Sets: AROM;Strengthening;Right;5 reps;Supine Heel Slides: AAROM;Strengthening;Right;5 reps;Supine Hip ABduction/ADduction: AAROM;Strengthening;Right;5 reps;Supine Straight Leg Raises: AAROM;Strengthening;Right;5 reps;Supine Long Arc Quad: AROM;Right;10 reps;15 reps Goniometric ROM: limited knee flexion this session due to pain.  will do formal measurements in PM as she was self limiting at 72 degerees. Marching in Standing: AROM;5 reps;Right;Standing    General Comments        Pertinent Vitals/Pain Pain Assessment: 0-10 Pain Score: 5  Pain Location: R knee Pain Descriptors / Indicators: Aching;Sore;Operative  site guarding Pain Intervention(s): Limited activity within patient's tolerance;Monitored during session;Premedicated before session;Ice applied;Repositioned    Home Living                      Prior Function            PT Goals (current goals can now be found in the care plan section) Progress towards PT goals: Progressing toward goals    Frequency    BID      PT Plan Current plan remains appropriate    Co-evaluation               AM-PAC PT "6 Clicks" Daily Activity  Outcome Measure  Difficulty turning over in bed (including adjusting bedclothes, sheets and blankets)?: A Little Difficulty moving from lying on back to sitting on the side of the bed? : A Little Difficulty sitting down on and standing up from a chair with arms (e.g., wheelchair, bedside commode, etc,.)?: Unable Help needed moving to and from a bed to chair (including a wheelchair)?: A Little Help needed walking in hospital room?: A Little Help needed climbing 3-5 steps with a railing? : A Lot 6 Click Score: 15    End of Session Equipment Utilized During Treatment: Gait belt Activity Tolerance: Patient limited by pain;Patient limited by fatigue Patient left: Other (comment);with call bell/phone within reach Nurse Communication: Other (comment) Pain - Right/Left: Right Pain - part of body: Knee     Time: 0912-0942 PT Time Calculation (min) (ACUTE ONLY): 30 min  Charges:  $Gait Training: 8-22 mins $Therapeutic Exercise: 8-22 mins                    G Codes:       Chesley Noon, PTA 07/14/17, 10:31 AM

## 2017-07-14 NOTE — Progress Notes (Signed)
Verbal order received by MD Harlow Mares to advance patients diet. Order placed.

## 2017-07-14 NOTE — Progress Notes (Signed)
Family Meeting Note  Advance Directive:yes  Today a meeting took place with the Patient.   The following clinical team members were present during this meeting:MD  The following were discussed:Patient's diagnosis: BL hydronephrosis, rt abd pain, urinary retention, plan of care discussed with pt  , Patient's progosis: Unable to determine and Goals for treatment: Full Code  Sister- diane stewart HCPOA  Additional follow-up to be provided: hospitalist, urology,ortho  Time spent during discussion:17 min  Nicholes Mango, MD

## 2017-07-15 NOTE — Progress Notes (Signed)
Physical Therapy Treatment Patient Details Name: Jean Gonzales MRN: [id number] DOB: [birth year] Today's Date: 07/15/2017    History of Present Illness Pt is a 62 yo F diagnosed with osteoarthritis of the R knee and is s/p elective R TKA.  PMH includes: HTN, DM II, GERD, endometrial CA, and HLD.      PT Comments    Pt's husband stepped out for visit, and pt worked on transfer to [facility], then walked on hallway with alterations to accommodate comfort and tolerance for wb on R LE.  Her plan is still SNF as she cannot walk or get OOB without some amount of assistance.  Progress acutely to shorten need to stay in rehab by increasing ROM R knee and progressing stability with gait.   Follow Up Recommendations  SNF     Equipment Recommendations  None recommended by PT(await rehab setting)    Recommendations for Other Services       Precautions / Restrictions Precautions Precautions: Knee;Fall Precaution Booklet Issued: Yes (comment) Precaution Comments: reviewed use of compression and ice Restrictions Weight Bearing Restrictions: Yes RLE Weight Bearing: Weight bearing as tolerated Other Position/Activity Restrictions: R TKR    Mobility  Bed Mobility Overal bed mobility: Needs Assistance Bed Mobility: Supine to Sit;Sit to Supine     Supine to sit: Min assist Sit to supine: Min assist   General bed mobility comments: used hook technique just at very edge of bed  Transfers Overall transfer level: Needs assistance Equipment used: Rolling walker (2 wheeled) Transfers: Sit to/from Stand Sit to Stand: Min guard         General transfer comment: reinstructed her use of walker to stand  Ambulation/Gait Ambulation/Gait assistance: Min guard Ambulation Distance (Feet): 150 Feet Assistive device: Rolling walker (2 wheeled) Gait Pattern/deviations: Step-through pattern;Step-to pattern;Decreased stride length;Wide base of support Gait velocity: decreased Gait velocity  interpretation: <1.8 ft/sec, indicate of risk for recurrent falls General Gait Details: wide turns on walker to maneuver in hallway   Stairs             Wheelchair Mobility    Modified Rankin (Stroke Patients Only)       Balance Overall balance assessment: Needs assistance Sitting-balance support: Feet supported Sitting balance-Leahy Scale: Good     Standing balance support: Bilateral upper extremity supported Standing balance-Leahy Scale: Fair Standing balance comment: less than fair dynamic balance                            Cognition Arousal/Alertness: Awake/alert Behavior During Therapy: WFL for tasks assessed/performed Overall Cognitive Status: Within Functional Limits for tasks assessed                                        Exercises Total Joint Exercises Ankle Circles/Pumps: AROM;Both;5 reps Long Arc Quad: AROM;AAROM;Both;10 reps Knee Flexion: AAROM;AROM;Both;10 reps Goniometric ROM: -8 to 97 Marching in Standing: 10 reps;AROM;AAROM    General Comments        Pertinent Vitals/Pain Pain Assessment: 0-10 Pain Score: 7  Pain Location: R knee Pain Descriptors / Indicators: Operative site guarding Pain Intervention(s): Limited activity within patient's tolerance;Monitored during session;Premedicated before session;Repositioned;Ice applied    Home Living                      Prior Function  PT Goals (current goals can now be found in the care plan section) Acute Rehab PT Goals Patient Stated Goal: to improve strength and independence Progress towards PT goals: Progressing toward goals    Frequency    BID      PT Plan Current plan remains appropriate    Co-evaluation              AM-PAC PT "6 Clicks" Daily Activity  Outcome Measure  Difficulty turning over in bed (including adjusting bedclothes, sheets and blankets)?: A Little Difficulty moving from lying on back to sitting on the  side of the bed? : A Lot Difficulty sitting down on and standing up from a chair with arms (e.g., wheelchair, bedside commode, etc,.)?: Unable Help needed moving to and from a bed to chair (including a wheelchair)?: A Little Help needed walking in hospital room?: A Little Help needed climbing 3-5 steps with a railing? : A Lot 6 Click Score: 14    End of Session Equipment Utilized During Treatment: Gait belt Activity Tolerance: Patient tolerated treatment well Patient left: with call bell/phone within reach;in bed;with bed alarm set;with family/visitor present Nurse Communication: Mobility status PT Visit Diagnosis: Muscle weakness (generalized) (M62.81);Other abnormalities of gait and mobility (R26.89);Pain;Difficulty in walking, not elsewhere classified (R26.2) Pain - Right/Left: Right Pain - part of body: Knee     Time: 0160-1093 PT Time Calculation (min) (ACUTE ONLY): 35 min  Charges:  $Gait Training: 8-22 mins $Therapeutic Exercise: 8-22 mins                    G Codes:  Functional Assessment Tool Used: AM-PAC 6 Clicks Basic Mobility     Ramond Dial 07/15/2017, 1:37 PM   Mee Hives, PT MS Acute Rehab Dept. Number: [facility] [facility] [place]

## 2017-07-15 NOTE — Progress Notes (Signed)
[facility] NAME: Jean Gonzales    MR#:  [id number]  DATE OF BIRTH:  1955/09/10  SUBJECTIVE:  CHIEF COMPLAINT: 4/10 right-sided abdominal pain , having good urine output  REVIEW OF SYSTEMS:  CONSTITUTIONAL: No fever, fatigue or weakness.  EYES: No blurred or double vision.  EARS, NOSE, AND THROAT: No tinnitus or ear pain.  RESPIRATORY: No cough, shortness of breath, wheezing or hemoptysis.  CARDIOVASCULAR: No chest pain, orthopnea, edema.  GASTROINTESTINAL: No nausea, vomiting, diarrhea . patient is reporting right-sided abdominal pain and distention GENITOURINARY: No dysuria, hematuria.  ENDOCRINE: No polyuria, nocturia,  HEMATOLOGY: No anemia, easy bruising or bleeding SKIN: No rash or lesion. MUSCULOSKELETAL: No joint pain or arthritis.   NEUROLOGIC: No tingling, numbness, weakness.  PSYCHIATRY: No anxiety or depression.   DRUG ALLERGIES:   Allergies  Allergen Reactions  . Iodinated Diagnostic Agents Anaphylaxis  . Iodine Swelling    VITALS:  Blood pressure 129/70, pulse 68, temperature 98 F (36.7 C), temperature source Oral, resp. rate 18, height 5\' 8"  (1.727 m), weight 110.2 kg (243 lb), SpO2 99 %.  PHYSICAL EXAMINATION:  GENERAL:  62 y.o.-year-old patient lying in the bed with no acute distress.  EYES: Pupils equal, round, reactive to light and accommodation. No scleral icterus. Extraocular muscles intact.  HEENT: Head atraumatic, normocephalic. Oropharynx and nasopharynx clear.  NECK:  Supple, no jugular venous distention. No thyroid enlargement, no tenderness.  LUNGS: Normal breath sounds bilaterally, no wheezing, rales,rhonchi or crepitation. No use of accessory muscles of respiration.  CARDIOVASCULAR: S1, S2 normal. No murmurs, rubs, or gallops.  ABDOMEN: Soft, right lower quadrant is with minimal tenderness  but no rebound tenderness, not distended. Bowel sounds present.   EXTREMITIES: No pedal edema,  cyanosis, or clubbing.  NEUROLOGIC: Cranial nerves II through XII are intact. Muscle strength weak in all extremities.  Right knee status post surgery sensation intact. Gait not checked.  PSYCHIATRIC: The patient is alert and oriented x 3.  SKIN: No obvious rash, lesion, or ulcer.    LABORATORY PANEL:   CBC Recent Labs  Lab 07/14/17 0855  WBC 9.7  HGB 11.4*  HCT 33.6*  PLT 255   ------------------------------------------------------------------------------------------------------------------  Chemistries  Recent Labs  Lab 07/14/17 0855  NA 136  K 4.0  CL 100*  CO2 32  GLUCOSE 142*  BUN 14  CREATININE 0.94  CALCIUM 8.7*   ------------------------------------------------------------------------------------------------------------------  Cardiac Enzymes No results for input(s): TROPONINI in the last 168 hours. ------------------------------------------------------------------------------------------------------------------  RADIOLOGY:  Ct Abdomen Pelvis Wo Contrast  Result Date: 07/13/2017 CLINICAL DATA:  62 year old female with a history of right-sided abdominal pain radiating to the suprapubic region. Patient has a history of endometrial carcinoma. EXAM: CT ABDOMEN AND PELVIS WITHOUT CONTRAST TECHNIQUE: Multidetector CT imaging of the abdomen and pelvis was performed following the standard protocol without IV contrast. COMPARISON:  CT 11/21/2016 FINDINGS: Lower chest: No acute abnormality. Hepatobiliary: Unremarkable appearance of liver.  Cholecystectomy Pancreas: Unremarkable appearance of the pancreas Spleen: Unremarkable spleen Adrenals/Urinary Tract: Unremarkable adrenal glands. Right kidney demonstrates hydronephrosis with perinephric stranding with fluid in the perinephric space and extending inferiorly into the pelvis along the ureter. Fluid adjacent to the duodenum within the retroperitoneal space. The length of the right ureter is dilated with no radiopaque stones.  Ureter is dilated to its insertion on the urinary bladder. Left kidney demonstrates mild hydronephrosis and mild dilation of the left ureter, new from the comparison. No left-sided nephrolithiasis. No perinephric stranding  at the left kidney. The urinary bladder is somewhat distended, reaching the level of the umbilicus. Asymmetric appearance of the left lower urinary bladder, near the insertion of the left ureter. Given that the left ureter is somewhat anterior, this may be a surgical reimplantation after the prior hysterectomy. This configuration is similar to the comparison CT. No radiopaque stones within the urinary bladder. Stomach/Bowel: Hiatal hernia. Unremarkable appearance of stomach. Small bowel nondistended with no transition point. Moderate stool burden. Colonic diverticula without evidence of associated inflammatory changes. No transition point. Vascular/Lymphatic: Calcifications of the abdominal aorta. No lymphadenopathy. Small lymph nodes of the retroperitoneum. Reproductive: Hysterectomy. Surgical clips at the rectovesical space, at the site of the prior surgery. There is ill-defined soft tissue adjacent to the surgical clips, inseparable from the bladder base. Other: Fat containing umbilical hernia. Musculoskeletal: No acute displaced fracture. IMPRESSION: Right-sided hydronephrosis with perinephric fluid, likely spontaneous decompression, with persistently dilated right ureter. No radiopaque stones identified. The differential includes distal ureteral obstruction secondary to non radiopaque stones, blood/debris, ascending urinary tract infection, as well as obstructing ureterocele in the bladder. Recurrent tumor as a urinary obstruction cannot be excluded given the patient's history. Urology consultation may be useful, with consideration of cystoscopy. Mild left-sided hydronephrosis with dilated left ureter. This may be chronic, with the left ureteral insertion somewhat anterior on the urinary  bladder than expected, and I would favor this to represent a surgical reimplantation after the prior hysterectomy for endometrial carcinoma. Correlation with prior surgical record may be useful. Given there are no inflammatory changes or stones, the dilation of the ureter is favored to be a chronic postoperative finding. Correlation with lab values/urinalysis may be useful if there is concern for infection. Diverticular disease without evidence of acute diverticulitis. Hysterectomy These results were called by telephone at the time of interpretation on 07/13/2017 at 4:38 pm to Dr. Nicholes Mango , who verbally acknowledged these results. Electronically Signed   By: Corrie Mckusick D.O.   On: 07/13/2017 16:39    EKG:   Orders placed or performed during the hospital encounter of 07/09/17  . EKG 12-Lead  . EKG 12-Lead    ASSESSMENT AND PLAN:    Arlen Legendre  is a 62 y.o. female with a known history of hypertension, diabetes, arthritis and history of endometrial cancer in remission admitted to hospital for an elective right knee arthroplasty. Medical consult requested for abdominal pain  1.  Acute right lower quadrant Abdominal pain-urinary retention ,right-sided moderate hydronephrosis and right hydroureter mild left hydronephrosis and hydroureter -foley cath in place, monitor urine output closely , total output 4900 yesterday -appreciate urology recommendations  -Urology recommending renal ultrasound at the end of next week and catheter removal after the ultrasound is performed -If a bladder scan is available at her rehab facility would recommend obtaining PVR by bladder scan.  If not available urology can arrange office follow-up to have this performed --Out of bed to chair and activity as tolerated as per PT -Mylanta and pain medications as needed - on liquid diet and advance as tolerated   2.  Right total knee arthroplasty- -Continue pain control by Ortho. -Discharge to rehab when able  to -DVT prophylaxis with aspirin 325 mg  3. Endometrial cancer-status post chemoradiation.   Follows up with GYN oncologist.  Premarin vaginal cream ordered.  4.  Hyperlipidemia-continue statin  5.  GERD-currently on Protonix  6.  DVT prophylaxis-SCDs and discontinue Lovenox subcu as patient is started on aspirin 325 mg for DVT prophylaxis  as per Ortho recommendations      All the records are reviewed and case discussed with Care Management/Social Workerr. Management plans discussed with the patient, family and they are in agreement.  CODE STATUS: fc   TOTAL TIME TAKING CARE OF THIS PATIENT: 34 minutes.     Note: This dictation was prepared with Dragon dictation along with smaller phrase technology. Any transcriptional errors that result from this process are unintentional.   Nicholes Mango M.D on 07/15/2017 at 11:13 AM  Between 7am to 6pm - Pager - [phone] After 6pm go to [email or web address] - password [email or web address] [facility]  Office  [phone]  CC: Primary care physician; Mar Daring, PA-C

## 2017-07-16 DIAGNOSIS — Z8679 Personal history of other diseases of the circulatory system: Secondary | ICD-10-CM | POA: Diagnosis not present

## 2017-07-16 DIAGNOSIS — R52 Pain, unspecified: Secondary | ICD-10-CM | POA: Diagnosis not present

## 2017-07-16 DIAGNOSIS — M25561 Pain in right knee: Secondary | ICD-10-CM | POA: Diagnosis not present

## 2017-07-16 DIAGNOSIS — R339 Retention of urine, unspecified: Secondary | ICD-10-CM | POA: Diagnosis not present

## 2017-07-16 DIAGNOSIS — E785 Hyperlipidemia, unspecified: Secondary | ICD-10-CM | POA: Diagnosis not present

## 2017-07-16 DIAGNOSIS — M159 Polyosteoarthritis, unspecified: Secondary | ICD-10-CM | POA: Diagnosis not present

## 2017-07-16 DIAGNOSIS — R69 Illness, unspecified: Secondary | ICD-10-CM | POA: Diagnosis not present

## 2017-07-16 DIAGNOSIS — Z96651 Presence of right artificial knee joint: Secondary | ICD-10-CM | POA: Diagnosis not present

## 2017-07-16 DIAGNOSIS — M6281 Muscle weakness (generalized): Secondary | ICD-10-CM | POA: Diagnosis not present

## 2017-07-16 DIAGNOSIS — Z7401 Bed confinement status: Secondary | ICD-10-CM | POA: Diagnosis not present

## 2017-07-16 DIAGNOSIS — N1339 Other hydronephrosis: Secondary | ICD-10-CM | POA: Diagnosis not present

## 2017-07-16 DIAGNOSIS — Z471 Aftercare following joint replacement surgery: Secondary | ICD-10-CM | POA: Diagnosis not present

## 2017-07-16 DIAGNOSIS — K5900 Constipation, unspecified: Secondary | ICD-10-CM | POA: Diagnosis not present

## 2017-07-16 DIAGNOSIS — K219 Gastro-esophageal reflux disease without esophagitis: Secondary | ICD-10-CM | POA: Diagnosis not present

## 2017-07-16 DIAGNOSIS — I10 Essential (primary) hypertension: Secondary | ICD-10-CM | POA: Diagnosis not present

## 2017-07-16 DIAGNOSIS — R2689 Other abnormalities of gait and mobility: Secondary | ICD-10-CM | POA: Diagnosis not present

## 2017-07-16 DIAGNOSIS — N1330 Unspecified hydronephrosis: Secondary | ICD-10-CM | POA: Diagnosis not present

## 2017-07-16 MED ORDER — ESOMEPRAZOLE MAGNESIUM 40 MG PO CPDR: 40.0000 mg | DELAYED_RELEASE_CAPSULE | Freq: Every day | ORAL | 0 refills | Status: DC

## 2017-07-16 NOTE — Discharge Summary (Signed)
Physician Discharge Summary  Patient ID: Timber Marshman MRN: [id number] DOB/AGE: [birth year] 62 y.o.  Admit date: 07/09/2017 Discharge date: 07/16/2017  Admission Diagnoses:  M17.11 Unilateral primary osteoartthritis, right knee <principal problem not specified>  Discharge Diagnoses:  M17.11 Unilateral primary osteoartthritis, right knee Active Problems:   Osteoarthritis of right knee   Past Medical History:  Diagnosis Date  . Arthritis    osteoarthritis right knee  . Cancer [facility]) 2014   endometrial CA  . Diabetes mellitus without complication ([place])   . Hyperlipidemia   . Hypertension     Surgeries: Procedure(s): TOTAL KNEE ARTHROPLASTY on 07/09/2017   Consultants (if any): Treatment Team:  Gladstone Lighter, MD [name], MD  Discharged Condition: Improved  Hospital Course: Josely Moffat is an 62 y.o. female who was admitted 07/09/2017 with a diagnosis of  M17.11 Unilateral primary osteoartthritis, right knee <principal problem not specified> and went to the operating room on 07/09/2017 and underwent the above named procedures.    She was given perioperative antibiotics:  Anti-infectives (From admission, onward)   Start     Dose/Rate Route Frequency Ordered Stop   07/09/17 1400  ceFAZolin (ANCEF) IVPB 2g/100 mL premix     2 g 200 mL/hr over 30 Minutes Intravenous Every 6 hours 07/09/17 1200 07/09/17 2147   07/09/17 0836  bacitracin injection  Status:  Discontinued       As needed 07/09/17 0837 07/09/17 0954   07/09/17 0600  ceFAZolin (ANCEF) IVPB 2g/100 mL premix     2 g 200 mL/hr over 30 Minutes Intravenous On call to O.R. 07/08/17 2137 07/09/17 0752   07/09/17 0553  ceFAZolin (ANCEF) 2-3 GM-%(50ML) IVPB SOLR    Note to Pharmacy:  [name]   : cabinet override      07/09/17 0553 07/09/17 1814    .  She was given sequential compression devices, early ambulation, and ECASA for DVT prophylaxis.  She developed abdominal and right flank pain  on POD #2. Hospitalist consult was obtained and CT scan showed hydronephrosis. Urology was consulted and the Foley catheter was replaced. She was monitored closely and her pain resolved. She is ready for transfer to skilled nursing with her catheter in place. She will follow-up with her Urologist for further management at the end of this week.  Recent vital signs:  Vitals:   07/16/17 0031 07/16/17 0722  BP: 125/70 126/76  Pulse: 78 91  Resp: 19 18  Temp: 98.3 F (36.8 C) 98.1 F (36.7 C)  SpO2: 94% 100%    Recent laboratory studies:  Lab Results  Component Value Date   HGB 11.4 (L) 07/14/2017   HGB 12.3 07/12/2017   HGB 11.7 (L) 07/11/2017   Lab Results  Component Value Date   WBC 9.7 07/14/2017   PLT 255 07/14/2017   Lab Results  Component Value Date   INR 0.94 07/04/2017   Lab Results  Component Value Date   NA 136 07/14/2017   K 4.0 07/14/2017   CL 100 (L) 07/14/2017   CO2 32 07/14/2017   BUN 14 07/14/2017   CREATININE 0.94 07/14/2017   GLUCOSE 142 (H) 07/14/2017    Discharge Medications:   Allergies as of 07/16/2017      Reactions   Iodinated Diagnostic Agents Anaphylaxis   Iodine Swelling      Medication List    STOP taking these medications   diclofenac sodium 1 % Gel Commonly known as:  VOLTAREN   PREMARIN vaginal cream Generic drug:  conjugated estrogens   TURMERIC CURCUMIN PO     TAKE these medications   acetaminophen 325 MG tablet Commonly known as:  TYLENOL Take 1-2 tablets (325-650 mg total) by mouth every 6 (six) hours as needed for mild pain (pain score 1-3 or temp > 100.5).   aspirin 325 MG EC tablet Take 1 tablet (325 mg total) by mouth daily with breakfast.   atorvastatin 40 MG tablet Commonly known as:  LIPITOR TAKE 1 TABLET BY MOUTH EVERY DAY What changed:    how much to take  how to take this  when to take this   celecoxib 100 MG capsule Commonly known as:  CELEBREX Take 100 mg by mouth 2 (two) times daily.    docusate sodium 100 MG capsule Commonly known as:  COLACE Take 1 capsule (100 mg total) by mouth 2 (two) times daily.   gabapentin 300 MG capsule Commonly known as:  NEURONTIN Take 1 capsule (300 mg total) by mouth 3 (three) times daily.   HYDROcodone-acetaminophen 5-325 MG tablet Commonly known as:  NORCO/VICODIN Take 1-2 tablets by mouth every 4 (four) hours as needed for moderate pain (pain score 4-6).   NEXIUM 40 MG capsule Generic drug:  esomeprazole Take 1 capsule (40 mg total) by mouth daily. Brand preferred by patient What changed:    when to take this  additional instructions   esomeprazole 40 MG capsule Commonly known as:  NEXIUM Take 1 capsule (40 mg total) by mouth daily. Start taking on:  07/17/2017 What changed:  You were already taking a medication with the same name, and this prescription was added. Make sure you understand how and when to take each.   zolpidem 10 MG tablet Commonly known as:  AMBIEN Take 1 tablet (10 mg total) by mouth at bedtime as needed for sleep. OK to fill early; titrating dose       Diagnostic Studies: Ct Abdomen Pelvis Wo Contrast  Result Date: 07/13/2017 CLINICAL DATA:  62 year old female with a history of right-sided abdominal pain radiating to the suprapubic region. Patient has a history of endometrial carcinoma. EXAM: CT ABDOMEN AND PELVIS WITHOUT CONTRAST TECHNIQUE: Multidetector CT imaging of the abdomen and pelvis was performed following the standard protocol without IV contrast. COMPARISON:  CT 11/21/2016 FINDINGS: Lower chest: No acute abnormality. Hepatobiliary: Unremarkable appearance of liver.  Cholecystectomy Pancreas: Unremarkable appearance of the pancreas Spleen: Unremarkable spleen Adrenals/Urinary Tract: Unremarkable adrenal glands. Right kidney demonstrates hydronephrosis with perinephric stranding with fluid in the perinephric space and extending inferiorly into the pelvis along the ureter. Fluid adjacent to the  duodenum within the retroperitoneal space. The length of the right ureter is dilated with no radiopaque stones. Ureter is dilated to its insertion on the urinary bladder. Left kidney demonstrates mild hydronephrosis and mild dilation of the left ureter, new from the comparison. No left-sided nephrolithiasis. No perinephric stranding at the left kidney. The urinary bladder is somewhat distended, reaching the level of the umbilicus. Asymmetric appearance of the left lower urinary bladder, near the insertion of the left ureter. Given that the left ureter is somewhat anterior, this may be a surgical reimplantation after the prior hysterectomy. This configuration is similar to the comparison CT. No radiopaque stones within the urinary bladder. Stomach/Bowel: Hiatal hernia. Unremarkable appearance of stomach. Small bowel nondistended with no transition point. Moderate stool burden. Colonic diverticula without evidence of associated inflammatory changes. No transition point. Vascular/Lymphatic: Calcifications of the abdominal aorta. No lymphadenopathy. Small lymph nodes of the retroperitoneum. Reproductive: Hysterectomy.  Surgical clips at the rectovesical space, at the site of the prior surgery. There is ill-defined soft tissue adjacent to the surgical clips, inseparable from the bladder base. Other: Fat containing umbilical hernia. Musculoskeletal: No acute displaced fracture. IMPRESSION: Right-sided hydronephrosis with perinephric fluid, likely spontaneous decompression, with persistently dilated right ureter. No radiopaque stones identified. The differential includes distal ureteral obstruction secondary to non radiopaque stones, blood/debris, ascending urinary tract infection, as well as obstructing ureterocele in the bladder. Recurrent tumor as a urinary obstruction cannot be excluded given the patient's history. Urology consultation may be useful, with consideration of cystoscopy. Mild left-sided hydronephrosis with  dilated left ureter. This may be chronic, with the left ureteral insertion somewhat anterior on the urinary bladder than expected, and I would favor this to represent a surgical reimplantation after the prior hysterectomy for endometrial carcinoma. Correlation with prior surgical record may be useful. Given there are no inflammatory changes or stones, the dilation of the ureter is favored to be a chronic postoperative finding. Correlation with lab values/urinalysis may be useful if there is concern for infection. Diverticular disease without evidence of acute diverticulitis. Hysterectomy These results were called by telephone at the time of interpretation on 07/13/2017 at 4:38 pm to Dr. Nicholes Mango , who verbally acknowledged these results. Electronically Signed   By: Corrie Mckusick D.O.   On: 07/13/2017 16:39   Dg Abd 1 View  Result Date: 07/12/2017 CLINICAL DATA:  Abdominal pain EXAM: ABDOMEN - 1 VIEW COMPARISON:  None. FINDINGS: Normal bowel gas pattern. Moderate amount of stool in the colon. No renal calculi. No focal bony lesion. Surgical clips in the pelvis. IMPRESSION: Moderate stool in the colon.  Negative for bowel obstruction. Electronically Signed   By: Franchot Gallo M.D.   On: 07/12/2017 16:46   Mr Knee Right Wo Contrast  Result Date: 06/19/2017 CLINICAL DATA:  Pre-surgical planning prior to arthroplasty EXAM: MRI OF THE RIGHT KNEE WITHOUT CONTRAST TECHNIQUE: Multiplanar, multisequence MR imaging of the knee was performed. No intravenous contrast was administered. COMPARISON:  None. FINDINGS: Limited evaluation for internal derangement secondary to a single sagittal PD series being performed. Examination was performed for preoperative planning prior to unilateral hemiarthroplasty. MENISCI Medial meniscus: Partial radial tear of the posterior horn of the medial meniscus. Lateral meniscus: Radial tear of the posterior horn of the lateral meniscus. LIGAMENTS Cruciates:  Intact ACL and PCL.  Collaterals: Not well delineated. CARTILAGE Patellofemoral:  Limited evaluation secondary to single PD series. Medial:   Limited evaluation secondary to single PD series. Lateral:  Limited evaluation secondary to single PD series. Joint: Small joint effusion. Unremarkable Hoffa's fat. Mildly thickened prominent suprapatellar plica. Popliteal Fossa:  No Baker's cyst.  Intact popliteus tendon. Extensor Mechanism: Intact quadriceps tendon. Intact patellar tendon. Bones:  No acute osseous abnormality.  No aggressive osseous lesion. Other: No fluid collection or hematoma.  Muscles are grossly normal. IMPRESSION: 1. Partial radial tear of the posterior horn of the medial meniscus. 2. Radial tear of the posterior horn of the lateral meniscus. Electronically Signed   By: Kathreen Devoid   On: 06/19/2017 09:49   Dg Knee Right Port  Result Date: 07/09/2017 CLINICAL DATA:  62 year old female status post right total knee replacement. EXAM: PORTABLE RIGHT KNEE - 1-2 VIEW COMPARISON:  Right knee series 07/24/2016. FINDINGS: Portable AP and cross-table lateral views of the right knee. Total knee arthroplasty hardware in place, appears intact, and normally aligned. Postoperative air in fluid in the joint. Anterior skin staples. No unexpected  osseous changes. IMPRESSION: Right total knee arthroplasty with no adverse features. Electronically Signed   By: Genevie Ann M.D.   On: 07/09/2017 10:30   Dg Bone Length  Result Date: 06/19/2017 CLINICAL DATA:  Right knee pain for 8 months.  No known injury. EXAM: BONE LENGTH COMPARISON:  None. FINDINGS: The right hip demonstrates no fracture or dislocation. The joint space is maintained. The right knee demonstrates no acute fracture or dislocation. The right ankle demonstrates no fracture or dislocation. The ankle mortise is intact. The soft tissues are normal. There is no aggressive osseous lesion. The right leg measures 96.7 cm from the superior aspect of the femoral head to the mid tibial  plafond and. There is genu valgus of right knee. IMPRESSION: The right leg measures 96.7 cm from the superior aspect of the femoral head to the mid tibial plafond and. There is genu valgus of right knee. Electronically Signed   By: Kathreen Devoid   On: 06/19/2017 08:52    Disposition: Discharge disposition: 03-Skilled [place] information for after-discharge care    Destination    [facility] .   Service:  Skilled Nursing Contact information: [address] [place] [phone]               Signed: Lovell Sheehan ,MD 07/16/2017, 9:46 AM

## 2017-07-16 NOTE — Progress Notes (Signed)
Patient is being discharged to [facility]. Report called. Waiting on EMS at this time.

## 2017-07-16 NOTE — Clinical Social Work Placement (Signed)
   CLINICAL SOCIAL WORK PLACEMENT  NOTE  Date:  07/16/2017  Patient Details  Name: Jean Gonzales MRN: [id number] Date of Birth: [birth year]  Clinical Social Work is seeking post-discharge placement for this patient at the [place] level of care (*CSW will initial, date and re-position this form in  chart as items are completed):  Yes   Patient/family provided with [facility] Work Department's list of facilities offering this level of care within the geographic area requested by the patient (or if unable, by the patient's family).  Yes   Patient/family informed of their freedom to choose among providers that offer the needed level of care, that participate in Medicare, Medicaid or managed care program needed by the patient, have an available bed and are willing to accept the patient.  Yes   Patient/family informed of [facility]'s ownership interest in [facility] and [facility], as well as of the fact that they are under no obligation to receive care at these facilities.  PASRR submitted to EDS on 07/10/17     PASRR number received on 07/10/17     Existing PASRR number confirmed on       FL2 transmitted to all facilities in geographic area requested by pt/family on 07/10/17     FL2 transmitted to all facilities within larger geographic area on       Patient informed that his/her managed care company has contracts with or will negotiate with certain facilities, including the following:        Yes   Patient/family informed of bed offers received.  Patient [facility] bed at [facility]     Physician recommends and patient chooses bed at      Patient to be transferred to [facility] on 07/16/17.  Patient to be transferred to facility by [facility] )     Patient family notified on 07/16/17 of transfer.  Name of family member notified:  (Patient reported that she notified her family of D/C today and does  not want CSW to make a call.  )     PHYSICIAN       Additional Comment:    _______________________________________________ Sharmel Ballantine, Veronia Beets, LCSW 07/16/2017, 10:39 AM

## 2017-07-16 NOTE — Progress Notes (Signed)
Patient is medically stable for D/C to [facility] today. Per [place] [facility] authorization has been received and patient can come today to room 606. RN will call report to 600 hall nurse at [phone] and arrange EMS for transport. Clinical Education officer, museum (CSW) sent D/C summary to [facility] via [facility]. Patient is aware of above. Patient reported that she notified her family of the D/C today and requested for CSW to not make a call. Please reconsult if future social work needs arise. CSW signing off.   [employer], LCSW [phone]

## 2017-07-16 NOTE — Care Management Important Message (Signed)
Important Message  Patient Details  Name: Jean Gonzales MRN: [id number] Date of Birth: [birth year]   Medicare Important Message Given:  Yes    Juliann Pulse A Belinda Bringhurst 07/16/2017, 11:35 AM

## 2017-07-16 NOTE — Progress Notes (Signed)
Physical Therapy Treatment Patient Details Name: Jean Gonzales MRN: [id number] DOB: 10/05/1955 Today's Date: 07/16/2017    History of Present Illness Pt is a 62 yo F diagnosed with osteoarthritis of the R knee and is s/p elective R TKA.  PMH includes: HTN, DM II, GERD, endometrial CA, and HLD.      PT Comments    Pt agreeable to PT; reports 7/10 pain R knee and 5/10 pain in abdomen. Pt perform bed mobility with Mod I with increased time and effort using hook techniques to self assist RLE over edge of bed. Pt progressing range of motion R knee 6-91 degrees; encouragement and education on stretching flexion and extension for R knee including technique, frequency and hold duration. Educated and encouraged QS, FAQ and ankle pumps as well. Pt wished return to bed post session; pt to discharge to skilled nursing facility today to continue rehab efforts.    Follow Up Recommendations  SNF     Equipment Recommendations  Other (comment)(TBD at SNF)    Recommendations for Other Services       Precautions / Restrictions Precautions Precautions: Knee;Fall Restrictions Weight Bearing Restrictions: Yes RLE Weight Bearing: Weight bearing as tolerated    Mobility  Bed Mobility Overal bed mobility: Modified Independent Bed Mobility: Supine to Sit;Sit to Supine     Supine to sit: Modified independent (Device/Increase time) Sit to supine: Modified independent (Device/Increase time)   General bed mobility comments: Increased time and self assists with LLE using hook technique  Transfers Overall transfer level: Needs assistance Equipment used: Rolling walker (2 wheeled) Transfers: Sit to/from Stand Sit to Stand: Min guard         General transfer comment: cues for hand placement  Ambulation/Gait                 Stairs             Wheelchair Mobility    Modified Rankin (Stroke Patients Only)       Balance Overall balance assessment: Needs  assistance Sitting-balance support: Feet supported;No upper extremity supported Sitting balance-Leahy Scale: Good     Standing balance support: Bilateral upper extremity supported Standing balance-Leahy Scale: Fair                              Cognition Arousal/Alertness: Awake/alert Behavior During Therapy: WFL for tasks assessed/performed Overall Cognitive Status: Within Functional Limits for tasks assessed                                        Exercises Total Joint Exercises Quad Sets: Strengthening;Both;20 reps;Supine Long Arc Quad: AAROM;Right;10 reps;Seated(2 sets) Knee Flexion: AROM;Right;10 reps;Seated(3 positions each rep with 10 sec hold each) Goniometric ROM: 6 to 91 Marching in Standing: AROM;Right;10 reps;Standing    General Comments        Pertinent Vitals/Pain Pain Assessment: 0-10 Pain Score: 7  Pain Location: R knee(Also abdominal pain 5/10)    Home Living                      Prior Function            PT Goals (current goals can now be found in the care plan section) Progress towards PT goals: Progressing toward goals    Frequency    BID      PT Plan  Current plan remains appropriate    Co-evaluation              AM-PAC PT "6 Clicks" Daily Activity  Outcome Measure  Difficulty turning over in bed (including adjusting bedclothes, sheets and blankets)?: A Little Difficulty moving from lying on back to sitting on the side of the bed? : A Little Difficulty sitting down on and standing up from a chair with arms (e.g., wheelchair, bedside commode, etc,.)?: Unable Help needed moving to and from a bed to chair (including a wheelchair)?: A Little Help needed walking in hospital room?: A Little Help needed climbing 3-5 steps with a railing? : A Lot 6 Click Score: 15    End of Session Equipment Utilized During Treatment: Gait belt Activity Tolerance: Patient tolerated treatment well Patient left: in  bed;with call bell/phone within reach;with bed alarm set;Other (comment)(polar care)   PT Visit Diagnosis: Muscle weakness (generalized) (M62.81);Other abnormalities of gait and mobility (R26.89);Pain;Difficulty in walking, not elsewhere classified (R26.2) Pain - Right/Left: Right Pain - part of body: Knee     Time: 5361-4431 PT Time Calculation (min) (ACUTE ONLY): 29 min  Charges:  $Therapeutic Exercise: 8-22 mins $Therapeutic Activity: 8-22 mins                    G Codes:        Larae Grooms, PTA 07/16/2017, 10:50 AM

## 2017-07-17 ENCOUNTER — Telehealth: Payer: Self-pay | Admitting: Urology

## 2017-07-17 DIAGNOSIS — N1339 Other hydronephrosis: Secondary | ICD-10-CM

## 2017-07-17 NOTE — Telephone Encounter (Signed)
Patient is calling and asking what you want her do do as far as follow up?    Jean Gonzales

## 2017-07-17 NOTE — Telephone Encounter (Signed)
She needs a nurse visit/bladder scan in 1 week.  Please check to see if she has been scheduled for a renal ultrasound before her Foley catheter has been removed.

## 2017-07-18 ENCOUNTER — Telehealth: Payer: Self-pay | Admitting: Urology

## 2017-07-18 NOTE — Telephone Encounter (Signed)
Can you put the order for the RUS in please?   [name]

## 2017-07-18 NOTE — Telephone Encounter (Signed)
Order was entered 

## 2017-07-18 NOTE — Telephone Encounter (Signed)
Patient was transferred to scheduling to get her RUS scheduled. She has also been scheduled for a follow up here in the office. Can you clarify who needs to take her catheter out.  [name]

## 2017-07-19 DIAGNOSIS — Z8679 Personal history of other diseases of the circulatory system: Secondary | ICD-10-CM | POA: Diagnosis not present

## 2017-07-19 DIAGNOSIS — E785 Hyperlipidemia, unspecified: Secondary | ICD-10-CM | POA: Diagnosis not present

## 2017-07-19 DIAGNOSIS — R339 Retention of urine, unspecified: Secondary | ICD-10-CM | POA: Diagnosis not present

## 2017-07-19 DIAGNOSIS — M159 Polyosteoarthritis, unspecified: Secondary | ICD-10-CM | POA: Diagnosis not present

## 2017-07-19 DIAGNOSIS — K219 Gastro-esophageal reflux disease without esophagitis: Secondary | ICD-10-CM | POA: Diagnosis not present

## 2017-07-19 DIAGNOSIS — Z96651 Presence of right artificial knee joint: Secondary | ICD-10-CM | POA: Diagnosis not present

## 2017-07-20 ENCOUNTER — Ambulatory Visit
Admission: RE | Admit: 2017-07-20 | Discharge: 2017-07-20 | Disposition: A | Discharge status: Home / Self Care | Payer: Medicare HMO | Source: Ambulatory Visit | Attending: Urology | Admitting: Urology

## 2017-07-20 DIAGNOSIS — N1330 Unspecified hydronephrosis: Secondary | ICD-10-CM | POA: Diagnosis not present

## 2017-07-20 DIAGNOSIS — N1339 Other hydronephrosis: Secondary | ICD-10-CM | POA: Diagnosis not present

## 2017-07-21 DIAGNOSIS — M069 Rheumatoid arthritis, unspecified: Secondary | ICD-10-CM | POA: Diagnosis not present

## 2017-07-21 DIAGNOSIS — L4052 Psoriatic arthritis mutilans: Secondary | ICD-10-CM | POA: Diagnosis not present

## 2017-07-21 DIAGNOSIS — R69 Illness, unspecified: Secondary | ICD-10-CM | POA: Diagnosis not present

## 2017-07-21 DIAGNOSIS — Z471 Aftercare following joint replacement surgery: Secondary | ICD-10-CM | POA: Diagnosis not present

## 2017-07-21 DIAGNOSIS — I10 Essential (primary) hypertension: Secondary | ICD-10-CM | POA: Diagnosis not present

## 2017-07-21 DIAGNOSIS — R32 Unspecified urinary incontinence: Secondary | ICD-10-CM | POA: Diagnosis not present

## 2017-07-21 DIAGNOSIS — N134 Hydroureter: Secondary | ICD-10-CM | POA: Diagnosis not present

## 2017-07-21 DIAGNOSIS — E119 Type 2 diabetes mellitus without complications: Secondary | ICD-10-CM | POA: Diagnosis not present

## 2017-07-21 DIAGNOSIS — N131 Hydronephrosis with ureteral stricture, not elsewhere classified: Secondary | ICD-10-CM | POA: Diagnosis not present

## 2017-07-21 DIAGNOSIS — Z466 Encounter for fitting and adjustment of urinary device: Secondary | ICD-10-CM | POA: Diagnosis not present

## 2017-07-23 ENCOUNTER — Telehealth: Payer: Self-pay | Admitting: Urology

## 2017-07-23 ENCOUNTER — Telehealth: Payer: Self-pay

## 2017-07-23 DIAGNOSIS — M1990 Unspecified osteoarthritis, unspecified site: Secondary | ICD-10-CM | POA: Diagnosis not present

## 2017-07-23 NOTE — Telephone Encounter (Signed)
Waiting for pt to call to let us know info about where to send orders to have home health remove her foley, Pt then needs to sched a nurse visit 48hrs after her foley has been removed per Dr. Bernardo Heater.

## 2017-07-23 NOTE — Telephone Encounter (Signed)
Spoke with patient she will cb to schedule the nurse visit   [name]

## 2017-07-23 NOTE — Telephone Encounter (Signed)
-----   Message from [name], MD sent at 07/22/2017  8:47 AM EDT ----- Hydronephrosis has resolved.  Her Foley catheter can be removed.  If she is still in rehabilitation we can send an order or she can come for a nurse visit/catheter removal.  She does need a follow-up nurse visit with bladder scan approximately 1 week after catheter removal.

## 2017-07-23 NOTE — Telephone Encounter (Signed)
Pt requested that we have [name] come out and remove her foley cath. Dr Bernardo Heater authorized this, orders were given to [facility] with [facility] and they are going out to remove foley. Pt needs to schedule a 1 week nurse visit for bladder scan.

## 2017-07-26 ENCOUNTER — Encounter: Payer: Self-pay | Admitting: Physician Assistant

## 2017-07-27 ENCOUNTER — Ambulatory Visit: Payer: Medicare HMO

## 2017-08-01 ENCOUNTER — Ambulatory Visit (INDEPENDENT_AMBULATORY_CARE_PROVIDER_SITE_OTHER): Payer: Medicare HMO | Admitting: Family Medicine

## 2017-08-01 ENCOUNTER — Encounter: Payer: Self-pay | Admitting: Family Medicine

## 2017-08-01 VITALS — BP 154/82 | HR 86 | Ht 68.0 in

## 2017-08-01 DIAGNOSIS — R339 Retention of urine, unspecified: Secondary | ICD-10-CM | POA: Diagnosis not present

## 2017-08-01 DIAGNOSIS — Z96659 Presence of unspecified artificial knee joint: Secondary | ICD-10-CM | POA: Insufficient documentation

## 2017-08-01 LAB — BLADDER SCAN AMB NON-IMAGING: Scan Result: 395

## 2017-08-01 NOTE — Progress Notes (Signed)
Patient was seen on office today for PVR, results was 395  She had a knee replacement 1 month ago and went into urinary retention after surgery. She had Cath removed from Home health 1 week ago. Per [employer] she is to start Tamsulosin again and follow up with him in 1 month.

## 2017-08-16 ENCOUNTER — Other Ambulatory Visit: Payer: Self-pay | Admitting: Physician Assistant

## 2017-08-16 DIAGNOSIS — M069 Rheumatoid arthritis, unspecified: Secondary | ICD-10-CM

## 2017-08-16 DIAGNOSIS — L4050 Arthropathic psoriasis, unspecified: Secondary | ICD-10-CM

## 2017-08-18 NOTE — Telephone Encounter (Signed)
closing

## 2017-08-21 DIAGNOSIS — Z96659 Presence of unspecified artificial knee joint: Secondary | ICD-10-CM | POA: Diagnosis not present

## 2017-08-27 ENCOUNTER — Encounter: Payer: Self-pay | Admitting: Urology

## 2017-08-27 ENCOUNTER — Ambulatory Visit (INDEPENDENT_AMBULATORY_CARE_PROVIDER_SITE_OTHER): Payer: Medicare HMO | Admitting: Urology

## 2017-08-27 VITALS — BP 112/72 | HR 106 | Ht 68.0 in | Wt 244.0 lb

## 2017-08-27 DIAGNOSIS — R339 Retention of urine, unspecified: Secondary | ICD-10-CM | POA: Diagnosis not present

## 2017-08-27 DIAGNOSIS — R399 Unspecified symptoms and signs involving the genitourinary system: Secondary | ICD-10-CM

## 2017-08-27 DIAGNOSIS — Z87448 Personal history of other diseases of urinary system: Secondary | ICD-10-CM | POA: Diagnosis not present

## 2017-08-27 LAB — URINALYSIS, COMPLETE
BILIRUBIN UA: NEGATIVE
Glucose, UA: NEGATIVE
Ketones, UA: NEGATIVE
Nitrite, UA: POSITIVE — AB
PROTEIN UA: NEGATIVE
Specific Gravity, UA: 1.01 (ref 1.005–1.030)
Urobilinogen, Ur: 0.2 mg/dL (ref 0.2–1.0)
pH, UA: 5.5 (ref 5.0–7.5)

## 2017-08-27 LAB — BLADDER SCAN AMB NON-IMAGING

## 2017-08-27 LAB — MICROSCOPIC EXAMINATION

## 2017-08-27 MED ORDER — CEFUROXIME AXETIL 500 MG PO TABS: 500.0000 mg | ORAL_TABLET | Freq: Two times a day (BID) | ORAL | 0 refills | Status: AC

## 2017-08-27 NOTE — Progress Notes (Signed)
08/27/2017 12:19 PM   Jean Gonzales [birth year] [id number]  Referring provider: Mar Daring, PA-C [address], [place] [address]  Chief Complaint  Patient presents with  . Urinary Tract Infection  . Urinary Retention    HPI: 62 year old female presents for follow-up.  She has a history of incomplete bladder emptying and was undergoing periodic urethral dilation.  She had a total knee replacement in April 2019 and developed right flank pain and a CT showed right hydronephrosis.  Refer to my consultation note dated 07/13/2017.  Her bladder was noted to be distended and a Foley catheter was placed.  Her hydronephrosis resolved with bladder drainage and this had previously occurred in 2018.  She presently has no voiding complaints.  A PVR performed last month was 395 mL.  She is back on tamsulosin.  A PVR performed today by bladder scan was 269 mL.   PMH: Past Medical History:  Diagnosis Date  . Arthritis    osteoarthritis right knee  . Cancer [facility]) 2014   endometrial CA  . Diabetes mellitus without complication ([place])   . Hyperlipidemia   . Hypertension     Surgical History: Past Surgical History:  Procedure Laterality Date  . ABDOMINAL HYSTERECTOMY  2015   cancer  . BREAST BIOPSY Right 2008ish   core  . CHOLECYSTECTOMY  1992  . TONSILLECTOMY  1973  . TONSILLECTOMY    . TOTAL KNEE ARTHROPLASTY Right 07/09/2017   Procedure: TOTAL KNEE ARTHROPLASTY;  Surgeon: Lovell Sheehan, MD;  Location: [facility] ORS;  Service: Orthopedics;  Laterality: Right;    Home Medications:  Allergies as of 08/27/2017      Reactions   Iodinated Diagnostic Agents Anaphylaxis   Iodine Swelling      Medication List        Accurate as of 08/27/17 11:59 PM. Always use your most recent med list.          acetaminophen 325 MG tablet Commonly known as:  TYLENOL Take 1-2 tablets (325-650 mg total) by mouth every 6 (six) hours as needed for mild pain (pain score 1-3  or temp > 100.5).   aspirin 325 MG EC tablet Take 1 tablet (325 mg total) by mouth daily with breakfast.   atorvastatin 40 MG tablet Commonly known as:  LIPITOR TAKE 1 TABLET BY MOUTH EVERY DAY   cefUROXime 500 MG tablet Commonly known as:  CEFTIN Take 1 tablet (500 mg total) by mouth 2 (two) times daily with a meal for 7 days.   celecoxib 100 MG capsule Commonly known as:  CELEBREX Take 100 mg by mouth 2 (two) times daily.   docusate sodium 100 MG capsule Commonly known as:  COLACE Take 1 capsule (100 mg total) by mouth 2 (two) times daily.   FLOMAX 0.4 MG Caps capsule Generic drug:  tamsulosin Take by mouth.   gabapentin 300 MG capsule Commonly known as:  NEURONTIN Take 1 capsule (300 mg total) by mouth 3 (three) times daily.   hydrochlorothiazide 25 MG tablet Commonly known as:  HYDRODIURIL Take 25 mg by mouth daily.   HYDROcodone-acetaminophen 5-325 MG tablet Commonly known as:  NORCO/VICODIN Take 1-2 tablets by mouth every 4 (four) hours as needed for moderate pain (pain score 4-6).   NEXIUM 40 MG capsule Generic drug:  esomeprazole Take 1 capsule (40 mg total) by mouth daily. Brand preferred by patient   esomeprazole 40 MG capsule Commonly known as:  NEXIUM Take 1 capsule (40 mg total)  by mouth daily.   zolpidem 10 MG tablet Commonly known as:  AMBIEN Take 1 tablet (10 mg total) by mouth at bedtime as needed for sleep. OK to fill early; titrating dose       Allergies:  Allergies  Allergen Reactions  . Iodinated Diagnostic Agents Anaphylaxis  . Iodine Swelling    Family History: Family History  Problem Relation Age of Onset  . Diabetes Mother   . Cancer Mother   . Heart disease Father   . Breast cancer Neg Hx   . Kidney cancer Neg Hx   . Bladder Cancer Neg Hx     Social History:  reports that she has never smoked. She has never used smokeless tobacco. She reports that she does not drink alcohol or use drugs.  ROS: UROLOGY Frequent  Urination?: Yes Hard to postpone urination?: No Burning/pain with urination?: Yes Get up at night to urinate?: Yes Leakage of urine?: Yes Urine stream starts and stops?: No Trouble starting stream?: No Do you have to strain to urinate?: No Blood in urine?: No Urinary tract infection?: No Sexually transmitted disease?: No Injury to kidneys or bladder?: No Painful intercourse?: No Weak stream?: No Currently pregnant?: No Vaginal bleeding?: No Last menstrual period?: N/A  Gastrointestinal Nausea?: No Vomiting?: No Indigestion/heartburn?: No Diarrhea?: No Constipation?: No  Constitutional Fever: No Night sweats?: No Weight loss?: No Fatigue?: No  Skin Skin rash/lesions?: No Itching?: No  Eyes Blurred vision?: No Double vision?: No  Ears/Nose/Throat Sore throat?: No Sinus problems?: No  Hematologic/Lymphatic Swollen glands?: No Easy bruising?: No  Cardiovascular Leg swelling?: No Chest pain?: No  Respiratory Cough?: No Shortness of breath?: No  Endocrine Excessive thirst?: No  Musculoskeletal Back pain?: No Joint pain?: No  Neurological Headaches?: No Dizziness?: No  Psychologic Depression?: No Anxiety?: No  Physical Exam: BP 112/72 (BP Location: Left Arm, Patient Position: Sitting, Cuff Size: Large)   Pulse (!) 106   Ht 5\' 8"  (1.727 m)   Wt 244 lb (110.7 kg)   BMI 37.10 kg/m   Constitutional:  Alert and oriented, No acute distress. HEENT: [place] AT, moist mucus membranes.  Trachea midline, no masses. Cardiovascular: No clubbing, cyanosis, or edema. Respiratory: Normal respiratory effort, no increased work of breathing. GI: Abdomen is soft, nontender, nondistended, no abdominal masses GU: No CVA tenderness Lymph: No cervical or inguinal lymphadenopathy. Skin: No rashes, bruises or suspicious lesions. Neurologic: Grossly intact, no focal deficits, moving all 4 extremities. Psychiatric: Normal mood and affect.  Laboratory  Data:   Urinalysis Dipstick trace intact blood, 1+ leukocytes, nitrite positive; microscopy 11-30 WBC   Assessment & Plan:   62 year old female with incomplete bladder emptying and history of hydronephrosis.  PVR today was 269 mL.  Will schedule a follow-up renal ultrasound to see if she has hydronephrosis at this volume.  She is unable to perform intermittent catheterization because of wrist problems.  Urinalysis today shows pyuria and was nitrite positive.  Although asymptomatic she is recently postop from a total knee replacement and will start on antibiotic therapy.  A urine culture was ordered.   [name], [facility] [address] [place], [place] [address] [phone]

## 2017-08-28 ENCOUNTER — Encounter: Payer: Self-pay | Admitting: Urology

## 2017-08-30 LAB — CULTURE, URINE COMPREHENSIVE

## 2017-08-31 ENCOUNTER — Telehealth: Payer: Self-pay | Admitting: Family Medicine

## 2017-08-31 NOTE — Telephone Encounter (Signed)
Patient notified and will complete ABX.

## 2017-08-31 NOTE — Telephone Encounter (Signed)
-----   Message from [name], MD sent at 08/31/2017  4:01 PM EDT ----- Urine culture was positive and sensitive to cefuroxime

## 2017-09-05 ENCOUNTER — Other Ambulatory Visit: Payer: Self-pay | Admitting: Physician Assistant

## 2017-09-05 DIAGNOSIS — F5101 Primary insomnia: Secondary | ICD-10-CM

## 2017-09-07 ENCOUNTER — Other Ambulatory Visit: Payer: Self-pay | Admitting: Physician Assistant

## 2017-09-07 ENCOUNTER — Ambulatory Visit
Admission: RE | Admit: 2017-09-07 | Discharge: 2017-09-07 | Disposition: A | Discharge status: Home / Self Care | Payer: Medicare HMO | Source: Ambulatory Visit | Attending: Urology | Admitting: Urology

## 2017-09-07 DIAGNOSIS — R9341 Abnormal radiologic findings on diagnostic imaging of renal pelvis, ureter, or bladder: Secondary | ICD-10-CM | POA: Insufficient documentation

## 2017-09-07 DIAGNOSIS — Z87448 Personal history of other diseases of urinary system: Secondary | ICD-10-CM | POA: Diagnosis not present

## 2017-09-07 DIAGNOSIS — N3289 Other specified disorders of bladder: Secondary | ICD-10-CM | POA: Diagnosis not present

## 2017-09-07 DIAGNOSIS — M069 Rheumatoid arthritis, unspecified: Secondary | ICD-10-CM

## 2017-09-07 DIAGNOSIS — L4050 Arthropathic psoriasis, unspecified: Secondary | ICD-10-CM

## 2017-09-11 ENCOUNTER — Telehealth: Payer: Self-pay

## 2017-09-11 NOTE — Telephone Encounter (Signed)
Pt informed, please call to schedule 2 month with Larene [facility]

## 2017-09-11 NOTE — Telephone Encounter (Signed)
[facility] with appointment information & mailed appointment reminder to patient.

## 2017-09-11 NOTE — Telephone Encounter (Signed)
Opened in error

## 2017-09-11 NOTE — Telephone Encounter (Signed)
-----   Message from [name], MD sent at 09/09/2017 11:55 AM EDT ----- Renal ultrasound showed no hydronephrosis.  Recommend follow-up with [facility] in 2 months.

## 2017-09-19 ENCOUNTER — Ambulatory Visit (INDEPENDENT_AMBULATORY_CARE_PROVIDER_SITE_OTHER): Payer: Medicare HMO | Admitting: Urology

## 2017-09-19 DIAGNOSIS — R339 Retention of urine, unspecified: Secondary | ICD-10-CM | POA: Diagnosis not present

## 2017-09-19 NOTE — Progress Notes (Signed)
Patient present today stating that she has only urinated once today and not very much. She is having right flank pain as well.   Bladder Scan Patient cannot void: 385 ml Performed By: Fonnie Jarvis, CMA  Per [name], PAC a cath was placed for patient to follow up in one week for a VT and urethral dilation.   Simple Catheter Placement  Due to urinary retention patient is present today for a foley cath placement.  Patient was cleaned and prepped in a sterile fashion with betadine and lidocaine jelly 2% was instilled into the urethra.  A 16 FR foley catheter was inserted, urine return was noted  370ml, urine was yellow in color.  The balloon was filled with 10cc of sterile water.  A leg bag was attached for drainage. Patient was also given a night bag to take home and was given instruction on how to change from one bag to another.  Patient was given instruction on proper catheter care.  Patient tolerated well, no complications were noted   Preformed by: Fonnie Jarvis, CMA

## 2017-09-25 NOTE — Progress Notes (Signed)
09/26/2017 11:14 AM   [name] May 18, 1955 [id number]  Referring provider: Mar Daring, PA-C [place] [place], [place] [address]  Chief Complaint  Patient presents with  . Urinary Retention    HPI: Patient is a 62 year old Caucasian female with a history of incomplete bladder emptying who presents today for a TOV.    She presented to the office on 09/19/2017 with the complaints of only voiding once that day.  Bladder scan was 385 mL.  A Foley catheter was placed for bladder decompression.    She had a total knee replacement in April 2019 and developed right flank pain and a CT showed right hydronephrosis.  Her bladder was noted to be distended and a Foley catheter was placed.  Her hydronephrosis resolved with bladder drainage and this had previously occurred in 2018.  She presently has no voiding complaints.  She is back on tamsulosin.   RUS in 08/2017 noted no hydronephrosis.  Foley is removed without difficulty.   She is going to try and cath herself again at home.  Patient denies any gross hematuria, dysuria or suprapubic/flank pain.  Patient denies any fevers, chills, nausea or vomiting.    She states that she feels she may be getting another urinary tract infection as the urine is getting dark.  She states that she does not have typical symptoms of UTIs such as dysuria due to the radiation to her vulvar area.  PMH: Past Medical History:  Diagnosis Date  . Arthritis    osteoarthritis right knee  . Cancer [facility]) 2014   endometrial CA  . Diabetes mellitus without complication ([place])   . Hyperlipidemia   . Hypertension     Surgical History: Past Surgical History:  Procedure Laterality Date  . ABDOMINAL HYSTERECTOMY  2015   cancer  . BREAST BIOPSY Right 2008ish   core  . CHOLECYSTECTOMY  1992  . TONSILLECTOMY  1973  . TONSILLECTOMY    . TOTAL KNEE ARTHROPLASTY Right 07/09/2017   Procedure: TOTAL KNEE ARTHROPLASTY;  Surgeon: Lovell Sheehan, MD;  Location: [facility] ORS;  Service: Orthopedics;  Laterality: Right;    Home Medications:  Allergies as of 09/26/2017      Reactions   Iodinated Diagnostic Agents Anaphylaxis   Iodine Swelling      Medication List        Accurate as of 09/26/17 11:14 AM. Always use your most recent med list.          acetaminophen 325 MG tablet Commonly known as:  TYLENOL Take 1-2 tablets (325-650 mg total) by mouth every 6 (six) hours as needed for mild pain (pain score 1-3 or temp > 100.5).   aspirin 325 MG EC tablet Take 1 tablet (325 mg total) by mouth daily with breakfast.   atorvastatin 40 MG tablet Commonly known as:  LIPITOR TAKE 1 TABLET BY MOUTH EVERY DAY   celecoxib 100 MG capsule Commonly known as:  CELEBREX TAKE 1 CAPSULE BY MOUTH TWICE A DAY   gabapentin 300 MG capsule Commonly known as:  NEURONTIN Take 1 capsule (300 mg total) by mouth 3 (three) times daily.   hydrochlorothiazide 25 MG tablet Commonly known as:  HYDRODIURIL Take 25 mg by mouth daily.   HYDROcodone-acetaminophen 5-325 MG tablet Commonly known as:  NORCO/VICODIN Take 1-2 tablets by mouth every 4 (four) hours as needed for moderate pain (pain score 4-6).   NEXIUM 40 MG capsule Generic drug:  esomeprazole Take 1 capsule (40  mg total) by mouth daily. Brand preferred by patient   sulfamethoxazole-trimethoprim 800-160 MG tablet Commonly known as:  BACTRIM DS,SEPTRA DS Take 1 tablet by mouth every 12 (twelve) hours.   zolpidem 10 MG tablet Commonly known as:  AMBIEN Take 1 tablet (10 mg total) by mouth at bedtime as needed for sleep.       Allergies:  Allergies  Allergen Reactions  . Iodinated Diagnostic Agents Anaphylaxis  . Iodine Swelling    Family History: Family History  Problem Relation Age of Onset  . Diabetes Mother   . Cancer Mother   . Heart disease Father   . Breast cancer Neg Hx   . Kidney cancer Neg Hx   . Bladder Cancer Neg Hx     Social History:  reports that she  has never smoked. She has never used smokeless tobacco. She reports that she does not drink alcohol or use drugs.  ROS: UROLOGY Frequent Urination?: No Hard to postpone urination?: No Burning/pain with urination?: No Get up at night to urinate?: No Leakage of urine?: No Urine stream starts and stops?: No Trouble starting stream?: No Do you have to strain to urinate?: No Blood in urine?: No Urinary tract infection?: No Sexually transmitted disease?: No Injury to kidneys or bladder?: No Painful intercourse?: No Weak stream?: No Currently pregnant?: No Vaginal bleeding?: No Last menstrual period?: n  Gastrointestinal Nausea?: No Vomiting?: No Indigestion/heartburn?: No Diarrhea?: No Constipation?: No  Constitutional Fever: No Night sweats?: No Weight loss?: No Fatigue?: No  Skin Skin rash/lesions?: No Itching?: No  Eyes Blurred vision?: No Double vision?: No  Ears/Nose/Throat Sore throat?: No Sinus problems?: No  Hematologic/Lymphatic Swollen glands?: No Easy bruising?: No  Cardiovascular Leg swelling?: No Chest pain?: No  Respiratory Cough?: No Shortness of breath?: No  Endocrine Excessive thirst?: No  Musculoskeletal Back pain?: No Joint pain?: No  Neurological Headaches?: No Dizziness?: No  Psychologic Depression?: No Anxiety?: No  Physical Exam: BP (!) 144/99 (BP Location: Right Arm, Patient Position: Sitting, Cuff Size: Normal)   Pulse 77   Ht 5\' 8"  (1.727 m)   Wt 245 lb 4.8 oz (111.3 kg)   BMI 37.30 kg/m   Constitutional: Well nourished. Alert and oriented, No acute distress. HEENT: [place] AT, moist mucus membranes. Trachea midline, no masses. Cardiovascular: No clubbing, cyanosis, or edema. Respiratory: Normal respiratory effort, no increased work of breathing. Skin: No rashes, bruises or suspicious lesions. Lymph: No cervical or inguinal adenopathy. Neurologic: Grossly intact, no focal deficits, moving all 4  extremities. Psychiatric: Normal mood and affect.   Laboratory Data: Urinalysis  Assessment & Plan:    1. Incomplete bladder emptying Foley removed today Continue tamsulosin 0.4 mg daily Patient will try to CIC at home RTC in one month for PVR, dilation and UA  2. History of hydronephrosis Resolved with bladder decompression  3. ?UTI Patient states she has atypical symptoms of UTIs I have sent a prescription for Septra DS to her pharmacy and advised her not to take the medication unless her urine continues to darken or becomes malodorous or if she should have fevers or suprapubic pain  [name], PA-C  [facility] [address], [place], [place] [address] [phone]

## 2017-09-26 ENCOUNTER — Encounter: Payer: Self-pay | Admitting: Urology

## 2017-09-26 ENCOUNTER — Ambulatory Visit (INDEPENDENT_AMBULATORY_CARE_PROVIDER_SITE_OTHER): Payer: Medicare HMO | Admitting: Urology

## 2017-09-26 VITALS — BP 144/99 | HR 77 | Ht 68.0 in | Wt 245.3 lb

## 2017-09-26 DIAGNOSIS — Z87448 Personal history of other diseases of urinary system: Secondary | ICD-10-CM | POA: Diagnosis not present

## 2017-09-26 DIAGNOSIS — R339 Retention of urine, unspecified: Secondary | ICD-10-CM | POA: Diagnosis not present

## 2017-09-26 DIAGNOSIS — R399 Unspecified symptoms and signs involving the genitourinary system: Secondary | ICD-10-CM | POA: Diagnosis not present

## 2017-09-26 MED ORDER — SULFAMETHOXAZOLE-TRIMETHOPRIM 800-160 MG PO TABS: 1.0000 | ORAL_TABLET | Freq: Two times a day (BID) | ORAL | 0 refills | Status: DC

## 2017-09-28 ENCOUNTER — Other Ambulatory Visit: Payer: Self-pay

## 2017-09-28 ENCOUNTER — Observation Stay
Admission: EM | Admit: 2017-09-28 | Discharge: 2017-09-29 | Disposition: A | Discharge status: Home / Self Care | Payer: Medicare HMO | Attending: Internal Medicine | Admitting: Internal Medicine

## 2017-09-28 ENCOUNTER — Emergency Department: Payer: Medicare HMO

## 2017-09-28 DIAGNOSIS — I10 Essential (primary) hypertension: Secondary | ICD-10-CM | POA: Insufficient documentation

## 2017-09-28 DIAGNOSIS — E785 Hyperlipidemia, unspecified: Secondary | ICD-10-CM | POA: Insufficient documentation

## 2017-09-28 DIAGNOSIS — M1990 Unspecified osteoarthritis, unspecified site: Secondary | ICD-10-CM | POA: Diagnosis not present

## 2017-09-28 DIAGNOSIS — Z833 Family history of diabetes mellitus: Secondary | ICD-10-CM | POA: Insufficient documentation

## 2017-09-28 DIAGNOSIS — Z90710 Acquired absence of both cervix and uterus: Secondary | ICD-10-CM | POA: Diagnosis not present

## 2017-09-28 DIAGNOSIS — Z96651 Presence of right artificial knee joint: Secondary | ICD-10-CM | POA: Insufficient documentation

## 2017-09-28 DIAGNOSIS — E669 Obesity, unspecified: Secondary | ICD-10-CM | POA: Insufficient documentation

## 2017-09-28 DIAGNOSIS — Z8249 Family history of ischemic heart disease and other diseases of the circulatory system: Secondary | ICD-10-CM | POA: Insufficient documentation

## 2017-09-28 DIAGNOSIS — E1165 Type 2 diabetes mellitus with hyperglycemia: Secondary | ICD-10-CM | POA: Diagnosis not present

## 2017-09-28 DIAGNOSIS — R600 Localized edema: Secondary | ICD-10-CM

## 2017-09-28 DIAGNOSIS — Z6837 Body mass index (BMI) 37.0-37.9, adult: Secondary | ICD-10-CM | POA: Diagnosis not present

## 2017-09-28 DIAGNOSIS — G43909 Migraine, unspecified, not intractable, without status migrainosus: Secondary | ICD-10-CM | POA: Insufficient documentation

## 2017-09-28 DIAGNOSIS — R0789 Other chest pain: Secondary | ICD-10-CM | POA: Diagnosis not present

## 2017-09-28 DIAGNOSIS — Z7982 Long term (current) use of aspirin: Secondary | ICD-10-CM | POA: Insufficient documentation

## 2017-09-28 DIAGNOSIS — K219 Gastro-esophageal reflux disease without esophagitis: Secondary | ICD-10-CM | POA: Insufficient documentation

## 2017-09-28 DIAGNOSIS — Z86010 Personal history of colonic polyps: Secondary | ICD-10-CM | POA: Insufficient documentation

## 2017-09-28 DIAGNOSIS — Z888 Allergy status to other drugs, medicaments and biological substances status: Secondary | ICD-10-CM | POA: Insufficient documentation

## 2017-09-28 DIAGNOSIS — Z91041 Radiographic dye allergy status: Secondary | ICD-10-CM | POA: Insufficient documentation

## 2017-09-28 DIAGNOSIS — L4050 Arthropathic psoriasis, unspecified: Secondary | ICD-10-CM | POA: Insufficient documentation

## 2017-09-28 DIAGNOSIS — Z9889 Other specified postprocedural states: Secondary | ICD-10-CM | POA: Diagnosis not present

## 2017-09-28 DIAGNOSIS — Z79899 Other long term (current) drug therapy: Secondary | ICD-10-CM | POA: Diagnosis not present

## 2017-09-28 DIAGNOSIS — M069 Rheumatoid arthritis, unspecified: Secondary | ICD-10-CM | POA: Insufficient documentation

## 2017-09-28 DIAGNOSIS — E7800 Pure hypercholesterolemia, unspecified: Secondary | ICD-10-CM

## 2017-09-28 DIAGNOSIS — Z9049 Acquired absence of other specified parts of digestive tract: Secondary | ICD-10-CM | POA: Insufficient documentation

## 2017-09-28 DIAGNOSIS — Z809 Family history of malignant neoplasm, unspecified: Secondary | ICD-10-CM | POA: Diagnosis not present

## 2017-09-28 DIAGNOSIS — R079 Chest pain, unspecified: Secondary | ICD-10-CM | POA: Diagnosis not present

## 2017-09-28 LAB — BASIC METABOLIC PANEL
Anion gap: 10 (ref 5–15)
BUN: 11 mg/dL (ref 8–23)
CO2: 23 mmol/L (ref 22–32)
CREATININE: 0.74 mg/dL (ref 0.44–1.00)
Calcium: 8.8 mg/dL — ABNORMAL LOW (ref 8.9–10.3)
Chloride: 108 mmol/L (ref 98–111)
GFR calc non Af Amer: >60 mL/min (ref >60)
Glucose, Bld: 136 mg/dL — ABNORMAL HIGH (ref 70–99)
Potassium: 3.7 mmol/L (ref 3.5–5.1)
SODIUM: 141 mmol/L (ref 135–145)

## 2017-09-28 LAB — CBC
HCT: 35.3 % (ref 35.0–47.0)
Hemoglobin: 11.9 g/dL — ABNORMAL LOW (ref 12.0–16.0)
MCH: 29.7 pg (ref 26.0–34.0)
MCHC: 33.7 g/dL (ref 32.0–36.0)
MCV: 88.1 fL (ref 80.0–100.0)
PLATELETS: 255 10*3/uL (ref 150–440)
RBC: 4 MIL/uL (ref 3.80–5.20)
RDW: 13.9 % (ref 11.5–14.5)
WBC: 12.1 10*3/uL — ABNORMAL HIGH (ref 3.6–11.0)

## 2017-09-28 LAB — TROPONIN I

## 2017-09-28 NOTE — ED Triage Notes (Signed)
Pt arrived to triage via ems. Pt with central chest pain with radiation to left jaw and arm since 2130. Pt took 324mg  of asa, ems gave one ntg spray with improvement in pain.

## 2017-09-29 ENCOUNTER — Other Ambulatory Visit: Payer: Self-pay

## 2017-09-29 ENCOUNTER — Observation Stay: Payer: Medicare HMO

## 2017-09-29 ENCOUNTER — Encounter: Payer: Self-pay | Admitting: Internal Medicine

## 2017-09-29 DIAGNOSIS — D72829 Elevated white blood cell count, unspecified: Secondary | ICD-10-CM | POA: Diagnosis not present

## 2017-09-29 DIAGNOSIS — R0789 Other chest pain: Secondary | ICD-10-CM | POA: Diagnosis not present

## 2017-09-29 DIAGNOSIS — R079 Chest pain, unspecified: Secondary | ICD-10-CM | POA: Diagnosis present

## 2017-09-29 DIAGNOSIS — R600 Localized edema: Secondary | ICD-10-CM | POA: Diagnosis not present

## 2017-09-29 DIAGNOSIS — E1165 Type 2 diabetes mellitus with hyperglycemia: Secondary | ICD-10-CM | POA: Diagnosis not present

## 2017-09-29 LAB — TROPONIN I: Troponin I: <0.03 ng/mL (ref <0.03)

## 2017-09-29 LAB — NM MYOCAR MULTI W/SPECT W/WALL MOTION / EF
CHL CUP NUCLEAR SSS: 1
CSEPED: 1 min
CSEPEDS: 32 s
Estimated workload: 1 METS
LVDIAVOL: 86 mL (ref 46–106)
LVSYSVOL: 25 mL
Peak HR: 111 {beats}/min
Rest HR: 64 {beats}/min
SDS: 0
SRS: 5

## 2017-09-29 LAB — GLUCOSE, CAPILLARY
GLUCOSE-CAPILLARY: 121 mg/dL — AB (ref 70–99)
GLUCOSE-CAPILLARY: 105 mg/dL — AB (ref 70–99)

## 2017-09-29 LAB — FIBRIN DERIVATIVES D-DIMER (ARMC ONLY): FIBRIN DERIVATIVES D-DIMER (ARMC): 1500.1 ng{FEU}/mL — AB (ref 0.00–499.00)

## 2017-09-29 MED ORDER — REGADENOSON 0.4 MG/5ML IV SOLN
0.4000 mg | Freq: Once | INTRAVENOUS | Status: AC
  Administered 2017-09-29: 0.4 mg via INTRAVENOUS

## 2017-09-29 MED ORDER — MORPHINE SULFATE (PF) 4 MG/ML IV SOLN: 4.0000 mg | INTRAVENOUS | Status: DC | PRN

## 2017-09-29 MED ORDER — ATORVASTATIN CALCIUM 40 MG PO TABS: ORAL_TABLET | ORAL | 1 refills | Status: DC

## 2017-09-29 MED ORDER — HYDROCODONE-ACETAMINOPHEN 5-325 MG PO TABS
1.0000 | ORAL_TABLET | ORAL | Status: DC | PRN
  Administered 2017-09-29: 1 via ORAL
  Filled 2017-09-29: qty 1

## 2017-09-29 MED ORDER — ASPIRIN EC 81 MG PO TBEC
81.0000 mg | DELAYED_RELEASE_TABLET | Freq: Every day | ORAL | Status: DC
  Administered 2017-09-29: 81 mg via ORAL
  Filled 2017-09-29: qty 1

## 2017-09-29 MED ORDER — PANTOPRAZOLE SODIUM 40 MG PO TBEC
40.0000 mg | DELAYED_RELEASE_TABLET | Freq: Every day | ORAL | Status: DC
  Administered 2017-09-29: 40 mg via ORAL
  Filled 2017-09-29: qty 1

## 2017-09-29 MED ORDER — ONDANSETRON HCL 4 MG/2ML IJ SOLN: 4.0000 mg | Freq: Four times a day (QID) | INTRAMUSCULAR | Status: DC | PRN

## 2017-09-29 MED ORDER — CELECOXIB 100 MG PO CAPS
100.0000 mg | ORAL_CAPSULE | Freq: Two times a day (BID) | ORAL | Status: DC
  Administered 2017-09-29: 100 mg via ORAL
  Filled 2017-09-29 (×2): qty 1

## 2017-09-29 MED ORDER — GABAPENTIN 300 MG PO CAPS
300.0000 mg | ORAL_CAPSULE | Freq: Three times a day (TID) | ORAL | Status: DC
  Administered 2017-09-29: 300 mg via ORAL
  Filled 2017-09-29: qty 1

## 2017-09-29 MED ORDER — TECHNETIUM TC 99M TETROFOSMIN IV KIT
31.8700 | PACK | Freq: Once | INTRAVENOUS | Status: AC | PRN
  Administered 2017-09-29: 31.87 via INTRAVENOUS

## 2017-09-29 MED ORDER — ASPIRIN 81 MG PO TBEC: 81.0000 mg | DELAYED_RELEASE_TABLET | Freq: Every day | ORAL | 0 refills | Status: DC

## 2017-09-29 MED ORDER — ENOXAPARIN SODIUM 40 MG/0.4ML ~~LOC~~ SOLN
40.0000 mg | SUBCUTANEOUS | Status: DC
  Administered 2017-09-29: 40 mg via SUBCUTANEOUS
  Filled 2017-09-29: qty 0.4

## 2017-09-29 MED ORDER — ATORVASTATIN CALCIUM 20 MG PO TABS: 40.0000 mg | ORAL_TABLET | Freq: Every day | ORAL | Status: DC

## 2017-09-29 MED ORDER — MORPHINE SULFATE (PF) 2 MG/ML IV SOLN
2.0000 mg | INTRAVENOUS | Status: DC | PRN
Start: 2017-09-29 — End: 2017-09-29

## 2017-09-29 MED ORDER — LACTATED RINGERS IV SOLN
INTRAVENOUS | Status: DC
  Administered 2017-09-29: 02:00:00 via INTRAVENOUS

## 2017-09-29 MED ORDER — HYDROCHLOROTHIAZIDE 25 MG PO TABS
25.0000 mg | ORAL_TABLET | Freq: Every day | ORAL | Status: DC
  Administered 2017-09-29: 25 mg via ORAL
  Filled 2017-09-29: qty 1

## 2017-09-29 MED ORDER — BISACODYL 5 MG PO TBEC: 5.0000 mg | DELAYED_RELEASE_TABLET | Freq: Every day | ORAL | Status: DC | PRN

## 2017-09-29 MED ORDER — ACETAMINOPHEN 325 MG PO TABS: 650.0000 mg | ORAL_TABLET | ORAL | Status: DC | PRN

## 2017-09-29 MED ORDER — INSULIN ASPART 100 UNIT/ML ~~LOC~~ SOLN: 0.0000 [IU] | Freq: Three times a day (TID) | SUBCUTANEOUS | Status: DC

## 2017-09-29 MED ORDER — ZOLPIDEM TARTRATE 5 MG PO TABS
5.0000 mg | ORAL_TABLET | Freq: Every evening | ORAL | Status: DC | PRN
  Administered 2017-09-29: 5 mg via ORAL
  Filled 2017-09-29: qty 1

## 2017-09-29 MED ORDER — NITROGLYCERIN 0.4 MG SL SUBL: 0.4000 mg | SUBLINGUAL_TABLET | SUBLINGUAL | Status: DC | PRN

## 2017-09-29 MED ORDER — SENNOSIDES-DOCUSATE SODIUM 8.6-50 MG PO TABS: 1.0000 | ORAL_TABLET | Freq: Every evening | ORAL | Status: DC | PRN

## 2017-09-29 MED ORDER — TECHNETIUM TC 99M TETROFOSMIN IV KIT
14.1000 | PACK | Freq: Once | INTRAVENOUS | Status: AC | PRN
  Administered 2017-09-29: 14.1 via INTRAVENOUS

## 2017-09-29 NOTE — H&P (Addendum)
[facility] [place] NAME: Jean Gonzales    MR#:  [id number]  DATE OF BIRTH:  12/16/1955  DATE OF ADMISSION:  09/28/2017  PRIMARY CARE PHYSICIAN: Mar Daring, PA-C   REQUESTING/REFERRING PHYSICIAN: Harvest Dark, MD  CHIEF COMPLAINT:   Chief Complaint  Patient presents with  . Chest Pain    HISTORY OF PRESENT ILLNESS:  Jean Gonzales  is a 62 y.o. female with a known history of obesity, T2NIDDM, HTN, HLD p/w 1d Hx CP. Pt states she had knee surgery ~2.28mo ago, has had leg swelling since, but it generally resolves when she is recumbent. She states for the past 3d she has had B/L leg swelling that has not resolved overnight or w/ recumbence/elevation. She denies pain. She states she has been active. She was in the swimming pool during the day today. She denies exerting herself excessively. She states she was indoors resting @~2000PM (09/28/2017) when she developed L shoulder pain. Pain started to radiate to L chest, then to midchest, then diffusely throughout the chest. Pain was characterized as a pressure, "something sitting on my chest." She endorses associated SOB and LH. She states she started to have neck pain, L jaw pain and pain over the L face radiating behind the L ear. She became diaphoretic. Denies palpitations, N/V, LOC. EMS was called. She took an [id number] (left over from her knee surgery). EMS gave her NTG. Her CP improved. She is pain-free at the time of my assessment in the ED. She is very anxious, but is comfortable and not in acute distress. She states she has not had CP in 10+yrs. She has not had any recent cardiac evaluation. She has never seen a Cardiologist in the past.  PAST MEDICAL HISTORY:   Past Medical History:  Diagnosis Date  . Arthritis    osteoarthritis right knee  . Cancer [facility]) 2014   endometrial CA  . Diabetes mellitus without complication ([place])   . Hyperlipidemia   . Hypertension     PAST  SURGICAL HISTORY:   Past Surgical History:  Procedure Laterality Date  . ABDOMINAL HYSTERECTOMY  2015   cancer  . BREAST BIOPSY Right 2008ish   core  . CHOLECYSTECTOMY  1992  . TONSILLECTOMY  1973  . TONSILLECTOMY    . TOTAL KNEE ARTHROPLASTY Right 07/09/2017   Procedure: TOTAL KNEE ARTHROPLASTY;  Surgeon: Lovell Sheehan, MD;  Location: [facility] ORS;  Service: Orthopedics;  Laterality: Right;    SOCIAL HISTORY:   Social History   Tobacco Use  . Smoking status: Never Smoker  . Smokeless tobacco: Never Used  Substance Use Topics  . Alcohol use: No    Alcohol/week: 0.0 oz    FAMILY HISTORY:   Family History  Problem Relation Age of Onset  . Diabetes Mother   . Cancer Mother   . Heart disease Father   . Breast cancer Neg Hx   . Kidney cancer Neg Hx   . Bladder Cancer Neg Hx     DRUG ALLERGIES:   Allergies  Allergen Reactions  . Iodinated Diagnostic Agents Anaphylaxis  . Iodine Swelling    REVIEW OF SYSTEMS:   Review of Systems  Constitutional: Positive for diaphoresis. Negative for chills, fever, malaise/fatigue and weight loss.  HENT: Negative for congestion, ear pain, hearing loss, nosebleeds, sinus pain, sore throat and tinnitus.   Eyes: Negative for blurred vision, double vision and photophobia.  Respiratory: Positive for shortness of breath. Negative for cough,  hemoptysis, sputum production and wheezing.   Cardiovascular: Positive for chest pain and leg swelling. Negative for palpitations, orthopnea, claudication and PND.  Gastrointestinal: Negative for abdominal pain, blood in stool, constipation, diarrhea, heartburn, melena, nausea and vomiting.  Genitourinary: Negative for dysuria, frequency, hematuria and urgency.  Musculoskeletal: Negative for back pain, joint pain, myalgias and neck pain.  Skin: Negative for itching and rash.  Neurological: Positive for dizziness. Negative for tingling, tremors, sensory change, speech change, focal weakness, seizures,  loss of consciousness, weakness and headaches.  Psychiatric/Behavioral: Negative for memory loss. The patient does not have insomnia.    MEDICATIONS AT HOME:   Prior to Admission medications   Medication Sig Start Date End Date Taking? Authorizing Provider  acetaminophen (TYLENOL) 325 MG tablet Take 1-2 tablets (325-650 mg total) by mouth every 6 (six) hours as needed for mild pain (pain score 1-3 or temp > 100.5). 07/12/17   Lovell Sheehan, MD  aspirin EC 325 MG EC tablet Take 1 tablet (325 mg total) by mouth daily with breakfast. Patient not taking: Reported on 09/26/2017 07/13/17   Lovell Sheehan, MD  atorvastatin (LIPITOR) 40 MG tablet TAKE 1 TABLET BY MOUTH EVERY DAY Patient taking differently: TAKE 1 TABLET (40 MG) BY MOUTH EVERY DAY IN THE MORNING. 06/06/17   Mar Daring, PA-C  celecoxib (CELEBREX) 100 MG capsule TAKE 1 CAPSULE BY MOUTH TWICE A DAY 09/07/17   Mar Daring, PA-C  gabapentin (NEURONTIN) 300 MG capsule Take 1 capsule (300 mg total) by mouth 3 (three) times daily. 07/12/17   Lovell Sheehan, MD  hydrochlorothiazide (HYDRODIURIL) 25 MG tablet Take 25 mg by mouth daily. 08/08/17   [provider]  HYDROcodone-acetaminophen (NORCO/VICODIN) 5-325 MG tablet Take 1-2 tablets by mouth every 4 (four) hours as needed for moderate pain (pain score 4-6). 07/12/17   Lovell Sheehan, MD  NEXIUM 40 MG capsule Take 1 capsule (40 mg total) by mouth daily. Brand preferred by patient Patient taking differently: Take 40 mg by mouth daily before breakfast. Brand preferred by patient 04/30/17   Mar Daring, PA-C  sulfamethoxazole-trimethoprim (BACTRIM DS,SEPTRA DS) 800-160 MG tablet Take 1 tablet by mouth every 12 (twelve) hours. 09/26/17   McGowan, Larene Beach A, PA-C  zolpidem (AMBIEN) 10 MG tablet Take 1 tablet (10 mg total) by mouth at bedtime as needed for sleep. 09/05/17   Mar Daring, PA-C      VITAL SIGNS:  Blood pressure 115/87, pulse 74, temperature 97.9  F (36.6 C), temperature source Oral, resp. rate 12, height 5\' 8"  (1.727 m), weight 112.5 kg (248 lb), SpO2 100 %.  PHYSICAL EXAMINATION:  Physical Exam  Constitutional: She is oriented to person, place, and time. She appears well-developed and well-nourished. She is active and cooperative.  Non-toxic appearance. She does not have a sickly appearance. She does not appear ill. No distress. She is not intubated.  HENT:  Head: Normocephalic and atraumatic.  Mouth/Throat: Oropharynx is clear and moist. Mucous membranes are not dry. No oropharyngeal exudate.  Eyes: Conjunctivae, EOM and lids are normal. No scleral icterus.  Neck: Neck supple. No JVD present. No thyromegaly present.  Cardiovascular: Normal rate, regular rhythm, S1 normal, S2 normal and normal heart sounds.  No extrasystoles are present. Exam reveals no gallop, no S3, no S4, no distant heart sounds and no friction rub.  No murmur heard. Pulmonary/Chest: Effort normal and breath sounds normal. No accessory muscle usage or stridor. No apnea, no tachypnea and no bradypnea. She  is not intubated. No respiratory distress. She has no decreased breath sounds. She has no wheezes. She has no rhonchi. She has no rales. She exhibits no tenderness.  Abdominal: Soft. Bowel sounds are normal. She exhibits no distension. There is no tenderness. There is no rebound and no guarding.  Musculoskeletal: Normal range of motion. She exhibits no edema or tenderness.       Right lower leg: Normal. She exhibits no tenderness. Right lower leg edema: Mild B/L LE non-pitting edema.       Left lower leg: Normal. She exhibits no tenderness. Left lower leg edema: Mild B/L LE non-pitting edema.  Lymphadenopathy:    She has no cervical adenopathy.  Neurological: She is alert and oriented to person, place, and time. She is not disoriented.  Skin: Skin is warm and dry. She is not diaphoretic. There is erythema. No pallor.  Psychiatric: Her speech is normal and behavior  is normal. Judgment and thought content normal. Her mood appears anxious. Her affect is not angry and not inappropriate. She is not agitated, not aggressive, not hyperactive and not combative. Cognition and memory are normal.   LABORATORY PANEL:   CBC Recent Labs  Lab 09/28/17 2225  WBC 12.1*  HGB 11.9*  HCT 35.3  PLT 255   ------------------------------------------------------------------------------------------------------------------  Chemistries  Recent Labs  Lab 09/28/17 2225  NA 141  K 3.7  CL 108  CO2 23  GLUCOSE 136*  BUN 11  CREATININE 0.74  CALCIUM 8.8*   ------------------------------------------------------------------------------------------------------------------  Cardiac Enzymes Recent Labs  Lab 09/28/17 2225  TROPONINI <0.03   ------------------------------------------------------------------------------------------------------------------  RADIOLOGY:  Dg Chest 2 View  Result Date: 09/28/2017 CLINICAL DATA:  Acute chest pain today. EXAM: CHEST - 2 VIEW COMPARISON:  01/25/2007 and prior radiographs FINDINGS: The cardiomediastinal silhouette is unremarkable. Mild elevation of the RIGHT hemidiaphragm again noted. There is no evidence of focal airspace disease, pulmonary edema, suspicious pulmonary nodule/mass, pleural effusion, or pneumothorax. No acute bony abnormalities are identified. IMPRESSION: No active cardiopulmonary disease. Electronically Signed   By: Margarette Canada M.D.   On: 09/28/2017 22:51   IMPRESSION AND PLAN:   A/P: 63F CP w/ multiple risk factors, B/L foot swelling (non-pitting). Also hyperglycemia (w/ T2NIDDM), leukocytosis, normocytic anemia. -Not on regular daily ASA @ home (was previously taking 325mg  PRN knee pain s/p surgery) -CXR (-) acute cardiopulmonary disease -EKG (-) acute ST/TW changes -Trop-I (-) x1, 2x rpt pending -Tele, continuous cardiac monitoring -ASA81 -Morphine, NTG, O2 PRN -NPO, non-ambulating nuclear  pharmacological stress test -B/L LE venous duplex U/S -c/w statin -Not on beta blocker or ACE-I/ARB -SSI -Leukocytosis mild, likely reactive -Mild normocytic anemia, likely anemia of chronic disease, no evidence of acute blood loss -c/w home meds/formulary subs -NPO for stress -Lovenox -Full code -Observation, < 2 midnights  Addendum: D-Dimer ordered in ED is (+) @ > 1500. This generates a conundrum for me. Pt's CP was non-pleuritic, and she is non-tachycardic, non-tachypneic and non-hypoxic, w/o EKG changes. She has mild non-pitting edema of the feet, but denies leg/calf/foot pain and is not tender. I have a low clinical suspicion for DVT/PE. Pt is ordered for B/L LE venous duplex U/S to evaluate for cause of swelling, though my suspicion for DVT is low. The pt's Wells score is zero, per my calculation, which portends a 1.3% risk of PE in an ED population. Clinically, I would not expect a pt suffering from an acute pulmonary embolism to have no change in his or her vitals/physiology. Based on my assessment,  I would have not ordered a D-Dimer to begin with. As such, I am at present not planning to follow this result up with chest imaging.   All the records are reviewed and case discussed with ED provider. Management plans discussed with the patient, family and they are in agreement.  CODE STATUS: Full code  TOTAL TIME TAKING CARE OF THIS PATIENT: 60 minutes.    Arta Silence M.D on 09/29/2017 at 1:04 AM  Between 7am to 6pm - Pager - [phone]  After 6pm go to [email or web address] - password [email or web address] [facility]  [facility]  Office  [phone]  CC: Primary care physician; Mar Daring, PA-C   Note: This dictation was prepared with Dragon dictation along with smaller phrase technology. Any transcriptional errors that result from this process are unintentional.

## 2017-09-29 NOTE — Consult Note (Signed)
Jean Gonzales is a 62 y.o. female  [id number]  Primary Cardiologist: Neoma Laming Reason for Consultation: chest pain  HPI: Patient had some chest pain during lexiscan infusion lasted 2 minutes.   Review of Systems: No orthopnea   Past Medical History:  Diagnosis Date  . Arthritis    osteoarthritis right knee  . Cancer [facility]) 2014   endometrial CA  . Diabetes mellitus without complication ([place])   . Hyperlipidemia   . Hypertension     Medications Prior to Admission  Medication Sig Dispense Refill  . acetaminophen (TYLENOL) 325 MG tablet Take 1-2 tablets (325-650 mg total) by mouth every 6 (six) hours as needed for mild pain (pain score 1-3 or temp > 100.5). 30 tablet 0  . aspirin EC 325 MG EC tablet Take 1 tablet (325 mg total) by mouth daily with breakfast. (Patient not taking: Reported on 09/26/2017) 30 tablet 0  . atorvastatin (LIPITOR) 40 MG tablet TAKE 1 TABLET BY MOUTH EVERY DAY (Patient taking differently: TAKE 1 TABLET (40 MG) BY MOUTH EVERY DAY IN THE MORNING.) 90 tablet 1  . celecoxib (CELEBREX) 100 MG capsule TAKE 1 CAPSULE BY MOUTH TWICE A DAY 60 capsule 5  . gabapentin (NEURONTIN) 300 MG capsule Take 1 capsule (300 mg total) by mouth 3 (three) times daily. 60 capsule 0  . hydrochlorothiazide (HYDRODIURIL) 25 MG tablet Take 25 mg by mouth daily.  1  . HYDROcodone-acetaminophen (NORCO/VICODIN) 5-325 MG tablet Take 1-2 tablets by mouth every 4 (four) hours as needed for moderate pain (pain score 4-6). 30 tablet 0  . NEXIUM 40 MG capsule Take 1 capsule (40 mg total) by mouth daily. Brand preferred by patient (Patient taking differently: Take 40 mg by mouth daily before breakfast. Brand preferred by patient) 30 capsule 3  . sulfamethoxazole-trimethoprim (BACTRIM DS,SEPTRA DS) 800-160 MG tablet Take 1 tablet by mouth every 12 (twelve) hours. 14 tablet 0  . zolpidem (AMBIEN) 10 MG tablet Take 1 tablet (10 mg total) by mouth at bedtime as needed for sleep. 30 tablet 5      . aspirin EC  81 mg Oral Daily  . atorvastatin  40 mg Oral Daily  . celecoxib  100 mg Oral BID  . enoxaparin (LOVENOX) injection  40 mg Subcutaneous Q24H  . gabapentin  300 mg Oral TID  . hydrochlorothiazide  25 mg Oral Daily  . insulin aspart  0-15 Units Subcutaneous TID WC  . pantoprazole  40 mg Oral Daily    Infusions:   Allergies  Allergen Reactions  . Iodinated Diagnostic Agents Anaphylaxis  . Iodine Swelling    Social History   Socioeconomic History  . Marital status: Divorced    Spouse name: Not on file  . Number of children: Not on file  . Years of education: Not on file  . Highest education level: Not on file  Occupational History  . Not on file  Social Needs  . Financial resource strain: Not on file  . Food insecurity:    Worry: Not on file    Inability: Not on file  . Transportation needs:    Medical: Not on file    Non-medical: Not on file  Tobacco Use  . Smoking status: Never Smoker  . Smokeless tobacco: Never Used  Substance and Sexual Activity  . Alcohol use: No    Alcohol/week: 0.0 oz  . Drug use: No  . Sexual activity: Not Currently  Lifestyle  . Physical activity:    Days  per week: Not on file    Minutes per session: Not on file  . Stress: Not on file  Relationships  . Social connections:    Talks on phone: Not on file    Gets together: Not on file    Attends religious service: Not on file    Active member of club or organization: Not on file    Attends meetings of clubs or organizations: Not on file    Relationship status: Not on file  . Intimate partner violence:    Fear of current or ex partner: Not on file    Emotionally abused: Not on file    Physically abused: Not on file    Forced sexual activity: Not on file  Other Topics Concern  . Not on file  Social History Narrative  . Not on file    Family History  Problem Relation Age of Onset  . Diabetes Mother   . Cancer Mother   . Heart disease Father   . Breast  cancer Neg Hx   . Kidney cancer Neg Hx   . Bladder Cancer Neg Hx     PHYSICAL EXAM: Vitals:   09/29/17 0640 09/29/17 1210  BP: 122/80 (!) 147/84  Pulse: 72 64  Resp:  14  Temp: 97.8 F (36.6 C)   SpO2: 99% 100%     Intake/Output Summary (Last 24 hours) at 09/29/2017 1241 Last data filed at 09/29/2017 0646 Gross per 24 hour  Intake 256.25 ml  Output 150 ml  Net 106.25 ml    General:  Well appearing. No respiratory difficulty HEENT: normal Neck: supple. no JVD. Carotids 2+ bilat; no bruits. No lymphadenopathy or thryomegaly appreciated. Cor: PMI nondisplaced. Regular rate & rhythm. No rubs, gallops or murmurs. Lungs: clear Abdomen: soft, nontender, nondistended. No hepatosplenomegaly. No bruits or masses. Good bowel sounds. Extremities: no cyanosis, clubbing, rash, edema Neuro: alert & oriented x 3, cranial nerves grossly intact. moves all 4 extremities w/o difficulty. Affect pleasant.  ECG: NSR no acute changes  Results for orders placed or performed during the hospital encounter of 09/28/17 (from the past 24 hour(s))  Basic metabolic panel     Status: Abnormal   Collection Time: 09/28/17 10:25 PM  Result Value Ref Range   Sodium 141 135 - 145 mmol/L   Potassium 3.7 3.5 - 5.1 mmol/L   Chloride 108 98 - 111 mmol/L   CO2 23 22 - 32 mmol/L   Glucose, Bld 136 (H) 70 - 99 mg/dL   BUN 11 8 - 23 mg/dL   Creatinine, Ser 0.74 0.44 - 1.00 mg/dL   Calcium 8.8 (L) 8.9 - 10.3 mg/dL   GFR calc non Af Amer >60 >60 mL/min   GFR calc Af Amer >60 >60 mL/min   Anion gap 10 5 - 15  CBC     Status: Abnormal   Collection Time: 09/28/17 10:25 PM  Result Value Ref Range   WBC 12.1 (H) 3.6 - 11.0 K/uL   RBC 4.00 3.80 - 5.20 MIL/uL   Hemoglobin 11.9 (L) 12.0 - 16.0 g/dL   HCT 35.3 35.0 - 47.0 %   MCV 88.1 80.0 - 100.0 fL   MCH 29.7 26.0 - 34.0 pg   MCHC 33.7 32.0 - 36.0 g/dL   RDW 13.9 11.5 - 14.5 %   Platelets 255 150 - 440 K/uL  Troponin I     Status: None   Collection Time:  09/28/17 10:25 PM  Result Value Ref Range   Troponin  I <0.03 <0.03 ng/mL  Fibrin derivatives D-Dimer ([facility] only)     Status: Abnormal   Collection Time: 09/28/17 10:25 PM  Result Value Ref Range   Fibrin derivatives D-dimer ([facility]) 1,500.10 (H) 0.00 - 499.00 ng/mL (FEU)  Troponin I     Status: None   Collection Time: 09/29/17  1:46 AM  Result Value Ref Range   Troponin I <0.03 <0.03 ng/mL  Glucose, capillary     Status: Abnormal   Collection Time: 09/29/17  1:53 AM  Result Value Ref Range   Glucose-Capillary 121 (H) 70 - 99 mg/dL  Troponin I     Status: None   Collection Time: 09/29/17  5:52 AM  Result Value Ref Range   Troponin I <0.03 <0.03 ng/mL  Glucose, capillary     Status: Abnormal   Collection Time: 09/29/17 12:10 PM  Result Value Ref Range   Glucose-Capillary 105 (H) 70 - 99 mg/dL   Comment 1 Notify RN    Dg Chest 2 View  Result Date: 09/28/2017 CLINICAL DATA:  Acute chest pain today. EXAM: CHEST - 2 VIEW COMPARISON:  01/25/2007 and prior radiographs FINDINGS: The cardiomediastinal silhouette is unremarkable. Mild elevation of the RIGHT hemidiaphragm again noted. There is no evidence of focal airspace disease, pulmonary edema, suspicious pulmonary nodule/mass, pleural effusion, or pneumothorax. No acute bony abnormalities are identified. IMPRESSION: No active cardiopulmonary disease. Electronically Signed   By: Margarette Canada M.D.   On: 09/28/2017 22:51   Nm Myocar Multi W/spect W/wall Motion / Ef  Result Date: 09/29/2017  Defect 1: There is a defect present in the apex location.  This is a low risk study.  The left ventricular ejection fraction is normal (55-65%).    US Venous Img Lower Bilateral  Result Date: 09/29/2017 CLINICAL DATA:  62 year old female with a history of edema EXAM: BILATERAL LOWER EXTREMITY VENOUS DOPPLER ULTRASOUND TECHNIQUE: Gray-scale sonography with graded compression, as well as color Doppler and duplex ultrasound were performed to evaluate the lower  extremity deep venous systems from the level of the common femoral vein and including the common femoral, femoral, profunda femoral, popliteal and calf veins including the posterior tibial, peroneal and gastrocnemius veins when visible. The superficial great saphenous vein was also interrogated. Spectral Doppler was utilized to evaluate flow at rest and with distal augmentation maneuvers in the common femoral, femoral and popliteal veins. COMPARISON:  None. FINDINGS: RIGHT LOWER EXTREMITY Common Femoral Vein: No evidence of thrombus. Normal compressibility, respiratory phasicity and response to augmentation. Saphenofemoral Junction: No evidence of thrombus. Normal compressibility and flow on color Doppler imaging. Profunda Femoral Vein: No evidence of thrombus. Normal compressibility and flow on color Doppler imaging. Femoral Vein: No evidence of thrombus. Normal compressibility, respiratory phasicity and response to augmentation. Popliteal Vein: No evidence of thrombus. Normal compressibility, respiratory phasicity and response to augmentation. Calf Veins: No evidence of thrombus. Normal compressibility and flow on color Doppler imaging. Superficial Great Saphenous Vein: No evidence of thrombus. Normal compressibility and flow on color Doppler imaging. Other Findings:  None. LEFT LOWER EXTREMITY Common Femoral Vein: No evidence of thrombus. Normal compressibility, respiratory phasicity and response to augmentation. Saphenofemoral Junction: No evidence of thrombus. Normal compressibility and flow on color Doppler imaging. Profunda Femoral Vein: No evidence of thrombus. Normal compressibility and flow on color Doppler imaging. Femoral Vein: No evidence of thrombus. Normal compressibility, respiratory phasicity and response to augmentation. Popliteal Vein: No evidence of thrombus. Normal compressibility, respiratory phasicity and response to augmentation. Calf Veins: No evidence of  thrombus. Normal compressibility and  flow on color Doppler imaging. Superficial Great Saphenous Vein: No evidence of thrombus. Normal compressibility and flow on color Doppler imaging. Other Findings:  None. IMPRESSION: Sonographic survey of the bilateral lower extremities negative for DVT Electronically Signed   By: Corrie Mckusick D.O.   On: 09/29/2017 11:58     ASSESSMENT AND PLAN: Chest pain with dye allergy. Stress test equivacal with normal LVEF. Since D-dimer elevated do VQ scan prior to discharge even though u/s legs has no DVT. May f/u Monday 9:30 AM if VQ san ok.  Khameron Gruenwald A

## 2017-09-29 NOTE — ED Notes (Signed)
ED Provider at bedside. 

## 2017-09-29 NOTE — Care Management Note (Signed)
Case Management Note  Patient Details  Name: Jean Gonzales MRN: [id number] Date of Birth: [birth year]  Subjective/Objective:  Patient admitted to [facility] under observation status for chest pain. RNCM consulted on patient to provide MOON letter and complete assessment. Patient currently lives with significant other and her sister Jean Gonzales [phone]) 15- 31 is available for support. Patient is able to complete all activities of daily living independently. Uses no DME. [facility] about 3 months ago for knee replacement surgery. Still drives. PCP  Jean Daring, PA-C with whom she follows regularly. Uses [facility] in [place] and has no issues obtaining medications.                    Action/Plan: RNCM to continue to follow for any needs.   Expected Discharge Date:  09/29/17               Expected Discharge Plan:     In-House Referral:     Discharge planning Services     Post Acute Care Choice:    Choice offered to:     DME Arranged:    DME Agency:     HH Arranged:    HH Agency:     Status of Service:     If discussed at [employer], dates discussed:    Additional Comments:  Jean Gonzales Jean Norgaard, RN 09/29/2017, 1:09 PM

## 2017-09-29 NOTE — Discharge Summary (Signed)
[facility] NAME: Jean Gonzales    MR#:  [id number]  DATE OF BIRTH:  1955/07/21  DATE OF ADMISSION:  09/28/2017 ADMITTING PHYSICIAN: Arta Silence, MD  DATE OF DISCHARGE: 09/29/2017  PRIMARY CARE PHYSICIAN: Mar Daring, PA-C    ADMISSION DIAGNOSIS:  Bilateral lower extremity edema [R60.0] Chest pain, unspecified type [R07.9]  DISCHARGE DIAGNOSIS:  Chest pain unclear etio w/u negative  SECONDARY DIAGNOSIS:   Past Medical History:  Diagnosis Date  . Arthritis    osteoarthritis right knee  . Cancer [facility]) 2014   endometrial CA  . Diabetes mellitus without complication ([place])   . Hyperlipidemia   . Hypertension     HOSPITAL COURSE:  Jean Gonzales  is a 62 y.o. female with a known history of obesity, T2NIDDM, HTN, HLD p/w 1d Hx CP  1. chest pain unclear etiology. Workup so far negative. Three sets of cardiac enzymes negative. EKG no acute ST elevation her depression. -IV stress test loaded study. EF more than 50%.\ -aspirin 81 mg recommended. Patient will follow-up with Dr. Yancey Flemings as outpatient -no known cardiac history. -Came in after she had left shoulder pain which related to left mid substernal area up to the jaw and felt like her jaw was clenching. No recurrence of symptoms. She feels absolutely fine back to normal. No pleuritic chest pain. No shortness of breath. -Bilateral lower extremity Doppler ultrasound negative. -Discussed with patient and family.  2. Diabetes carb control diet and sliding scale insulin. -pt is not on any medications at home  3.HL cont statins  Patient is anxious to go home. Discussed with patient and family.  CONSULTS OBTAINED:  Treatment Team:  Arta Silence, MD Dionisio David, MD  DRUG ALLERGIES:   Allergies  Allergen Reactions  . Iodinated Diagnostic Agents Anaphylaxis  . Iodine Swelling    DISCHARGE MEDICATIONS:   Allergies as of 09/29/2017    Reactions   Iodinated Diagnostic Agents Anaphylaxis   Iodine Swelling      Medication List    TAKE these medications   acetaminophen 325 MG tablet Commonly known as:  TYLENOL Take 1-2 tablets (325-650 mg total) by mouth every 6 (six) hours as needed for mild pain (pain score 1-3 or temp > 100.5).   aspirin 81 MG EC tablet Take 1 tablet (81 mg total) by mouth daily. What changed:    medication strength  how much to take  when to take this   atorvastatin 40 MG tablet Commonly known as:  LIPITOR TAKE 1 TABLET (40 MG) BY MOUTH EVERY DAY IN THE MORNING. What changed:    how much to take  how to take this  when to take this  additional instructions   celecoxib 100 MG capsule Commonly known as:  CELEBREX TAKE 1 CAPSULE BY MOUTH TWICE A DAY   gabapentin 300 MG capsule Commonly known as:  NEURONTIN Take 1 capsule (300 mg total) by mouth 3 (three) times daily.   hydrochlorothiazide 25 MG tablet Commonly known as:  HYDRODIURIL Take 25 mg by mouth daily.   HYDROcodone-acetaminophen 5-325 MG tablet Commonly known as:  NORCO/VICODIN Take 1-2 tablets by mouth every 4 (four) hours as needed for moderate pain (pain score 4-6).   NEXIUM 40 MG capsule Generic drug:  esomeprazole Take 1 capsule (40 mg total) by mouth daily. Brand preferred by patient What changed:    when to take this  additional instructions   sulfamethoxazole-trimethoprim 800-160 MG tablet Commonly  known as:  BACTRIM DS,SEPTRA DS Take 1 tablet by mouth every 12 (twelve) hours.   zolpidem 10 MG tablet Commonly known as:  AMBIEN Take 1 tablet (10 mg total) by mouth at bedtime as needed for sleep.       If you experience worsening of your admission symptoms, develop shortness of breath, life threatening emergency, suicidal or homicidal thoughts you must seek medical attention immediately by calling 911 or calling your MD immediately  if symptoms less severe.  You Must read complete  instructions/literature along with all the possible adverse reactions/side effects for all the Medicines you take and that have been prescribed to you. Take any new Medicines after you have completely understood and accept all the possible adverse reactions/side effects.   Please note  You were cared for by a hospitalist during your hospital stay. If you have any questions about your discharge medications or the care you received while you were in the hospital after you are discharged, you can call the unit and asked to speak with the hospitalist on call if the hospitalist that took care of you is not available. Once you are discharged, your primary care physician will handle any further medical issues. Please note that NO REFILLS for any discharge medications will be authorized once you are discharged, as it is imperative that you return to your primary care physician (or establish a relationship with a primary care physician if you do not have one) for your aftercare needs so that they can reassess your need for medications and monitor your lab values. Today   SUBJECTIVE   No chest pain or shortness of breath.  VITAL SIGNS:  Blood pressure (!) 147/84, pulse 64, temperature 97.8 F (36.6 C), temperature source Oral, resp. rate 14, height 5\' 8"  (1.727 m), weight 112.8 kg (248 lb 11.2 oz), SpO2 100 %.  I/O:    Intake/Output Summary (Last 24 hours) at 09/29/2017 1300 Last data filed at 09/29/2017 0646 Gross per 24 hour  Intake 256.25 ml  Output 150 ml  Net 106.25 ml    PHYSICAL EXAMINATION:  GENERAL:  62 y.o.-year-old patient lying in the bed with no acute distress.  EYES: Pupils equal, round, reactive to light and accommodation. No scleral icterus. Extraocular muscles intact.  HEENT: Head atraumatic, normocephalic. Oropharynx and nasopharynx clear.  NECK:  Supple, no jugular venous distention. No thyroid enlargement, no tenderness.  LUNGS: Normal breath sounds bilaterally, no wheezing,  rales,rhonchi or crepitation. No use of accessory muscles of respiration.  CARDIOVASCULAR: S1, S2 normal. No murmurs, rubs, or gallops.  ABDOMEN: Soft, non-tender, non-distended. Bowel sounds present. No organomegaly or mass.  EXTREMITIES: No pedal edema, cyanosis, or clubbing.  NEUROLOGIC: Cranial nerves II through XII are intact. Muscle strength 5/5 in all extremities. Sensation intact. Gait not checked.  PSYCHIATRIC: The patient is alert and oriented x 3.  SKIN: No obvious rash, lesion, or ulcer.   DATA REVIEW:   CBC  Recent Labs  Lab 09/28/17 2225  WBC 12.1*  HGB 11.9*  HCT 35.3  PLT 255    Chemistries  Recent Labs  Lab 09/28/17 2225  NA 141  K 3.7  CL 108  CO2 23  GLUCOSE 136*  BUN 11  CREATININE 0.74  CALCIUM 8.8*    Microbiology Results   No results found for this or any previous visit (from the past 240 hour(s)).  RADIOLOGY:  Dg Chest 2 View  Result Date: 09/28/2017 CLINICAL DATA:  Acute chest pain today. EXAM: CHEST -  2 VIEW COMPARISON:  01/25/2007 and prior radiographs FINDINGS: The cardiomediastinal silhouette is unremarkable. Mild elevation of the RIGHT hemidiaphragm again noted. There is no evidence of focal airspace disease, pulmonary edema, suspicious pulmonary nodule/mass, pleural effusion, or pneumothorax. No acute bony abnormalities are identified. IMPRESSION: No active cardiopulmonary disease. Electronically Signed   By: Margarette Canada M.D.   On: 09/28/2017 22:51   Nm Myocar Multi W/spect W/wall Motion / Ef  Result Date: 09/29/2017  Defect 1: There is a defect present in the apex location.  This is a low risk study.  The left ventricular ejection fraction is normal (55-65%).    US Venous Img Lower Bilateral  Result Date: 09/29/2017 CLINICAL DATA:  62 year old female with a history of edema EXAM: BILATERAL LOWER EXTREMITY VENOUS DOPPLER ULTRASOUND TECHNIQUE: Gray-scale sonography with graded compression, as well as color Doppler and duplex ultrasound  were performed to evaluate the lower extremity deep venous systems from the level of the common femoral vein and including the common femoral, femoral, profunda femoral, popliteal and calf veins including the posterior tibial, peroneal and gastrocnemius veins when visible. The superficial great saphenous vein was also interrogated. Spectral Doppler was utilized to evaluate flow at rest and with distal augmentation maneuvers in the common femoral, femoral and popliteal veins. COMPARISON:  None. FINDINGS: RIGHT LOWER EXTREMITY Common Femoral Vein: No evidence of thrombus. Normal compressibility, respiratory phasicity and response to augmentation. Saphenofemoral Junction: No evidence of thrombus. Normal compressibility and flow on color Doppler imaging. Profunda Femoral Vein: No evidence of thrombus. Normal compressibility and flow on color Doppler imaging. Femoral Vein: No evidence of thrombus. Normal compressibility, respiratory phasicity and response to augmentation. Popliteal Vein: No evidence of thrombus. Normal compressibility, respiratory phasicity and response to augmentation. Calf Veins: No evidence of thrombus. Normal compressibility and flow on color Doppler imaging. Superficial Great Saphenous Vein: No evidence of thrombus. Normal compressibility and flow on color Doppler imaging. Other Findings:  None. LEFT LOWER EXTREMITY Common Femoral Vein: No evidence of thrombus. Normal compressibility, respiratory phasicity and response to augmentation. Saphenofemoral Junction: No evidence of thrombus. Normal compressibility and flow on color Doppler imaging. Profunda Femoral Vein: No evidence of thrombus. Normal compressibility and flow on color Doppler imaging. Femoral Vein: No evidence of thrombus. Normal compressibility, respiratory phasicity and response to augmentation. Popliteal Vein: No evidence of thrombus. Normal compressibility, respiratory phasicity and response to augmentation. Calf Veins: No evidence of  thrombus. Normal compressibility and flow on color Doppler imaging. Superficial Great Saphenous Vein: No evidence of thrombus. Normal compressibility and flow on color Doppler imaging. Other Findings:  None. IMPRESSION: Sonographic survey of the bilateral lower extremities negative for DVT Electronically Signed   By: Corrie Mckusick D.O.   On: 09/29/2017 11:58     Management plans discussed with the patient, family and they are in agreement.  CODE STATUS:     Code Status Orders  (From admission, onward)        Start     Ordered   09/29/17 0116  Full code  Continuous     09/29/17 0115    Code Status History    Date Active Date Inactive Code Status Order ID Comments User Context   07/09/2017 1200 07/16/2017 1653 Full Code [id number]  Lovell Sheehan, MD Inpatient      TOTAL TIME TAKING CARE OF THIS PATIENT: *27minutes.    Fritzi Mandes M.D on 09/29/2017 at 1:00 PM  Between 7am to 6pm - Pager - [phone] After 6pm go to [email or web address] -  password [email or web address] [facility]  Office  [phone]  CC: Primary care physician; Mar Daring, PA-C

## 2017-09-29 NOTE — ED Provider Notes (Signed)
[facility] Emergency Department Provider Note  Time seen: 12:05 AM  I have reviewed the triage vital signs and the nursing notes.   HISTORY  Chief Complaint Chest Pain    HPI Jean Gonzales is a 62 y.o. female with a past medical history of hypertension, hyperlipidemia, diabetes, 70-month status post right knee replacement presents to the emergency department for chest pain.  According to the patient she was feeling well all day today until approximately 9:30 PM tonight when she developed pain in her left chest which then progressed to the center of her chest and into her left jaw.  Patient states she became dizzy and diaphoretic, denies any nausea but states she was somewhat short of breath.  Patient denies any leg pain, had a right total knee replacement 3 months ago.  States all of her swelling had gone down but over the past 3 days she has been experiencing lower extremity edema in both of her legs which is never occurred previously.  Does not use any diuretic.  Patient states she took aspirin at home, and the pain began to dissipate after 20 minutes or so.  Patient got a spray of nitroglycerin by EMS and states the pain is nearly gone at this time with very slight discomfort in the center of her chest.  Denies any pleuritic chest pain.  Denies any fever cough or congestion.  Patient states she has had chest discomfort several times in the past, but is been several years, but is never had a stress test or seen a cardiologist.   Past Medical History:  Diagnosis Date  . Arthritis    osteoarthritis right knee  . Cancer [facility]) 2014   endometrial CA  . Diabetes mellitus without complication ([place])   . Hyperlipidemia   . Hypertension     Patient Active Problem List   Diagnosis Date Noted  . History of total knee arthroplasty 08/01/2017  . Osteoarthritis of right knee 07/09/2017  . Osteoarthritis 05/07/2017  . Synovitis of knee 12/29/2016  . Knee joint  effusion 12/29/2016  . Malignant neoplasm of endometrium ([place]) 02/22/2015  . Acid reflux 02/22/2015  . History of migraine headaches 02/22/2015  . HLD (hyperlipidemia) 02/22/2015  . BP (high blood pressure) 02/22/2015  . History of colon polyps 02/22/2015  . Urinary incontinence 02/22/2015  . Psoriatic arthritis ([place]) 02/22/2015  . Diabetes mellitus ([place]) 10/06/2014  . Urethral stenosis 12/03/2013  . Rheumatoid arthritis ([place]) 03/14/2013    Past Surgical History:  Procedure Laterality Date  . ABDOMINAL HYSTERECTOMY  2015   cancer  . BREAST BIOPSY Right 2008ish   core  . CHOLECYSTECTOMY  1992  . TONSILLECTOMY  1973  . TONSILLECTOMY    . TOTAL KNEE ARTHROPLASTY Right 07/09/2017   Procedure: TOTAL KNEE ARTHROPLASTY;  Surgeon: Lovell Sheehan, MD;  Location: [facility] ORS;  Service: Orthopedics;  Laterality: Right;    Prior to Admission medications   Medication Sig Start Date End Date Taking? Authorizing Provider  acetaminophen (TYLENOL) 325 MG tablet Take 1-2 tablets (325-650 mg total) by mouth every 6 (six) hours as needed for mild pain (pain score 1-3 or temp > 100.5). 07/12/17   Lovell Sheehan, MD  aspirin EC 325 MG EC tablet Take 1 tablet (325 mg total) by mouth daily with breakfast. Patient not taking: Reported on 09/26/2017 07/13/17   Lovell Sheehan, MD  atorvastatin (LIPITOR) 40 MG tablet TAKE 1 TABLET BY MOUTH EVERY DAY Patient taking differently: TAKE 1 TABLET (40  MG) BY MOUTH EVERY DAY IN THE MORNING. 06/06/17   Mar Daring, PA-C  celecoxib (CELEBREX) 100 MG capsule TAKE 1 CAPSULE BY MOUTH TWICE A DAY 09/07/17   Mar Daring, PA-C  gabapentin (NEURONTIN) 300 MG capsule Take 1 capsule (300 mg total) by mouth 3 (three) times daily. 07/12/17   Lovell Sheehan, MD  hydrochlorothiazide (HYDRODIURIL) 25 MG tablet Take 25 mg by mouth daily. 08/08/17   [provider]  HYDROcodone-acetaminophen (NORCO/VICODIN) 5-325 MG tablet Take 1-2 tablets by mouth every 4 (four)  hours as needed for moderate pain (pain score 4-6). 07/12/17   Lovell Sheehan, MD  NEXIUM 40 MG capsule Take 1 capsule (40 mg total) by mouth daily. Brand preferred by patient Patient taking differently: Take 40 mg by mouth daily before breakfast. Brand preferred by patient 04/30/17   Mar Daring, PA-C  sulfamethoxazole-trimethoprim (BACTRIM DS,SEPTRA DS) 800-160 MG tablet Take 1 tablet by mouth every 12 (twelve) hours. 09/26/17   McGowan, Larene Beach A, PA-C  zolpidem (AMBIEN) 10 MG tablet Take 1 tablet (10 mg total) by mouth at bedtime as needed for sleep. 09/05/17   Mar Daring, PA-C    Allergies  Allergen Reactions  . Iodinated Diagnostic Agents Anaphylaxis  . Iodine Swelling    Family History  Problem Relation Age of Onset  . Diabetes Mother   . Cancer Mother   . Heart disease Father   . Breast cancer Neg Hx   . Kidney cancer Neg Hx   . Bladder Cancer Neg Hx     Social History Social History   Tobacco Use  . Smoking status: Never Smoker  . Smokeless tobacco: Never Used  Substance Use Topics  . Alcohol use: No    Alcohol/week: 0.0 oz  . Drug use: No    Review of Systems Constitutional: Negative for fever. Eyes: Negative for visual complaints ENT: Negative for recent illness/congestion Cardiovascular: Positive for chest pain Respiratory: Mild shortness of breath now resolved Gastrointestinal: Negative for abdominal pain, vomiting  Genitourinary: Negative for urinary compaints Musculoskeletal: Negative for musculoskeletal complaints Skin: Positive for diaphoresis, now resolved Neurological: Negative for headache All other ROS negative  ____________________________________________   PHYSICAL EXAM:  VITAL SIGNS: ED Triage Vitals  Enc Vitals Group     BP 09/28/17 2222 140/75     Pulse Rate 09/28/17 2222 76     Resp 09/28/17 2222 20     Temp 09/28/17 2222 97.9 F (36.6 C)     Temp Source 09/28/17 2222 Oral     SpO2 09/28/17 2222 100 %     Weight  09/28/17 2221 248 lb (112.5 kg)     Height 09/28/17 2221 5\' 8"  (1.727 m)     Head Circumference --      Peak Flow --      Pain Score 09/28/17 2221 3     Pain Loc --      Pain Edu? --      Excl. in [place]? --    Constitutional: Alert and oriented. Well appearing and in no distress. Eyes: Normal exam ENT   Head: Normocephalic and atraumatic.   Mouth/Throat: Mucous membranes are moist. Cardiovascular: Normal rate, regular rhythm. No murmur Respiratory: Normal respiratory effort without tachypnea nor retractions. Breath sounds are clea Gastrointestinal: Soft and nontender. No distention.  Musculoskeletal: Nontender with normal range of motion in all extremities.  1+ lower extremity edema/pedal edema bilaterally.  No calf tenderness. Neurologic:  Normal speech and language. No gross focal  neurologic deficits Skin:  Skin is warm, dry and intact.  Psychiatric: Mood and affect are normal.   ____________________________________________    EKG  EKG reviewed and interpreted by myself shows normal sinus rhythm at 69 bpm with a narrow QRS, normal axis, normal intervals, no concerning ST changes.  ____________________________________________    RADIOLOGY  Chest x-ray negative  ____________________________________________   INITIAL IMPRESSION / ASSESSMENT AND PLAN / ED COURSE  Pertinent labs & imaging results that were available during my care of the patient were reviewed by me and considered in my medical decision making (see chart for details).  Patient presents to the emergency department for chest discomfort beginning at 9:30 PM.  Patient's chest pain is concerning which include radiation to the left jaw diaphoresis and dizziness and shortness of breath.  Largely resolved at this time with very slight central chest discomfort currently.  Differential would include ACS, PE, pneumothorax, pneumonia, chest wall pain.  Patient's labs show a slight leukocytosis of 12,000 otherwise  normal.  Troponin negative.  Chest x-ray and EKG are normal/reassuring.  Patient's vital show 100% room air saturation with a pulse of 76.  No pleuritic chest pain however as the patient is 3 months status post right total knee replacement she is at a higher risk for DVT/PE.  Patient has an iodine contrast allergy.  A d-dimer will be performed.  If positive a VQ scan will be required.  Regardless given the patient's presentation with several red flags patient will require admission to the hospital for further work-up and treatment.  ____________________________________________   FINAL CLINICAL IMPRESSION(S) / ED DIAGNOSES  Chest pain    Harvest Dark, MD 09/29/17 1638

## 2017-09-29 NOTE — ED Notes (Signed)
Called to give report. RN unavailable at this time.

## 2017-09-29 NOTE — Progress Notes (Signed)
IV removed. Patient will leave by private vehicle with her husband.  Phillis Knack, RN

## 2017-09-29 NOTE — Care Management Obs Status (Signed)
[facility] NOTIFICATION   Patient Details  Name: Jean Gonzales MRN: [id number] Date of Birth: 27-Dec-1955   Medicare Observation Status Notification Given:  Yes    Latysha Thackston A Rajvir Ernster, RN 09/29/2017, 1:12 PM

## 2017-09-30 LAB — HIV ANTIBODY (ROUTINE TESTING W REFLEX): HIV SCREEN 4TH GENERATION: NONREACTIVE

## 2017-10-01 ENCOUNTER — Telehealth: Payer: Self-pay

## 2017-10-01 NOTE — Telephone Encounter (Signed)
I have made the 1st attempt to contact the patient or family member in charge, in order to follow up from recently being discharged from the hospital. I left a message on voicemail but I will make another attempt at a different time.   Direct call back - [phone] 

## 2017-10-01 NOTE — Telephone Encounter (Signed)
Patient called back and left a message stating she was feeling well and will be at her appt tomorrow 10/02/2017 with Kingsley Plan. Attempted to make 2nd attempt to call patient with no answer. Left message confirming hospital follow up appt.

## 2017-10-02 ENCOUNTER — Encounter: Payer: Self-pay | Admitting: Physician Assistant

## 2017-10-02 ENCOUNTER — Ambulatory Visit (INDEPENDENT_AMBULATORY_CARE_PROVIDER_SITE_OTHER): Payer: Medicare HMO | Admitting: Physician Assistant

## 2017-10-02 VITALS — BP 130/86 | HR 82 | Temp 97.9°F | Resp 16 | Wt 247.0 lb

## 2017-10-02 DIAGNOSIS — R600 Localized edema: Secondary | ICD-10-CM | POA: Diagnosis not present

## 2017-10-02 DIAGNOSIS — R339 Retention of urine, unspecified: Secondary | ICD-10-CM | POA: Diagnosis not present

## 2017-10-02 DIAGNOSIS — Z96651 Presence of right artificial knee joint: Secondary | ICD-10-CM | POA: Diagnosis not present

## 2017-10-02 DIAGNOSIS — R079 Chest pain, unspecified: Secondary | ICD-10-CM | POA: Diagnosis not present

## 2017-10-02 MED ORDER — FUROSEMIDE 20 MG PO TABS: 20.0000 mg | ORAL_TABLET | Freq: Two times a day (BID) | ORAL | 1 refills | Status: DC

## 2017-10-02 MED ORDER — POTASSIUM CHLORIDE ER 10 MEQ PO TBCR: 10.0000 meq | EXTENDED_RELEASE_TABLET | Freq: Two times a day (BID) | ORAL | 1 refills | Status: DC

## 2017-10-02 NOTE — Progress Notes (Signed)
Patient: Jean Gonzales Female    DOB: 1956-03-18   62 y.o.   MRN: [id number] Visit Date: 10/02/2017  Today's Provider: Mar Daring, PA-C   I, Martha Clan, CMA, am acting as scribe for [employer], PA-C.  Chief Complaint  Patient presents with  . Hospitalization Follow-up   Subjective:    HPI     Follow up Hospitalization  Patient was admitted to [facility] on 09/28/2017 and discharged on 09/29/2017. She was treated for bilateral lower extremity edema and chest pain. Treatment for this included none; unclear etiology per admission note, and work up was negative. She was given diuretics and potassium. Telephone follow up was done on 10/01/2017. She reports this condition is Improved. The BLE has returned since being D/C.  Also developed urinary retention during just prior to this on 09/19/17  (has known h/o this secondary to scarring from uterine cancer removal). Required catheterization from Urology x 1 week. Catheter removed on 09/26/17. She was instructed to try to CIC.   Patient reports that on 09/28/17 she had laid back in her chair and developed sudden left shoulder and arm pain. Pain subsequently radiated down her chest and across midline. She then developed SOB and a choking sensation. She was able to get up and stand up. Pain then radiated into her jaw bilateral and then her jaw became locked. 9-1-1 was then called. She was told to chew a 325mg  ASA. Symptoms improved. EKG changes were noted and it was decided to go to ER. On the way her chest pain returned. They stopped ambulance and got another EKG that started showing some abnormalities (borderline anterolateral ischemia). Nitroglycerin spray was given. Pain subsided. At hospital labs were checked. Troponins were negative. DDimer was positive. Bilat LE venous duplex were negative for DVT. Cardiac stress test showed small defect in apex but no other changes and EF 55%-65%. It was recommended by Dr. Chancy Milroy for  patient to have a VQ scan prior to discharge but this was never done.  ------------------------------------------------------------------------------------   Allergies  Allergen Reactions  . Iodinated Diagnostic Agents Anaphylaxis  . Iodine Swelling     Current Outpatient Medications:  .  acetaminophen (TYLENOL) 325 MG tablet, Take 1-2 tablets (325-650 mg total) by mouth every 6 (six) hours as needed for mild pain (pain score 1-3 or temp > 100.5)., Disp: 30 tablet, Rfl: 0 .  aspirin 325 MG tablet, Take 325 mg by mouth daily., Disp: , Rfl:  .  atorvastatin (LIPITOR) 40 MG tablet, TAKE 1 TABLET (40 MG) BY MOUTH EVERY DAY IN THE MORNING., Disp: 90 tablet, Rfl: 1 .  celecoxib (CELEBREX) 100 MG capsule, TAKE 1 CAPSULE BY MOUTH TWICE A DAY, Disp: 60 capsule, Rfl: 5 .  gabapentin (NEURONTIN) 300 MG capsule, Take 1 capsule (300 mg total) by mouth 3 (three) times daily., Disp: 60 capsule, Rfl: 0 .  hydrochlorothiazide (HYDRODIURIL) 25 MG tablet, Take 25 mg by mouth daily., Disp: , Rfl: 1 .  HYDROcodone-acetaminophen (NORCO/VICODIN) 5-325 MG tablet, Take 1-2 tablets by mouth every 4 (four) hours as needed for moderate pain (pain score 4-6)., Disp: 30 tablet, Rfl: 0 .  NEXIUM 40 MG capsule, Take 1 capsule (40 mg total) by mouth daily. Brand preferred by patient (Patient taking differently: Take 40 mg by mouth daily before breakfast. Brand preferred by patient), Disp: 30 capsule, Rfl: 3 .  zolpidem (AMBIEN) 10 MG tablet, Take 1 tablet (10 mg total) by mouth at bedtime as needed for  sleep., Disp: 30 tablet, Rfl: 5  Review of Systems  Constitutional: Negative for activity change, appetite change, chills, diaphoresis, fatigue, fever and unexpected weight change.  Respiratory: Negative for shortness of breath.   Cardiovascular: Positive for leg swelling. Negative for chest pain and palpitations (intermittent palpitations; none this week ).    Social History   Tobacco Use  . Smoking status: Never Smoker   . Smokeless tobacco: Never Used  Substance Use Topics  . Alcohol use: No    Alcohol/week: 0.0 oz   Objective:   BP 130/86 (BP Location: Left Arm, Patient Position: Sitting, Cuff Size: Large)   Pulse 82   Temp 97.9 F (36.6 C) (Oral)   Resp 16   Wt 247 lb (112 kg)   SpO2 95%   BMI 37.56 kg/m  Vitals:   10/02/17 1455  BP: 130/86  Pulse: 82  Resp: 16  Temp: 97.9 F (36.6 C)  TempSrc: Oral  SpO2: 95%  Weight: 247 lb (112 kg)     Physical Exam  Constitutional: She appears well-developed and well-nourished. No distress.  Neck: Normal range of motion. Neck supple. No JVD present. No tracheal deviation present. No thyromegaly present.  Cardiovascular: Normal rate, regular rhythm and normal heart sounds. Exam reveals no gallop and no friction rub.  No murmur heard. Pulmonary/Chest: Effort normal and breath sounds normal. No respiratory distress. She has no wheezes. She has no rales.  Musculoskeletal: She exhibits edema (2+ non pitting edema R LE, 1+ non pitting edema L LE).  Lymphadenopathy:    She has no cervical adenopathy.  Skin: She is not diaphoretic.  Vitals reviewed.      Assessment & Plan:     1. Bilateral lower extremity edema Discontinue HCTZ. Start furosemide 20mg  BID prn for swelling. Potassium given. Call if symptoms worsen. Return in 2-3 months for labs.  - furosemide (LASIX) 20 MG tablet; Take 1 tablet (20 mg total) by mouth 2 (two) times daily.  Dispense: 180 tablet; Refill: 1 - potassium chloride (K-DUR) 10 MEQ tablet; Take 1 tablet (10 mEq total) by mouth 2 (two) times daily.  Dispense: 180 tablet; Refill: 1  2. Chest pain, unspecified type Unspecified cause. Continue ASA 81mg  daily, may take 325mg  if desired. COntinue Atorvastatin 40mg  daily. Call if symptoms return and will refer to cardiology.   3. Status post total right knee replacement Followed by Ortho.  4. Urinary retention Followed by Dr. Bernardo Heater, urology. Scheduled for urethral dilatation  in 2 weeks.   I spent approximately 45 minutes with the patient today. Over 50% of this time was spent with counseling and educating the patient.

## 2017-10-05 DIAGNOSIS — Z96659 Presence of unspecified artificial knee joint: Secondary | ICD-10-CM | POA: Diagnosis not present

## 2017-10-10 ENCOUNTER — Encounter: Payer: Self-pay | Admitting: Emergency Medicine

## 2017-10-10 ENCOUNTER — Emergency Department
Admission: EM | Admit: 2017-10-10 | Discharge: 2017-10-10 | Disposition: A | Discharge status: Home / Self Care | Payer: Medicare HMO | Attending: Emergency Medicine | Admitting: Emergency Medicine

## 2017-10-10 ENCOUNTER — Emergency Department: Payer: Medicare HMO

## 2017-10-10 ENCOUNTER — Other Ambulatory Visit: Payer: Self-pay

## 2017-10-10 ENCOUNTER — Telehealth: Payer: Self-pay

## 2017-10-10 DIAGNOSIS — Z79899 Other long term (current) drug therapy: Secondary | ICD-10-CM | POA: Diagnosis not present

## 2017-10-10 DIAGNOSIS — Z96651 Presence of right artificial knee joint: Secondary | ICD-10-CM | POA: Diagnosis not present

## 2017-10-10 DIAGNOSIS — R079 Chest pain, unspecified: Secondary | ICD-10-CM | POA: Insufficient documentation

## 2017-10-10 DIAGNOSIS — E119 Type 2 diabetes mellitus without complications: Secondary | ICD-10-CM | POA: Insufficient documentation

## 2017-10-10 DIAGNOSIS — R0602 Shortness of breath: Secondary | ICD-10-CM | POA: Diagnosis not present

## 2017-10-10 DIAGNOSIS — I10 Essential (primary) hypertension: Secondary | ICD-10-CM | POA: Diagnosis not present

## 2017-10-10 DIAGNOSIS — Z859 Personal history of malignant neoplasm, unspecified: Secondary | ICD-10-CM | POA: Insufficient documentation

## 2017-10-10 DIAGNOSIS — Z7982 Long term (current) use of aspirin: Secondary | ICD-10-CM | POA: Diagnosis not present

## 2017-10-10 DIAGNOSIS — R0789 Other chest pain: Secondary | ICD-10-CM | POA: Diagnosis not present

## 2017-10-10 LAB — CBC
HEMATOCRIT: 38.5 % (ref 35.0–47.0)
HEMOGLOBIN: 12.8 g/dL (ref 12.0–16.0)
MCH: 29.3 pg (ref 26.0–34.0)
MCHC: 33.3 g/dL (ref 32.0–36.0)
MCV: 88.1 fL (ref 80.0–100.0)
Platelets: 269 10*3/uL (ref 150–440)
RBC: 4.37 MIL/uL (ref 3.80–5.20)
RDW: 13.9 % (ref 11.5–14.5)
WBC: 12.2 10*3/uL — AB (ref 3.6–11.0)

## 2017-10-10 LAB — BASIC METABOLIC PANEL
Anion gap: 10 (ref 5–15)
BUN: 13 mg/dL (ref 8–23)
CHLORIDE: 106 mmol/L (ref 98–111)
CO2: 24 mmol/L (ref 22–32)
Calcium: 9.8 mg/dL (ref 8.9–10.3)
Creatinine, Ser: 0.91 mg/dL (ref 0.44–1.00)
GFR calc non Af Amer: >60 mL/min (ref >60)
Glucose, Bld: 128 mg/dL — ABNORMAL HIGH (ref 70–99)
POTASSIUM: 3.6 mmol/L (ref 3.5–5.1)
SODIUM: 140 mmol/L (ref 135–145)

## 2017-10-10 LAB — TROPONIN I
Troponin I: <0.03 ng/mL (ref <0.03)
Troponin I: <0.03 ng/mL (ref <0.03)

## 2017-10-10 MED ORDER — KETOROLAC TROMETHAMINE 30 MG/ML IJ SOLN
30.0000 mg | Freq: Once | INTRAMUSCULAR | Status: AC
  Administered 2017-10-10: 30 mg via INTRAVENOUS
  Filled 2017-10-10: qty 1

## 2017-10-10 MED ORDER — GI COCKTAIL ~~LOC~~
30.0000 mL | Freq: Once | ORAL | Status: AC
  Administered 2017-10-10: 30 mL via ORAL
  Filled 2017-10-10: qty 30

## 2017-10-10 NOTE — Discharge Instructions (Addendum)
Please seek medical attention for any high fevers, chest pain, shortness of breath, change in behavior, persistent vomiting, bloody stool or any other new or concerning symptoms.  

## 2017-10-10 NOTE — ED Triage Notes (Signed)
Pt presents to ED via POV with substernal CP since Monday. Pt states was seen over the weekend and dx with blood clot. Pt states initially thought it was reflux but no relief with reflux medication. Pt denies radiation, describes pain as "hard, hard, hard, pressure."

## 2017-10-10 NOTE — Telephone Encounter (Signed)
Pt called complaining of "chest pressure" and shortness of breath.  She states it started Sunday 10/07/2017 and is getting worse.  She says it is hard to "take a deep breath"  She feels like she "needs to be put on oxygen".  She thought the chest pressure was secondary to reflux; but pt reports it doesn't feel like her "usual reflux symptoms"  She is taking Nexium 40mg  twice a day this week with no relief in symptoms.    I advised her it would be best if she went to the ER to be evaluated, she was recently admitted in the beginning of July for similar symptoms.  She agreed with me and states she has a ride to the ER.    Thanks,   -Mickel Baas

## 2017-10-10 NOTE — ED Notes (Addendum)
Patient stated to this EDT that she was feeling short of breath, that she wanted oxygen and no actions have been taken. O2 stats at 100% Archie Balboa, MD notified.

## 2017-10-10 NOTE — ED Notes (Signed)
Pt c/o sensation of shortness of breath increasing and her throat swelling shut after taking GI cocktail. Pt refused O2/[place] when it was offered, despite SaO2 of 100%. Pt is hyperventilating presently. Pt has multiple questions regarding her lab and imaging tests and Dr. Archie Balboa was made aware of her discontent w/ treatment thus far.

## 2017-10-10 NOTE — Telephone Encounter (Signed)
Agreed -

## 2017-10-10 NOTE — ED Provider Notes (Signed)
[facility] Emergency Department Provider Note   ____________________________________________   I have reviewed the triage vital signs and the nursing notes.   HISTORY  Chief Complaint Chest Pain   History limited by: Not Limited   HPI Jean Gonzales is a 62 y.o. female who presents to the emergency department today because of concern for chest pressure. Located in the center part of her chest. Has been present for the past few days. The patient states that the pain is worse when she presses on her chest. She has had some shortness of breath. The patient was recently admitted to the hospital for chest pain. Had negative troponin and stress test.    Per medical record review patient has a history of recent admission for chest pain. Negative troponin. Positive d-dimer but negative lower extremity edema.   Past Medical History:  Diagnosis Date  . Arthritis    osteoarthritis right knee  . Cancer [facility]) 2014   endometrial CA  . Diabetes mellitus without complication ([place])   . Hyperlipidemia   . Hypertension     Patient Active Problem List   Diagnosis Date Noted  . Chest pain 09/29/2017  . History of total knee arthroplasty 08/01/2017  . Osteoarthritis of right knee 07/09/2017  . Osteoarthritis 05/07/2017  . Synovitis of knee 12/29/2016  . Knee joint effusion 12/29/2016  . Malignant neoplasm of endometrium ([place]) 02/22/2015  . Acid reflux 02/22/2015  . History of migraine headaches 02/22/2015  . HLD (hyperlipidemia) 02/22/2015  . BP (high blood pressure) 02/22/2015  . History of colon polyps 02/22/2015  . Urinary incontinence 02/22/2015  . Psoriatic arthritis ([place]) 02/22/2015  . Diabetes mellitus ([place]) 10/06/2014  . Urethral stenosis 12/03/2013  . Rheumatoid arthritis ([place]) 03/14/2013    Past Surgical History:  Procedure Laterality Date  . ABDOMINAL HYSTERECTOMY  2015   cancer  . BREAST BIOPSY Right 2008ish   core  . CHOLECYSTECTOMY   1992  . TONSILLECTOMY  1973  . TONSILLECTOMY    . TOTAL KNEE ARTHROPLASTY Right 07/09/2017   Procedure: TOTAL KNEE ARTHROPLASTY;  Surgeon: Lovell Sheehan, MD;  Location: [facility] ORS;  Service: Orthopedics;  Laterality: Right;    Prior to Admission medications   Medication Sig Start Date End Date Taking? Authorizing Provider  acetaminophen (TYLENOL) 325 MG tablet Take 1-2 tablets (325-650 mg total) by mouth every 6 (six) hours as needed for mild pain (pain score 1-3 or temp > 100.5). 07/12/17   Lovell Sheehan, MD  aspirin 325 MG tablet Take 325 mg by mouth daily.    [provider]  atorvastatin (LIPITOR) 40 MG tablet TAKE 1 TABLET (40 MG) BY MOUTH EVERY DAY IN THE MORNING. 09/29/17   Fritzi Mandes, MD  celecoxib (CELEBREX) 100 MG capsule TAKE 1 CAPSULE BY MOUTH TWICE A DAY 09/07/17   Mar Daring, PA-C  furosemide (LASIX) 20 MG tablet Take 1 tablet (20 mg total) by mouth 2 (two) times daily. 10/02/17   Mar Daring, PA-C  gabapentin (NEURONTIN) 300 MG capsule Take 1 capsule (300 mg total) by mouth 3 (three) times daily. 07/12/17   Lovell Sheehan, MD  HYDROcodone-acetaminophen (NORCO/VICODIN) 5-325 MG tablet Take 1-2 tablets by mouth every 4 (four) hours as needed for moderate pain (pain score 4-6). 07/12/17   Lovell Sheehan, MD  NEXIUM 40 MG capsule Take 1 capsule (40 mg total) by mouth daily. Brand preferred by patient Patient taking differently: Take 40 mg by mouth daily before breakfast. Brand  preferred by patient 04/30/17   Mar Daring, PA-C  potassium chloride (K-DUR) 10 MEQ tablet Take 1 tablet (10 mEq total) by mouth 2 (two) times daily. 10/02/17   Mar Daring, PA-C  zolpidem (AMBIEN) 10 MG tablet Take 1 tablet (10 mg total) by mouth at bedtime as needed for sleep. 09/05/17   Mar Daring, PA-C    Allergies Iodinated diagnostic agents and Iodine  Family History  Problem Relation Age of Onset  . Diabetes Mother   . Cancer Mother   . Heart disease  Father   . Breast cancer Neg Hx   . Kidney cancer Neg Hx   . Bladder Cancer Neg Hx     Social History Social History   Tobacco Use  . Smoking status: Never Smoker  . Smokeless tobacco: Never Used  Substance Use Topics  . Alcohol use: No    Alcohol/week: 0.0 oz  . Drug use: No    Review of Systems Constitutional: No fever/chills Eyes: No visual changes. ENT: No sore throat. Cardiovascular: Positive for chest pain. Respiratory: Positive for shortness of breath. Gastrointestinal: No abdominal pain.  No nausea, no vomiting.  No diarrhea.   Genitourinary: Negative for dysuria. Musculoskeletal: Positive for lower extremity swelling Skin: Negative for rash. Neurological: Negative for headaches, focal weakness or numbness.  ____________________________________________   PHYSICAL EXAM:  VITAL SIGNS: ED Triage Vitals  Enc Vitals Group     BP 10/10/17 1728 (!) 141/86     Pulse Rate 10/10/17 1728 81     Resp --      Temp 10/10/17 1728 98.6 F (37 C)     Temp Source 10/10/17 1728 Oral     SpO2 10/10/17 1728 98 %     Weight 10/10/17 1731 247 lb (112 kg)     Height 10/10/17 1731 5\' 8"  (1.727 m)     Head Circumference --      Peak Flow --      Pain Score 10/10/17 1730 8   Constitutional: Alert and oriented.  Eyes: Conjunctivae are normal.  ENT      Head: Normocephalic and atraumatic.      Nose: No congestion/rhinnorhea.      Mouth/Throat: Mucous membranes are moist.      Neck: No stridor. Hematological/Lymphatic/Immunilogical: No cervical lymphadenopathy. Cardiovascular: Normal rate, regular rhythm.  No murmurs, rubs, or gallops. Respiratory: Normal respiratory effort without tachypnea nor retractions. Breath sounds are clear and equal bilaterally. No wheezes/rales/rhonchi. Gastrointestinal: Soft and non tender. No rebound. No guarding.  Genitourinary: Deferred Musculoskeletal: Normal range of motion in all extremities. Tender to palpation over the sternum.   Neurologic:  Normal speech and language. No gross focal neurologic deficits are appreciated.  Skin:  Skin is warm, dry and intact. No rash noted. Psychiatric: Mood and affect are normal. Speech and behavior are normal. Patient exhibits appropriate insight and judgment.  ____________________________________________    LABS (pertinent positives/negatives)  CBC wbc 12.2, hgb 12.8, plt 269 Trop <0.03 BMP wnl except glu 128  ____________________________________________   EKG  I, Nance Pear, attending physician, personally viewed and interpreted this EKG  EKG Time: 1734 Rate: 82 Rhythm: normal sinus rhythm Axis: right superior axis deviation Intervals: qtc 434 QRS: narrow ST changes: no st elevation Impression: abnormal ekg   ____________________________________________    RADIOLOGY  CXR No acute findings  ____________________________________________   PROCEDURES  Procedures  ____________________________________________   INITIAL IMPRESSION / ASSESSMENT AND PLAN / ED COURSE  Pertinent labs & imaging results that  were available during my care of the patient were reviewed by me and considered in my medical decision making (see chart for details).   Patient presented to the emergency department today because of concerns for chest pressure.  Patient had a recent admission for chest pain.  During that admission patient had negative troponins.  Did have an elevated d-dimer however had negative lower extremity ultrasounds.  The patient was under the impression she had a blood clot.  I discussed with the patient that the elevated d-dimer does not necessarily mean she had a blood clot.  Tried to explain to her that it can be elevated for other reasons.  However did discuss with patient possibility getting VQ scan.  Unfortunately this would not occur until the morning for [place].  Did offer to have patient stay overnight.  She did request discharge.  I discussed with patient  that she could have her primary care physician order this if she continues to have issues.  I also discussed importance of follow-up and that she should follow-up with cardiology.  Patient however stated that she would not follow-up with cardiology since nothing so far as indicated her heart is an issue.    ____________________________________________   FINAL CLINICAL IMPRESSION(S) / ED DIAGNOSES  Final diagnoses:  Nonspecific chest pain     Note: This dictation was prepared with Dragon dictation. Any transcriptional errors that result from this process are unintentional     Nance Pear, MD 10/10/17 2232

## 2017-10-11 ENCOUNTER — Telehealth: Payer: Self-pay | Admitting: Physician Assistant

## 2017-10-11 DIAGNOSIS — H2513 Age-related nuclear cataract, bilateral: Secondary | ICD-10-CM | POA: Diagnosis not present

## 2017-10-11 NOTE — Telephone Encounter (Signed)
Patient reports that she still having chest pressure and SOB. Reports that she didn't appreciate the doctor that saw her and reports he was negative about everything.

## 2017-10-11 NOTE — Telephone Encounter (Signed)
Pt request CMA to return call. Pt stated she spoke with Mickel Baas yesterday 10/10/17 and was advised to go to ED and call back to give an update. Pt request to speak with CMA or Tawanna Sat to give the update. Please advise. Thanks TNP

## 2017-10-18 ENCOUNTER — Ambulatory Visit: Payer: Self-pay | Admitting: Physician Assistant

## 2017-10-26 DIAGNOSIS — R030 Elevated blood-pressure reading, without diagnosis of hypertension: Secondary | ICD-10-CM | POA: Diagnosis not present

## 2017-10-26 DIAGNOSIS — E1142 Type 2 diabetes mellitus with diabetic polyneuropathy: Secondary | ICD-10-CM | POA: Diagnosis not present

## 2017-10-26 DIAGNOSIS — Z791 Long term (current) use of non-steroidal anti-inflammatories (NSAID): Secondary | ICD-10-CM | POA: Diagnosis not present

## 2017-10-26 DIAGNOSIS — K219 Gastro-esophageal reflux disease without esophagitis: Secondary | ICD-10-CM | POA: Diagnosis not present

## 2017-10-26 DIAGNOSIS — G4700 Insomnia, unspecified: Secondary | ICD-10-CM | POA: Diagnosis not present

## 2017-10-26 DIAGNOSIS — R32 Unspecified urinary incontinence: Secondary | ICD-10-CM | POA: Diagnosis not present

## 2017-10-26 DIAGNOSIS — N3592 Unspecified urethral stricture, female: Secondary | ICD-10-CM | POA: Diagnosis not present

## 2017-10-26 DIAGNOSIS — M0550 Rheumatoid polyneuropathy with rheumatoid arthritis of unspecified site: Secondary | ICD-10-CM | POA: Diagnosis not present

## 2017-10-26 DIAGNOSIS — E785 Hyperlipidemia, unspecified: Secondary | ICD-10-CM | POA: Diagnosis not present

## 2017-10-26 DIAGNOSIS — G8929 Other chronic pain: Secondary | ICD-10-CM | POA: Diagnosis not present

## 2017-11-03 ENCOUNTER — Other Ambulatory Visit: Payer: Self-pay | Admitting: Physician Assistant

## 2017-11-03 DIAGNOSIS — I10 Essential (primary) hypertension: Secondary | ICD-10-CM

## 2017-11-09 ENCOUNTER — Telehealth: Payer: Self-pay | Admitting: Urology

## 2017-11-09 ENCOUNTER — Telehealth: Payer: Self-pay

## 2017-11-09 DIAGNOSIS — N9989 Other postprocedural complications and disorders of genitourinary system: Secondary | ICD-10-CM

## 2017-11-09 MED ORDER — TAMSULOSIN HCL 0.4 MG PO CAPS: 0.4000 mg | ORAL_CAPSULE | Freq: Every day | ORAL | 3 refills | Status: DC

## 2017-11-09 NOTE — Progress Notes (Deleted)
11/12/2017 7:53 AM   Jean Gonzales 01-06-1956 [id number]  Referring provider: Mar Daring, PA-C [place], [place] [address]  No chief complaint on file.   HPI: Patient is a 62 year old Caucasian female with a history of urinary retention and history of hydronephrosis who presents today for follow-up.  Background history Patient is a 62 year old Caucasian female with a history of incomplete bladder emptying who presents today for a TOV.  She presented to the office on 09/19/2017 with the complaints of only voiding once that day.  Bladder scan was 385 mL.  A Foley catheter was placed for bladder decompression.  She had a total knee replacement in April 2019 and developed right flank pain and a CT showed right hydronephrosis.  Her bladder was noted to be distended and a Foley catheter was placed.  Her hydronephrosis resolved with bladder drainage and this had previously occurred in 2018.  She presently has no voiding complaints.  She is back on tamsulosin.   RUS in 08/2017 noted no hydronephrosis.  Foley is removed without difficulty.   She is going to try and cath herself again at home.  Patient denies any gross hematuria, dysuria or suprapubic/flank pain.  Patient denies any fevers, chills, nausea or vomiting.   She states that she feels she may be getting another urinary tract infection as the urine is getting dark.  She states that she does not have typical symptoms of UTIs such as dysuria due to the radiation to her vulvar area.  She had an UTI with pan sensitive E.coli in 08/2017.    Today, ***.  Patient denies any gross hematuria, dysuria or suprapubic/flank pain.  Patient denies any fevers, chills, nausea or vomiting.  ***   Her PVR is ***.     PMH: Past Medical History:  Diagnosis Date  . Arthritis    osteoarthritis right knee  . Cancer [facility]) 2014   endometrial CA  . Diabetes mellitus without complication ([place])   . Hyperlipidemia   .  Hypertension     Surgical History: Past Surgical History:  Procedure Laterality Date  . ABDOMINAL HYSTERECTOMY  2015   cancer  . BREAST BIOPSY Right 2008ish   core  . CHOLECYSTECTOMY  1992  . TONSILLECTOMY  1973  . TONSILLECTOMY    . TOTAL KNEE ARTHROPLASTY Right 07/09/2017   Procedure: TOTAL KNEE ARTHROPLASTY;  Surgeon: Lovell Sheehan, MD;  Location: [facility] ORS;  Service: Orthopedics;  Laterality: Right;    Home Medications:  Allergies as of 11/12/2017      Reactions   Iodinated Diagnostic Agents Anaphylaxis   Iodine Swelling      Medication List        Accurate as of 11/09/17  7:53 AM. Always use your most recent med list.          acetaminophen 325 MG tablet Commonly known as:  TYLENOL Take 1-2 tablets (325-650 mg total) by mouth every 6 (six) hours as needed for mild pain (pain score 1-3 or temp > 100.5).   aspirin 325 MG tablet Take 325 mg by mouth daily.   aspirin EC 81 MG tablet Take 81 mg by mouth daily.   atorvastatin 40 MG tablet Commonly known as:  LIPITOR TAKE 1 TABLET (40 MG) BY MOUTH EVERY DAY IN THE MORNING.   celecoxib 100 MG capsule Commonly known as:  CELEBREX TAKE 1 CAPSULE BY MOUTH TWICE A DAY   furosemide 20 MG tablet Commonly known  as:  LASIX Take 1 tablet (20 mg total) by mouth 2 (two) times daily.   gabapentin 300 MG capsule Commonly known as:  NEURONTIN Take 1 capsule (300 mg total) by mouth 3 (three) times daily.   hydrochlorothiazide 25 MG tablet Commonly known as:  HYDRODIURIL TAKE 1 TABLET BY MOUTH EVERY DAY   HYDROcodone-acetaminophen 5-325 MG tablet Commonly known as:  NORCO/VICODIN Take 1-2 tablets by mouth every 4 (four) hours as needed for moderate pain (pain score 4-6).   NEXIUM 40 MG capsule Generic drug:  esomeprazole Take 1 capsule (40 mg total) by mouth daily. Brand preferred by patient   potassium chloride 10 MEQ tablet Commonly known as:  K-DUR Take 1 tablet (10 mEq total) by mouth 2 (two) times daily.     zolpidem 10 MG tablet Commonly known as:  AMBIEN Take 1 tablet (10 mg total) by mouth at bedtime as needed for sleep.       Allergies:  Allergies  Allergen Reactions  . Iodinated Diagnostic Agents Anaphylaxis  . Iodine Swelling    Family History: Family History  Problem Relation Age of Onset  . Diabetes Mother   . Cancer Mother   . Heart disease Father   . Breast cancer Neg Hx   . Kidney cancer Neg Hx   . Bladder Cancer Neg Hx     Social History:  reports that she has never smoked. She has never used smokeless tobacco. She reports that she does not drink alcohol or use drugs.  ROS:                                        Physical Exam: There were no vitals taken for this visit.  Constitutional: Well nourished. Alert and oriented, No acute distress. HEENT: [place] AT, moist mucus membranes. Trachea midline, no masses. Cardiovascular: No clubbing, cyanosis, or edema. Respiratory: Normal respiratory effort, no increased work of breathing. Skin: No rashes, bruises or suspicious lesions. Lymph: No cervical or inguinal adenopathy. Neurologic: Grossly intact, no focal deficits, moving all 4 extremities. Psychiatric: Normal mood and affect.   Laboratory Data: Urinalysis ***   Assessment & Plan:    1. Incomplete bladder emptying Foley removed today Continue tamsulosin 0.4 mg daily Patient will try to CIC at home RTC in one month for PVR, dilation and UA  2. History of hydronephrosis Resolved with bladder decompression  3. ?UTI Patient states she has atypical symptoms of UTIs I have sent a prescription for Septra DS to her pharmacy and advised her not to take the medication unless her urine continues to darken or becomes malodorous or if she should have fevers or suprapubic pain  [name], PA-C  [place] [address] [place], [place] [address] [phone]

## 2017-11-09 NOTE — Telephone Encounter (Signed)
Patient is requesting a refill on tamsulosin 0.4 mg capsule TAKE ONE CAPSULE BY MOUTH EVERY DAY be sent to [facility] in [place]. She states her GYN was prescribing the medication previously. CB#[phone]

## 2017-11-09 NOTE — Telephone Encounter (Signed)
Pt needs refill sent to [facility] in [place]  tamsulosin (FLOMAX) 0.4 MG CAPS capsule [[id number]]

## 2017-11-09 NOTE — Telephone Encounter (Signed)
Sent in

## 2017-11-12 ENCOUNTER — Ambulatory Visit: Payer: Medicare HMO | Admitting: Urology

## 2017-11-14 NOTE — Telephone Encounter (Signed)
Script sent  

## 2017-11-28 ENCOUNTER — Ambulatory Visit (INDEPENDENT_AMBULATORY_CARE_PROVIDER_SITE_OTHER): Payer: Medicare HMO | Admitting: Urology

## 2017-11-28 ENCOUNTER — Encounter: Payer: Self-pay | Admitting: Urology

## 2017-11-28 VITALS — BP 134/82 | HR 87 | Ht 67.0 in | Wt 244.0 lb

## 2017-11-28 DIAGNOSIS — R339 Retention of urine, unspecified: Secondary | ICD-10-CM

## 2017-11-28 DIAGNOSIS — Z87448 Personal history of other diseases of urinary system: Secondary | ICD-10-CM

## 2017-11-28 NOTE — Progress Notes (Signed)
11/28/2017 4:04 PM   Jean Gonzales May 05, 1955 [id number]  Referring provider: Mar Daring, PA-C [address] [place], [place] [address]  Chief Complaint  Patient presents with  . Urinary Retention    HPI: Patient is a 62 year old Caucasian female with a history of urinary retention and history of hydronephrosis who presents today for follow-up.  Background history Patient is a 62 year old Caucasian female with a history of incomplete bladder emptying who presents today for a TOV.  She presented to the office on 09/19/2017 with the complaints of only voiding once that day.  Bladder scan was 385 mL.  A Foley catheter was placed for bladder decompression.  She had a total knee replacement in April 2019 and developed right flank pain and a CT showed right hydronephrosis.  Her bladder was noted to be distended and a Foley catheter was placed.  Her hydronephrosis resolved with bladder drainage and this had previously occurred in 2018.  She presently has no voiding complaints.  She is back on tamsulosin.   RUS in 08/2017 noted no hydronephrosis.  Foley is removed without difficulty.   She is going to try and cath herself again at home.  Patient denies any gross hematuria, dysuria or suprapubic/flank pain.  Patient denies any fevers, chills, nausea or vomiting.   She states that she feels she may be getting another urinary tract infection as the urine is getting dark.  She states that she does not have typical symptoms of UTIs such as dysuria due to the radiation to her vulvar area.  She had an UTI with pan sensitive E.coli in 08/2017.    Today, she is complaining of urgency, nocturia, incontinence and a weak urinary stream.  Patient denies any gross hematuria, dysuria or suprapubic/flank pain.  Patient denies any fevers, chills, nausea or vomiting.    PMH: Past Medical History:  Diagnosis Date  . Arthritis    osteoarthritis right knee  . Cancer [facility]) 2014   endometrial CA  . Diabetes mellitus without complication ([place])   . Hyperlipidemia   . Hypertension     Surgical History: Past Surgical History:  Procedure Laterality Date  . ABDOMINAL HYSTERECTOMY  2015   cancer  . BREAST BIOPSY Right 2008ish   core  . CHOLECYSTECTOMY  1992  . TONSILLECTOMY  1973  . TONSILLECTOMY    . TOTAL KNEE ARTHROPLASTY Right 07/09/2017   Procedure: TOTAL KNEE ARTHROPLASTY;  Surgeon: Lovell Sheehan, MD;  Location: [facility] ORS;  Service: Orthopedics;  Laterality: Right;    Home Medications:  Allergies as of 11/28/2017      Reactions   Iodinated Diagnostic Agents Anaphylaxis   Iodine Swelling      Medication List        Accurate as of 11/28/17  4:04 PM. Always use your most recent med list.          acetaminophen 325 MG tablet Commonly known as:  TYLENOL Take 1-2 tablets (325-650 mg total) by mouth every 6 (six) hours as needed for mild pain (pain score 1-3 or temp > 100.5).   aspirin 325 MG tablet Take 325 mg by mouth daily.   aspirin EC 81 MG tablet Take 81 mg by mouth daily.   atorvastatin 40 MG tablet Commonly known as:  LIPITOR TAKE 1 TABLET (40 MG) BY MOUTH EVERY DAY IN THE MORNING.   celecoxib 100 MG capsule Commonly known as:  CELEBREX TAKE 1 CAPSULE BY MOUTH TWICE A DAY  furosemide 20 MG tablet Commonly known as:  LASIX Take 1 tablet (20 mg total) by mouth 2 (two) times daily.   gabapentin 300 MG capsule Commonly known as:  NEURONTIN Take 1 capsule (300 mg total) by mouth 3 (three) times daily.   hydrochlorothiazide 25 MG tablet Commonly known as:  HYDRODIURIL TAKE 1 TABLET BY MOUTH EVERY DAY   NEXIUM 40 MG capsule Generic drug:  esomeprazole Take 1 capsule (40 mg total) by mouth daily. Brand preferred by patient   tamsulosin 0.4 MG Caps capsule Commonly known as:  FLOMAX Take 1 capsule (0.4 mg total) by mouth daily.   zolpidem 10 MG tablet Commonly known as:  AMBIEN Take 1 tablet (10 mg total) by mouth at bedtime as  needed for sleep.       Allergies:  Allergies  Allergen Reactions  . Iodinated Diagnostic Agents Anaphylaxis  . Iodine Swelling    Family History: Family History  Problem Relation Age of Onset  . Diabetes Mother   . Cancer Mother   . Heart disease Father   . Breast cancer Neg Hx   . Kidney cancer Neg Hx   . Bladder Cancer Neg Hx     Social History:  reports that she has never smoked. She has never used smokeless tobacco. She reports that she does not drink alcohol or use drugs.  ROS: UROLOGY Frequent Urination?: No(Simultaneous filing. User may not have seen previous data.) Hard to postpone urination?: Yes Burning/pain with urination?: No Get up at night to urinate?: Yes Leakage of urine?: Yes Urine stream starts and stops?: No Trouble starting stream?: No Do you have to strain to urinate?: No Blood in urine?: No Urinary tract infection?: No Sexually transmitted disease?: No Injury to kidneys or bladder?: No Painful intercourse?: No Weak stream?: Yes Currently pregnant?: No Vaginal bleeding?: No Last menstrual period?: n  Gastrointestinal Nausea?: No Vomiting?: No Indigestion/heartburn?: No Diarrhea?: No Constipation?: No  Constitutional Fever: No Night sweats?: No Weight loss?: No Fatigue?: No  Skin Skin rash/lesions?: No Itching?: No  Eyes Blurred vision?: No Double vision?: No  Ears/Nose/Throat Sore throat?: No Sinus problems?: No  Hematologic/Lymphatic Swollen glands?: No Easy bruising?: No  Cardiovascular Leg swelling?: No Chest pain?: No  Respiratory Cough?: No Shortness of breath?: No  Endocrine Excessive thirst?: No  Musculoskeletal Back pain?: Yes Joint pain?: Yes  Neurological Headaches?: No Dizziness?: No  Psychologic Depression?: Yes Anxiety?: Yes  Physical Exam: BP 134/82 (BP Location: Left Arm, Patient Position: Sitting)   Pulse 87   Ht 5\' 7"  (1.702 m)   Wt 244 lb (110.7 kg)   BMI 38.22 kg/m     Constitutional: Well nourished. Alert and oriented, No acute distress. HEENT: [place] AT, moist mucus membranes. Trachea midline, no masses. Cardiovascular: No clubbing, cyanosis, or edema. Respiratory: Normal respiratory effort, no increased work of breathing. Skin: No rashes, bruises or suspicious lesions. Lymph: No cervical or inguinal adenopathy. Neurologic: Grossly intact, no focal deficits, moving all 4 extremities. Psychiatric: Normal mood and affect.   Laboratory Data: N/A  Assessment & Plan:    1. Incomplete bladder emptying Patient is doing well with CIC Will get a prescription for straight catheters RTC in one year for recheck   2. History of hydronephrosis Resolved with bladder decompression   [name], PA-C  [place] [address], [place], [place] [address] [phone]

## 2017-12-06 DIAGNOSIS — H2513 Age-related nuclear cataract, bilateral: Secondary | ICD-10-CM | POA: Diagnosis not present

## 2017-12-06 DIAGNOSIS — H35372 Puckering of macula, left eye: Secondary | ICD-10-CM | POA: Diagnosis not present

## 2017-12-12 ENCOUNTER — Other Ambulatory Visit: Payer: Self-pay | Admitting: Physician Assistant

## 2017-12-12 DIAGNOSIS — M1711 Unilateral primary osteoarthritis, right knee: Secondary | ICD-10-CM

## 2017-12-18 ENCOUNTER — Ambulatory Visit (INDEPENDENT_AMBULATORY_CARE_PROVIDER_SITE_OTHER): Payer: Medicare HMO | Admitting: Physician Assistant

## 2017-12-18 ENCOUNTER — Encounter: Payer: Self-pay | Admitting: Physician Assistant

## 2017-12-18 VITALS — BP 126/82 | HR 86 | Temp 98.2°F | Wt 250.0 lb

## 2017-12-18 DIAGNOSIS — E7800 Pure hypercholesterolemia, unspecified: Secondary | ICD-10-CM | POA: Diagnosis not present

## 2017-12-18 DIAGNOSIS — Z0000 Encounter for general adult medical examination without abnormal findings: Secondary | ICD-10-CM | POA: Diagnosis not present

## 2017-12-18 DIAGNOSIS — Z1239 Encounter for other screening for malignant neoplasm of breast: Secondary | ICD-10-CM

## 2017-12-18 DIAGNOSIS — E1142 Type 2 diabetes mellitus with diabetic polyneuropathy: Secondary | ICD-10-CM | POA: Diagnosis not present

## 2017-12-18 DIAGNOSIS — L4050 Arthropathic psoriasis, unspecified: Secondary | ICD-10-CM | POA: Diagnosis not present

## 2017-12-18 DIAGNOSIS — Z23 Encounter for immunization: Secondary | ICD-10-CM

## 2017-12-18 DIAGNOSIS — M069 Rheumatoid arthritis, unspecified: Secondary | ICD-10-CM

## 2017-12-18 DIAGNOSIS — I10 Essential (primary) hypertension: Secondary | ICD-10-CM | POA: Diagnosis not present

## 2017-12-18 DIAGNOSIS — C541 Malignant neoplasm of endometrium: Secondary | ICD-10-CM | POA: Diagnosis not present

## 2017-12-18 DIAGNOSIS — M1711 Unilateral primary osteoarthritis, right knee: Secondary | ICD-10-CM

## 2017-12-18 DIAGNOSIS — Z6839 Body mass index (BMI) 39.0-39.9, adult: Secondary | ICD-10-CM | POA: Diagnosis not present

## 2017-12-18 DIAGNOSIS — Z1231 Encounter for screening mammogram for malignant neoplasm of breast: Secondary | ICD-10-CM | POA: Diagnosis not present

## 2017-12-18 DIAGNOSIS — Z96651 Presence of right artificial knee joint: Secondary | ICD-10-CM

## 2017-12-18 NOTE — Patient Instructions (Signed)
Health Maintenance for Postmenopausal Women Menopause is a normal process in which your reproductive ability comes to an end. This process happens gradually over a span of months to years, usually between the ages of 22 and 9. Menopause is complete when you have missed 12 consecutive menstrual periods. It is important to talk with your health care provider about some of the most common conditions that affect postmenopausal women, such as heart disease, cancer, and bone loss (osteoporosis). Adopting a healthy lifestyle and getting preventive care can help to promote your health and wellness. Those actions can also lower your chances of developing some of these common conditions. What should I know about menopause? During menopause, you may experience a number of symptoms, such as:  Moderate-to-severe hot flashes.  Night sweats.  Decrease in sex drive.  Mood swings.  Headaches.  Tiredness.  Irritability.  Memory problems.  Insomnia.  Choosing to treat or not to treat menopausal changes is an individual decision that you make with your health care provider. What should I know about hormone replacement therapy and supplements? Hormone therapy products are effective for treating symptoms that are associated with menopause, such as hot flashes and night sweats. Hormone replacement carries certain risks, especially as you become older. If you are thinking about using estrogen or estrogen with progestin treatments, discuss the benefits and risks with your health care provider. What should I know about heart disease and stroke? Heart disease, heart attack, and stroke become more likely as you age. This may be due, in part, to the hormonal changes that your body experiences during menopause. These can affect how your body processes dietary fats, triglycerides, and cholesterol. Heart attack and stroke are both medical emergencies. There are many things that you can do to help prevent heart disease  and stroke:  Have your blood pressure checked at least every 1-2 years. High blood pressure causes heart disease and increases the risk of stroke.  If you are 53-22 years old, ask your health care provider if you should take aspirin to prevent a heart attack or a stroke.  Do not use any tobacco products, including cigarettes, chewing tobacco, or electronic cigarettes. If you need help quitting, ask your health care provider.  It is important to eat a healthy diet and maintain a healthy weight. ? Be sure to include plenty of vegetables, fruits, low-fat dairy products, and lean protein. ? Avoid eating foods that are high in solid fats, added sugars, or salt (sodium).  Get regular exercise. This is one of the most important things that you can do for your health. ? Try to exercise for at least 150 minutes each week. The type of exercise that you do should increase your heart rate and make you sweat. This is known as moderate-intensity exercise. ? Try to do strengthening exercises at least twice each week. Do these in addition to the moderate-intensity exercise.  Know your numbers.Ask your health care provider to check your cholesterol and your blood glucose. Continue to have your blood tested as directed by your health care provider.  What should I know about cancer screening? There are several types of cancer. Take the following steps to reduce your risk and to catch any cancer development as early as possible. Breast Cancer  Practice breast self-awareness. ? This means understanding how your breasts normally appear and feel. ? It also means doing regular breast self-exams. Let your health care provider know about any changes, no matter how small.  If you are 40  or older, have a clinician do a breast exam (clinical breast exam or CBE) every year. Depending on your age, family history, and medical history, it may be recommended that you also have a yearly breast X-ray (mammogram).  If you  have a family history of breast cancer, talk with your health care provider about genetic screening.  If you are at high risk for breast cancer, talk with your health care provider about having an MRI and a mammogram every year.  Breast cancer (BRCA) gene test is recommended for women who have family members with BRCA-related cancers. Results of the assessment will determine the need for genetic counseling and BRCA1 and for BRCA2 testing. BRCA-related cancers include these types: ? Breast. This occurs in males or females. ? Ovarian. ? Tubal. This may also be called fallopian tube cancer. ? Cancer of the abdominal or pelvic lining (peritoneal cancer). ? Prostate. ? Pancreatic.  Cervical, Uterine, and Ovarian Cancer Your health care provider may recommend that you be screened regularly for cancer of the pelvic organs. These include your ovaries, uterus, and vagina. This screening involves a pelvic exam, which includes checking for microscopic changes to the surface of your cervix (Pap test).  For women ages 21-65, health care providers may recommend a pelvic exam and a Pap test every three years. For women ages 79-65, they may recommend the Pap test and pelvic exam, combined with testing for human papilloma virus (HPV), every five years. Some types of HPV increase your risk of cervical cancer. Testing for HPV may also be done on women of any age who have unclear Pap test results.  Other health care providers may not recommend any screening for nonpregnant women who are considered low risk for pelvic cancer and have no symptoms. Ask your health care provider if a screening pelvic exam is right for you.  If you have had past treatment for cervical cancer or a condition that could lead to cancer, you need Pap tests and screening for cancer for at least 20 years after your treatment. If Pap tests have been discontinued for you, your risk factors (such as having a new sexual partner) need to be  reassessed to determine if you should start having screenings again. Some women have medical problems that increase the chance of getting cervical cancer. In these cases, your health care provider may recommend that you have screening and Pap tests more often.  If you have a family history of uterine cancer or ovarian cancer, talk with your health care provider about genetic screening.  If you have vaginal bleeding after reaching menopause, tell your health care provider.  There are currently no reliable tests available to screen for ovarian cancer.  Lung Cancer Lung cancer screening is recommended for adults 69-62 years old who are at high risk for lung cancer because of a history of smoking. A yearly low-dose CT scan of the lungs is recommended if you:  Currently smoke.  Have a history of at least 30 pack-years of smoking and you currently smoke or have quit within the past 15 years. A pack-year is smoking an average of one pack of cigarettes per day for one year.  Yearly screening should:  Continue until it has been 15 years since you quit.  Stop if you develop a health problem that would prevent you from having lung cancer treatment.  Colorectal Cancer  This type of cancer can be detected and can often be prevented.  Routine colorectal cancer screening usually begins at  age 42 and continues through age 45.  If you have risk factors for colon cancer, your health care provider may recommend that you be screened at an earlier age.  If you have a family history of colorectal cancer, talk with your health care provider about genetic screening.  Your health care provider may also recommend using home test kits to check for hidden blood in your stool.  A small camera at the end of a tube can be used to examine your colon directly (sigmoidoscopy or colonoscopy). This is done to check for the earliest forms of colorectal cancer.  Direct examination of the colon should be repeated every  5-10 years until age 71. However, if early forms of precancerous polyps or small growths are found or if you have a family history or genetic risk for colorectal cancer, you may need to be screened more often.  Skin Cancer  Check your skin from head to toe regularly.  Monitor any moles. Be sure to tell your health care provider: ? About any new moles or changes in moles, especially if there is a change in a mole's shape or color. ? If you have a mole that is larger than the size of a pencil eraser.  If any of your family members has a history of skin cancer, especially at a young age, talk with your health care provider about genetic screening.  Always use sunscreen. Apply sunscreen liberally and repeatedly throughout the day.  Whenever you are outside, protect yourself by wearing long sleeves, pants, a wide-brimmed hat, and sunglasses.  What should I know about osteoporosis? Osteoporosis is a condition in which bone destruction happens more quickly than new bone creation. After menopause, you may be at an increased risk for osteoporosis. To help prevent osteoporosis or the bone fractures that can happen because of osteoporosis, the following is recommended:  If you are 46-71 years old, get at least 1,000 mg of calcium and at least 600 mg of vitamin D per day.  If you are older than age 55 but younger than age 65, get at least 1,200 mg of calcium and at least 600 mg of vitamin D per day.  If you are older than age 54, get at least 1,200 mg of calcium and at least 800 mg of vitamin D per day.  Smoking and excessive alcohol intake increase the risk of osteoporosis. Eat foods that are rich in calcium and vitamin D, and do weight-bearing exercises several times each week as directed by your health care provider. What should I know about how menopause affects my mental health? Depression may occur at any age, but it is more common as you become older. Common symptoms of depression  include:  Low or sad mood.  Changes in sleep patterns.  Changes in appetite or eating patterns.  Feeling an overall lack of motivation or enjoyment of activities that you previously enjoyed.  Frequent crying spells.  Talk with your health care provider if you think that you are experiencing depression. What should I know about immunizations? It is important that you get and maintain your immunizations. These include:  Tetanus, diphtheria, and pertussis (Tdap) booster vaccine.  Influenza every year before the flu season begins.  Pneumonia vaccine.  Shingles vaccine.  Your health care provider may also recommend other immunizations. This information is not intended to replace advice given to you by your health care provider. Make sure you discuss any questions you have with your health care provider. Document Released: 05/05/2005  Document Revised: 10/01/2015 Document Reviewed: 12/15/2014 [employer] Interactive Patient Education  2018 [employer]  

## 2017-12-18 NOTE — Progress Notes (Signed)
Patient: Jean Gonzales, Female    DOB: [birth year], 62 y.o.   MRN: [id number] Visit Date: 12/18/2017  Today's Provider: Mar Daring, PA-C   Chief Complaint  Patient presents with  . Annual Exam   Subjective:    Annual physical exam Jean Gonzales is a 62 y.o. female who presents today for health maintenance and complete physical. She feels fairly well.  She is still having pain from her knee replacement in 06/2017.   She reports not exercising secondary to knee pain.   She reports she is sleeping fairly well.  When she takes her sleeping medication. -----------------------------------------------------------------   Review of Systems  Constitutional: Positive for activity change. Negative for appetite change, chills, diaphoresis, fatigue, fever and unexpected weight change.  HENT: Negative.   Eyes: Negative.   Respiratory: Negative.   Cardiovascular: Positive for leg swelling. Negative for chest pain and palpitations.  Gastrointestinal: Positive for abdominal pain. Negative for abdominal distention, anal bleeding, blood in stool, constipation, diarrhea, nausea, rectal pain and vomiting.  Endocrine: Negative.   Genitourinary: Negative.   Musculoskeletal: Positive for arthralgias, back pain, gait problem, neck pain and neck stiffness. Negative for joint swelling and myalgias.  Skin: Negative.   Allergic/Immunologic: Negative.   Neurological: Negative for dizziness, tremors, seizures, syncope, facial asymmetry, speech difficulty, weakness, light-headedness, numbness and headaches.  Hematological: Negative.   Psychiatric/Behavioral: Positive for sleep disturbance. Negative for agitation, behavioral problems, confusion, decreased concentration, dysphoric mood, hallucinations, self-injury and suicidal ideas. The patient is nervous/anxious. The patient is not hyperactive.     Social History      She  reports that she has never smoked. She has never used  smokeless tobacco. She reports that she does not drink alcohol or use drugs.       Social History   Socioeconomic History  . Marital status: Divorced    Spouse name: Not on file  . Number of children: Not on file  . Years of education: Not on file  . Highest education level: Not on file  Occupational History  . Not on file  Social Needs  . Financial resource strain: Not on file  . Food insecurity:    Worry: Not on file    Inability: Not on file  . Transportation needs:    Medical: Not on file    Non-medical: Not on file  Tobacco Use  . Smoking status: Never Smoker  . Smokeless tobacco: Never Used  Substance and Sexual Activity  . Alcohol use: No    Alcohol/week: 0.0 standard drinks  . Drug use: No  . Sexual activity: Not Currently  Lifestyle  . Physical activity:    Days per week: Not on file    Minutes per session: Not on file  . Stress: Not on file  Relationships  . Social connections:    Talks on phone: Not on file    Gets together: Not on file    Attends religious service: Not on file    Active member of club or organization: Not on file    Attends meetings of clubs or organizations: Not on file    Relationship status: Not on file  Other Topics Concern  . Not on file  Social History Narrative  . Not on file    Past Medical History:  Diagnosis Date  . Arthritis    osteoarthritis right knee  . Cancer [facility]) 2014   endometrial CA  . Diabetes mellitus without complication ([place])   .  Hyperlipidemia   . Hypertension      Patient Active Problem List   Diagnosis Date Noted  . Chest pain 09/29/2017  . History of total knee arthroplasty 08/01/2017  . Osteoarthritis of right knee 07/09/2017  . Osteoarthritis 05/07/2017  . Synovitis of knee 12/29/2016  . Knee joint effusion 12/29/2016  . Malignant neoplasm of endometrium ([place]) 02/22/2015  . Acid reflux 02/22/2015  . History of migraine headaches 02/22/2015  . HLD (hyperlipidemia) 02/22/2015  . BP (high  blood pressure) 02/22/2015  . History of colon polyps 02/22/2015  . Urinary incontinence 02/22/2015  . Psoriatic arthritis ([place]) 02/22/2015  . Diabetes mellitus ([place]) 10/06/2014  . Urethral stenosis 12/03/2013  . Rheumatoid arthritis ([place]) 03/14/2013    Past Surgical History:  Procedure Laterality Date  . ABDOMINAL HYSTERECTOMY  2015   cancer  . BREAST BIOPSY Right 2008ish   core  . CHOLECYSTECTOMY  1992  . TONSILLECTOMY  1973  . TONSILLECTOMY    . TOTAL KNEE ARTHROPLASTY Right 07/09/2017   Procedure: TOTAL KNEE ARTHROPLASTY;  Surgeon: Lovell Sheehan, MD;  Location: [facility] ORS;  Service: Orthopedics;  Laterality: Right;    Family History        Family Status  Relation Name Status  . Mother  Deceased at age 28  . Father  Deceased at age 76       died of cancer  . Neg Hx  (Not Specified)        Her family history includes Cancer in her mother; Diabetes in her mother; Heart disease in her father. There is no history of Breast cancer, Kidney cancer, or Bladder Cancer.      Allergies  Allergen Reactions  . Iodinated Diagnostic Agents Anaphylaxis  . Iodine Swelling     Current Outpatient Medications:  .  acetaminophen (TYLENOL) 325 MG tablet, Take 1-2 tablets (325-650 mg total) by mouth every 6 (six) hours as needed for mild pain (pain score 1-3 or temp > 100.5)., Disp: 30 tablet, Rfl: 0 .  aspirin EC 81 MG tablet, Take 81 mg by mouth daily., Disp: , Rfl:  .  atorvastatin (LIPITOR) 40 MG tablet, TAKE 1 TABLET (40 MG) BY MOUTH EVERY DAY IN THE MORNING., Disp: 90 tablet, Rfl: 1 .  celecoxib (CELEBREX) 100 MG capsule, TAKE 1 CAPSULE BY MOUTH TWICE A DAY, Disp: 60 capsule, Rfl: 5 .  diclofenac sodium (VOLTAREN) 1 % GEL, Apply 2 g topically 4 (four) times daily. APPLIED TO KNEES., Disp: 400 g, Rfl: 3 .  furosemide (LASIX) 20 MG tablet, Take 1 tablet (20 mg total) by mouth 2 (two) times daily., Disp: 180 tablet, Rfl: 1 .  gabapentin (NEURONTIN) 300 MG capsule, Take 1 capsule (300 mg  total) by mouth 3 (three) times daily., Disp: 60 capsule, Rfl: 0 .  hydrochlorothiazide (HYDRODIURIL) 25 MG tablet, TAKE 1 TABLET BY MOUTH EVERY DAY, Disp: 90 tablet, Rfl: 1 .  NEXIUM 40 MG capsule, Take 1 capsule (40 mg total) by mouth daily. Brand preferred by patient (Patient taking differently: Take 40 mg by mouth daily before breakfast. Brand preferred by patient), Disp: 30 capsule, Rfl: 3 .  tamsulosin (FLOMAX) 0.4 MG CAPS capsule, Take 1 capsule (0.4 mg total) by mouth daily., Disp: 90 capsule, Rfl: 3 .  zolpidem (AMBIEN) 10 MG tablet, Take 1 tablet (10 mg total) by mouth at bedtime as needed for sleep., Disp: 30 tablet, Rfl: 5 .  aspirin 325 MG tablet, Take 325 mg by mouth daily., Disp: , Rfl:  Patient Care Team: Mar Daring, PA-C as PCP - General (Family Medicine)      Objective:   Vitals: BP 126/82 (BP Location: Left Arm, Patient Position: Sitting, Cuff Size: Large)   Pulse 86   Temp 98.2 F (36.8 C) (Oral)   Wt 250 lb (113.4 kg)   SpO2 95%   BMI 39.16 kg/m    Vitals:   12/18/17 1501  BP: 126/82  Pulse: 86  Temp: 98.2 F (36.8 C)  TempSrc: Oral  SpO2: 95%  Weight: 250 lb (113.4 kg)     Physical Exam  Constitutional: She is oriented to person, place, and time. She appears well-developed and well-nourished. No distress.  HENT:  Head: Normocephalic and atraumatic.  Right Ear: Hearing, tympanic membrane, external ear and ear canal normal.  Left Ear: Hearing, tympanic membrane, external ear and ear canal normal.  Nose: Nose normal.  Mouth/Throat: Uvula is midline, oropharynx is clear and moist and mucous membranes are normal. No oropharyngeal exudate.  Eyes: Pupils are equal, round, and reactive to light. Conjunctivae and EOM are normal. Right eye exhibits no discharge. Left eye exhibits no discharge. No scleral icterus.  Neck: Normal range of motion. Neck supple. No JVD present. Carotid bruit is not present. No tracheal deviation present. No thyromegaly  present.  Cardiovascular: Normal rate, regular rhythm, normal heart sounds and intact distal pulses. Exam reveals no gallop and no friction rub.  No murmur heard. Pulmonary/Chest: Effort normal and breath sounds normal. No respiratory distress. She has no wheezes. She has no rales. She exhibits no tenderness.  Abdominal: Soft. Bowel sounds are normal. She exhibits no distension and no mass. There is no tenderness. There is no rebound and no guarding.  Musculoskeletal: Normal range of motion. She exhibits no edema or tenderness.  Lymphadenopathy:    She has no cervical adenopathy.  Neurological: She is alert and oriented to person, place, and time.  Skin: Skin is warm and dry. No rash noted. She is not diaphoretic.  Psychiatric: She has a normal mood and affect. Her behavior is normal. Judgment and thought content normal.  Vitals reviewed.    Depression Screen PHQ 2/9 Scores 06/25/2017 04/12/2017  PHQ - 2 Score 0 6  PHQ- 9 Score 5 26      Assessment & Plan:     Routine Health Maintenance and Physical Exam  Exercise Activities and Dietary recommendations Goals   None     Immunization History  Administered Date(s) Administered  . Pneumococcal Polysaccharide-23 12/05/2010    Health Maintenance  Topic Date Due  . Hepatitis C Screening  05-Oct-1955  . FOOT EXAM  12/18/1963  . OPHTHALMOLOGY EXAM  12/18/1963  . TETANUS/TDAP  12/17/1972  . COLONOSCOPY  12/18/2003  . PAP SMEAR  11/20/2016  . INFLUENZA VACCINE  10/25/2017  . HEMOGLOBIN A1C  12/25/2017  . MAMMOGRAM  03/21/2018  . URINE MICROALBUMIN  06/26/2018  . PNEUMOCOCCAL POLYSACCHARIDE VACCINE AGE 25-64 HIGH RISK  Completed  . HIV Screening  Completed     Discussed health benefits of physical activity, and encouraged her to engage in regular exercise appropriate for her age and condition.    1. Annual physical exam Normal physical exam today. Will check labs as below and f/u pending lab results. If labs are stable and  WNL she will not need to have these rechecked for one year at her next annual physical exam. She is to call the office in the meantime if she has any acute issue, questions or concerns.  2. Breast cancer screening There is no family history of breast cancer. She does perform regular self breast exams. Mammogram was ordered as below. Information for [facility] clinic was given to patient so she may schedule her mammogram at her convenience.  3. BMI 39.0-39.9,adult Counseled patient on healthy lifestyle modifications including dieting and exercise.  - CBC w/Diff/Platelet - Comprehensive Metabolic Panel (CMET) - TSH - Lipid Profile - HgB A1c  4. Essential hypertension Stable. Continue HCTZ 25mg  and furosemide 20mg . Will check labs as below and f/u pending results. - CBC w/Diff/Platelet - Comprehensive Metabolic Panel (CMET) - TSH - Lipid Profile - HgB A1c  5. Type 2 diabetes mellitus with diabetic polyneuropathy, without long-term current use of insulin (HCC) Stable. Diet controlled. Will check labs as below and f/u pending results. - CBC w/Diff/Platelet - Comprehensive Metabolic Panel (CMET) - TSH - Lipid Profile - HgB A1c  6. Primary osteoarthritis of right knee S/P knee replacement. Uses Voltaren gel prn.   7. Psoriatic arthritis ([place]) H/O this. Uses celebrex 100mg  and volteren gel prn.  - TSH  8. Rheumatoid arthritis involving multiple sites, unspecified rheumatoid factor presence ([place]) Will check labs as below and f/u pending results. - TSH  9. Malignant neoplasm of endometrium ([place]) Followed by GYN oncology.  10. Pure hypercholesterolemia Stable. Continue atorvastatin 40mg . Will check labs as below and f/u pending results. - Comprehensive Metabolic Panel (CMET) - Lipid Profile - HgB A1c  11. History of total right knee replacement Improving. Followed by orthopedics.   12. Need for influenza vaccination Flu vaccine given today without complication. Patient  sat upright for 15 minutes to check for adverse reaction before being released. - Flu Vaccine QUAD 36+ mos IM  --------------------------------------------------------------------    Mar Daring, PA-C  [facility]

## 2017-12-19 LAB — COMPREHENSIVE METABOLIC PANEL
ALBUMIN: 4.4 g/dL (ref 3.6–4.8)
ALT: 21 IU/L (ref 0–32)
AST: 20 IU/L (ref 0–40)
Albumin/Globulin Ratio: 1.6 (ref 1.2–2.2)
Alkaline Phosphatase: 123 IU/L — ABNORMAL HIGH (ref 39–117)
BUN/Creatinine Ratio: 15 (ref 12–28)
BUN: 13 mg/dL (ref 8–27)
Bilirubin Total: 0.3 mg/dL (ref 0.0–1.2)
CALCIUM: 9.4 mg/dL (ref 8.7–10.3)
CO2: 23 mmol/L (ref 20–29)
Chloride: 100 mmol/L (ref 96–106)
Creatinine, Ser: 0.87 mg/dL (ref 0.57–1.00)
GFR calc Af Amer: 81 mL/min/{1.73_m2} (ref >59)
GFR, EST NON AFRICAN AMERICAN: 71 mL/min/{1.73_m2} (ref >59)
GLOBULIN, TOTAL: 2.7 g/dL (ref 1.5–4.5)
GLUCOSE: 192 mg/dL — AB (ref 65–99)
Potassium: 4 mmol/L (ref 3.5–5.2)
SODIUM: 139 mmol/L (ref 134–144)
Total Protein: 7.1 g/dL (ref 6.0–8.5)

## 2017-12-19 LAB — CBC WITH DIFFERENTIAL/PLATELET
BASOS ABS: 0.1 10*3/uL (ref 0.0–0.2)
Basos: 1 %
EOS (ABSOLUTE): 0.2 10*3/uL (ref 0.0–0.4)
Eos: 2 %
Hematocrit: 38.1 % (ref 34.0–46.6)
Hemoglobin: 12.4 g/dL (ref 11.1–15.9)
IMMATURE GRANS (ABS): 0 10*3/uL (ref 0.0–0.1)
Immature Granulocytes: 0 %
LYMPHS: 26 %
Lymphocytes Absolute: 2.4 10*3/uL (ref 0.7–3.1)
MCH: 28.4 pg (ref 26.6–33.0)
MCHC: 32.5 g/dL (ref 31.5–35.7)
MCV: 87 fL (ref 79–97)
MONOCYTES: 9 %
Monocytes Absolute: 0.8 10*3/uL (ref 0.1–0.9)
NEUTROS ABS: 5.8 10*3/uL (ref 1.4–7.0)
Neutrophils: 62 %
PLATELETS: 247 10*3/uL (ref 150–450)
RBC: 4.36 x10E6/uL (ref 3.77–5.28)
RDW: 12.8 % (ref 12.3–15.4)
WBC: 9.3 10*3/uL (ref 3.4–10.8)

## 2017-12-19 LAB — LIPID PANEL
CHOLESTEROL TOTAL: 171 mg/dL (ref 100–199)
Chol/HDL Ratio: 4.4 ratio (ref 0.0–4.4)
HDL: 39 mg/dL — ABNORMAL LOW (ref >39)
LDL Calculated: 78 mg/dL (ref 0–99)
Triglycerides: 272 mg/dL — ABNORMAL HIGH (ref 0–149)
VLDL Cholesterol Cal: 54 mg/dL — ABNORMAL HIGH (ref 5–40)

## 2017-12-19 LAB — TSH: TSH: 1.58 u[IU]/mL (ref 0.450–4.500)

## 2017-12-19 LAB — HEMOGLOBIN A1C
ESTIMATED AVERAGE GLUCOSE: 148 mg/dL
Hgb A1c MFr Bld: 6.8 % — ABNORMAL HIGH (ref 4.8–5.6)

## 2017-12-21 ENCOUNTER — Other Ambulatory Visit: Payer: Self-pay | Admitting: Physician Assistant

## 2017-12-21 ENCOUNTER — Telehealth: Payer: Self-pay | Admitting: Urology

## 2017-12-21 DIAGNOSIS — R339 Retention of urine, unspecified: Secondary | ICD-10-CM | POA: Diagnosis not present

## 2017-12-21 DIAGNOSIS — K2100 Gastro-esophageal reflux disease with esophagitis, without bleeding: Secondary | ICD-10-CM

## 2017-12-21 NOTE — Telephone Encounter (Signed)
Pt needs some catheters.  She is not sure what kind.  Office note says Straight catheters.  Please call pt [phone]

## 2017-12-21 NOTE — Telephone Encounter (Signed)
Called pt no answer, LM for pt informing her that she is always welcome to come by the office and get samples of caths, also advised pt that I will have [facility] order placed today for her caths, order will be scanned in.

## 2017-12-24 ENCOUNTER — Telehealth: Payer: Self-pay | Admitting: Urology

## 2017-12-24 ENCOUNTER — Telehealth: Payer: Self-pay | Admitting: Physician Assistant

## 2017-12-24 DIAGNOSIS — C541 Malignant neoplasm of endometrium: Secondary | ICD-10-CM | POA: Diagnosis not present

## 2017-12-24 NOTE — Telephone Encounter (Signed)
LMTCB

## 2017-12-24 NOTE — Telephone Encounter (Signed)
Pt called and said she spoke w/insurance co and they said the reason why RX for catheter was because they are not in network with [facility].  They are in network w/the following:  [facility] [phone]  (pt said insurance would pay at either of these places)  Medical billing code is [phone] for [facility] cath w/bag  Please give pt a call.

## 2017-12-24 NOTE — Telephone Encounter (Signed)
Pt asks needs a new updated Rx for her catheters sent to [facility] to [facility] - needs catheter that comes with a bag. Was given this catheter from our office. Please advise pt [phone].

## 2017-12-24 NOTE — Telephone Encounter (Signed)
Yes just call her, but she may need her urology notes more so.

## 2017-12-24 NOTE — Telephone Encounter (Signed)
Pt is needing a "special" cath.  She is needing to know some dates of when she was seen to get approval.  Please call pt back to discuss and get dates.  Please advise.  Thanks, [employer]

## 2017-12-24 NOTE — Telephone Encounter (Signed)
Last office visit notes should be good?

## 2017-12-25 NOTE — Telephone Encounter (Signed)
Pt called needing a note and office note sent to [facility] for the Catheter.  They need documentation her last visits and showing where she was given antibiotic for UTI.  The number for [facility] is [phone]  Thanks  teri

## 2017-12-27 NOTE — Telephone Encounter (Signed)
So I have not treated her for a UTI in a while. Larene [facility] prescribed the most recent antibiotic for her UTI. She may want to contact urology for this information.

## 2017-12-28 NOTE — Telephone Encounter (Signed)
LMTCB

## 2017-12-31 NOTE — Telephone Encounter (Signed)
Spoke with patient for 20 minutes on Friday and per patient she was going to check with the pharmacy but she remember's that [facility] treated her back in March or April of this year. If she finds out that she was treated than that's not good that we are not keeping a record. She also stated that she is going to check with her insurance.

## 2018-01-01 NOTE — Telephone Encounter (Signed)
Noted.   I checked and I last treated her in August. We did urine checks in January and April that were both negative. She was treated by Dr. Bernardo Heater in March with Cipro and by Tonye Pearson, PA-C in June-July.

## 2018-01-02 NOTE — Telephone Encounter (Signed)
Additional documentation as well as new RX sent in to [facility] received confirmation e-mail from [place].

## 2018-01-02 NOTE — Telephone Encounter (Signed)
Additional documentation as well as new RX sent in to [facility] received confirmation e-mail from [place].

## 2018-01-08 DIAGNOSIS — R339 Retention of urine, unspecified: Secondary | ICD-10-CM | POA: Diagnosis not present

## 2018-01-17 DIAGNOSIS — H2512 Age-related nuclear cataract, left eye: Secondary | ICD-10-CM | POA: Diagnosis not present

## 2018-01-18 ENCOUNTER — Telehealth: Payer: Self-pay

## 2018-01-18 NOTE — Telephone Encounter (Signed)
LMTCB We received a notice from [facility] that patient is scheduled for eye surgery on 11/12 and are asking for clearance.

## 2018-01-23 ENCOUNTER — Telehealth: Payer: Self-pay

## 2018-01-23 NOTE — Telephone Encounter (Signed)
Patient has chronic retention. 

## 2018-01-24 NOTE — Telephone Encounter (Signed)
Spoke with patient and reports that she already had this conversation with Tawanna Sat about how the EKG was read wrong and feels like she doesn't need an appointment and to run it by Anna.

## 2018-01-28 NOTE — Telephone Encounter (Signed)
Yes she has had cardiac work up and cleared. Form completed.

## 2018-01-28 NOTE — Telephone Encounter (Signed)
Patient advised.

## 2018-01-30 ENCOUNTER — Encounter: Payer: Self-pay | Admitting: *Deleted

## 2018-01-30 ENCOUNTER — Other Ambulatory Visit: Payer: Self-pay

## 2018-01-31 DIAGNOSIS — R339 Retention of urine, unspecified: Secondary | ICD-10-CM | POA: Diagnosis not present

## 2018-02-04 NOTE — Discharge Instructions (Signed)

## 2018-02-05 ENCOUNTER — Ambulatory Visit: Payer: Medicare HMO | Admitting: Anesthesiology

## 2018-02-05 ENCOUNTER — Ambulatory Visit
Admission: RE | Admit: 2018-02-05 | Discharge: 2018-02-05 | Disposition: A | Discharge status: Home / Self Care | Payer: Medicare HMO | Source: Ambulatory Visit | Attending: Ophthalmology | Admitting: Ophthalmology

## 2018-02-05 ENCOUNTER — Encounter
Admission: RE | Disposition: A | Discharge status: Home / Self Care | Payer: Self-pay | Source: Ambulatory Visit | Attending: Ophthalmology

## 2018-02-05 DIAGNOSIS — Z923 Personal history of irradiation: Secondary | ICD-10-CM | POA: Insufficient documentation

## 2018-02-05 DIAGNOSIS — E1140 Type 2 diabetes mellitus with diabetic neuropathy, unspecified: Secondary | ICD-10-CM | POA: Insufficient documentation

## 2018-02-05 DIAGNOSIS — Z9221 Personal history of antineoplastic chemotherapy: Secondary | ICD-10-CM | POA: Insufficient documentation

## 2018-02-05 DIAGNOSIS — H25811 Combined forms of age-related cataract, right eye: Secondary | ICD-10-CM | POA: Diagnosis not present

## 2018-02-05 DIAGNOSIS — M069 Rheumatoid arthritis, unspecified: Secondary | ICD-10-CM | POA: Insufficient documentation

## 2018-02-05 DIAGNOSIS — H2511 Age-related nuclear cataract, right eye: Secondary | ICD-10-CM | POA: Diagnosis not present

## 2018-02-05 HISTORY — DX: Ankylosing spondylitis of unspecified sites in spine: M45.9

## 2018-02-05 HISTORY — DX: Malignant neoplasm of endometrium: C54.1

## 2018-02-05 HISTORY — PX: CATARACT EXTRACTION W/PHACO: SHX586

## 2018-02-05 HISTORY — DX: Gastro-esophageal reflux disease without esophagitis: K21.9

## 2018-02-05 LAB — GLUCOSE, CAPILLARY
Glucose-Capillary: 130 mg/dL — ABNORMAL HIGH (ref 70–99)
Glucose-Capillary: 129 mg/dL — ABNORMAL HIGH (ref 70–99)

## 2018-02-05 SURGERY — PHACOEMULSIFICATION, CATARACT, WITH IOL INSERTION
Anesthesia: Monitor Anesthesia Care | Site: Eye | Laterality: Right

## 2018-02-05 MED ORDER — LIDOCAINE HCL (PF) 2 % IJ SOLN
INTRAOCULAR | Status: DC | PRN
  Administered 2018-02-05: 1 mL via INTRAOCULAR

## 2018-02-05 MED ORDER — TETRACAINE HCL 0.5 % OP SOLN
1.0000 [drp] | OPHTHALMIC | Status: DC | PRN
  Administered 2018-02-05 (×2): 1 [drp] via OPHTHALMIC

## 2018-02-05 MED ORDER — SODIUM HYALURONATE 10 MG/ML IO SOLN
INTRAOCULAR | Status: DC | PRN
  Administered 2018-02-05: 0.55 mL via INTRAOCULAR

## 2018-02-05 MED ORDER — EPINEPHRINE PF 1 MG/ML IJ SOLN
INTRAOCULAR | Status: DC | PRN
  Administered 2018-02-05: 71 mL via OPHTHALMIC

## 2018-02-05 MED ORDER — MOXIFLOXACIN HCL 0.5 % OP SOLN
OPHTHALMIC | Status: DC | PRN
Start: 2018-02-05 — End: 2018-02-05
  Administered 2018-02-05: 0.2 mL via OPHTHALMIC

## 2018-02-05 MED ORDER — MIDAZOLAM HCL 2 MG/2ML IJ SOLN
INTRAMUSCULAR | Status: DC | PRN
  Administered 2018-02-05: 2 mg via INTRAVENOUS

## 2018-02-05 MED ORDER — SODIUM HYALURONATE 23 MG/ML IO SOLN
INTRAOCULAR | Status: DC | PRN
  Administered 2018-02-05: 0.6 mL via INTRAOCULAR

## 2018-02-05 MED ORDER — FENTANYL CITRATE (PF) 100 MCG/2ML IJ SOLN
INTRAMUSCULAR | Status: DC | PRN
  Administered 2018-02-05: 100 ug via INTRAVENOUS

## 2018-02-05 MED ORDER — ARMC OPHTHALMIC DILATING DROPS
1.0000 "application " | OPHTHALMIC | Status: DC | PRN
  Administered 2018-02-05 (×3): 1 via OPHTHALMIC

## 2018-02-05 SURGICAL SUPPLY — 19 items
CANNULA ANT/CHMB 27G (MISCELLANEOUS) ×1 IMPLANT
CANNULA ANT/CHMB 27GA (MISCELLANEOUS) ×2 IMPLANT
DISSECTOR HYDRO NUCLEUS 50X22 (MISCELLANEOUS) ×2 IMPLANT
GLOVE SURG LX 7.5 STRW (GLOVE) ×1
GLOVE SURG LX STRL 7.5 STRW (GLOVE) ×1 IMPLANT
GLOVE SURG SYN 8.5  E (GLOVE) ×1
GLOVE SURG SYN 8.5 E (GLOVE) ×1 IMPLANT
GLOVE SURG SYN 8.5 PF PI (GLOVE) ×1 IMPLANT
GOWN STRL REUS W/ TWL LRG LVL3 (GOWN DISPOSABLE) ×2 IMPLANT
GOWN STRL REUS W/TWL LRG LVL3 (GOWN DISPOSABLE) ×4
LENS IOL TECNIS ITEC 8.0 (Intraocular Lens) ×1 IMPLANT
MARKER SKIN DUAL TIP RULER LAB (MISCELLANEOUS) ×2 IMPLANT
PACK DR. KING ARMS (PACKS) ×2 IMPLANT
PACK EYE AFTER SURG (MISCELLANEOUS) ×2 IMPLANT
PACK OPTHALMIC (MISCELLANEOUS) ×2 IMPLANT
SYR 3ML LL SCALE MARK (SYRINGE) ×2 IMPLANT
SYR TB 1ML LUER SLIP (SYRINGE) ×2 IMPLANT
WATER STERILE IRR 500ML POUR (IV SOLUTION) ×2 IMPLANT
WIPE NON LINTING 3.25X3.25 (MISCELLANEOUS) ×2 IMPLANT

## 2018-02-05 NOTE — Anesthesia Preprocedure Evaluation (Signed)
Anesthesia Evaluation  Patient identified by MRN, date of birth, ID band Patient awake    Reviewed: Allergy & Precautions, NPO status , Patient's Chart, lab work & pertinent test results, reviewed documented beta blocker date and time   Airway Mallampati: III  TM Distance: >3 FB Neck ROM: Full    Dental no notable dental hx.    Pulmonary PE   Pulmonary exam normal breath sounds clear to auscultation       Cardiovascular hypertension, Normal cardiovascular exam Rhythm:Regular Rate:Normal     Neuro/Psych negative neurological ROS  negative psych ROS   GI/Hepatic Neg liver ROS, GERD  ,  Endo/Other  diabetes  Renal/GU negative Renal ROS     Musculoskeletal  (+) Arthritis ,   Abdominal (+) + obese,   Peds  Hematology Uterine cancer s/p surgery, chemo   Anesthesia Other Findings   Reproductive/Obstetrics negative OB ROS                             Anesthesia Physical Anesthesia Plan  ASA: III  Anesthesia Plan: MAC   Post-op Pain Management:    Induction: Intravenous  PONV Risk Score and Plan:   Airway Management Planned: Natural Airway  Additional Equipment: None  Intra-op Plan:   Post-operative Plan:   Informed Consent: I have reviewed the patients History and Physical, chart, labs and discussed the procedure including the risks, benefits and alternatives for the proposed anesthesia with the patient or authorized representative who has indicated his/her understanding and acceptance.     Plan Discussed with: CRNA, Anesthesiologist and Surgeon  Anesthesia Plan Comments:         Anesthesia Quick Evaluation

## 2018-02-05 NOTE — Op Note (Signed)
OPERATIVE NOTE  [name] [id number] 02/05/2018   PREOPERATIVE DIAGNOSIS:  Nuclear sclerotic cataract right eye.  H25.11   POSTOPERATIVE DIAGNOSIS:    Nuclear sclerotic cataract right eye.     PROCEDURE:  Phacoemusification with posterior chamber intraocular lens placement of the right eye   LENS:   Implant Name Type Inv. Item Serial No. Manufacturer Lot No. LRB No. Used  LENS IOL DIOP 08.0 - [id number] Intraocular Lens LENS IOL DIOP 08.0 [id number]  Right 1       PCB00 +8.0   ULTRASOUND TIME: 0 minutes 38 seconds.  CDE 5.69   SURGEON:  Benay Pillow, MD, MPH  ANESTHESIOLOGIST: Anesthesiologist: Lavonna Monarch, MD CRNA: Izetta Dakin, CRNA   ANESTHESIA:  Topical with tetracaine drops augmented with 1% preservative-free intracameral lidocaine.  ESTIMATED BLOOD LOSS: less than 1 mL.   COMPLICATIONS:  None.   DESCRIPTION OF PROCEDURE:  The patient was identified in the holding room and transported to the operating room and placed in the supine position under the operating microscope.  The right eye was identified as the operative eye and it was prepped and draped in the usual sterile ophthalmic fashion.   A 1.0 millimeter clear-corneal paracentesis was made at the 10:30 position. 0.5 ml of preservative-free 1% lidocaine with epinephrine was injected into the anterior chamber.  The anterior chamber was filled with Healon 5 viscoelastic.  A 2.4 millimeter keratome was used to make a near-clear corneal incision at the 8:00 position.  A curvilinear capsulorrhexis was made with a cystotome and capsulorrhexis forceps.  Balanced salt solution was used to hydrodissect and hydrodelineate the nucleus.   Phacoemulsification was then used in stop and chop fashion to remove the lens nucleus and epinucleus.  The remaining cortex was then removed using the irrigation and aspiration handpiece. Healon was then placed into the capsular bag to distend it for lens placement.  A lens was  then injected into the capsular bag.  The remaining viscoelastic was aspirated.   Wounds were hydrated with balanced salt solution.  The anterior chamber was inflated to a physiologic pressure with balanced salt solution.   Intracameral vigamox 0.1 mL undiluted was injected into the eye and a drop placed onto the ocular surface.  No wound leaks were noted.  The patient was taken to the recovery room in stable condition without complications of anesthesia or surgery  Benay Pillow 02/05/2018, 8:03 AM

## 2018-02-05 NOTE — Transfer of Care (Signed)
Immediate Anesthesia Transfer of Care Note  Patient: Jean Gonzales  Procedure(s) Performed: CATARACT EXTRACTION PHACO AND INTRAOCULAR LENS PLACEMENT (IOC) RIGHT DIABETES IVA TOPICAL (Right Eye)  Patient Location: PACU  Anesthesia Type: MAC  Level of Consciousness: awake, alert  and patient cooperative  Airway and Oxygen Therapy: Patient Spontanous Breathing and Patient connected to supplemental oxygen  Post-op Assessment: Post-op Vital signs reviewed, Patient's Cardiovascular Status Stable, Respiratory Function Stable, Patent Airway and No signs of Nausea or vomiting  Post-op Vital Signs: Reviewed and stable  Complications: No apparent anesthesia complications

## 2018-02-05 NOTE — Anesthesia Procedure Notes (Signed)
Procedure Name: MAC Performed by: Izetta Dakin, CRNA Pre-anesthesia Checklist: Patient identified, Timeout performed, Patient being monitored, Suction available and Emergency Drugs available Patient Re-evaluated:Patient Re-evaluated prior to induction Oxygen Delivery Method: Nasal cannula

## 2018-02-05 NOTE — H&P (Signed)
The History and Physical notes are on paper, have been signed, and are to be scanned.   I have examined the patient and there are no changes to the H&P.   Benay Pillow 02/05/2018 7:22 AM

## 2018-02-05 NOTE — Anesthesia Postprocedure Evaluation (Signed)
Anesthesia Post Note  Patient: Jean Gonzales  Procedure(s) Performed: CATARACT EXTRACTION PHACO AND INTRAOCULAR LENS PLACEMENT (IOC) RIGHT DIABETES IVA TOPICAL (Right Eye)  Patient location during evaluation: PACU Anesthesia Type: MAC Level of consciousness: awake Pain management: pain level controlled Vital Signs Assessment: post-procedure vital signs reviewed and stable Respiratory status: spontaneous breathing Cardiovascular status: blood pressure returned to baseline Postop Assessment: no headache Anesthetic complications: no    Lavonna Monarch

## 2018-02-14 ENCOUNTER — Other Ambulatory Visit: Payer: Self-pay

## 2018-02-14 NOTE — Discharge Instructions (Signed)

## 2018-02-19 DIAGNOSIS — H2512 Age-related nuclear cataract, left eye: Secondary | ICD-10-CM | POA: Diagnosis not present

## 2018-02-22 NOTE — Anesthesia Preprocedure Evaluation (Addendum)
Anesthesia Evaluation  Patient identified by MRN, date of birth, ID band Patient awake    Reviewed: Allergy & Precautions, NPO status , Patient's Chart, lab work & pertinent test results  History of Anesthesia Complications Negative for: history of anesthetic complications  Airway Mallampati: IV   Neck ROM: Full    Dental  (+)    Pulmonary neg pulmonary ROS,    Pulmonary exam normal breath sounds clear to auscultation       Cardiovascular hypertension, Normal cardiovascular exam Rhythm:Regular Rate:Normal  Questionable PE 10/2017; not on anticoagulation   Neuro/Psych negative neurological ROS     GI/Hepatic GERD  ,  Endo/Other  diabetes, Type 2  Renal/GU      Musculoskeletal  (+) Arthritis  (psoriatic), Osteoarthritis and Rheumatoid disorders,  Ankylosing spondylitis   Abdominal   Peds  Hematology Endometrial cancer   Anesthesia Other Findings   Reproductive/Obstetrics                            Anesthesia Physical Anesthesia Plan  ASA: III  Anesthesia Plan: MAC   Post-op Pain Management:    Induction: Intravenous  PONV Risk Score and Plan: 2 and TIVA and Midazolam  Airway Management Planned: Natural Airway  Additional Equipment:   Intra-op Plan:   Post-operative Plan:   Informed Consent: I have reviewed the patients History and Physical, chart, labs and discussed the procedure including the risks, benefits and alternatives for the proposed anesthesia with the patient or authorized representative who has indicated his/her understanding and acceptance.     Plan Discussed with: CRNA  Anesthesia Plan Comments:        Anesthesia Quick Evaluation

## 2018-02-25 ENCOUNTER — Ambulatory Visit: Payer: Medicare HMO | Admitting: Anesthesiology

## 2018-02-25 ENCOUNTER — Ambulatory Visit
Admission: RE | Admit: 2018-02-25 | Discharge: 2018-02-25 | Disposition: A | Discharge status: Home / Self Care | Payer: Medicare HMO | Source: Ambulatory Visit | Attending: Ophthalmology | Admitting: Ophthalmology

## 2018-02-25 ENCOUNTER — Encounter
Admission: RE | Disposition: A | Discharge status: Home / Self Care | Payer: Self-pay | Source: Ambulatory Visit | Attending: Ophthalmology

## 2018-02-25 DIAGNOSIS — Z91041 Radiographic dye allergy status: Secondary | ICD-10-CM | POA: Diagnosis not present

## 2018-02-25 DIAGNOSIS — H2512 Age-related nuclear cataract, left eye: Secondary | ICD-10-CM | POA: Insufficient documentation

## 2018-02-25 DIAGNOSIS — M069 Rheumatoid arthritis, unspecified: Secondary | ICD-10-CM | POA: Diagnosis not present

## 2018-02-25 DIAGNOSIS — I10 Essential (primary) hypertension: Secondary | ICD-10-CM | POA: Insufficient documentation

## 2018-02-25 DIAGNOSIS — E1136 Type 2 diabetes mellitus with diabetic cataract: Secondary | ICD-10-CM | POA: Diagnosis not present

## 2018-02-25 DIAGNOSIS — E1140 Type 2 diabetes mellitus with diabetic neuropathy, unspecified: Secondary | ICD-10-CM | POA: Diagnosis not present

## 2018-02-25 DIAGNOSIS — M1990 Unspecified osteoarthritis, unspecified site: Secondary | ICD-10-CM | POA: Insufficient documentation

## 2018-02-25 DIAGNOSIS — K219 Gastro-esophageal reflux disease without esophagitis: Secondary | ICD-10-CM | POA: Insufficient documentation

## 2018-02-25 DIAGNOSIS — M459 Ankylosing spondylitis of unspecified sites in spine: Secondary | ICD-10-CM | POA: Diagnosis not present

## 2018-02-25 DIAGNOSIS — E7800 Pure hypercholesterolemia, unspecified: Secondary | ICD-10-CM | POA: Diagnosis not present

## 2018-02-25 DIAGNOSIS — H25812 Combined forms of age-related cataract, left eye: Secondary | ICD-10-CM | POA: Diagnosis not present

## 2018-02-25 HISTORY — PX: CATARACT EXTRACTION W/PHACO: SHX586

## 2018-02-25 LAB — GLUCOSE, CAPILLARY: Glucose-Capillary: 113 mg/dL — ABNORMAL HIGH (ref 70–99)

## 2018-02-25 SURGERY — PHACOEMULSIFICATION, CATARACT, WITH IOL INSERTION
Anesthesia: Monitor Anesthesia Care | Site: Eye | Laterality: Left

## 2018-02-25 MED ORDER — MOXIFLOXACIN HCL 0.5 % OP SOLN
OPHTHALMIC | Status: DC | PRN
  Administered 2018-02-25: 0.2 mL via OPHTHALMIC

## 2018-02-25 MED ORDER — TETRACAINE HCL 0.5 % OP SOLN
1.0000 [drp] | OPHTHALMIC | Status: DC | PRN
  Administered 2018-02-25 (×2): 1 [drp] via OPHTHALMIC

## 2018-02-25 MED ORDER — FENTANYL CITRATE (PF) 100 MCG/2ML IJ SOLN
INTRAMUSCULAR | Status: DC | PRN
  Administered 2018-02-25: 50 ug via INTRAVENOUS
  Administered 2018-02-25 (×2): 25 ug via INTRAVENOUS

## 2018-02-25 MED ORDER — ARMC OPHTHALMIC DILATING DROPS
1.0000 "application " | OPHTHALMIC | Status: DC | PRN
  Administered 2018-02-25 (×3): 1 via OPHTHALMIC

## 2018-02-25 MED ORDER — LACTATED RINGERS IV SOLN: INTRAVENOUS | Status: DC

## 2018-02-25 MED ORDER — SODIUM HYALURONATE 10 MG/ML IO SOLN
INTRAOCULAR | Status: DC | PRN
  Administered 2018-02-25: 0.55 mL via INTRAOCULAR

## 2018-02-25 MED ORDER — SODIUM HYALURONATE 23 MG/ML IO SOLN
INTRAOCULAR | Status: DC | PRN
  Administered 2018-02-25: 0.6 mL via INTRAOCULAR

## 2018-02-25 MED ORDER — LIDOCAINE HCL (PF) 2 % IJ SOLN
INTRAOCULAR | Status: DC | PRN
  Administered 2018-02-25: 1 mL via INTRAOCULAR

## 2018-02-25 MED ORDER — EPINEPHRINE PF 1 MG/ML IJ SOLN
INTRAOCULAR | Status: DC | PRN
  Administered 2018-02-25: 86 mL via OPHTHALMIC

## 2018-02-25 MED ORDER — MIDAZOLAM HCL 2 MG/2ML IJ SOLN
INTRAMUSCULAR | Status: DC | PRN
  Administered 2018-02-25: 2 mg via INTRAVENOUS

## 2018-02-25 SURGICAL SUPPLY — 19 items
CANNULA ANT/CHMB 27G (MISCELLANEOUS) ×2 IMPLANT
CANNULA ANT/CHMB 27GA (MISCELLANEOUS) ×4 IMPLANT
DISSECTOR HYDRO NUCLEUS 50X22 (MISCELLANEOUS) ×2 IMPLANT
GLOVE SURG LX 7.5 STRW (GLOVE) ×1
GLOVE SURG LX STRL 7.5 STRW (GLOVE) ×1 IMPLANT
GLOVE SURG SYN 8.5  E (GLOVE) ×1
GLOVE SURG SYN 8.5 E (GLOVE) ×1 IMPLANT
GLOVE SURG SYN 8.5 PF PI (GLOVE) ×1 IMPLANT
GOWN STRL REUS W/ TWL LRG LVL3 (GOWN DISPOSABLE) ×2 IMPLANT
GOWN STRL REUS W/TWL LRG LVL3 (GOWN DISPOSABLE) ×4
LENS IOL TECNIS ITEC 11.5 (Intraocular Lens) ×1 IMPLANT
MARKER SKIN DUAL TIP RULER LAB (MISCELLANEOUS) ×2 IMPLANT
PACK DR. KING ARMS (PACKS) ×2 IMPLANT
PACK EYE AFTER SURG (MISCELLANEOUS) ×2 IMPLANT
PACK OPTHALMIC (MISCELLANEOUS) ×2 IMPLANT
SYR 3ML LL SCALE MARK (SYRINGE) ×2 IMPLANT
SYR TB 1ML LUER SLIP (SYRINGE) ×2 IMPLANT
WATER STERILE IRR 500ML POUR (IV SOLUTION) ×2 IMPLANT
WIPE NON LINTING 3.25X3.25 (MISCELLANEOUS) ×2 IMPLANT

## 2018-02-25 NOTE — Transfer of Care (Signed)
Immediate Anesthesia Transfer of Care Note  Patient: Jean Gonzales  Procedure(s) Performed: CATARACT EXTRACTION PHACO AND INTRAOCULAR LENS PLACEMENT (IOC) LEFT DIABETIC (Left Eye)  Patient Location: PACU  Anesthesia Type: MAC  Level of Consciousness: awake, alert  and patient cooperative  Airway and Oxygen Therapy: Patient Spontanous Breathing and Patient connected to supplemental oxygen  Post-op Assessment: Post-op Vital signs reviewed, Patient's Cardiovascular Status Stable, Respiratory Function Stable, Patent Airway and No signs of Nausea or vomiting  Post-op Vital Signs: Reviewed and stable  Complications: No apparent anesthesia complications

## 2018-02-25 NOTE — Op Note (Signed)
OPERATIVE NOTE  Jean Gonzales [id number] 02/25/2018   PREOPERATIVE DIAGNOSIS:  Nuclear sclerotic cataract left eye.  H25.12   POSTOPERATIVE DIAGNOSIS:    Nuclear sclerotic cataract left eye.     PROCEDURE:  Phacoemusification with posterior chamber intraocular lens placement of the left eye   LENS:   Implant Name Type Inv. Item Serial No. Manufacturer Lot No. LRB No. Used  LENS IOL DIOP 11.5 - [id number] Intraocular Lens LENS IOL DIOP 11.5 [id number]  Left 1       PCB00 +11.5   ULTRASOUND TIME: 0 minutes 33 seconds.  CDE 2.56   SURGEON:  Benay Pillow, MD, MPH   ANESTHESIA:  Topical with tetracaine drops augmented with 1% preservative-free intracameral lidocaine.  ESTIMATED BLOOD LOSS: <1 mL   COMPLICATIONS:  None.   DESCRIPTION OF PROCEDURE:  The patient was identified in the holding room and transported to the operating room and placed in the supine position under the operating microscope.  The left eye was identified as the operative eye and it was prepped and draped in the usual sterile ophthalmic fashion.   A 1.0 millimeter clear-corneal paracentesis was made at the 5:00 position. 0.5 ml of preservative-free 1% lidocaine with epinephrine was injected into the anterior chamber.  The anterior chamber was filled with Healon 5 viscoelastic.  A 2.4 millimeter keratome was used to make a near-clear corneal incision at the 2:00 position.  A curvilinear capsulorrhexis was made with a cystotome and capsulorrhexis forceps.  Balanced salt solution was used to hydrodissect and hydrodelineate the nucleus.   Phacoemulsification was then used in stop and chop fashion to remove the lens nucleus and epinucleus.  The remaining cortex was then removed using the irrigation and aspiration handpiece. Healon was then placed into the capsular bag to distend it for lens placement.  A lens was then injected into the capsular bag.  The remaining viscoelastic was aspirated.   Wounds were  hydrated with balanced salt solution.  The anterior chamber was inflated to a physiologic pressure with balanced salt solution.  Intracameral vigamox 0.1 mL undiltued was injected into the eye and a drop placed onto the ocular surface.  No wound leaks were noted.  The patient was taken to the recovery room in stable condition without complications of anesthesia or surgery  Benay Pillow 02/25/2018, 8:04 AM

## 2018-02-25 NOTE — H&P (Signed)
The History and Physical notes are on paper, have been signed, and are to be scanned.   I have examined the patient and there are no changes to the H&P.   Benay Pillow 02/25/2018 7:21 AM

## 2018-02-25 NOTE — Anesthesia Procedure Notes (Signed)
Procedure Name: MAC Date/Time: 02/25/2018 7:46 AM Performed by: Jeannene Patella, CRNA Pre-anesthesia Checklist: Patient identified, Emergency Drugs available, Suction available, Patient being monitored and Timeout performed Patient Re-evaluated:Patient Re-evaluated prior to induction Oxygen Delivery Method: Nasal cannula

## 2018-02-25 NOTE — Anesthesia Postprocedure Evaluation (Signed)
Anesthesia Post Note  Patient: Jean Gonzales  Procedure(s) Performed: CATARACT EXTRACTION PHACO AND INTRAOCULAR LENS PLACEMENT (IOC) LEFT DIABETIC (Left Eye)  Patient location during evaluation: PACU Anesthesia Type: MAC Level of consciousness: awake and alert, oriented and patient cooperative Pain management: pain level controlled Vital Signs Assessment: post-procedure vital signs reviewed and stable Respiratory status: spontaneous breathing, nonlabored ventilation and respiratory function stable Cardiovascular status: blood pressure returned to baseline and stable Postop Assessment: adequate PO intake Anesthetic complications: no    Darrin Nipper

## 2018-02-26 ENCOUNTER — Encounter: Payer: Self-pay | Admitting: Ophthalmology

## 2018-03-02 ENCOUNTER — Other Ambulatory Visit: Payer: Self-pay | Admitting: Physician Assistant

## 2018-03-02 DIAGNOSIS — L4050 Arthropathic psoriasis, unspecified: Secondary | ICD-10-CM

## 2018-03-02 DIAGNOSIS — M069 Rheumatoid arthritis, unspecified: Secondary | ICD-10-CM

## 2018-03-11 ENCOUNTER — Other Ambulatory Visit: Payer: Self-pay | Admitting: Physician Assistant

## 2018-03-11 DIAGNOSIS — F5101 Primary insomnia: Secondary | ICD-10-CM

## 2018-03-11 DIAGNOSIS — M1711 Unilateral primary osteoarthritis, right knee: Secondary | ICD-10-CM

## 2018-03-29 NOTE — Progress Notes (Deleted)
Patient: Jean Gonzales Female    DOB: [birth year]   63 y.o.   MRN: [id number] Visit Date: 03/29/2018  Today's Provider: Mar Daring, PA-C   No chief complaint on file.  Subjective:     HPI    Diabetes Mellitus Type II, Follow-up:   Lab Results  Component Value Date   HGBA1C 6.8 (H) 12/18/2017   HGBA1C 6.2 06/25/2017   HGBA1C 6.1 10/24/2016   Last seen for diabetes 5 months ago.  Management since then includes. No changes. She reports {excellent/good/fair/poor:19665} compliance with treatment. She {ACTION; IS/IS [id number]} having side effects. *** Current symptoms include {Symptoms; diabetes:14075} and have been {Desc; course:15616}. Home blood sugar records: {diabetes glucometry results:16657}  Episodes of hypoglycemia? {yes***/no:17258}   Current Insulin Regimen: *** Most Recent Eye Exam: *** Weight trend: {trend:16658} Prior visit with dietician: {yes/no:17258} Current diet: {diet habits:16563} Current exercise: {exercise types:16438}  ------------------------------------------------------------------------   Hypertension, follow-up:  BP Readings from Last 3 Encounters:  02/25/18 121/75  02/05/18 124/73  12/18/17 126/82    She was last seen for hypertension 5 months ago.  BP at that visit was 126/82. Management since that visit includes; no changes.She reports {excellent/good/fair/poor:19665} compliance with treatment. She {ACTION; IS/IS [id number]} having side effects. *** She {is/is not:9024} exercising. She {is/is not:9024} adherent to low salt diet.   Outside blood pressures are ***. She is experiencing {Symptoms; cardiac:12860}.  Patient denies {Symptoms; cardiac:12860}.   Cardiovascular risk factors include {cv risk factors:510}.  Use of agents associated with hypertension: {bp agents assoc with hypertension:511::"none"}.   ------------------------------------------------------------------------    Lipid/Cholesterol,  Follow-up:   Last seen for this 5 months ago.  Management since that visit includes; labs checked, no changes.  Last Lipid Panel:    Component Value Date/Time   CHOL 171 12/18/2017 1621   TRIG 272 (H) 12/18/2017 1621   HDL 39 (L) 12/18/2017 1621   CHOLHDL 4.4 12/18/2017 1621   LDLCALC 78 12/18/2017 1621    She reports {excellent/good/fair/poor:19665} compliance with treatment. She {ACTION; IS/IS HQI:[id number]} having side effects. ***  Wt Readings from Last 3 Encounters:  02/25/18 246 lb (111.6 kg)  02/05/18 248 lb (112.5 kg)  12/18/17 250 lb (113.4 kg)    ------------------------------------------------------------------------    Allergies  Allergen Reactions  . Iodinated Diagnostic Agents Anaphylaxis     Current Outpatient Medications:  .  acetaminophen (TYLENOL) 325 MG tablet, Take 1-2 tablets (325-650 mg total) by mouth every 6 (six) hours as needed for mild pain (pain score 1-3 or temp > 100.5)., Disp: 30 tablet, Rfl: 0 .  aspirin EC 81 MG tablet, Take 81 mg by mouth daily., Disp: , Rfl:  .  atorvastatin (LIPITOR) 40 MG tablet, TAKE 1 TABLET (40 MG) BY MOUTH EVERY DAY IN THE MORNING., Disp: 90 tablet, Rfl: 1 .  celecoxib (CELEBREX) 100 MG capsule, TAKE 1 CAPSULE BY MOUTH TWICE A DAY, Disp: 180 capsule, Rfl: 1 .  diclofenac sodium (VOLTAREN) 1 % GEL, Apply 2 g topically 4 (four) times daily. Apply to knee, Disp: 400 g, Rfl: 3 .  esomeprazole (NEXIUM) 40 MG capsule, TAKE ONE CAPSULE BY MOUTH EVERY DAY, Disp: 90 capsule, Rfl: 1 .  furosemide (LASIX) 20 MG tablet, Take 1 tablet (20 mg total) by mouth 2 (two) times daily. (Patient not taking: Reported on 01/30/2018), Disp: 180 tablet, Rfl: 1 .  gabapentin (NEURONTIN) 300 MG capsule, Take 1 capsule (300 mg total) by mouth 3 (three) times daily. (Patient not taking: Reported on  02/14/2018), Disp: 60 capsule, Rfl: 0 .  hydrochlorothiazide (HYDRODIURIL) 25 MG tablet, TAKE 1 TABLET BY MOUTH EVERY DAY (Patient not taking: Reported  on 01/30/2018), Disp: 90 tablet, Rfl: 1 .  nystatin (MYCOSTATIN/NYSTOP) powder, Apply topically 4 (four) times daily., Disp: , Rfl:  .  tamsulosin (FLOMAX) 0.4 MG CAPS capsule, Take 1 capsule (0.4 mg total) by mouth daily., Disp: 90 capsule, Rfl: 3 .  Turmeric, Curcuma Longa, (CURCUMIN) POWD, by Does not apply route. With ginger and black seed, Disp: , Rfl:  .  zolpidem (AMBIEN) 10 MG tablet, TAKE 1 TABLET (10 MG TOTAL) BY MOUTH AT BEDTIME AS NEEDED FOR SLEEP., Disp: 90 tablet, Rfl: 1  Review of Systems  Constitutional: Negative for appetite change, chills, fatigue and fever.  Respiratory: Negative for chest tightness and shortness of breath.   Cardiovascular: Negative for chest pain and palpitations.  Gastrointestinal: Negative for abdominal pain, nausea and vomiting.  Neurological: Negative for dizziness and weakness.    Social History   Tobacco Use  . Smoking status: Never Smoker  . Smokeless tobacco: Never Used  Substance Use Topics  . Alcohol use: No    Alcohol/week: 0.0 standard drinks      Objective:   There were no vitals taken for this visit. There were no vitals filed for this visit.   Physical Exam      Assessment & [place], PA-C  [facility]

## 2018-04-01 ENCOUNTER — Ambulatory Visit: Payer: Self-pay | Admitting: Physician Assistant

## 2018-04-02 ENCOUNTER — Other Ambulatory Visit: Payer: Self-pay | Admitting: Physician Assistant

## 2018-04-02 DIAGNOSIS — R600 Localized edema: Secondary | ICD-10-CM

## 2018-04-05 DIAGNOSIS — R339 Retention of urine, unspecified: Secondary | ICD-10-CM | POA: Diagnosis not present

## 2018-04-17 NOTE — Progress Notes (Signed)
Patient: Jean Gonzales Female    DOB: January 11, 1956   63 y.o.   MRN: [id number] Visit Date: 04/19/2018  Today's Provider: Mar Daring, PA-C   Chief Complaint  Patient presents with  . Follow-up  . Diabetes  . Hypertension  . Hyperlipidemia   Subjective:     HPI    Diabetes Mellitus Type II, Follow-up:   Lab Results  Component Value Date   HGBA1C 6.8 (H) 12/18/2017   HGBA1C 6.2 06/25/2017   HGBA1C 6.1 10/24/2016   Last seen for diabetes 4 months ago.  Management since then includes; labs checked, advised diet and exercise. She reports good compliance with treatment. She is not having side effects. none Current symptoms include none and have been unchanged. Home blood sugar records: fasting range: not checking  Episodes of hypoglycemia? no   Current Insulin Regimen: n/a Most Recent Eye Exam: recently Weight trend: stable Prior visit with dietician: no Current diet: in general, an "unhealthy" diet Current exercise: PT  -----------------------------------------------------------------    Hypertension, follow-up:  BP Readings from Last 3 Encounters:  04/19/18 124/80  02/25/18 121/75  02/05/18 124/73    She was last seen for hypertension 4 months ago.  BP at that visit was 126/82. Management since that visit includes; labs checked, no changes.She reports good compliance with treatment. She is not having side effects. none She is not exercising. She is not adherent to low salt diet.   Outside blood pressures are normal. She is experiencing none.  Patient denies none.   Cardiovascular risk factors include diabetes mellitus.  Use of agents associated with hypertension: none.   -----------------------------------------------------------------    Lipid/Cholesterol, Follow-up:   Last seen for this 4 months ago.  Management since that visit includes; labs checked, advised diet and exercise.  Last Lipid Panel:    Component Value  Date/Time   CHOL 171 12/18/2017 1621   TRIG 272 (H) 12/18/2017 1621   HDL 39 (L) 12/18/2017 1621   CHOLHDL 4.4 12/18/2017 1621   LDLCALC 78 12/18/2017 1621    She reports good compliance with treatment. She is not having side effects. none  Wt Readings from Last 3 Encounters:  04/19/18 247 lb (112 kg)  02/25/18 246 lb (111.6 kg)  02/05/18 248 lb (112.5 kg)   ----------------------------------------------------------------     Allergies  Allergen Reactions  . Iodinated Diagnostic Agents Anaphylaxis     Current Outpatient Medications:  .  acetaminophen (TYLENOL) 325 MG tablet, Take 1-2 tablets (325-650 mg total) by mouth every 6 (six) hours as needed for mild pain (pain score 1-3 or temp > 100.5)., Disp: 30 tablet, Rfl: 0 .  aspirin EC 81 MG tablet, Take 81 mg by mouth daily., Disp: , Rfl:  .  atorvastatin (LIPITOR) 40 MG tablet, TAKE 1 TABLET (40 MG) BY MOUTH EVERY DAY IN THE MORNING., Disp: 90 tablet, Rfl: 1 .  celecoxib (CELEBREX) 100 MG capsule, TAKE 1 CAPSULE BY MOUTH TWICE A DAY, Disp: 180 capsule, Rfl: 1 .  diclofenac sodium (VOLTAREN) 1 % GEL, Apply 2 g topically 4 (four) times daily. Apply to knee, Disp: 400 g, Rfl: 3 .  esomeprazole (NEXIUM) 40 MG capsule, TAKE ONE CAPSULE BY MOUTH EVERY DAY, Disp: 90 capsule, Rfl: 1 .  nystatin (MYCOSTATIN/NYSTOP) powder, Apply topically 4 (four) times daily., Disp: , Rfl:  .  tamsulosin (FLOMAX) 0.4 MG CAPS capsule, Take 1 capsule (0.4 mg total) by mouth daily., Disp: 90 capsule, Rfl: 3 .  zolpidem (  AMBIEN) 10 MG tablet, TAKE 1 TABLET (10 MG TOTAL) BY MOUTH AT BEDTIME AS NEEDED FOR SLEEP., Disp: 90 tablet, Rfl: 1 .  furosemide (LASIX) 20 MG tablet, TAKE 1 TABLET BY MOUTH TWICE A DAY (Patient not taking: Reported on 04/19/2018), Disp: 180 tablet, Rfl: 1 .  gabapentin (NEURONTIN) 300 MG capsule, Take 1 capsule (300 mg total) by mouth 3 (three) times daily. (Patient not taking: Reported on 02/14/2018), Disp: 60 capsule, Rfl: 0 .   hydrochlorothiazide (HYDRODIURIL) 25 MG tablet, TAKE 1 TABLET BY MOUTH EVERY DAY (Patient not taking: Reported on 01/30/2018), Disp: 90 tablet, Rfl: 1 .  potassium chloride (K-DUR) 10 MEQ tablet, TAKE 1 TABLET BY MOUTH 2 TIMES DAILY. (Patient not taking: Reported on 04/19/2018), Disp: 180 tablet, Rfl: 1 .  Turmeric, Curcuma Longa, (CURCUMIN) POWD, by Does not apply route. With ginger and black seed, Disp: , Rfl:   Review of Systems  Constitutional: Negative for appetite change, chills, fatigue and fever.  Respiratory: Negative for chest tightness and shortness of breath.   Cardiovascular: Negative for chest pain and palpitations.  Gastrointestinal: Negative for abdominal pain, nausea and vomiting.  Genitourinary: Positive for dysuria and pelvic pain.  Neurological: Negative for dizziness and weakness.    Social History   Tobacco Use  . Smoking status: Never Smoker  . Smokeless tobacco: Never Used  Substance Use Topics  . Alcohol use: No    Alcohol/week: 0.0 standard drinks      Objective:   BP 124/80 (BP Location: Left Arm, Patient Position: Sitting, Cuff Size: Large)   Pulse 75   Resp 16   Wt 247 lb (112 kg)   SpO2 99%   BMI 38.69 kg/m  Vitals:   04/19/18 0830  BP: 124/80  Pulse: 75  Resp: 16  SpO2: 99%  Weight: 247 lb (112 kg)     Physical Exam Vitals signs reviewed.  Constitutional:      General: She is not in acute distress.    Appearance: She is well-developed. She is not diaphoretic.  Neck:     Musculoskeletal: Normal range of motion and neck supple.  Cardiovascular:     Rate and Rhythm: Normal rate and regular rhythm.     Heart sounds: Normal heart sounds. No murmur. No friction rub. No gallop.   Pulmonary:     Effort: Pulmonary effort is normal. No respiratory distress.     Breath sounds: Normal breath sounds. No wheezing or rales.        Assessment & Plan    1. Type 2 diabetes mellitus with diabetic polyneuropathy, without long-term current use of  insulin (HCC) A1c today in the office read too high to calculate. Labs ordered as below. I will f/u pending results. Patient reports she started keto diet back this week.  - POCT glycosylated hemoglobin (Hb A1C) - CBC w/Diff/Platelet - Comprehensive Metabolic Panel (CMET) - HgB A1c - Lipid Profile  2. Essential hypertension Stable today. Continue medical treatment plan. Will check labs as below and f/u pending results. - CBC w/Diff/Platelet - Comprehensive Metabolic Panel (CMET) - HgB A1c - Lipid Profile  3. Pure hypercholesterolemia Stable. Continue atorvastatin 40mg . Will check labs as below and f/u pending results. - CBC w/Diff/Platelet - Comprehensive Metabolic Panel (CMET) - HgB A1c - Lipid Profile  4. Impetigo Stable. Diagnosis pulled for medication refill. Continue current medical treatment plan. - nystatin (MYCOSTATIN/NYSTOP) powder; Apply topically 3 (three) times daily.  Dispense: 100 g; Refill: 5  5. Psoriasis Stable. Diagnosis pulled for  medication refill. Continue current medical treatment plan. - triamcinolone cream (KENALOG) 0.1 %; Apply 1 application topically 2 (two) times daily.  Dispense: 80 g; Refill: 3  6. Recurrent UTI Patient has to self cath due to urinary retention that was a complication after radiation for endometrial cancer. States she feels as if she has a UTI as she was leaving the office today. Urine collected. UA positive. Will treat empirically with Bactrim as below.  Continue to push fluids. Urine sent for culture. Will follow up pending C&S results. She is to call if symptoms do not improve or if they worsen.  - POCT Urinalysis Dipstick - CULTURE, URINE COMPREHENSIVE - sulfamethoxazole-trimethoprim (BACTRIM DS,SEPTRA DS) 800-160 MG tablet; Take 1 tablet by mouth 2 (two) times daily.  Dispense: 20 tablet; Refill: 0     Mar Daring, PA-C  [facility]

## 2018-04-19 ENCOUNTER — Ambulatory Visit (INDEPENDENT_AMBULATORY_CARE_PROVIDER_SITE_OTHER): Payer: Medicare HMO | Admitting: Physician Assistant

## 2018-04-19 ENCOUNTER — Encounter: Payer: Self-pay | Admitting: Physician Assistant

## 2018-04-19 VITALS — BP 124/80 | HR 75 | Resp 16 | Wt 247.0 lb

## 2018-04-19 DIAGNOSIS — I10 Essential (primary) hypertension: Secondary | ICD-10-CM

## 2018-04-19 DIAGNOSIS — L409 Psoriasis, unspecified: Secondary | ICD-10-CM

## 2018-04-19 DIAGNOSIS — N390 Urinary tract infection, site not specified: Secondary | ICD-10-CM

## 2018-04-19 DIAGNOSIS — E7800 Pure hypercholesterolemia, unspecified: Secondary | ICD-10-CM | POA: Diagnosis not present

## 2018-04-19 DIAGNOSIS — E1142 Type 2 diabetes mellitus with diabetic polyneuropathy: Secondary | ICD-10-CM

## 2018-04-19 DIAGNOSIS — L0100 Impetigo, unspecified: Secondary | ICD-10-CM

## 2018-04-19 DIAGNOSIS — Z6838 Body mass index (BMI) 38.0-38.9, adult: Secondary | ICD-10-CM

## 2018-04-19 LAB — POCT URINALYSIS DIPSTICK
Appearance: ABNORMAL
Bilirubin, UA: NEGATIVE
Glucose, UA: NEGATIVE
Ketones, UA: NEGATIVE
Nitrite, UA: NEGATIVE
ODOR: ABNORMAL
Protein, UA: NEGATIVE
Spec Grav, UA: 1.01 (ref 1.010–1.025)
UROBILINOGEN UA: 0.2 U/dL
pH, UA: 6 (ref 5.0–8.0)

## 2018-04-19 MED ORDER — NYSTATIN 100000 UNIT/GM EX POWD: Freq: Three times a day (TID) | CUTANEOUS | 5 refills | Status: DC

## 2018-04-19 MED ORDER — SULFAMETHOXAZOLE-TRIMETHOPRIM 800-160 MG PO TABS: 1.0000 | ORAL_TABLET | Freq: Two times a day (BID) | ORAL | 0 refills | Status: DC

## 2018-04-19 MED ORDER — TRIAMCINOLONE ACETONIDE 0.1 % EX CREA: 1.0000 "application " | TOPICAL_CREAM | Freq: Two times a day (BID) | CUTANEOUS | 3 refills | Status: DC

## 2018-04-19 NOTE — Patient Instructions (Signed)
Diabetes Basics    Diabetes (diabetes mellitus) is a long-term (chronic) disease. It occurs when the body does not properly use sugar (glucose) that is released from food after you eat.  Diabetes may be caused by one or both of these problems:  · Your pancreas does not make enough of a hormone called insulin.  · Your body does not react in a normal way to insulin that it makes.  Insulin lets sugars (glucose) go into cells in your body. This gives you energy. If you have diabetes, sugars cannot get into cells. This causes high blood sugar (hyperglycemia).  Follow these instructions at home:  How is diabetes treated?  You may need to take insulin or other diabetes medicines daily to keep your blood sugar in balance. Take your diabetes medicines every day as told by your doctor. List your diabetes medicines here:  Diabetes medicines  · Name of medicine: ______________________________  ? Amount (dose): _______________ Time (a.m./p.m.): _______________ Notes: ___________________________________  · Name of medicine: ______________________________  ? Amount (dose): _______________ Time (a.m./p.m.): _______________ Notes: ___________________________________  · Name of medicine: ______________________________  ? Amount (dose): _______________ Time (a.m./p.m.): _______________ Notes: ___________________________________  If you use insulin, you will learn how to give yourself insulin by injection. You may need to adjust the amount based on the food that you eat. List the types of insulin you use here:  Insulin  · Insulin type: ______________________________  ? Amount (dose): _______________ Time (a.m./p.m.): _______________ Notes: ___________________________________  · Insulin type: ______________________________  ? Amount (dose): _______________ Time (a.m./p.m.): _______________ Notes: ___________________________________  · Insulin type: ______________________________  ? Amount (dose): _______________ Time (a.m./p.m.):  _______________ Notes: ___________________________________  · Insulin type: ______________________________  ? Amount (dose): _______________ Time (a.m./p.m.): _______________ Notes: ___________________________________  · Insulin type: ______________________________  ? Amount (dose): _______________ Time (a.m./p.m.): _______________ Notes: ___________________________________  How do I manage my blood sugar?    Check your blood sugar levels using a blood glucose monitor as directed by your doctor.  Your doctor will set treatment goals for you. Generally, you should have these blood sugar levels:  · Before meals (preprandial): 80-130 mg/dL (4.4-7.2 mmol/L).  · After meals (postprandial): below 180 mg/dL (10 mmol/L).  · A1c level: less than 7%.  Write down the times that you will check your blood sugar levels:  Blood sugar checks  · Time: _______________ Notes: ___________________________________  · Time: _______________ Notes: ___________________________________  · Time: _______________ Notes: ___________________________________  · Time: _______________ Notes: ___________________________________  · Time: _______________ Notes: ___________________________________  · Time: _______________ Notes: ___________________________________    What do I need to know about low blood sugar?  Low blood sugar is called hypoglycemia. This is when blood sugar is at or below 70 mg/dL (3.9 mmol/L). Symptoms may include:  · Feeling:  ? Hungry.  ? Worried or nervous (anxious).  ? Sweaty and clammy.  ? Confused.  ? Dizzy.  ? Sleepy.  ? Sick to your stomach (nauseous).  · Having:  ? A fast heartbeat.  ? A headache.  ? A change in your vision.  ? Tingling or no feeling (numbness) around the mouth, lips, or tongue.  ? Jerky movements that you cannot control (seizure).  · Having trouble with:  ? Moving (coordination).  ? Sleeping.  ? Passing out (fainting).  ? Getting upset easily (irritability).  Treating low blood sugar  To treat low blood  sugar, eat or drink something sugary right away. If you can think clearly and swallow safely, follow the 15:15   rule:  · Take 15 grams of a fast-acting carb (carbohydrate). Talk with your doctor about how much you should take.  · Some fast-acting carbs are:  ? Sugar tablets (glucose pills). Take 3-4 glucose pills.  ? 6-8 pieces of hard candy.  ? 4-6 oz (120-150 mL) of fruit juice.  ? 4-6 oz (120-150 mL) of regular (not diet) soda.  ? 1 Tbsp (15 mL) honey or sugar.  · Check your blood sugar 15 minutes after you take the carb.  · If your blood sugar is still at or below 70 mg/dL (3.9 mmol/L), take 15 grams of a carb again.  · If your blood sugar does not go above 70 mg/dL (3.9 mmol/L) after 3 tries, get help right away.  · After your blood sugar goes back to normal, eat a meal or a snack within 1 hour.  Treating very low blood sugar  If your blood sugar is at or below 54 mg/dL (3 mmol/L), you have very low blood sugar (severe hypoglycemia). This is an emergency. Do not wait to see if the symptoms will go away. Get medical help right away. Call your local emergency services (911 in the [place] Do not drive yourself to the hospital.  Questions to ask your health care provider  · Do I need to meet with a diabetes educator?  · What equipment will I need to care for myself at home?  · What diabetes medicines do I need? When should I take them?  · How often do I need to check my blood sugar?  · What number can I call if I have questions?  · When is my next doctor's visit?  · Where can I find a support group for people with diabetes?  Where to find more information  · American Diabetes Association: www.diabetes.org  · American Association of Diabetes Educators: www.diabeteseducator.org/patient-resources  Contact a doctor if:  · Your blood sugar is at or above 240 mg/dL (13.3 mmol/L) for 2 days in a row.  · You have been sick or have had a fever for 2 days or more, and you are not getting better.  · You have any of these  problems for more than 6 hours:  ? You cannot eat or drink.  ? You feel sick to your stomach (nauseous).  ? You throw up (vomit).  ? You have watery poop (diarrhea).  Get help right away if:  · Your blood sugar is lower than 54 mg/dL (3 mmol/L).  · You get confused.  · You have trouble:  ? Thinking clearly.  ? Breathing.  Summary  · Diabetes (diabetes mellitus) is a long-term (chronic) disease. It occurs when the body does not properly use sugar (glucose) that is released from food after digestion.  · Take insulin and diabetes medicines as told.  · Check your blood sugar every day, as often as told.  · Keep all follow-up visits as told by your doctor. This is important.  This information is not intended to replace advice given to you by your health care provider. Make sure you discuss any questions you have with your health care provider.  Document Released: 06/15/2017 Document Revised: 09/03/2017 Document Reviewed: 06/15/2017  Elsevier Interactive Patient Education © 2019 [employer]

## 2018-04-20 LAB — COMPREHENSIVE METABOLIC PANEL
ALT: 25 IU/L (ref 0–32)
AST: 22 IU/L (ref 0–40)
Albumin/Globulin Ratio: 1.6 (ref 1.2–2.2)
Albumin: 4.4 g/dL (ref 3.8–4.8)
Alkaline Phosphatase: 118 IU/L — ABNORMAL HIGH (ref 39–117)
BILIRUBIN TOTAL: 0.4 mg/dL (ref 0.0–1.2)
BUN/Creatinine Ratio: 9 — ABNORMAL LOW (ref 12–28)
BUN: 9 mg/dL (ref 8–27)
CO2: 22 mmol/L (ref 20–29)
Calcium: 9.7 mg/dL (ref 8.7–10.3)
Chloride: 103 mmol/L (ref 96–106)
Creatinine, Ser: 1.02 mg/dL — ABNORMAL HIGH (ref 0.57–1.00)
GFR calc Af Amer: 67 mL/min/{1.73_m2} (ref >59)
GFR calc non Af Amer: 58 mL/min/{1.73_m2} — ABNORMAL LOW (ref >59)
Globulin, Total: 2.7 g/dL (ref 1.5–4.5)
Glucose: 111 mg/dL — ABNORMAL HIGH (ref 65–99)
Potassium: 4.3 mmol/L (ref 3.5–5.2)
Sodium: 141 mmol/L (ref 134–144)
Total Protein: 7.1 g/dL (ref 6.0–8.5)

## 2018-04-20 LAB — CBC WITH DIFFERENTIAL/PLATELET
Basophils Absolute: 0 10*3/uL (ref 0.0–0.2)
Basos: 0 %
EOS (ABSOLUTE): 0.2 10*3/uL (ref 0.0–0.4)
Eos: 2 %
Hematocrit: 40.5 % (ref 34.0–46.6)
Hemoglobin: 13.2 g/dL (ref 11.1–15.9)
Immature Grans (Abs): 0 10*3/uL (ref 0.0–0.1)
Immature Granulocytes: 0 %
LYMPHS ABS: 3.4 10*3/uL — AB (ref 0.7–3.1)
Lymphs: 37 %
MCH: 29.1 pg (ref 26.6–33.0)
MCHC: 32.6 g/dL (ref 31.5–35.7)
MCV: 89 fL (ref 79–97)
MONOS ABS: 0.7 10*3/uL (ref 0.1–0.9)
Monocytes: 7 %
NEUTROS PCT: 54 %
Neutrophils Absolute: 5 10*3/uL (ref 1.4–7.0)
PLATELETS: 234 10*3/uL (ref 150–450)
RBC: 4.53 x10E6/uL (ref 3.77–5.28)
RDW: 13 % (ref 11.7–15.4)
WBC: 9.3 10*3/uL (ref 3.4–10.8)

## 2018-04-20 LAB — LIPID PANEL
Chol/HDL Ratio: 7 ratio — ABNORMAL HIGH (ref 0.0–4.4)
Cholesterol, Total: 244 mg/dL — ABNORMAL HIGH (ref 100–199)
HDL: 35 mg/dL — ABNORMAL LOW (ref >39)
LDL Calculated: 138 mg/dL — ABNORMAL HIGH (ref 0–99)
Triglycerides: 355 mg/dL — ABNORMAL HIGH (ref 0–149)
VLDL Cholesterol Cal: 71 mg/dL — ABNORMAL HIGH (ref 5–40)

## 2018-04-20 LAB — HEMOGLOBIN A1C
Est. average glucose Bld gHb Est-mCnc: 151 mg/dL
Hgb A1c MFr Bld: 6.9 % — ABNORMAL HIGH (ref 4.8–5.6)

## 2018-04-23 ENCOUNTER — Telehealth: Payer: Self-pay | Admitting: Physician Assistant

## 2018-04-23 ENCOUNTER — Telehealth: Payer: Self-pay

## 2018-04-23 LAB — CULTURE, URINE COMPREHENSIVE

## 2018-04-23 LAB — SPECIMEN STATUS REPORT

## 2018-04-23 MED ORDER — NITROFURANTOIN MONOHYD MACRO 100 MG PO CAPS: 100.0000 mg | ORAL_CAPSULE | Freq: Two times a day (BID) | ORAL | 0 refills | Status: AC

## 2018-04-23 NOTE — Telephone Encounter (Signed)
Pt called stating she was prescribed Bactrim for an UTI.  She states after two doses she has a rash and itching.  She denies swelling shortness of breath.  I advised her not to take the next dose until she hears back from [place]. I also advised her that if she starts to experience the above symptom to have someone take her to the ER.  She agreed.   Pt uses [facility] in [place].   Please advise.   Thanks,   -Mickel Baas

## 2018-04-23 NOTE — Telephone Encounter (Signed)
°  Opened in error - [facility] °

## 2018-04-23 NOTE — Telephone Encounter (Signed)
Have sent prescription for macrobid to [facility] to take in place of bactrim.

## 2018-04-23 NOTE — Telephone Encounter (Signed)
Left message on vm that new rx sent to pharmacy and to stop other rx.  dbs

## 2018-04-24 ENCOUNTER — Telehealth: Payer: Self-pay | Admitting: *Deleted

## 2018-04-24 NOTE — Telephone Encounter (Signed)
Possibly but I would suspect she may be developing scar tissue again. I would advise to notify Urology to make sure she does not need to be seen.

## 2018-04-24 NOTE — Telephone Encounter (Signed)
-----   Message from Mar Daring, [place] sent at 04/24/2018  7:59 AM EST ----- Blood count is normal. Kidney function slightly decreased. Push fluids. Recheck kidney function in 2-3 weeks. Liver enzymes normal. A1c up to 6.9, was 6.8. Will recheck in 3 months after dieting to see if improving. Cholesterol also up from 4 months ago. Make sure to take atorvastatin as I think not taking that may be the cause of the drastic change in such a short time span. When you take the atorvastatin your cholesterol readings are better. Also I know you are on antibiotic for the UTI and saw it was changed. I will add bactrim to your allergy list.

## 2018-04-24 NOTE — Telephone Encounter (Signed)
Patient advised as directed below. Patient is going to call us before coming in to get labs done.  Patient has also a question for Tawanna Sat She reports that her cath is painful and is asking if this is because of the infection? She reports that it goes in and it feels like it just hit a wall.

## 2018-04-24 NOTE — Telephone Encounter (Signed)
LMOVM for pt to return call. Also see urine culture result.

## 2018-04-24 NOTE — Telephone Encounter (Signed)
Patient advised as below.  

## 2018-05-09 ENCOUNTER — Other Ambulatory Visit: Payer: Self-pay | Admitting: Physician Assistant

## 2018-05-09 DIAGNOSIS — I10 Essential (primary) hypertension: Secondary | ICD-10-CM

## 2018-05-14 ENCOUNTER — Telehealth: Payer: Self-pay | Admitting: Physician Assistant

## 2018-05-14 DIAGNOSIS — N289 Disorder of kidney and ureter, unspecified: Secondary | ICD-10-CM

## 2018-05-14 NOTE — Telephone Encounter (Signed)
Pt wants to come in on Feb 20th after 2 pm for blood work. Please have orders ready.  Thanks, [employer]

## 2018-05-15 NOTE — Telephone Encounter (Signed)
LM that lab requisition printed and ready when she gets here tomorrow.

## 2018-05-16 DIAGNOSIS — D3132 Benign neoplasm of left choroid: Secondary | ICD-10-CM | POA: Diagnosis not present

## 2018-05-21 DIAGNOSIS — N289 Disorder of kidney and ureter, unspecified: Secondary | ICD-10-CM | POA: Diagnosis not present

## 2018-05-22 ENCOUNTER — Telehealth: Payer: Self-pay

## 2018-05-22 DIAGNOSIS — N390 Urinary tract infection, site not specified: Secondary | ICD-10-CM

## 2018-05-22 LAB — RENAL FUNCTION PANEL
Albumin: 4.5 g/dL (ref 3.8–4.8)
BUN/Creatinine Ratio: 11 — ABNORMAL LOW (ref 12–28)
BUN: 11 mg/dL (ref 8–27)
CALCIUM: 10 mg/dL (ref 8.7–10.3)
CO2: 21 mmol/L (ref 20–29)
CREATININE: 0.97 mg/dL (ref 0.57–1.00)
Chloride: 100 mmol/L (ref 96–106)
GFR calc Af Amer: 71 mL/min/{1.73_m2} (ref >59)
GFR calc non Af Amer: 62 mL/min/{1.73_m2} (ref >59)
Glucose: 164 mg/dL — ABNORMAL HIGH (ref 65–99)
Phosphorus: 3.4 mg/dL (ref 3.0–4.3)
Potassium: 3.9 mmol/L (ref 3.5–5.2)
Sodium: 139 mmol/L (ref 134–144)

## 2018-05-22 MED ORDER — NITROFURANTOIN MONOHYD MACRO 100 MG PO CAPS: 100.0000 mg | ORAL_CAPSULE | Freq: Two times a day (BID) | ORAL | 0 refills | Status: DC

## 2018-05-22 NOTE — Telephone Encounter (Signed)
Patient returned call and was advised.  Patient wants to know if you could send in something for a possible uti, he is still having the same symptoms as before. Patient states that she stopped taking her turmeric. Patient also said her kidney and left side is hurting, she has applied heating pads to the areas. She states that she use a short catheter and nothing came out then she used a long catheter and about 500 cc came out.

## 2018-05-22 NOTE — Telephone Encounter (Signed)
Sent Macrobid to [facility]

## 2018-05-22 NOTE — Telephone Encounter (Signed)
LVMTRC 

## 2018-05-22 NOTE — Telephone Encounter (Signed)
-----   Message from Mar Daring, [place] sent at 05/22/2018  1:26 PM EST ----- Kidney function back to normal range. Sugar is up though so be careful with sugars and carbs in diet.

## 2018-05-22 NOTE — Telephone Encounter (Signed)
Left detailed message on pt's vm

## 2018-06-13 ENCOUNTER — Other Ambulatory Visit: Payer: Self-pay | Admitting: Physician Assistant

## 2018-06-13 DIAGNOSIS — E7800 Pure hypercholesterolemia, unspecified: Secondary | ICD-10-CM

## 2018-06-13 NOTE — Telephone Encounter (Signed)
Please review Lipid panel last ordered 04/19/18.KW

## 2018-06-13 NOTE — Telephone Encounter (Signed)
[facility] faxed refill request for the following medications:  atorvastatin (LIPITOR) 40 MG tablet   90 day supply  Last Rx was sent by Dr. Posey Pronto. Please advise. Thanks TNP

## 2018-06-14 MED ORDER — ATORVASTATIN CALCIUM 40 MG PO TABS: ORAL_TABLET | ORAL | 1 refills | Status: DC

## 2018-06-16 ENCOUNTER — Other Ambulatory Visit: Payer: Self-pay | Admitting: Physician Assistant

## 2018-06-16 DIAGNOSIS — K2100 Gastro-esophageal reflux disease with esophagitis, without bleeding: Secondary | ICD-10-CM

## 2018-06-18 DIAGNOSIS — H4312 Vitreous hemorrhage, left eye: Secondary | ICD-10-CM | POA: Diagnosis not present

## 2018-06-27 DIAGNOSIS — H4312 Vitreous hemorrhage, left eye: Secondary | ICD-10-CM | POA: Diagnosis not present

## 2018-06-28 DIAGNOSIS — R339 Retention of urine, unspecified: Secondary | ICD-10-CM | POA: Diagnosis not present

## 2018-09-01 ENCOUNTER — Other Ambulatory Visit: Payer: Self-pay | Admitting: Physician Assistant

## 2018-09-01 DIAGNOSIS — L4050 Arthropathic psoriasis, unspecified: Secondary | ICD-10-CM

## 2018-09-01 DIAGNOSIS — M069 Rheumatoid arthritis, unspecified: Secondary | ICD-10-CM

## 2018-09-24 DIAGNOSIS — R339 Retention of urine, unspecified: Secondary | ICD-10-CM | POA: Diagnosis not present

## 2018-10-07 DIAGNOSIS — C541 Malignant neoplasm of endometrium: Secondary | ICD-10-CM | POA: Diagnosis not present

## 2018-11-06 ENCOUNTER — Other Ambulatory Visit: Payer: Self-pay | Admitting: Physician Assistant

## 2018-11-06 DIAGNOSIS — I10 Essential (primary) hypertension: Secondary | ICD-10-CM

## 2018-11-06 DIAGNOSIS — N9989 Other postprocedural complications and disorders of genitourinary system: Secondary | ICD-10-CM

## 2018-11-14 ENCOUNTER — Telehealth: Payer: Self-pay | Admitting: Physician Assistant

## 2018-11-14 NOTE — Telephone Encounter (Signed)
Patient scheduled for tomorrow

## 2018-11-14 NOTE — Telephone Encounter (Signed)
Pt needing a call back regarding her ear.  Having pain ear pain. Wanting an in-office visit if possible.  Please call pt back at  [phone].  Thanks, [employer]

## 2018-11-14 NOTE — Telephone Encounter (Signed)
Can I schedule?

## 2018-11-14 NOTE — Telephone Encounter (Signed)
If she is not having any other symptoms she can. If she has anything else at all she has to be an evisit unfortunately.

## 2018-11-15 ENCOUNTER — Other Ambulatory Visit: Payer: Self-pay | Admitting: Physician Assistant

## 2018-11-15 ENCOUNTER — Ambulatory Visit (INDEPENDENT_AMBULATORY_CARE_PROVIDER_SITE_OTHER): Payer: Medicare HMO | Admitting: Physician Assistant

## 2018-11-15 ENCOUNTER — Encounter: Payer: Self-pay | Admitting: Physician Assistant

## 2018-11-15 ENCOUNTER — Other Ambulatory Visit: Payer: Self-pay

## 2018-11-15 VITALS — BP 122/88 | HR 94 | Temp 97.1°F | Resp 16 | Ht 67.0 in | Wt 242.0 lb

## 2018-11-15 DIAGNOSIS — N390 Urinary tract infection, site not specified: Secondary | ICD-10-CM

## 2018-11-15 DIAGNOSIS — E7800 Pure hypercholesterolemia, unspecified: Secondary | ICD-10-CM

## 2018-11-15 DIAGNOSIS — H60392 Other infective otitis externa, left ear: Secondary | ICD-10-CM

## 2018-11-15 DIAGNOSIS — K2100 Gastro-esophageal reflux disease with esophagitis, without bleeding: Secondary | ICD-10-CM

## 2018-11-15 LAB — POCT URINALYSIS DIPSTICK
Appearance: ABNORMAL
Bilirubin, UA: NEGATIVE
Blood, UA: NEGATIVE
Glucose, UA: NEGATIVE
Ketones, UA: NEGATIVE
Nitrite, UA: POSITIVE
Odor: ABNORMAL
Protein, UA: NEGATIVE
Spec Grav, UA: 1.01 (ref 1.010–1.025)
Urobilinogen, UA: 0.2 E.U./dL
pH, UA: 6 (ref 5.0–8.0)

## 2018-11-15 MED ORDER — CIPRODEX 0.3-0.1 % OT SUSP: 4.0000 [drp] | Freq: Two times a day (BID) | OTIC | 0 refills | Status: DC

## 2018-11-15 MED ORDER — NITROFURANTOIN MONOHYD MACRO 100 MG PO CAPS: 100.0000 mg | ORAL_CAPSULE | Freq: Two times a day (BID) | ORAL | 0 refills | Status: DC

## 2018-11-15 NOTE — Patient Instructions (Signed)
Otitis Externa  Otitis externa is an infection of the outer ear canal. The outer ear canal is the area between the outside of the ear and the eardrum. Otitis externa is sometimes called swimmer's ear. What are the causes? Common causes of this condition include:  Swimming in dirty water.  Moisture in the ear.  An injury to the inside of the ear.  An object stuck in the ear.  A cut or scrape on the outside of the ear. What increases the risk? You are more likely to develop this condition if you go swimming often. What are the signs or symptoms? The first symptom of this condition is often itching in the ear. Later symptoms of the condition include:  Swelling of the ear.  Redness in the ear.  Ear pain. The pain may get worse when you pull on your ear.  Pus coming from the ear. How is this diagnosed? This condition may be diagnosed by examining the ear and testing fluid from the ear for bacteria and funguses. How is this treated? This condition may be treated with:  Antibiotic ear drops. These are often given for 10-14 days.  Medicines to reduce itching and swelling. Follow these instructions at home:  If you were prescribed antibiotic ear drops, use them as told by your health care provider. Do not stop using the antibiotic even if your condition improves.  Take over-the-counter and prescription medicines only as told by your health care provider.  Avoid getting water in your ears as told by your health care provider. This may include avoiding swimming or water sports for a few days.  Keep all follow-up visits as told by your health care provider. This is important. How is this prevented?  Keep your ears dry. Use the corner of a towel to dry your ears after you swim or bathe.  Avoid scratching or putting things in your ear. Doing these things can damage the ear canal or remove the protective wax that lines it, which makes it easier for bacteria and funguses to grow.   Avoid swimming in lakes, polluted water, or pools that may not have enough chlorine. Contact a health care provider if:  You have a fever.  Your ear is still red, swollen, painful, or draining pus after 3 days.  Your redness, swelling, or pain gets worse.  You have a severe headache.  You have redness, swelling, pain, or tenderness in the area behind your ear. Summary  Otitis externa is an infection of the outer ear canal.  Common causes include swimming in dirty water, moisture in the ear, or a cut or scrape in the ear.  Symptoms include pain, redness, and swelling of the ear.  If you were prescribed antibiotic ear drops, use them as told by your health care provider. Do not stop using the antibiotic even if your condition improves. This information is not intended to replace advice given to you by your health care provider. Make sure you discuss any questions you have with your health care provider. Document Released: 03/13/2005 Document Revised: 08/17/2017 Document Reviewed: 08/17/2017 Elsevier Patient Education  2020 Elsevier Inc.  

## 2018-11-15 NOTE — Progress Notes (Signed)
Patient: Jean Gonzales Female    DOB: May 19, 1955   63 y.o.   MRN: [id number] Visit Date: 11/15/2018  Today's Provider: Mar Daring, PA-C   Chief Complaint  Patient presents with  . Ear Pain  . Urinary Tract Infection   Subjective:     Otalgia  There is pain in the left ear. This is a new problem. Associated symptoms include hearing loss. Pertinent negatives include no abdominal pain, coughing, diarrhea, ear discharge, headaches, neck pain, rash, rhinorrhea, sore throat or vomiting.      Hypertension, follow-up:  BP Readings from Last 3 Encounters:  11/15/18 122/88  04/19/18 124/80  02/25/18 121/75    She was last seen for hypertension 7 months ago.  BP at that visit was 124/80. Management since that visit includes; no changes.She reports good compliance with treatment. She is not having side effects. none She is not exercising. She is not adherent to low salt diet.   Outside blood pressures are normal. She is experiencing none.  Patient denies all.   Cardiovascular risk factors include diabetes mellitus.  Use of agents associated with hypertension: none.   -------------------------------------------------------------  Patient states her urine has started smelling and the color has changed. She has to self cath and is high risk for recurrent infections.   Also patient states her left ear has been hurting for about 1 week. Ear is stopped up and very sore. Patient has been using medications for ear wax and ear pain with no relief.     Allergies  Allergen Reactions  . Iodinated Diagnostic Agents Anaphylaxis  . Bactrim [Sulfamethoxazole-Trimethoprim] Itching and Rash     Current Outpatient Medications:  .  acetaminophen (TYLENOL) 325 MG tablet, Take 1-2 tablets (325-650 mg total) by mouth every 6 (six) hours as needed for mild pain (pain score 1-3 or temp > 100.5)., Disp: 30 tablet, Rfl: 0 .  aspirin EC 81 MG tablet, Take 81 mg by mouth daily.,  Disp: , Rfl:  .  atorvastatin (LIPITOR) 40 MG tablet, TAKE 1 TABLET (40 MG) BY MOUTH EVERY DAY IN THE MORNING., Disp: 90 tablet, Rfl: 1 .  celecoxib (CELEBREX) 100 MG capsule, TAKE 1 CAPSULE BY MOUTH TWICE A DAY, Disp: 180 capsule, Rfl: 1 .  diclofenac sodium (VOLTAREN) 1 % GEL, Apply 2 g topically 4 (four) times daily. Apply to knee, Disp: 400 g, Rfl: 3 .  esomeprazole (NEXIUM) 40 MG capsule, TAKE 1 CAPSULE BY MOUTH EVERY DAY, Disp: 90 capsule, Rfl: 1 .  hydrochlorothiazide (HYDRODIURIL) 25 MG tablet, TAKE 1 TABLET BY MOUTH EVERY DAY, Disp: 90 tablet, Rfl: 3 .  nystatin (MYCOSTATIN/NYSTOP) powder, Apply topically 3 (three) times daily., Disp: 100 g, Rfl: 5 .  tamsulosin (FLOMAX) 0.4 MG CAPS capsule, TAKE 1 CAPSULE BY MOUTH EVERY DAY, Disp: 90 capsule, Rfl: 3 .  Turmeric, Curcuma Longa, (CURCUMIN) POWD, by Does not apply route. With ginger and black seed, Disp: , Rfl:  .  zolpidem (AMBIEN) 10 MG tablet, TAKE 1 TABLET (10 MG TOTAL) BY MOUTH AT BEDTIME AS NEEDED FOR SLEEP., Disp: 90 tablet, Rfl: 1 .  furosemide (LASIX) 20 MG tablet, TAKE 1 TABLET BY MOUTH TWICE A DAY (Patient not taking: Reported on 04/19/2018), Disp: 180 tablet, Rfl: 1 .  gabapentin (NEURONTIN) 300 MG capsule, Take 1 capsule (300 mg total) by mouth 3 (three) times daily. (Patient not taking: Reported on 02/14/2018), Disp: 60 capsule, Rfl: 0 .  nitrofurantoin, macrocrystal-monohydrate, (MACROBID) 100 MG capsule, Take  1 capsule (100 mg total) by mouth 2 (two) times daily. (Patient not taking: Reported on 11/15/2018), Disp: 20 capsule, Rfl: 0 .  potassium chloride (K-DUR) 10 MEQ tablet, TAKE 1 TABLET BY MOUTH 2 TIMES DAILY. (Patient not taking: Reported on 04/19/2018), Disp: 180 tablet, Rfl: 1 .  triamcinolone cream (KENALOG) 0.1 %, Apply 1 application topically 2 (two) times daily. (Patient not taking: Reported on 11/15/2018), Disp: 80 g, Rfl: 3  Review of Systems  Constitutional: Negative for appetite change, chills, fatigue and fever.   HENT: Positive for ear pain and hearing loss. Negative for ear discharge, rhinorrhea and sore throat.   Respiratory: Negative for cough, chest tightness and shortness of breath.   Cardiovascular: Negative for chest pain and palpitations.  Gastrointestinal: Negative for abdominal pain, diarrhea, nausea and vomiting.  Musculoskeletal: Negative for neck pain.  Skin: Negative for rash.  Neurological: Negative for dizziness, weakness and headaches.    Social History   Tobacco Use  . Smoking status: Never Smoker  . Smokeless tobacco: Never Used  Substance Use Topics  . Alcohol use: No    Alcohol/week: 0.0 standard drinks      Objective:   BP 122/88 (BP Location: Right Arm, Patient Position: Sitting, Cuff Size: Large)   Pulse 94   Temp (!) 97.1 F (36.2 C) (Oral)   Resp 16   Ht 5\' 7"  (1.702 m)   Wt 242 lb (109.8 kg)   SpO2 95%   BMI 37.90 kg/m  Vitals:   11/15/18 1357  BP: 122/88  Pulse: 94  Resp: 16  Temp: (!) 97.1 F (36.2 C)  TempSrc: Oral  SpO2: 95%  Weight: 242 lb (109.8 kg)  Height: 5\' 7"  (1.702 m)     Physical Exam Vitals signs reviewed.  Constitutional:      General: She is not in acute distress.    Appearance: Normal appearance. She is well-developed. She is not ill-appearing or diaphoretic.  HENT:     Head: Normocephalic and atraumatic.     Right Ear: Hearing, tympanic membrane, ear canal and external ear normal.     Left Ear: Hearing, tympanic membrane, ear canal and external ear normal. Swelling present. There is no impacted cerumen.     Ears:     Comments: Left external canal is swollen shut, unable to see past opening.    Nose: Nose normal. No congestion.     Mouth/Throat:     Mouth: Mucous membranes are moist.     Pharynx: Uvula midline. No oropharyngeal exudate.  Eyes:     General: No scleral icterus.       Right eye: No discharge.        Left eye: No discharge.     Conjunctiva/sclera: Conjunctivae normal.     Pupils: Pupils are equal, round,  and reactive to light.  Neck:     Musculoskeletal: Normal range of motion and neck supple.     Thyroid: No thyromegaly.     Trachea: No tracheal deviation.  Cardiovascular:     Rate and Rhythm: Normal rate and regular rhythm.     Heart sounds: Normal heart sounds. No murmur. No friction rub. No gallop.   Pulmonary:     Effort: Pulmonary effort is normal. No respiratory distress.     Breath sounds: Normal breath sounds. No stridor. No wheezing or rales.  Abdominal:     General: Abdomen is flat. Bowel sounds are normal.     Palpations: Abdomen is soft.     Tenderness:  There is no abdominal tenderness. There is no right CVA tenderness or left CVA tenderness.  Lymphadenopathy:     Cervical: No cervical adenopathy.  Skin:    General: Skin is warm and dry.  Neurological:     Mental Status: She is alert.      Results for orders placed or performed in visit on 11/15/18  POCT urinalysis dipstick  Result Value Ref Range   Color, UA Yellow    Clarity, UA Cloudy    Glucose, UA Negative Negative   Bilirubin, UA Neg    Ketones, UA Neg    Spec Grav, UA 1.010 1.010 - 1.025   Blood, UA Neg    pH, UA 6.0 5.0 - 8.0   Protein, UA Negative Negative   Urobilinogen, UA 0.2 0.2 or 1.0 E.U./dL   Nitrite, UA Positive    Leukocytes, UA Large (3+) (A) Negative   Appearance Abnormal    Odor Abnormal        Assessment & Plan    1. Recurrent UTI Worsening symptoms. UA positive. Will treat empirically with Macrobid as below.  Continue to push fluids. Urine sent for culture. Will follow up pending C&S results. She is to call if symptoms do not improve or if they worsen.  - POCT urinalysis dipstick - CULTURE, URINE COMPREHENSIVE - nitrofurantoin, macrocrystal-monohydrate, (MACROBID) 100 MG capsule; Take 1 capsule (100 mg total) by mouth every 12 (twelve) hours.  Dispense: 20 capsule; Refill: 0  2. Other infective acute otitis externa of left ear Worsening. Will use Ciprodex as below for  antibiotic in case bacterial otitis externa as well as to get the anti-inflammatory properties of dexamethasone. Call if worsening or not improving. - ciprofloxacin-dexamethasone (CIPRODEX) OTIC suspension; Place 4 drops into the left ear 2 (two) times daily. X 5-7 days  Dispense: 7.5 mL; Refill: 0     Mar Daring, PA-C  [facility]

## 2018-11-17 LAB — CULTURE, URINE COMPREHENSIVE

## 2018-11-18 MED ORDER — CEPHALEXIN 500 MG PO CAPS: 500.0000 mg | ORAL_CAPSULE | Freq: Two times a day (BID) | ORAL | 0 refills | Status: DC

## 2018-11-27 ENCOUNTER — Encounter: Payer: Self-pay | Admitting: Physician Assistant

## 2018-11-29 ENCOUNTER — Ambulatory Visit: Payer: Medicare HMO | Admitting: Urology

## 2018-12-03 NOTE — Progress Notes (Deleted)
12/04/2018 10:42 AM   Jean Gonzales 10-20-1955 [id number]  Referring provider: Mar Daring, PA-C [place],   [id number]  No chief complaint on file.   HPI: Patient is a 63 year old female with a history of urinary retention and history of hydronephrosis who presents today for follow-up.  History of urinary retention She had a total knee replacement in April 2019 and developed right flank pain and a CT showed right hydronephrosis.  Her bladder was noted to be distended and a Foley catheter was placed.  Her hydronephrosis resolved with bladder drainage and this had previously occurred in 2018.  RUS in 08/2017 noted no hydronephrosis.       PMH: Past Medical History:  Diagnosis Date  . Ankylosing spondylitis ([place])   . Arthritis    osteoarthritis right knee  . Cancer [facility]) 2014   endometrial CA  . Diabetes mellitus without complication (H[facility])    diet controlled  . Endometrial cancer ([place])   . GERD (gastroesophageal reflux disease)   . Hyperlipidemia   . Hypertension    improved after ca tx    Surgical History: Past Surgical History:  Procedure Laterality Date  . ABDOMINAL HYSTERECTOMY  2015   cancer  . BREAST BIOPSY Right 2008ish   core  . CATARACT EXTRACTION W/PHACO Right 02/05/2018   Procedure: CATARACT EXTRACTION PHACO AND INTRAOCULAR LENS PLACEMENT ([place]) RIGHT DIABETES IVA TOPICAL;  Surgeon: Eulogio Bear, MD;  Location: [facility];  Service: Ophthalmology;  Laterality: Right;  . CATARACT EXTRACTION W/PHACO Left 02/25/2018   Procedure: CATARACT EXTRACTION PHACO AND INTRAOCULAR LENS PLACEMENT ([place]) LEFT DIABETIC;  Surgeon: Eulogio Bear, MD;  Location: [place];  Service: Ophthalmology;  Laterality: Left;  . CHOLECYSTECTOMY  1992  . TONSILLECTOMY  1973  . TONSILLECTOMY    . TOTAL KNEE ARTHROPLASTY Right 07/09/2017   Procedure: TOTAL KNEE ARTHROPLASTY;  Surgeon: Lovell Sheehan, MD;  Location:  [facility];  Service: Orthopedics;  Laterality: Right;    Home Medications:  Allergies as of 12/04/2018      Reactions   Iodinated Diagnostic Agents Anaphylaxis   Bactrim [sulfamethoxazole-trimethoprim] Itching, Rash      Medication List       Accurate as of December 03, 2018 10:42 AM. If you have any questions, ask your nurse or doctor.        acetaminophen 325 MG tablet Commonly known as: TYLENOL Take 1-2 tablets (325-650 mg total) by mouth every 6 (six) hours as needed for mild pain (pain score 1-3 or temp > 100.5).   aspirin EC 81 MG tablet Take 81 mg by mouth daily.   atorvastatin 40 MG tablet Commonly known as: LIPITOR TAKE 1 TABLET (40 MG) BY MOUTH EVERY DAY IN THE MORNING.   celecoxib 100 MG capsule Commonly known as: CELEBREX TAKE 1 CAPSULE BY MOUTH TWICE A DAY   cephALEXin 500 MG capsule Commonly known as: KEFLEX Take 1 capsule (500 mg total) by mouth 2 (two) times daily.   Ciprodex OTIC suspension Generic drug: ciprofloxacin-dexamethasone Place 4 drops into the left ear 2 (two) times daily. X 5-7 days   Curcumin Powd by Does not apply route. With ginger and black seed   diclofenac sodium 1 % Gel Commonly known as: VOLTAREN Apply 2 g topically 4 (four) times daily. Apply to knee   esomeprazole 40 MG capsule Commonly known as: NEXIUM TAKE 1 CAPSULE BY MOUTH EVERY DAY   furosemide 20 MG tablet Commonly  known as: LASIX TAKE 1 TABLET BY MOUTH TWICE A DAY   gabapentin 300 MG capsule Commonly known as: NEURONTIN Take 1 capsule (300 mg total) by mouth 3 (three) times daily.   hydrochlorothiazide 25 MG tablet Commonly known as: HYDRODIURIL TAKE 1 TABLET BY MOUTH EVERY DAY   nitrofurantoin (macrocrystal-monohydrate) 100 MG capsule Commonly known as: Macrobid Take 1 capsule (100 mg total) by mouth every 12 (twelve) hours.   nystatin powder Commonly known as: MYCOSTATIN/NYSTOP Apply topically 3 (three) times daily.   potassium chloride 10 MEQ tablet  Commonly known as: K-DUR TAKE 1 TABLET BY MOUTH 2 TIMES DAILY.   tamsulosin 0.4 MG Caps capsule Commonly known as: FLOMAX TAKE 1 CAPSULE BY MOUTH EVERY DAY   triamcinolone cream 0.1 % Commonly known as: KENALOG Apply 1 application topically 2 (two) times daily.   zolpidem 10 MG tablet Commonly known as: AMBIEN TAKE 1 TABLET (10 MG TOTAL) BY MOUTH AT BEDTIME AS NEEDED FOR SLEEP.       Allergies:  Allergies  Allergen Reactions  . Iodinated Diagnostic Agents Anaphylaxis  . Bactrim [Sulfamethoxazole-Trimethoprim] Itching and Rash    Family History: Family History  Problem Relation Age of Onset  . Diabetes Mother   . Cancer Mother   . Heart disease Father   . Breast cancer Neg Hx   . Kidney cancer Neg Hx   . Bladder Cancer Neg Hx     Social History:  reports that she has never smoked. She has never used smokeless tobacco. She reports that she does not drink alcohol or use drugs.  ROS:                                        Physical Exam: There were no vitals taken for this visit.  Constitutional:  Well nourished. Alert and oriented, No acute distress. HEENT: [place] AT, moist mucus membranes.  Trachea midline, no masses. Cardiovascular: No clubbing, cyanosis, or edema. Respiratory: Normal respiratory effort, no increased work of breathing. GI: Abdomen is soft, non tender, non distended, no abdominal masses. Liver and spleen not palpable.  No hernias appreciated.  Stool sample for occult testing is not indicated.   GU: No CVA tenderness.  No bladder fullness or masses.  *** external genitalia, *** pubic hair distribution, no lesions.  Normal urethral meatus, no lesions, no prolapse, no discharge.   No urethral masses, tenderness and/or tenderness. No bladder fullness, tenderness or masses. *** vagina mucosa, *** estrogen effect, no discharge, no lesions, *** pelvic support, *** cystocele and *** rectocele noted.  No cervical motion tenderness.  Uterus is  freely mobile and non-fixed.  No adnexal/parametria masses or tenderness noted.  Anus and perineum are without rashes or lesions.   ***  Skin: No rashes, bruises or suspicious lesions. Lymph: No cervical or inguinal adenopathy. Neurologic: Grossly intact, no focal deficits, moving all 4 extremities. Psychiatric: Normal mood and affect.   Laboratory Data: N/A  Assessment & Plan:    1. Incomplete bladder emptying Patient is doing well with CIC Will get a prescription for straight catheters RTC in one year for recheck   2. History of hydronephrosis Resolved with bladder decompression   [name], PA-C  [place] [address], [place], [place] [address] [phone]

## 2018-12-04 ENCOUNTER — Ambulatory Visit: Payer: Medicare HMO | Admitting: Urology

## 2018-12-13 ENCOUNTER — Other Ambulatory Visit: Payer: Self-pay | Admitting: Physician Assistant

## 2018-12-25 DIAGNOSIS — R339 Retention of urine, unspecified: Secondary | ICD-10-CM | POA: Diagnosis not present

## 2019-01-07 LAB — FECAL OCCULT BLOOD, IMMUNOCHEMICAL: IFOBT: NEGATIVE

## 2019-01-18 ENCOUNTER — Other Ambulatory Visit: Payer: Self-pay | Admitting: Physician Assistant

## 2019-01-18 DIAGNOSIS — L4050 Arthropathic psoriasis, unspecified: Secondary | ICD-10-CM

## 2019-01-18 DIAGNOSIS — F5101 Primary insomnia: Secondary | ICD-10-CM

## 2019-01-18 DIAGNOSIS — M069 Rheumatoid arthritis, unspecified: Secondary | ICD-10-CM

## 2019-02-03 ENCOUNTER — Encounter: Payer: Self-pay | Admitting: Physician Assistant

## 2019-02-18 NOTE — Progress Notes (Signed)
Patient: Jean Gonzales Female    DOB: 1955-12-18   63 y.o.   MRN: [id number] Visit Date: 02/19/2019  Today's Provider: Mar Daring, PA-C   Chief Complaint  Patient presents with  . Dysuria   Subjective:    I,Jean Gonzales,RMA am acting as a Education administrator for [employer], PA-C.   Virtual Visit via Telephone Note  I connected with Jean Gonzales on 02/19/19 at  1:40 PM EST by telephone and verified that I am speaking with the correct person using two identifiers.  Location: Patient: home Provider: [facility]   I discussed the limitations, risks, security and privacy concerns of performing an evaluation and management service by telephone and the availability of in person appointments. I also discussed with the patient that there may be a patient responsible charge related to this service. The patient expressed understanding and agreed to proceed.   Dysuria  This is a new problem. The current episode started 1 to 4 weeks ago (For the past 3 weeks). Episode frequency: She cath her self at leat twice a day. The problem has been unchanged. The patient is experiencing no pain. There has been no fever. Associated symptoms include frequency. Pertinent negatives include no flank pain, nausea or vomiting. Associated symptoms comments: Odor and cloudy urine.  Patient's history is complicated as she has urinary retention secondary to scar tissue from RXT for endometrial cancer. She has to self cath twice daily for this.   Allergies  Allergen Reactions  . Iodinated Diagnostic Agents Anaphylaxis  . Bactrim [Sulfamethoxazole-Trimethoprim] Itching and Rash     Current Outpatient Medications:  .  acetaminophen (TYLENOL) 325 MG tablet, Take 1-2 tablets (325-650 mg total) by mouth every 6 (six) hours as needed for mild pain (pain score 1-3 or temp > 100.5)., Disp: 30 tablet, Rfl: 0 .  aspirin EC 81 MG tablet, Take 81 mg by mouth daily., Disp: , Rfl:  .  atorvastatin  (LIPITOR) 40 MG tablet, TAKE 1 TABLET (40 MG) BY MOUTH EVERY DAY IN THE MORNING., Disp: 90 tablet, Rfl: 1 .  celecoxib (CELEBREX) 100 MG capsule, TAKE 1 CAPSULE BY MOUTH TWICE A DAY, Disp: 180 capsule, Rfl: 1 .  diclofenac sodium (VOLTAREN) 1 % GEL, Apply 2 g topically 4 (four) times daily. Apply to knee, Disp: 400 g, Rfl: 3 .  esomeprazole (NEXIUM) 40 MG capsule, TAKE 1 CAPSULE BY MOUTH EVERY DAY, Disp: 90 capsule, Rfl: 1 .  gabapentin (NEURONTIN) 300 MG capsule, TAKE 1 CAPSULE BY MOUTH THREE TIMES A DAY, Disp: 270 capsule, Rfl: 1 .  hydrochlorothiazide (HYDRODIURIL) 25 MG tablet, TAKE 1 TABLET BY MOUTH EVERY DAY, Disp: 90 tablet, Rfl: 3 .  nystatin (MYCOSTATIN/NYSTOP) powder, Apply topically 3 (three) times daily., Disp: 100 g, Rfl: 5 .  tamsulosin (FLOMAX) 0.4 MG CAPS capsule, TAKE 1 CAPSULE BY MOUTH EVERY DAY, Disp: 90 capsule, Rfl: 3 .  Turmeric, Curcuma Longa, (CURCUMIN) POWD, by Does not apply route. With ginger and black seed, Disp: , Rfl:  .  zolpidem (AMBIEN) 10 MG tablet, TAKE 1 TABLET BY MOUTH AT BEDTIME AS NEEDED FOR SLEEP., Disp: 90 tablet, Rfl: 1 .  furosemide (LASIX) 20 MG tablet, TAKE 1 TABLET BY MOUTH TWICE A DAY (Patient not taking: Reported on 02/19/2019), Disp: 180 tablet, Rfl: 1 .  potassium chloride (K-DUR) 10 MEQ tablet, TAKE 1 TABLET BY MOUTH 2 TIMES DAILY. (Patient not taking: Reported on 04/19/2018), Disp: 180 tablet, Rfl: 1 .  triamcinolone  ointment (KENALOG) 0.1 %, Apply 1 application topically 2 (two) times daily., Disp: 30 g, Rfl: 0  Review of Systems  Constitutional: Negative.   Respiratory: Negative.   Cardiovascular: Negative.   Gastrointestinal: Positive for abdominal distention (after eating). Negative for abdominal pain, constipation, nausea and vomiting.  Genitourinary: Positive for difficulty urinating (chronic urinary retention from RXT for endometrial cancer), dysuria and frequency. Negative for flank pain.  Musculoskeletal: Negative.   Neurological:  Negative.     Social History   Tobacco Use  . Smoking status: Never Smoker  . Smokeless tobacco: Never Used  Substance Use Topics  . Alcohol use: No    Alcohol/week: 0.0 standard drinks      Objective:   There were no vitals taken for this visit. There were no vitals filed for this visit.There is no height or weight on file to calculate BMI.   Physical Exam Vitals signs reviewed.  Constitutional:      General: She is not in acute distress. Pulmonary:     Effort: Pulmonary effort is normal. No respiratory distress.  Neurological:     Mental Status: She is alert.      Results for orders placed or performed in visit on 02/19/19  POCT urinalysis dipstick  Result Value Ref Range   Color, UA Light Yellow    Clarity, UA clear    Glucose, UA Negative Negative   Bilirubin, UA Negative    Ketones, UA Negative    Spec Grav, UA 1.015 1.010 - 1.025   Blood, UA Negative    pH, UA 6.0 5.0 - 8.0   Protein, UA Negative Negative   Urobilinogen, UA 0.2 0.2 or 1.0 E.U./dL   Nitrite, UA Positive    Leukocytes, UA Large (3+) (A) Negative   Appearance     Odor Positive        Assessment & Plan     1. Recurrent UTI UA positive today. Will send for culture. Last culture on 11/15/18 was positive for Klebsiella. She was treated with keflex eventually (started on macrobid but changed due to resistance). Will treat with Keflex as below. Will f/u pending C&S results. Call if worsening.  - Urine Culture - cephALEXin (KEFLEX) 500 MG capsule; Take 1 capsule (500 mg total) by mouth 2 (two) times daily.  Dispense: 20 capsule; Refill: 0  2. Psoriatic arthritis ([place]) Stable currently. Uses Triamcinolone ointment for psoriatic flares on her scalp.   4. Depression, major, single episode, moderate ([place]) Patient reports she is doing well and feels the best she has felt in a long time.   5. Malignant neoplasm of endometrium [facility]) Diagnosed in January 2015 and completed treatment by summer of  2015. Successful 5 year remission. Now on annual f/u with GYN Oncology. Followed by Dr. Alycia Rossetti, [facility].  6. Gastroesophageal reflux disease with esophagitis Needs Mylan manufacturer specifically. [facility] is blue capsule now and causes her itching. When she does not take she does not itch. She is requesting Mylan manufacturer for generic esomeprazole as this is only generic she has tolerated. This was sent as below.  - esomeprazole (NEXIUM) 40 MG capsule; TAKE 1 CAPSULE BY MOUTH EVERY DAY; mylan brand only please  Dispense: 90 capsule; Refill: 1   I discussed the assessment and treatment plan with the patient. The patient was provided an opportunity to ask questions and all were answered. The patient agreed with the plan and demonstrated an understanding of the instructions.   The patient was  advised to call back or seek an in-person evaluation if the symptoms worsen or if the condition fails to improve as anticipated.  I provided 44 minutes of non-face-to-face time during this encounter.  I spent approximately 44 minutes with the patient today. Over 50% of this time was spent with counseling and educating the patient.    Mar Daring, PA-C  [facility]

## 2019-02-19 ENCOUNTER — Ambulatory Visit (INDEPENDENT_AMBULATORY_CARE_PROVIDER_SITE_OTHER): Payer: Medicare HMO | Admitting: Physician Assistant

## 2019-02-19 ENCOUNTER — Encounter: Payer: Self-pay | Admitting: Physician Assistant

## 2019-02-19 DIAGNOSIS — R69 Illness, unspecified: Secondary | ICD-10-CM | POA: Diagnosis not present

## 2019-02-19 DIAGNOSIS — L4050 Arthropathic psoriasis, unspecified: Secondary | ICD-10-CM | POA: Diagnosis not present

## 2019-02-19 DIAGNOSIS — N390 Urinary tract infection, site not specified: Secondary | ICD-10-CM

## 2019-02-19 DIAGNOSIS — K2100 Gastro-esophageal reflux disease with esophagitis, without bleeding: Secondary | ICD-10-CM | POA: Diagnosis not present

## 2019-02-19 DIAGNOSIS — C541 Malignant neoplasm of endometrium: Secondary | ICD-10-CM | POA: Diagnosis not present

## 2019-02-19 DIAGNOSIS — F321 Major depressive disorder, single episode, moderate: Secondary | ICD-10-CM | POA: Diagnosis not present

## 2019-02-19 LAB — POCT URINALYSIS DIPSTICK
Bilirubin, UA: NEGATIVE
Blood, UA: NEGATIVE
Glucose, UA: NEGATIVE
Ketones, UA: NEGATIVE
Nitrite, UA: POSITIVE
Odor: POSITIVE
Protein, UA: NEGATIVE
Spec Grav, UA: 1.015 (ref 1.010–1.025)
Urobilinogen, UA: 0.2 E.U./dL
pH, UA: 6 (ref 5.0–8.0)

## 2019-02-19 MED ORDER — TRIAMCINOLONE ACETONIDE 0.1 % EX OINT: 1.0000 "application " | TOPICAL_OINTMENT | Freq: Two times a day (BID) | CUTANEOUS | 0 refills | Status: DC

## 2019-02-19 MED ORDER — CEPHALEXIN 500 MG PO CAPS: 500.0000 mg | ORAL_CAPSULE | Freq: Two times a day (BID) | ORAL | 0 refills | Status: DC

## 2019-02-19 MED ORDER — ESOMEPRAZOLE MAGNESIUM 40 MG PO CPDR: DELAYED_RELEASE_CAPSULE | ORAL | 1 refills | Status: DC

## 2019-02-19 NOTE — Patient Instructions (Signed)
Urinary Tract Infection, Adult A urinary tract infection (UTI) is an infection of any part of the urinary tract. The urinary tract includes:  The kidneys.  The ureters.  The bladder.  The urethra. These organs make, store, and get rid of pee (urine) in the body. What are the causes? This is caused by germs (bacteria) in your genital area. These germs grow and cause swelling (inflammation) of your urinary tract. What increases the risk? You are more likely to develop this condition if:  You have a small, thin tube (catheter) to drain pee.  You cannot control when you pee or poop (incontinence).  You are female, and: ? You use these methods to prevent pregnancy: ? A medicine that kills sperm (spermicide). ? A device that blocks sperm (diaphragm). ? You have low levels of a female hormone (estrogen). ? You are pregnant.  You have genes that add to your risk.  You are sexually active.  You take antibiotic medicines.  You have trouble peeing because of: ? A prostate that is bigger than normal, if you are female. ? A blockage in the part of your body that drains pee from the bladder (urethra). ? A kidney stone. ? A nerve condition that affects your bladder (neurogenic bladder). ? Not getting enough to drink. ? Not peeing often enough.  You have other conditions, such as: ? Diabetes. ? A weak disease-fighting system (immune system). ? Sickle cell disease. ? Gout. ? Injury of the spine. What are the signs or symptoms? Symptoms of this condition include:  Needing to pee right away (urgently).  Peeing often.  Peeing small amounts often.  Pain or burning when peeing.  Blood in the pee.  Pee that smells bad or not like normal.  Trouble peeing.  Pee that is cloudy.  Fluid coming from the vagina, if you are female.  Pain in the belly or lower back. Other symptoms include:  Throwing up (vomiting).  No urge to eat.  Feeling mixed up (confused).  Being tired  and grouchy (irritable).  A fever.  Watery poop (diarrhea). How is this treated? This condition may be treated with:  Antibiotic medicine.  Other medicines.  Drinking enough water. Follow these instructions at home:  Medicines  Take over-the-counter and prescription medicines only as told by your doctor.  If you were prescribed an antibiotic medicine, take it as told by your doctor. Do not stop taking it even if you start to feel better. General instructions  Make sure you: ? Pee until your bladder is empty. ? Do not hold pee for a long time. ? Empty your bladder after sex. ? Wipe from front to back after pooping if you are a female. Use each tissue one time when you wipe.  Drink enough fluid to keep your pee pale yellow.  Keep all follow-up visits as told by your doctor. This is important. Contact a doctor if:  You do not get better after 1-2 days.  Your symptoms go away and then come back. Get help right away if:  You have very bad back pain.  You have very bad pain in your lower belly.  You have a fever.  You are sick to your stomach (nauseous).  You are throwing up. Summary  A urinary tract infection (UTI) is an infection of any part of the urinary tract.  This condition is caused by germs in your genital area.  There are many risk factors for a UTI. These include having a small, thin   tube to drain pee and not being able to control when you pee or poop.  Treatment includes antibiotic medicines for germs.  Drink enough fluid to keep your pee pale yellow. This information is not intended to replace advice given to you by your health care provider. Make sure you discuss any questions you have with your health care provider. Document Released: 08/30/2007 Document Revised: 02/28/2018 Document Reviewed: 09/20/2017 Elsevier Patient Education  2020 Elsevier Inc.  

## 2019-02-23 LAB — URINE CULTURE

## 2019-02-24 ENCOUNTER — Telehealth: Payer: Self-pay

## 2019-02-24 DIAGNOSIS — N390 Urinary tract infection, site not specified: Secondary | ICD-10-CM

## 2019-02-24 NOTE — Telephone Encounter (Signed)
Patient advised as directed below. 

## 2019-02-24 NOTE — Telephone Encounter (Signed)
-----   Message from Mar Daring, [place] sent at 02/24/2019  8:43 AM EST ----- Urine grew out klebsiella again. Should be susceptible to keflex. Continue until completed and call if symptoms do not completely resolve or if they return.

## 2019-03-02 ENCOUNTER — Other Ambulatory Visit: Payer: Self-pay | Admitting: Physician Assistant

## 2019-03-02 DIAGNOSIS — K2100 Gastro-esophageal reflux disease with esophagitis, without bleeding: Secondary | ICD-10-CM

## 2019-03-03 NOTE — Telephone Encounter (Signed)
Requested medication (s) are due for refill today: yes  Requested medication (s) are on the active medication list: yes  Last refill:  03/02/2019  Future visit scheduled: no  Notes to clinic:  Alternative Requested:NEED DOCTOR TO CONTACT INSURANCE TO GET OVERRIDE FOR PATIENT. ITS TOO TO FILL B/C PREVIOUS FILL IS NOT WORKING FOR HER ACID REFLUX, SHE HAS REQUESTED A CERTAIN BRAND (MYLAN) OF ESOMEPRAZOL.   Requested Prescriptions  Pending Prescriptions Disp Refills   omeprazole (PRILOSEC) 40 MG capsule [Pharmacy Med Name: OMEPRAZOLE DR 40 MG CAPSULE]  0    Sig: Please specify directions, refills and quantity     Gastroenterology: Proton Pump Inhibitors Passed - 03/02/2019  5:31 PM      Passed - Valid encounter within last 12 months    Recent Outpatient Visits          1 week ago Recurrent UTI   [place], [place], [place]   3 months ago Recurrent UTI   [facility] [place]alling M, [place]   10 months ago Type 2 diabetes mellitus with diabetic polyneuropathy, without long-term current use of insulin [facility])   [place], Clearnce Sorrel, [place]   1 year ago Annual physical exam   [place], [place]   1 year ago Bilateral lower extremity edema   [facility], [place], [place]

## 2019-03-04 ENCOUNTER — Telehealth: Payer: Self-pay

## 2019-03-04 NOTE — Telephone Encounter (Signed)
[id number] [facility] and spoke with the pharmacist put in new RX for Esomeprazole mag Dr 40 mg cap with brand name Mylan as patient requested. Qty:90 R:1 Patient to expect medication within 5-6 business day from today.

## 2019-03-13 DIAGNOSIS — E1142 Type 2 diabetes mellitus with diabetic polyneuropathy: Secondary | ICD-10-CM | POA: Diagnosis not present

## 2019-03-13 DIAGNOSIS — K219 Gastro-esophageal reflux disease without esophagitis: Secondary | ICD-10-CM | POA: Diagnosis not present

## 2019-03-13 DIAGNOSIS — Z8542 Personal history of malignant neoplasm of other parts of uterus: Secondary | ICD-10-CM | POA: Diagnosis not present

## 2019-03-13 DIAGNOSIS — I10 Essential (primary) hypertension: Secondary | ICD-10-CM | POA: Diagnosis not present

## 2019-03-13 DIAGNOSIS — M1990 Unspecified osteoarthritis, unspecified site: Secondary | ICD-10-CM | POA: Diagnosis not present

## 2019-03-13 DIAGNOSIS — E669 Obesity, unspecified: Secondary | ICD-10-CM | POA: Diagnosis not present

## 2019-03-13 DIAGNOSIS — Z791 Long term (current) use of non-steroidal anti-inflammatories (NSAID): Secondary | ICD-10-CM | POA: Diagnosis not present

## 2019-03-13 DIAGNOSIS — G4700 Insomnia, unspecified: Secondary | ICD-10-CM | POA: Diagnosis not present

## 2019-03-13 DIAGNOSIS — G8929 Other chronic pain: Secondary | ICD-10-CM | POA: Diagnosis not present

## 2019-03-13 DIAGNOSIS — R32 Unspecified urinary incontinence: Secondary | ICD-10-CM | POA: Diagnosis not present

## 2019-03-13 MED ORDER — CEPHALEXIN 500 MG PO CAPS: 500.0000 mg | ORAL_CAPSULE | Freq: Two times a day (BID) | ORAL | 0 refills | Status: DC

## 2019-03-13 MED ORDER — FLUCONAZOLE 150 MG PO TABS: 150.0000 mg | ORAL_TABLET | Freq: Once | ORAL | 0 refills | Status: AC

## 2019-03-13 NOTE — Addendum Note (Signed)
Addended by: Mar Daring on: 03/13/2019 02:22 PM   Modules accepted: Orders

## 2019-03-13 NOTE — Telephone Encounter (Signed)
Patient requesting call from Jean Gonzales to discuss below message. She states her urine is cloudy again. Please advise.

## 2019-03-13 NOTE — Telephone Encounter (Signed)
Diflucan sent in

## 2019-03-13 NOTE — Telephone Encounter (Signed)
Patient is calling that her urine is cloudy again.It did cleared up for a week but back to cloudy/orange and now she is having burning.

## 2019-03-13 NOTE — Addendum Note (Signed)
Addended by: Mar Daring on: 03/13/2019 04:50 PM   Modules accepted: Orders

## 2019-03-13 NOTE — Telephone Encounter (Signed)
Patient advised as directed below. She is asking for Diflucan.

## 2019-03-13 NOTE — Telephone Encounter (Signed)
Sent in longer course of Keflex. Call if it returns.

## 2019-03-15 ENCOUNTER — Encounter: Payer: Self-pay | Admitting: Physician Assistant

## 2019-03-31 ENCOUNTER — Encounter: Payer: Self-pay | Admitting: Physician Assistant

## 2019-03-31 DIAGNOSIS — B379 Candidiasis, unspecified: Secondary | ICD-10-CM

## 2019-03-31 DIAGNOSIS — N390 Urinary tract infection, site not specified: Secondary | ICD-10-CM

## 2019-03-31 DIAGNOSIS — T3695XA Adverse effect of unspecified systemic antibiotic, initial encounter: Secondary | ICD-10-CM

## 2019-03-31 MED ORDER — TERCONAZOLE 0.4 % VA CREA: 1.0000 | TOPICAL_CREAM | Freq: Every day | VAGINAL | 5 refills | Status: DC

## 2019-03-31 MED ORDER — CIPROFLOXACIN HCL 500 MG PO TABS: 500.0000 mg | ORAL_TABLET | Freq: Two times a day (BID) | ORAL | 0 refills | Status: DC

## 2019-04-14 IMAGING — DX DG KNEE 1-2V PORT*R*
2 series · 2 of 2 positions shown · non-contrast
Comparison: Right knee series 07/24/2016.

CLINICAL DATA: 61-year-old female status post right total knee
replacement.

EXAM:
PORTABLE RIGHT KNEE - 1-2 VIEW

[knee ap]
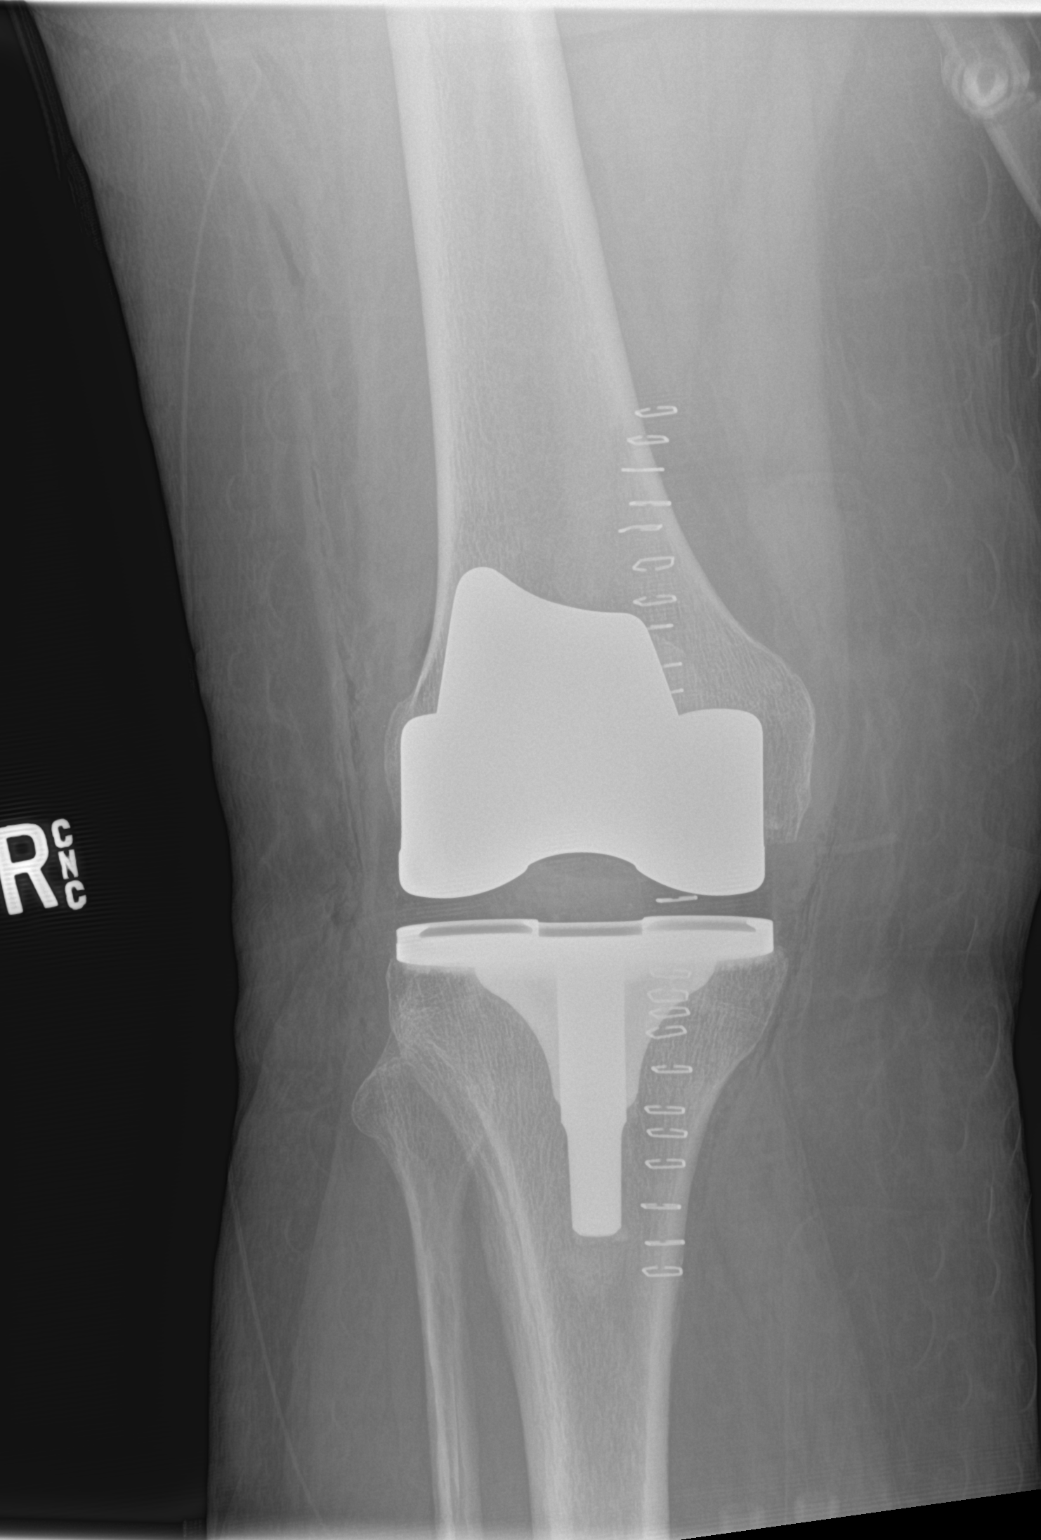

[knee lat]
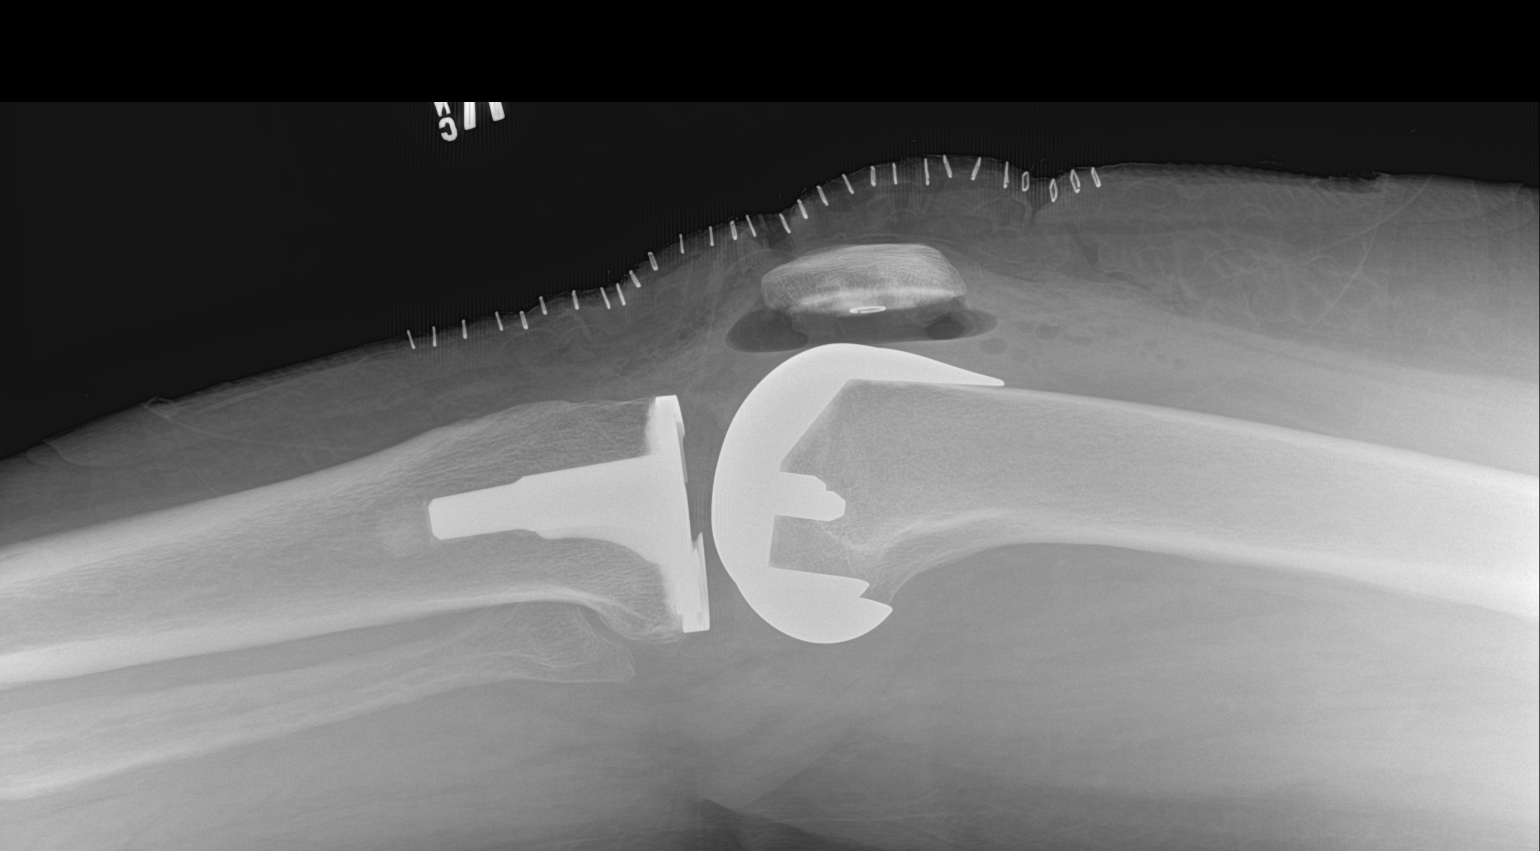

[2 of 2 positions shown; findings below may reference images not displayed]

FINDINGS: Portable AP and cross-table lateral views of the right knee. Total
knee arthroplasty hardware in place, appears intact, and normally
aligned. Postoperative air in fluid in the joint. Anterior skin
staples. No unexpected osseous changes.
IMPRESSION: Right total knee arthroplasty with no adverse features.

## 2019-04-15 DIAGNOSIS — R339 Retention of urine, unspecified: Secondary | ICD-10-CM | POA: Diagnosis not present

## 2019-05-05 ENCOUNTER — Telehealth: Payer: Self-pay

## 2019-05-05 DIAGNOSIS — K047 Periapical abscess without sinus: Secondary | ICD-10-CM

## 2019-05-05 MED ORDER — AMOXICILLIN 875 MG PO TABS: 875.0000 mg | ORAL_TABLET | Freq: Two times a day (BID) | ORAL | 0 refills | Status: DC

## 2019-05-05 NOTE — Telephone Encounter (Signed)
Copied from [id number]. Topic: General - Inquiry >> May 05, 2019  2:19 PM Percell Belt A wrote: Juliane Poot called in stated she has a broke tooth and trying to get in with her dentist asap but needs a antibiotic because she feel like it is infected now.    Best number [phone] [facility] [place]

## 2019-05-05 NOTE — Telephone Encounter (Signed)
Sent in Amoxil

## 2019-05-06 DIAGNOSIS — R69 Illness, unspecified: Secondary | ICD-10-CM | POA: Diagnosis not present

## 2019-05-13 ENCOUNTER — Other Ambulatory Visit: Payer: Self-pay | Admitting: Physician Assistant

## 2019-05-13 DIAGNOSIS — E7800 Pure hypercholesterolemia, unspecified: Secondary | ICD-10-CM

## 2019-05-14 ENCOUNTER — Other Ambulatory Visit: Payer: Self-pay | Admitting: Physician Assistant

## 2019-05-14 NOTE — Telephone Encounter (Signed)
Requested Prescriptions  Pending Prescriptions Disp Refills  . gabapentin (NEURONTIN) 300 MG capsule [Pharmacy Med Name: GABAPENTIN 300 MG CAPSULE] 270 capsule 1    Sig: TAKE 1 CAPSULE BY MOUTH THREE TIMES A DAY     Neurology: Anticonvulsants - gabapentin Passed - 05/14/2019  1:53 PM      Passed - Valid encounter within last 12 months    Recent Outpatient Visits          2 months ago Recurrent UTI   [place], [place], PA-C   6 months ago Recurrent UTI   [place], [place], PA-C   1 year ago Type 2 diabetes mellitus with diabetic polyneuropathy, without long-term current use of insulin [facility])   [facility] [place], Clearnce Sor[place], [place]   1 year ago Annual physical exam   [facility] [place], [place]   1 year ago Bilateral lower extremity edema   [place], [place] M, [place]             . triamcinolone ointment (KENALOG) 0.1 % [Pharmacy Med Name: TRIAMCINOLONE 0.1% OINTMENT] 30 g 0    Sig: APPLY TO [place]     Dermatology:  Corticosteroids Passed - 05/14/2019  1:53 PM      Passed - Valid encounter within last 12 months    Recent Outpatient Visits          2 months ago Recurrent UTI   [place], [place], PA-C   6 months ago Recurrent UTI   [place], [place], [place]   1 year ago Type 2 diabetes mellitus with diabetic polyneuropathy, without long-term current use of insulin [facility])   [place], Clearnce Sorrel, [place]   1 year ago Annual physical exam   [place], [place]   1 year ago Bilateral lower extremity edema   [facility] [place], [place], [place]

## 2019-05-16 ENCOUNTER — Other Ambulatory Visit: Payer: Self-pay | Admitting: Physician Assistant

## 2019-05-20 ENCOUNTER — Ambulatory Visit (INDEPENDENT_AMBULATORY_CARE_PROVIDER_SITE_OTHER): Payer: Medicare HMO | Admitting: Physician Assistant

## 2019-05-20 ENCOUNTER — Other Ambulatory Visit: Payer: Self-pay

## 2019-05-20 VITALS — BP 139/83 | HR 80 | Temp 97.3°F | Resp 16 | Wt 240.0 lb

## 2019-05-20 DIAGNOSIS — N3592 Unspecified urethral stricture, female: Secondary | ICD-10-CM

## 2019-05-20 DIAGNOSIS — N390 Urinary tract infection, site not specified: Secondary | ICD-10-CM

## 2019-05-20 DIAGNOSIS — R300 Dysuria: Secondary | ICD-10-CM | POA: Diagnosis not present

## 2019-05-20 LAB — POCT URINALYSIS DIPSTICK
Bilirubin, UA: NEGATIVE
Blood, UA: NEGATIVE
Glucose, UA: NEGATIVE
Ketones, UA: NEGATIVE
Nitrite, UA: NEGATIVE
Protein, UA: NEGATIVE
Spec Grav, UA: 1.01 (ref 1.010–1.025)
Urobilinogen, UA: 0.2 E.U./dL
pH, UA: 7 (ref 5.0–8.0)

## 2019-05-20 MED ORDER — CEPHALEXIN 500 MG PO CAPS: 500.0000 mg | ORAL_CAPSULE | Freq: Two times a day (BID) | ORAL | 0 refills | Status: AC

## 2019-05-20 NOTE — Patient Instructions (Addendum)

## 2019-05-20 NOTE — Progress Notes (Signed)
Patient: Jean Gonzales Female    DOB: [birth year]   64 y.o.   MRN: [id number] Visit Date: 05/20/2019  Today's Provider: Trinna Post, PA-C   Chief Complaint  Patient presents with  . Urinary Tract Infection   Subjective:    I Sulibeya S. Dimas, CMA, am acting as scribe for [employer], PA-C.  HPI Urinary Tract Infection: Patient complains of abnormal smelling urine, burning with urination and dysuria She has had symptoms for 1 week. Patient also complains of none. Patient denies back pain, fever and vaginal discharge. Patient does have a history of recurrent UTI.  Patient does have a history of pyelonephritis. Patient reports taking one dose of antibiotic today. Patient reports that her urine was very cloudy but after antibiotic urine has cleared up. Has been on Keflex fall 2020 for UTI and then ciprofloxacin. Has been on amoxicillin this year for a tooth infection and then clindamycin following dental surgery. She had some leftover keflex which she started taking today.     Allergies  Allergen Reactions  . Iodinated Diagnostic Agents Anaphylaxis  . Bactrim [Sulfamethoxazole-Trimethoprim] Itching and Rash     Current Outpatient Medications:  .  acetaminophen (TYLENOL) 325 MG tablet, Take 1-2 tablets (325-650 mg total) by mouth every 6 (six) hours as needed for mild pain (pain score 1-3 or temp > 100.5)., Disp: 30 tablet, Rfl: 0 .  aspirin EC 81 MG tablet, Take 81 mg by mouth daily., Disp: , Rfl:  .  atorvastatin (LIPITOR) 40 MG tablet, TAKE 1 TABLET (40 MG) BY MOUTH EVERY DAY IN THE MORNING., Disp: 90 tablet, Rfl: 0 .  celecoxib (CELEBREX) 100 MG capsule, TAKE 1 CAPSULE BY MOUTH TWICE A DAY, Disp: 180 capsule, Rfl: 1 .  diclofenac sodium (VOLTAREN) 1 % GEL, Apply 2 g topically 4 (four) times daily. Apply to knee, Disp: 400 g, Rfl: 3 .  esomeprazole (NEXIUM) 40 MG capsule, TAKE 1 CAPSULE BY MOUTH EVERY DAY; mylan brand only please, Disp: 90 capsule, Rfl: 1 .   furosemide (LASIX) 20 MG tablet, TAKE 1 TABLET BY MOUTH TWICE A DAY, Disp: 180 tablet, Rfl: 1 .  gabapentin (NEURONTIN) 300 MG capsule, TAKE 1 CAPSULE BY MOUTH THREE TIMES A DAY (Patient taking differently: as needed. ), Disp: 270 capsule, Rfl: 1 .  hydrochlorothiazide (HYDRODIURIL) 25 MG tablet, TAKE 1 TABLET BY MOUTH EVERY DAY, Disp: 90 tablet, Rfl: 3 .  nystatin (MYCOSTATIN/NYSTOP) powder, Apply topically 3 (three) times daily. (Patient taking differently: Apply topically as needed. ), Disp: 100 g, Rfl: 5 .  potassium chloride (K-DUR) 10 MEQ tablet, TAKE 1 TABLET BY MOUTH 2 TIMES DAILY. (Patient taking differently: as needed. ), Disp: 180 tablet, Rfl: 1 .  tamsulosin (FLOMAX) 0.4 MG CAPS capsule, TAKE 1 CAPSULE BY MOUTH EVERY DAY, Disp: 90 capsule, Rfl: 3 .  triamcinolone ointment (KENALOG) 0.1 %, APPLY TO AFFECTED AREA TWICE A DAY, Disp: 30 g, Rfl: 0 .  Turmeric, Curcuma Longa, (CURCUMIN) POWD, by Does not apply route. With ginger and black seed, Disp: , Rfl:  .  zolpidem (AMBIEN) 10 MG tablet, TAKE 1 TABLET BY MOUTH AT BEDTIME AS NEEDED FOR SLEEP., Disp: 90 tablet, Rfl: 1 .  amoxicillin (AMOXIL) 875 MG tablet, Take 1 tablet (875 mg total) by mouth 2 (two) times daily. (Patient not taking: Reported on 05/20/2019), Disp: 10 tablet, Rfl: 0  Review of Systems  Constitutional: Negative for chills and fever.  Cardiovascular: Negative for chest pain and palpitations.  Gastrointestinal: Negative for abdominal pain, constipation, diarrhea, nausea and vomiting.  Genitourinary: Negative for dysuria, flank pain, frequency, hematuria, pelvic pain and vaginal discharge.       Urine was very cloudy this morning    Social History   Tobacco Use  . Smoking status: Never Smoker  . Smokeless tobacco: Never Used  Substance Use Topics  . Alcohol use: No    Alcohol/week: 0.0 standard drinks      Objective:   BP 139/83 (BP Location: Left Arm, Patient Position: Sitting, Cuff Size: Large)   Pulse 80    Temp (!) 97.3 F (36.3 C) (Temporal)   Resp 16   Wt 240 lb (108.9 kg)   BMI 37.59 kg/m  Vitals:   05/20/19 1617  BP: 139/83  Pulse: 80  Resp: 16  Temp: (!) 97.3 F (36.3 C)  TempSrc: Temporal  Weight: 240 lb (108.9 kg)  Body mass index is 37.59 kg/m.   Physical Exam Constitutional:      Appearance: Normal appearance.  Cardiovascular:     Rate and Rhythm: Normal rate and regular rhythm.     Heart sounds: Normal heart sounds.  Pulmonary:     Effort: Pulmonary effort is normal.     Breath sounds: Normal breath sounds.  Abdominal:     General: Bowel sounds are normal.     Palpations: Abdomen is soft.     Tenderness: There is no abdominal tenderness. There is no right CVA tenderness or left CVA tenderness.  Neurological:     Mental Status: She is alert and oriented to person, place, and time. Mental status is at baseline.  Psychiatric:        Mood and Affect: Mood normal.        Behavior: Behavior normal.      Results for orders placed or performed in visit on 05/20/19  POCT urinalysis dipstick  Result Value Ref Range   Color, UA yellow    Clarity, UA clear    Glucose, UA Negative Negative   Bilirubin, UA Negative    Ketones, UA Negative    Spec Grav, UA 1.010 1.010 - 1.025   Blood, UA Negative    pH, UA 7.0 5.0 - 8.0   Protein, UA Negative Negative   Urobilinogen, UA 0.2 0.2 or 1.0 E.U./dL   Nitrite, UA Negative    Leukocytes, UA Large (3+) (A) Negative       Assessment & Plan    1. Recurrent UTI  Reviewed previous culture and will treat with keflex. Follow up PRN.  - cephALEXin (KEFLEX) 500 MG capsule; Take 1 capsule (500 mg total) by mouth 2 (two) times daily for 10 days.  Dispense: 20 capsule; Refill: 0  2. Dysuria  - POCT urinalysis dipstick - Urine Culture  3. Stricture of female urethra, unspecified stricture type  She reports she is I&O catheterizing daily with new kits each catheterization. She has not been using the glove but rather washing  her hands during catheterization. She will try using the glove from now on.   The entirety of the information documented in the History of Present Illness, Review of Systems and Physical Exam were personally obtained by me. Portions of this information were initially documented by [facility] and reviewed by me for thoroughness and accuracy.        Trinna Post, PA-C  [facility]

## 2019-05-23 LAB — URINE CULTURE

## 2019-06-10 NOTE — Progress Notes (Signed)
06/12/2019 4:27 PM   Jean Gonzales [birth year] [id number]  Referring provider: Mar Daring, PA-C [place],  [place] [address]  Chief Complaint  Patient presents with  . Recurrent UTI    HPI: Jean Gonzales is a 64 year old female with a history of urinary retention with hydronephrosis who presents today as a referral from her PCP for recurrent UTI's.    Urinary retention She had a total knee replacement in April 2019 and developed right flank pain and a CT showed right hydronephrosis.  Her bladder was noted to be distended and a Foley catheter was placed.  Her hydronephrosis resolved with bladder drainage and this had previously occurred in 2018.  RUS in 08/2017 noted no hydronephrosis.  She is self cathing once daily at this time and getting residuals of 500 cc or more.  There was also incident where she believes she may have forgot to cath the entire day and had residual of 1000 cc.  Today the pt complains of rUTI's. She reports of becoming immune to antibiotics every time she is switched to new antibiotics or when her doses are strengthened. She denies improvement in UTI symptoms.  She reports of dysuria, mid back pain which radiates to sides, cloudy urine with odor, and possibly hints of hematuria. Pt had reported of self-cathing once a day.  Pt states that her most recent culture x3 were positive, she has been taking antibiotics since 01/2019.  Pt denies any F/C/N/V but she does urinate more often.  She drinks about 8 bottles of water per day  + urine culture for Klebsiella pneumoniae resistant to ampicillin and nitrofurantoin 11/15/2018 + urine culture for Klebsiella pneumoniae resistant to ampicillin nitrofurantoin 02/19/2019 + urine culture for Enterobacter asburiae resistant to amoxicillin/clavulanic acid, cefazolin, cefuroxime and nitrofurantoin  CATH UA today yellow cloudy, specific area 1.020, pH 6.5, nitrite positive, trace  leukocyte, 11-30 WBCs, 0-10 epithelial cells and many bacteria.  PVR is 350 cc.  PMH: Past Medical History:  Diagnosis Date  . Ankylosing spondylitis ([place])   . Arthritis    osteoarthritis right knee  . Cancer [facility]) 2014   endometrial CA  . Diabetes mellitus without complication (HCC)    diet controlled  . Endometrial cancer ([place])   . GERD (gastroesophageal reflux disease)   . Hyperlipidemia   . Hypertension    improved after ca tx    Surgical History: Past Surgical History:  Procedure Laterality Date  . ABDOMINAL HYSTERECTOMY  2015   cancer  . BREAST BIOPSY Right 2008ish   core  . CATARACT EXTRACTION W/PHACO Right 02/05/2018   Procedure: CATARACT EXTRACTION PHACO AND INTRAOCULAR LENS PLACEMENT ([place]) RIGHT DIABETES IVA TOPICAL;  Surgeon: Eulogio Bear, MD;  Location: [place];  Service: Ophthalmology;  Laterality: Right;  . CATARACT EXTRACTION W/PHACO Left 02/25/2018   Procedure: CATARACT EXTRACTION PHACO AND INTRAOCULAR LENS PLACEMENT ([place]) LEFT DIABETIC;  Surgeon: Eulogio Bear, MD;  Location: [place];  Service: Ophthalmology;  Laterality: Left;  . CHOLECYSTECTOMY  1992  . TONSILLECTOMY  1973  . TONSILLECTOMY    . TOTAL KNEE ARTHROPLASTY Right 07/09/2017   Procedure: TOTAL KNEE ARTHROPLASTY;  Surgeon: Lovell Sheehan, MD;  Location: [facility];  Service: Orthopedics;  Laterality: Right;    Home Medications:  Allergies as of 06/12/2019      Reactions   Iodinated Diagnostic Agents Anaphylaxis   Bactrim [sulfamethoxazole-trimethoprim] Itching, Rash      Medication List  Accurate as of June 12, 2019  4:27 PM. If you have any questions, ask your nurse or doctor.        STOP taking these medications   amoxicillin 875 MG tablet Commonly known as: AMOXIL Stopped by: SHANNON MCGOWAN, PA-C   gabapentin 300 MG capsule Commonly known as: NEURONTIN Stopped by: SHANNON MCGOWAN, PA-C   potassium chloride 10 MEQ tablet Commonly known as:  KLOR-CON Stopped by: [name], PA-C     TAKE these medications   acetaminophen 325 MG tablet Commonly known as: TYLENOL Take 1-2 tablets (325-650 mg total) by mouth every 6 (six) hours as needed for mild pain (pain score 1-3 or temp > 100.5).   aspirin EC 81 MG tablet Take 81 mg by mouth daily.   atorvastatin 40 MG tablet Commonly known as: LIPITOR TAKE 1 TABLET (40 MG) BY MOUTH EVERY DAY IN THE MORNING.   celecoxib 100 MG capsule Commonly known as: CELEBREX TAKE 1 CAPSULE BY MOUTH TWICE A DAY   Curcumin Powd by Does not apply route. With ginger and black seed   diclofenac sodium 1 % Gel Commonly known as: VOLTAREN Apply 2 g topically 4 (four) times daily. Apply to knee   esomeprazole 40 MG capsule Commonly known as: NEXIUM TAKE 1 CAPSULE BY MOUTH EVERY DAY; mylan brand only please   furosemide 20 MG tablet Commonly known as: LASIX TAKE 1 TABLET BY MOUTH TWICE A DAY   hydrochlorothiazide 25 MG tablet Commonly known as: HYDRODIURIL TAKE 1 TABLET BY MOUTH EVERY DAY   nystatin powder Commonly known as: MYCOSTATIN/NYSTOP Apply topically 3 (three) times daily. What changed:   when to take this  reasons to take this   tamsulosin 0.4 MG Caps capsule Commonly known as: FLOMAX TAKE 1 CAPSULE BY MOUTH EVERY DAY   triamcinolone ointment 0.1 % Commonly known as: KENALOG APPLY TO AFFECTED AREA TWICE A DAY   zolpidem 10 MG tablet Commonly known as: AMBIEN TAKE 1 TABLET BY MOUTH AT BEDTIME AS NEEDED FOR SLEEP.       Allergies:  Allergies  Allergen Reactions  . Iodinated Diagnostic Agents Anaphylaxis  . Bactrim [Sulfamethoxazole-Trimethoprim] Itching and Rash    Family History: Family History  Problem Relation Age of Onset  . Diabetes Mother   . Cancer Mother   . Heart disease Father   . Breast cancer Neg Hx   . Kidney cancer Neg Hx   . Bladder Cancer Neg Hx     Social History:  reports that she has never smoked. She has never used smokeless  tobacco. She reports that she does not drink alcohol or use drugs.  ROS: For pertinent review of systems please refer to history of present illness  Physical Exam: BP (!) 149/90   Pulse (!) 105   Ht 5\' 7"  (1.702 m)   Wt 240 lb (108.9 kg)   BMI 37.59 kg/m   Constitutional:  Well nourished. Alert and oriented, No acute distress. HEENT: [place] AT, mask in place.  Trachea midline, no masses. Cardiovascular: No clubbing, cyanosis, or edema. Respiratory: Normal respiratory effort, no increased work of breathing. GI: Abdomen is soft, non tender, non distended, no abdominal masses.  GU: No CVA tenderness.  No bladder fullness or masses.  Atrophic external genitalia with radiation changes, sparse pubic hair distribution, no lesions.  Normal urethral meatus, no lesions, no prolapse, no discharge.   No urethral masses, tenderness and/or tenderness. No bladder fullness, tenderness or masses. Pale vagina mucosa, poor estrogen effect, no discharge,  no lesions, good pelvic support, no cystocele and no rectocele noted.  Cervix, uterus and adnexa are surgically absent.  Anus and perineum are without rashes or lesions.   Skin: No rashes, bruises or suspicious lesions. Lymph: No inguinal adenopathy. Neurologic: Grossly intact, no focal deficits, moving all 4 extremities. Psychiatric: Normal mood and affect.   Laboratory Data: Component     Latest Ref Rng & Units 06/12/2019  Specific Gravity, UA     1.005 - 1.030 1.020  pH, UA     5.0 - 7.5 6.5  Color, UA     Yellow Yellow  Appearance Ur     Clear Cloudy (A)  Leukocytes,UA     Negative Trace (A)  Protein,UA     Negative/Trace Negative  Glucose, UA     Negative Negative  Ketones, UA     Negative Negative  RBC, UA     Negative Negative  Bilirubin, UA     Negative Negative  Urobilinogen, Ur     0.2 - 1.0 mg/dL 0.2  Nitrite, UA     Negative Positive (A)  Microscopic Examination      See below:   Component     Latest Ref Rng & Units 06/12/2019   WBC, UA     0 - 5 /hpf 11-30 (A)  RBC     0 - 2 /hpf None seen  Epithelial Cells (non renal)     0 - 10 /hpf 0-10  Casts     None seen /lpf Present (A)  Cast Type     N/A Hyaline casts  Bacteria, UA     None seen/Few Many (A)     Assessment & Plan:    1. rUTI  UA/urine culture sent  Renal ultrasound ordered to evaluate for hydronephrosis Explained to the patient that having large residuals is likely contributing to her urinary issues.  I explained that the back pain, foul odor and cloudiness of the urine is caused by the urine being retained in her bladder for long periods of time.  I suggest that she increase her self caths to 3 times daily to empty her bladder more completely.   She will follow up to discuss renal ultrasound results  2.  Incomplete bladder emptying Patient will increase her catheterizations to 3 times daily   [name], PA-C  [facility] [address], [place], [place] [address] [phone]  I, Stacie Glaze, am acting as a Education administrator for [employer], PA-C.  I have reviewed the above documentation for accuracy and completeness, and I agree with the above.    [name], PA-C

## 2019-06-12 ENCOUNTER — Ambulatory Visit (INDEPENDENT_AMBULATORY_CARE_PROVIDER_SITE_OTHER): Payer: Medicare HMO | Admitting: Urology

## 2019-06-12 ENCOUNTER — Encounter: Payer: Self-pay | Admitting: Urology

## 2019-06-12 ENCOUNTER — Other Ambulatory Visit: Payer: Self-pay

## 2019-06-12 VITALS — BP 149/90 | HR 105 | Ht 67.0 in | Wt 240.0 lb

## 2019-06-12 DIAGNOSIS — R339 Retention of urine, unspecified: Secondary | ICD-10-CM | POA: Diagnosis not present

## 2019-06-12 DIAGNOSIS — N390 Urinary tract infection, site not specified: Secondary | ICD-10-CM

## 2019-06-12 LAB — MICROSCOPIC EXAMINATION: RBC, Urine: NONE SEEN /hpf (ref 0–2)

## 2019-06-12 LAB — URINALYSIS, COMPLETE
Bilirubin, UA: NEGATIVE
Glucose, UA: NEGATIVE
Ketones, UA: NEGATIVE
Nitrite, UA: POSITIVE — AB
Protein,UA: NEGATIVE
RBC, UA: NEGATIVE
Specific Gravity, UA: 1.02 (ref 1.005–1.030)
Urobilinogen, Ur: 0.2 mg/dL (ref 0.2–1.0)
pH, UA: 6.5 (ref 5.0–7.5)

## 2019-06-17 LAB — CULTURE, URINE COMPREHENSIVE

## 2019-06-18 ENCOUNTER — Other Ambulatory Visit: Payer: Self-pay | Admitting: *Deleted

## 2019-06-18 ENCOUNTER — Telehealth: Payer: Self-pay | Admitting: *Deleted

## 2019-06-18 MED ORDER — CIPROFLOXACIN HCL 500 MG PO TABS: 500.0000 mg | ORAL_TABLET | Freq: Two times a day (BID) | ORAL | 0 refills | Status: AC

## 2019-06-18 NOTE — Telephone Encounter (Signed)
-----   Message from Nori Riis, PA-C sent at 06/17/2019  4:48 PM EDT ----- Please let Mrs. Moczygemba know that her urine culture was positive for infection and she needs to start Cipro 500 mg BID x 7 days.

## 2019-06-18 NOTE — Telephone Encounter (Signed)
Rx sent , Cipro 500 mg BID x 7 days.

## 2019-06-19 NOTE — Telephone Encounter (Signed)
Notified patient as instructed, patient pleased. Discussed follow-up appointments, patient agrees  

## 2019-06-23 ENCOUNTER — Telehealth: Payer: Self-pay

## 2019-06-23 NOTE — Addendum Note (Signed)
Addended by: Doristine Devoid on: 06/23/2019 10:30 AM   Modules accepted: Orders

## 2019-06-23 NOTE — Telephone Encounter (Signed)
So the 2017 shows they wanted her to return to normal screenings? We may need to call [facility] to see what they want patient to have done.

## 2019-06-23 NOTE — Telephone Encounter (Signed)
Copied from [place] [id number]. Topic: Referral - Request for Referral >> Jun 23, 2019 10:07 AM Celene Kras wrote: Has patient seen PCP for this complaint? Yes.   *If NO, is insurance requiring patient see PCP for this issue before PCP can refer them? Referral for which specialty: mammogram Preferred provider/office: [facility] Reason for referral: Pt states that she is needing a diagnostic bilateral w/ left and right ultrasound

## 2019-06-26 ENCOUNTER — Other Ambulatory Visit: Payer: Self-pay | Admitting: Physician Assistant

## 2019-06-26 ENCOUNTER — Telehealth: Payer: Self-pay

## 2019-06-26 ENCOUNTER — Encounter: Payer: Self-pay | Admitting: Radiology

## 2019-06-26 ENCOUNTER — Ambulatory Visit: Payer: Self-pay | Admitting: Urology

## 2019-06-26 ENCOUNTER — Telehealth: Payer: Self-pay | Admitting: Physician Assistant

## 2019-06-26 DIAGNOSIS — N644 Mastodynia: Secondary | ICD-10-CM

## 2019-06-26 NOTE — Telephone Encounter (Signed)
MyChart message sent and LMOM to reschedule appointment for RUS and office visit.

## 2019-06-26 NOTE — Telephone Encounter (Signed)
Done at 11:12 this morning

## 2019-06-26 NOTE — Progress Notes (Incomplete)
06/26/19 12:28 AM   Jean Gonzales [birth year] [id number]  Referring provider: Mar Daring, PA-C [place],  [place] [address]  No chief complaint on file.   HPI: Jean Gonzales is a 64 y.o. F with a history of urinary retention and hydronephrosis returns today to discuss renal ultrasound results.   Urinary retention She had a total knee replacement in April 2019 and developed right flank pain and a CT showed right hydronephrosis. Her bladder was noted to be distended and a Foley catheter was placed. Her hydronephrosis resolved with bladder drainage and this had previously occurred in 2018. RUS in 08/2017 noted no hydronephrosis.  She is self cathing once daily at this time and getting residuals of 500 cc or more.  There was also incident where she believes she may have forgot to cath the entire day and had residual of 1000 cc.    + urine culture for Klebsiella pneumoniae resistant to ampicillin and nitrofurantoin 11/15/2018 + urine culture for Klebsiella pneumoniae resistant to ampicillin nitrofurantoin 02/19/2019 + urine culture for Enterobacter asburiae resistant to amoxicillin/clavulanic acid, cefazolin, cefuroxime and nitrofurantoin   PMH: Past Medical History:  Diagnosis Date  . Ankylosing spondylitis ([place])   . Arthritis    osteoarthritis right knee  . Cancer [facility]) 2014   endometrial CA  . Diabetes mellitus without complication (HCC)    diet controlled  . Endometrial cancer ([place])   . GERD (gastroesophageal reflux disease)   . Hyperlipidemia   . Hypertension    improved after ca tx    Surgical History: Past Surgical History:  Procedure Laterality Date  . ABDOMINAL HYSTERECTOMY  2015   cancer  . BREAST BIOPSY Right 2008ish   core  . CATARACT EXTRACTION W/PHACO Right 02/05/2018   Procedure: CATARACT EXTRACTION PHACO AND INTRAOCULAR LENS PLACEMENT ([place]) RIGHT DIABETES IVA TOPICAL;  Surgeon: Eulogio Bear, MD;   Location: [place];  Service: Ophthalmology;  Laterality: Right;  . CATARACT EXTRACTION W/PHACO Left 02/25/2018   Procedure: CATARACT EXTRACTION PHACO AND INTRAOCULAR LENS PLACEMENT ([place]) LEFT DIABETIC;  Surgeon: Eulogio Bear, MD;  Location: [facility];  Service: Ophthalmology;  Laterality: Left;  . CHOLECYSTECTOMY  1992  . TONSILLECTOMY  1973  . TONSILLECTOMY    . TOTAL KNEE ARTHROPLASTY Right 07/09/2017   Procedure: TOTAL KNEE ARTHROPLASTY;  Surgeon: Lovell Sheehan, MD;  Location: [facility];  Service: Orthopedics;  Laterality: Right;    Home Medications:  Allergies as of 06/26/2019      Reactions   Iodinated Diagnostic Agents Anaphylaxis   Bactrim [sulfamethoxazole-trimethoprim] Itching, Rash      Medication List       Accurate as of June 26, 2019 12:28 AM. If you have any questions, ask your nurse or doctor.        acetaminophen 325 MG tablet Commonly known as: TYLENOL Take 1-2 tablets (325-650 mg total) by mouth every 6 (six) hours as needed for mild pain (pain score 1-3 or temp > 100.5).   aspirin EC 81 MG tablet Take 81 mg by mouth daily.   atorvastatin 40 MG tablet Commonly known as: LIPITOR TAKE 1 TABLET (40 MG) BY MOUTH EVERY DAY IN THE MORNING.   celecoxib 100 MG capsule Commonly known as: CELEBREX TAKE 1 CAPSULE BY MOUTH TWICE A DAY   Curcumin Powd by Does not apply route. With ginger and black seed   diclofenac sodium 1 % Gel Commonly known as: VOLTAREN Apply 2 g topically 4 (  four) times daily. Apply to knee   esomeprazole 40 MG capsule Commonly known as: NEXIUM TAKE 1 CAPSULE BY MOUTH EVERY DAY; mylan brand only please   furosemide 20 MG tablet Commonly known as: LASIX TAKE 1 TABLET BY MOUTH TWICE A DAY   hydrochlorothiazide 25 MG tablet Commonly known as: HYDRODIURIL TAKE 1 TABLET BY MOUTH EVERY DAY   nystatin powder Commonly known as: MYCOSTATIN/NYSTOP Apply topically 3 (three) times daily. What changed:   when to  take this  reasons to take this   tamsulosin 0.4 MG Caps capsule Commonly known as: FLOMAX TAKE 1 CAPSULE BY MOUTH EVERY DAY   triamcinolone ointment 0.1 % Commonly known as: KENALOG APPLY TO AFFECTED AREA TWICE A DAY   zolpidem 10 MG tablet Commonly known as: AMBIEN TAKE 1 TABLET BY MOUTH AT BEDTIME AS NEEDED FOR SLEEP.       Allergies:  Allergies  Allergen Reactions  . Iodinated Diagnostic Agents Anaphylaxis  . Bactrim [Sulfamethoxazole-Trimethoprim] Itching and Rash    Family History: Family History  Problem Relation Age of Onset  . Diabetes Mother   . Cancer Mother   . Heart disease Father   . Breast cancer Neg Hx   . Kidney cancer Neg Hx   . Bladder Cancer Neg Hx     Social History:  reports that she has never smoked. She has never used smokeless tobacco. She reports that she does not drink alcohol or use drugs.   Physical Exam: There were no vitals taken for this visit.  Constitutional:  Alert and oriented, No acute distress. HEENT: [place] AT, moist mucus membranes.  Trachea midline, no masses. Cardiovascular: No clubbing, cyanosis, or edema. Respiratory: Normal respiratory effort, no increased work of breathing. GI: Abdomen is soft, nontender, nondistended, no abdominal masses GU: No CVA tenderness Lymph: No cervical or inguinal lymphadenopathy. Skin: No rashes, bruises or suspicious lesions. Neurologic: Grossly intact, no focal deficits, moving all 4 extremities. Psychiatric: Normal mood and affect.  Laboratory Data:  Urinalysis  Pertinent Imaging: ***  Assessment & Plan:    @DIAGMED @  No follow-ups on file.  [facility] [address], [place], [place] [address] [phone]  I, Lucas Mallow, am acting as a Education administrator for [employer],  {Add Scribe Attestation Statement}

## 2019-06-26 NOTE — Telephone Encounter (Signed)
Patient returned call to reschedule office visit. She was transferred to the radiology scheduling office to schedule a renal ultrasound.

## 2019-06-26 NOTE — Addendum Note (Signed)
Addended by: Doristine Devoid on: 06/26/2019 11:38 AM   Modules accepted: Orders

## 2019-06-26 NOTE — Telephone Encounter (Signed)
Left message for patient to schedule Annual Wellness Visit.  Please schedule with Nurse Health Advisor Victoria Britt, RN at [facility]  

## 2019-06-26 NOTE — Telephone Encounter (Signed)
Copied from [place] [id number]. Topic: General - Other >> Jun 26, 2019 11:18 AM Rainey Pines A wrote: Patient stated that [facility] was sending over order for patients mammogram and needs Fenton Malling to sign off on it and send it back to them. Please advise

## 2019-06-26 NOTE — Telephone Encounter (Signed)
Ordered this morning

## 2019-06-26 NOTE — Telephone Encounter (Signed)
Please review. KW 

## 2019-06-26 NOTE — Telephone Encounter (Signed)
Patient called in again checking on status of order, and was a bit irritated it has not been done yet, as she gave agent all information needed. Please advise.

## 2019-07-02 ENCOUNTER — Ambulatory Visit
Admission: RE | Admit: 2019-07-02 | Discharge: 2019-07-02 | Disposition: A | Discharge status: Home / Self Care | Payer: Medicare HMO | Source: Ambulatory Visit | Attending: Urology | Admitting: Urology

## 2019-07-02 ENCOUNTER — Other Ambulatory Visit: Payer: Self-pay

## 2019-07-02 DIAGNOSIS — N390 Urinary tract infection, site not specified: Secondary | ICD-10-CM | POA: Insufficient documentation

## 2019-07-03 ENCOUNTER — Ambulatory Visit (INDEPENDENT_AMBULATORY_CARE_PROVIDER_SITE_OTHER): Payer: Medicare HMO | Admitting: Physician Assistant

## 2019-07-03 ENCOUNTER — Other Ambulatory Visit: Payer: Self-pay | Admitting: Physician Assistant

## 2019-07-03 ENCOUNTER — Encounter: Payer: Self-pay | Admitting: Physician Assistant

## 2019-07-03 VITALS — BP 142/88 | HR 76 | Temp 96.9°F | Resp 16 | Ht 67.0 in | Wt 240.8 lb

## 2019-07-03 DIAGNOSIS — Z0000 Encounter for general adult medical examination without abnormal findings: Secondary | ICD-10-CM

## 2019-07-03 DIAGNOSIS — Z780 Asymptomatic menopausal state: Secondary | ICD-10-CM | POA: Diagnosis not present

## 2019-07-03 DIAGNOSIS — Z1211 Encounter for screening for malignant neoplasm of colon: Secondary | ICD-10-CM

## 2019-07-03 DIAGNOSIS — L0100 Impetigo, unspecified: Secondary | ICD-10-CM

## 2019-07-03 DIAGNOSIS — Z1159 Encounter for screening for other viral diseases: Secondary | ICD-10-CM

## 2019-07-03 DIAGNOSIS — E1142 Type 2 diabetes mellitus with diabetic polyneuropathy: Secondary | ICD-10-CM

## 2019-07-03 DIAGNOSIS — H6123 Impacted cerumen, bilateral: Secondary | ICD-10-CM | POA: Diagnosis not present

## 2019-07-03 DIAGNOSIS — C541 Malignant neoplasm of endometrium: Secondary | ICD-10-CM

## 2019-07-03 DIAGNOSIS — I10 Essential (primary) hypertension: Secondary | ICD-10-CM

## 2019-07-03 DIAGNOSIS — Z1239 Encounter for other screening for malignant neoplasm of breast: Secondary | ICD-10-CM

## 2019-07-03 DIAGNOSIS — E7800 Pure hypercholesterolemia, unspecified: Secondary | ICD-10-CM | POA: Diagnosis not present

## 2019-07-03 DIAGNOSIS — K047 Periapical abscess without sinus: Secondary | ICD-10-CM | POA: Diagnosis not present

## 2019-07-03 DIAGNOSIS — Z13820 Encounter for screening for osteoporosis: Secondary | ICD-10-CM

## 2019-07-03 MED ORDER — CLINDAMYCIN HCL 300 MG PO CAPS: 300.0000 mg | ORAL_CAPSULE | Freq: Three times a day (TID) | ORAL | 0 refills | Status: DC

## 2019-07-03 MED ORDER — AMOXICILLIN 875 MG PO TABS: 875.0000 mg | ORAL_TABLET | Freq: Two times a day (BID) | ORAL | 0 refills | Status: DC

## 2019-07-03 MED ORDER — NYSTATIN 100000 UNIT/GM EX POWD: Freq: Three times a day (TID) | CUTANEOUS | 5 refills | Status: DC

## 2019-07-03 NOTE — Patient Instructions (Signed)

## 2019-07-03 NOTE — Progress Notes (Signed)
Annual Wellness Visit      Patient: Jean Gonzales, Female    DOB: May 08, 1955, 64 y.o.   MRN: [id number] Visit Date: 07/06/2019  Today's Provider: Mar Daring, PA-C  Subjective:    Chief Complaint  Patient presents with  . Medicare Wellness   Jean Gonzales is a 64 y.o. female who presents today for her Annual Wellness Visit.  HPI Mammogram scheduled for 07/07/19 Eye exam:She is Due for her lenses to be checked. Colonoscopy: She is Due Foot Exam:She is Due Bone Density: She is due  Patient Active Problem List   Diagnosis Date Noted  . Depression, major, single episode, moderate ([place]) 02/19/2019  . Chest pain 09/29/2017  . History of total knee arthroplasty 08/01/2017  . Osteoarthritis of right knee 07/09/2017  . Osteoarthritis 05/07/2017  . Synovitis of knee 12/29/2016  . Knee joint effusion 12/29/2016  . Malignant neoplasm of endometrium ([place]) 02/22/2015  . Acid reflux 02/22/2015  . History of migraine headaches 02/22/2015  . HLD (hyperlipidemia) 02/22/2015  . BP (high blood pressure) 02/22/2015  . History of colon polyps 02/22/2015  . Urinary incontinence 02/22/2015  . Psoriatic arthritis ([place]) 02/22/2015  . Diabetes mellitus ([place]) 10/06/2014  . Urethral meatal stenosis 12/03/2013  . Rheumatoid arthritis ([place]) 03/14/2013   Past Medical History:  Diagnosis Date  . Ankylosing spondylitis ([place])   . Arthritis    osteoarthritis right knee  . Cancer [facility]) 2014   endometrial CA  . Diabetes mellitus without complication (HCC)    diet controlled  . Endometrial cancer ([place])   . GERD (gastroesophageal reflux disease)   . Hyperlipidemia   . Hypertension    improved after ca tx   Past Surgical History:  Procedure Laterality Date  . ABDOMINAL HYSTERECTOMY  2015   cancer  . BREAST BIOPSY Right 2008ish   core  . CATARACT EXTRACTION W/PHACO Right 02/05/2018   Procedure: CATARACT EXTRACTION PHACO AND INTRAOCULAR LENS PLACEMENT ([place]) RIGHT  DIABETES IVA TOPICAL;  Surgeon: Eulogio Bear, MD;  Location: [place];  Service: Ophthalmology;  Laterality: Right;  . CATARACT EXTRACTION W/PHACO Left 02/25/2018   Procedure: CATARACT EXTRACTION PHACO AND INTRAOCULAR LENS PLACEMENT ([place]) LEFT DIABETIC;  Surgeon: Eulogio Bear, MD;  Location: [place];  Service: Ophthalmology;  Laterality: Left;  . CHOLECYSTECTOMY  1992  . TONSILLECTOMY  1973  . TONSILLECTOMY    . TOTAL KNEE ARTHROPLASTY Right 07/09/2017   Procedure: TOTAL KNEE ARTHROPLASTY;  Surgeon: Lovell Sheehan, MD;  Location: [facility];  Service: Orthopedics;  Laterality: Right;   Social History   Tobacco Use  . Smoking status: Never Smoker  . Smokeless tobacco: Never Used  Substance Use Topics  . Alcohol use: No    Alcohol/week: 0.0 standard drinks  . Drug use: No   Allergies  Allergen Reactions  . Iodinated Diagnostic Agents Anaphylaxis  . Bactrim [Sulfamethoxazole-Trimethoprim] Itching and Rash       Medications: Outpatient Medications Prior to Visit  Medication Sig  . acetaminophen (TYLENOL) 325 MG tablet Take 1-2 tablets (325-650 mg total) by mouth every 6 (six) hours as needed for mild pain (pain score 1-3 or temp > 100.5).  Marland Kitchen aspirin EC 81 MG tablet Take 81 mg by mouth daily.  Marland Kitchen atorvastatin (LIPITOR) 40 MG tablet TAKE 1 TABLET (40 MG) BY MOUTH EVERY DAY IN THE MORNING.  . celecoxib (CELEBREX) 100 MG capsule TAKE 1 CAPSULE BY MOUTH TWICE A DAY  . diclofenac sodium (VOLTAREN) 1 %  GEL Apply 2 g topically 4 (four) times daily. Apply to knee  . esomeprazole (NEXIUM) 40 MG capsule TAKE 1 CAPSULE BY MOUTH EVERY DAY; mylan brand only please  . tamsulosin (FLOMAX) 0.4 MG CAPS capsule TAKE 1 CAPSULE BY MOUTH EVERY DAY  . triamcinolone ointment (KENALOG) 0.1 % APPLY TO AFFECTED AREA TWICE A DAY  . Turmeric, Curcuma Longa, (CURCUMIN) POWD by Does not apply route. With ginger and black seed  . [DISCONTINUED] nystatin (MYCOSTATIN/NYSTOP) powder  Apply topically 3 (three) times daily. (Patient taking differently: Apply topically as needed. )  . furosemide (LASIX) 20 MG tablet TAKE 1 TABLET BY MOUTH TWICE A DAY (Patient not taking: Reported on 07/03/2019)  . hydrochlorothiazide (HYDRODIURIL) 25 MG tablet TAKE 1 TABLET BY MOUTH EVERY DAY (Patient not taking: Reported on 07/03/2019)  . zolpidem (AMBIEN) 10 MG tablet TAKE 1 TABLET BY MOUTH AT BEDTIME AS NEEDED FOR SLEEP. (Patient not taking: Reported on 07/03/2019)   No facility-administered medications prior to visit.    Allergies  Allergen Reactions  . Iodinated Diagnostic Agents Anaphylaxis  . Bactrim [Sulfamethoxazole-Trimethoprim] Itching and Rash    Patient Care Team: Mar Daring, PA-C as PCP - General (Family Medicine)  Review of Systems  HENT: Positive for dental problem.   Gastrointestinal: Positive for abdominal pain.  Genitourinary: Positive for difficulty urinating.  Musculoskeletal: Positive for arthralgias, back pain, joint swelling, neck pain and neck stiffness.  Neurological: Positive for weakness and headaches.  All other systems reviewed and are negative.   Last CBC Lab Results  Component Value Date   WBC 8.8 07/03/2019   HGB 13.6 07/03/2019   HCT 40.7 07/03/2019   MCV 89 07/03/2019   MCH 29.8 07/03/2019   RDW 13.1 07/03/2019   PLT 239 [id number]   Last metabolic panel Lab Results  Component Value Date   GLUCOSE 115 (H) 07/03/2019   NA 140 07/03/2019   K 4.1 07/03/2019   CL 102 07/03/2019   CO2 22 07/03/2019   BUN 11 07/03/2019   CREATININE 1.00 07/03/2019   GFRNONAA 59 (L) 07/03/2019   GFRAA 68 07/03/2019   CALCIUM 9.6 07/03/2019   PHOS 3.4 05/21/2018   PROT 7.4 07/03/2019   ALBUMIN 4.4 07/03/2019   LABGLOB 3.0 07/03/2019   AGRATIO 1.5 07/03/2019   BILITOT 0.5 07/03/2019   ALKPHOS 130 (H) 07/03/2019   AST 22 07/03/2019   ALT 21 07/03/2019   ANIONGAP 10 10/10/2017   Last lipids Lab Results  Component Value Date   CHOL 169  07/03/2019   HDL 38 (L) 07/03/2019   LDLCALC 90 07/03/2019   TRIG 242 (H) 07/03/2019   CHOLHDL 4.4 07/03/2019   Last hemoglobin A1c Lab Results  Component Value Date   HGBA1C 7.1 (H) 07/03/2019       Objective:    Vitals: BP (!) 142/88 (BP Location: Left Arm, Patient Position: Sitting, Cuff Size: Large)   Pulse 76   Temp (!) 96.9 F (36.1 C) (Temporal)   Resp 16   Ht 5\' 7"  (1.702 m)   Wt 240 lb 12.8 oz (109.2 kg)   BMI 37.71 kg/m  BP Readings from Last 3 Encounters:  07/03/19 (!) 142/88  06/12/19 (!) 149/90  05/20/19 139/83      Physical Exam Vitals reviewed.  Constitutional:      General: She is not in acute distress.    Appearance: Normal appearance. She is obese. She is not ill-appearing.  HENT:     Head: Normocephalic and atraumatic.  Right Ear: Hearing, tympanic membrane, ear canal and external ear normal. There is impacted cerumen.     Left Ear: Hearing, tympanic membrane, ear canal and external ear normal. There is impacted cerumen.     Nose: Nose normal.     Mouth/Throat:     Mouth: Mucous membranes are moist.     Dentition: Abnormal dentition. Dental tenderness, dental caries and dental abscesses present. No gum lesions.     Tongue: No lesions.     Palate: No mass and lesions.     Pharynx: Oropharynx is clear. Uvula midline. No oropharyngeal exudate or posterior oropharyngeal erythema.  Eyes:     General: No scleral icterus.       Right eye: No discharge.        Left eye: No discharge.     Extraocular Movements: Extraocular movements intact.     Conjunctiva/sclera: Conjunctivae normal.     Pupils: Pupils are equal, round, and reactive to light.  Neck:     Vascular: No carotid bruit.  Cardiovascular:     Rate and Rhythm: Normal rate and regular rhythm.     Pulses: Normal pulses.     Heart sounds: Normal heart sounds.  Pulmonary:     Effort: Pulmonary effort is normal.     Breath sounds: Normal breath sounds.  Abdominal:     General: Abdomen  is flat. Bowel sounds are normal.     Tenderness: There is no right CVA tenderness or left CVA tenderness.  Musculoskeletal:        General: Normal range of motion.     Cervical back: Normal range of motion and neck supple.     Right lower leg: No edema.     Left lower leg: No edema.  Lymphadenopathy:     Cervical: No cervical adenopathy.  Skin:    General: Skin is warm and dry.     Capillary Refill: Capillary refill takes less than 2 seconds.  Neurological:     General: No focal deficit present.     Mental Status: She is alert and oriented to person, place, and time. Mental status is at baseline.  Psychiatric:        Mood and Affect: Mood normal.        Behavior: Behavior normal.        Thought Content: Thought content normal.        Judgment: Judgment normal.     Most recent functional status assessment: In your present state of health, do you have any difficulty performing the following activities: 07/03/2019  Hearing? N  Vision? Y  Comment "some"  Difficulty concentrating or making decisions? N  Walking or climbing stairs? Y  Dressing or bathing? N  Doing errands, shopping? Y  Comment "some"  Some recent data might be hidden    Most recent fall risk assessment: No flowsheet data found.   Most recent depression screenings: PHQ 2/9 Scores 07/03/2019 11/15/2018  PHQ - 2 Score 0 0  PHQ- 9 Score 3 0    Most recent cognitive screening: No flowsheet data found.  Results for orders placed or performed in visit on 07/03/19  CBC with Differential/Platelet  Result Value Ref Range   WBC 8.8 3.4 - 10.8 x10E3/uL   RBC 4.57 3.77 - 5.28 x10E6/uL   Hemoglobin 13.6 11.1 - 15.9 g/dL   Hematocrit 40.7 34.0 - 46.6 %   MCV 89 79 - 97 fL   MCH 29.8 26.6 - 33.0 pg   MCHC 33.4 31.5 -  35.7 g/dL   RDW 13.1 11.7 - 15.4 %   Platelets 239 150 - 450 x10E3/uL   Neutrophils 53 Not Estab. %   Lymphs 38 Not Estab. %   Monocytes 6 Not Estab. %   Eos 2 Not Estab. %   Basos 1 Not Estab. %    Neutrophils Absolute 4.8 1.4 - 7.0 x10E3/uL   Lymphocytes Absolute 3.3 (H) 0.7 - 3.1 x10E3/uL   Monocytes Absolute 0.5 0.1 - 0.9 x10E3/uL   EOS (ABSOLUTE) 0.1 0.0 - 0.4 x10E3/uL   Basophils Absolute 0.0 0.0 - 0.2 x10E3/uL   Immature Granulocytes 0 Not Estab. %   Immature Grans (Abs) 0.0 0.0 - 0.1 x10E3/uL  Comprehensive metabolic panel  Result Value Ref Range   Glucose 115 (H) 65 - 99 mg/dL   BUN 11 8 - 27 mg/dL   Creatinine, Ser 1.00 0.57 - 1.00 mg/dL   GFR calc non Af Amer 59 (L) >59 mL/min/1.73   GFR calc Af Amer 68 >59 mL/min/1.73   BUN/Creatinine Ratio 11 (L) 12 - 28   Sodium 140 134 - 144 mmol/L   Potassium 4.1 3.5 - 5.2 mmol/L   Chloride 102 96 - 106 mmol/L   CO2 22 20 - 29 mmol/L   Calcium 9.6 8.7 - 10.3 mg/dL   Total Protein 7.4 6.0 - 8.5 g/dL   Albumin 4.4 3.8 - 4.8 g/dL   Globulin, Total 3.0 1.5 - 4.5 g/dL   Albumin/Globulin Ratio 1.5 1.2 - 2.2   Bilirubin Total 0.5 0.0 - 1.2 mg/dL   Alkaline Phosphatase 130 (H) 39 - 117 IU/L   AST 22 0 - 40 IU/L   ALT 21 0 - 32 IU/L  Lipid panel  Result Value Ref Range   Cholesterol, Total 169 100 - 199 mg/dL   Triglycerides 242 (H) 0 - 149 mg/dL   HDL 38 (L) >39 mg/dL   VLDL Cholesterol Cal 41 (H) 5 - 40 mg/dL   LDL Chol Calc (NIH) 90 0 - 99 mg/dL   Chol/HDL Ratio 4.4 0.0 - 4.4 ratio  Hemoglobin A1c  Result Value Ref Range   Hgb A1c MFr Bld 7.1 (H) 4.8 - 5.6 %   Est. average glucose Bld gHb Est-mCnc 157 mg/dL  CA 125  Result Value Ref Range   Cancer Antigen (CA) 125 6.8 0.0 - 38.1 U/mL  TSH  Result Value Ref Range   TSH 1.070 0.450 - 4.500 uIU/mL  Hepatitis C antibody  Result Value Ref Range   Hep C Virus Ab <0.1 0.0 - 0.9 s/co ratio      Assessment & Plan:      Annual wellness visit done today including the all of the following: Reviewed patient's Family Medical History Reviewed and updated list of patient's medical providers Assessment of cognitive impairment was done Assessed patient's functional  ability Established a written schedule for health screening [place] Completed and Reviewed  Exercise Activities and Dietary recommendations Goals   None     Immunization History  Administered Date(s) Administered  . Influenza,inj,Quad PF,6+ Mos 12/18/2017  . Pneumococcal Polysaccharide-23 12/05/2010    Health Maintenance  Topic Date Due  . FOOT EXAM  Never done  . OPHTHALMOLOGY EXAM  Never done  . TETANUS/TDAP  Never done  . COLONOSCOPY  Never done  . MAMMOGRAM  03/21/2018  . URINE MICROALBUMIN  06/26/2018  . DEXA SCAN  Never done  . PNA vac Low Risk Adult (1 of 2 - PCV13) 12/18/2018  .  PAP SMEAR-Modifier  07/24/2020 (Originally 11/20/2016)  . INFLUENZA VACCINE  10/26/2019  . HEMOGLOBIN A1C  01/02/2020  . COLON CANCER SCREENING ANNUAL FOBT  01/07/2020  . Hepatitis C Screening  Completed  . HIV Screening  Completed     Discussed health benefits of physical activity, and encouraged her to engage in regular exercise appropriate for her age and condition.    1. Medicare annual physical exam Normal exam. Up to date on screenings and vaccinations.  2. Encounter for breast cancer screening using non-mammogram modality Mammogram already scheduled for 07/07/19.  3. Colon cancer screening Due for colonoscopy. Referral placed as below.  - Ambulatory referral to Gastroenterology  4. Impetigo Noted under skin folds. Nystatin given as below.  - nystatin (MYCOSTATIN/NYSTOP) powder; Apply topically 3 (three) times daily.  Dispense: 100 g; Refill: 5  5. Essential hypertension Stable. Will check labs as below and f/u pending results. - CBC with Differential/Platelet - Comprehensive metabolic panel - Lipid panel - Hemoglobin A1c - TSH  6. Type 2 diabetes mellitus with diabetic polyneuropathy, without long-term current use of insulin (HCC) Stable. Diet controlled. Will check labs as below and f/u pending results. - CBC with Differential/Platelet -  Comprehensive metabolic panel - Lipid panel - Hemoglobin A1c - TSH  7. Pure hypercholesterolemia Stable. Continue Atorvastatin 40mg . Will check labs as below and f/u pending results. - CBC with Differential/Platelet - Comprehensive metabolic panel - Lipid panel - Hemoglobin A1c - TSH  8. Osteoporosis screening Due for BMD. Ordered. - DG Bone Density; Future  9. Postmenopausal estrogen deficiency See above medical treatment plan. - DG Bone Density; Future  10. Encounter for hepatitis C screening test for low risk patient Will check labs as below and f/u pending results. - Hepatitis C antibody  11. Malignant neoplasm of endometrium ([place]) H/O this. Cleared by Gyn Onc. Patient request regular GYN to follow her. Referral placed. Will check labs as below and f/u pending results. - Ambulatory referral to Gynecology - CA 125  12. Tooth abscess Noted on exam. Patient unable to see dentist until later this month. Clindamycin sent in.   - clindamycin (CLEOCIN) 300 MG capsule; Take 1 capsule (300 mg total) by mouth 3 (three) times daily.  Dispense: 30 capsule; Refill: 0  13. Bilateral impacted cerumen Bilateral lavage was successful. Continue Debrox drops at home.  - Ear Lavage     Rubye Beach  [facility] [phone] (phone) [phone] (fax)  [place]

## 2019-07-04 LAB — CBC WITH DIFFERENTIAL/PLATELET
Basophils Absolute: 0 10*3/uL (ref 0.0–0.2)
Basos: 1 %
EOS (ABSOLUTE): 0.1 10*3/uL (ref 0.0–0.4)
Eos: 2 %
Hematocrit: 40.7 % (ref 34.0–46.6)
Hemoglobin: 13.6 g/dL (ref 11.1–15.9)
Immature Grans (Abs): 0 10*3/uL (ref 0.0–0.1)
Immature Granulocytes: 0 %
Lymphocytes Absolute: 3.3 10*3/uL — ABNORMAL HIGH (ref 0.7–3.1)
Lymphs: 38 %
MCH: 29.8 pg (ref 26.6–33.0)
MCHC: 33.4 g/dL (ref 31.5–35.7)
MCV: 89 fL (ref 79–97)
Monocytes Absolute: 0.5 10*3/uL (ref 0.1–0.9)
Monocytes: 6 %
Neutrophils Absolute: 4.8 10*3/uL (ref 1.4–7.0)
Neutrophils: 53 %
Platelets: 239 10*3/uL (ref 150–450)
RBC: 4.57 x10E6/uL (ref 3.77–5.28)
RDW: 13.1 % (ref 11.7–15.4)
WBC: 8.8 10*3/uL (ref 3.4–10.8)

## 2019-07-04 LAB — COMPREHENSIVE METABOLIC PANEL
ALT: 21 IU/L (ref 0–32)
AST: 22 IU/L (ref 0–40)
Albumin/Globulin Ratio: 1.5 (ref 1.2–2.2)
Albumin: 4.4 g/dL (ref 3.8–4.8)
Alkaline Phosphatase: 130 IU/L — ABNORMAL HIGH (ref 39–117)
BUN/Creatinine Ratio: 11 — ABNORMAL LOW (ref 12–28)
BUN: 11 mg/dL (ref 8–27)
Bilirubin Total: 0.5 mg/dL (ref 0.0–1.2)
CO2: 22 mmol/L (ref 20–29)
Calcium: 9.6 mg/dL (ref 8.7–10.3)
Chloride: 102 mmol/L (ref 96–106)
Creatinine, Ser: 1 mg/dL (ref 0.57–1.00)
GFR calc Af Amer: 68 mL/min/{1.73_m2} (ref >59)
GFR calc non Af Amer: 59 mL/min/{1.73_m2} — ABNORMAL LOW (ref >59)
Globulin, Total: 3 g/dL (ref 1.5–4.5)
Glucose: 115 mg/dL — ABNORMAL HIGH (ref 65–99)
Potassium: 4.1 mmol/L (ref 3.5–5.2)
Sodium: 140 mmol/L (ref 134–144)
Total Protein: 7.4 g/dL (ref 6.0–8.5)

## 2019-07-04 LAB — LIPID PANEL
Chol/HDL Ratio: 4.4 ratio (ref 0.0–4.4)
Cholesterol, Total: 169 mg/dL (ref 100–199)
HDL: 38 mg/dL — ABNORMAL LOW (ref >39)
LDL Chol Calc (NIH): 90 mg/dL (ref 0–99)
Triglycerides: 242 mg/dL — ABNORMAL HIGH (ref 0–149)
VLDL Cholesterol Cal: 41 mg/dL — ABNORMAL HIGH (ref 5–40)

## 2019-07-04 LAB — CA 125: Cancer Antigen (CA) 125: 6.8 U/mL (ref 0.0–38.1)

## 2019-07-04 LAB — HEMOGLOBIN A1C
Est. average glucose Bld gHb Est-mCnc: 157 mg/dL
Hgb A1c MFr Bld: 7.1 % — ABNORMAL HIGH (ref 4.8–5.6)

## 2019-07-04 LAB — TSH: TSH: 1.07 u[IU]/mL (ref 0.450–4.500)

## 2019-07-04 LAB — HEPATITIS C ANTIBODY: Hep C Virus Ab: <0.1 s/co ratio (ref 0.0–0.9)

## 2019-07-04 IMAGING — CR DG CHEST 2V
2 series · 2 of 2 positions shown · non-contrast
Comparison: 01/25/2007 and prior radiographs

CLINICAL DATA: Acute chest pain today.

EXAM:
CHEST - 2 VIEW

[chest pa]
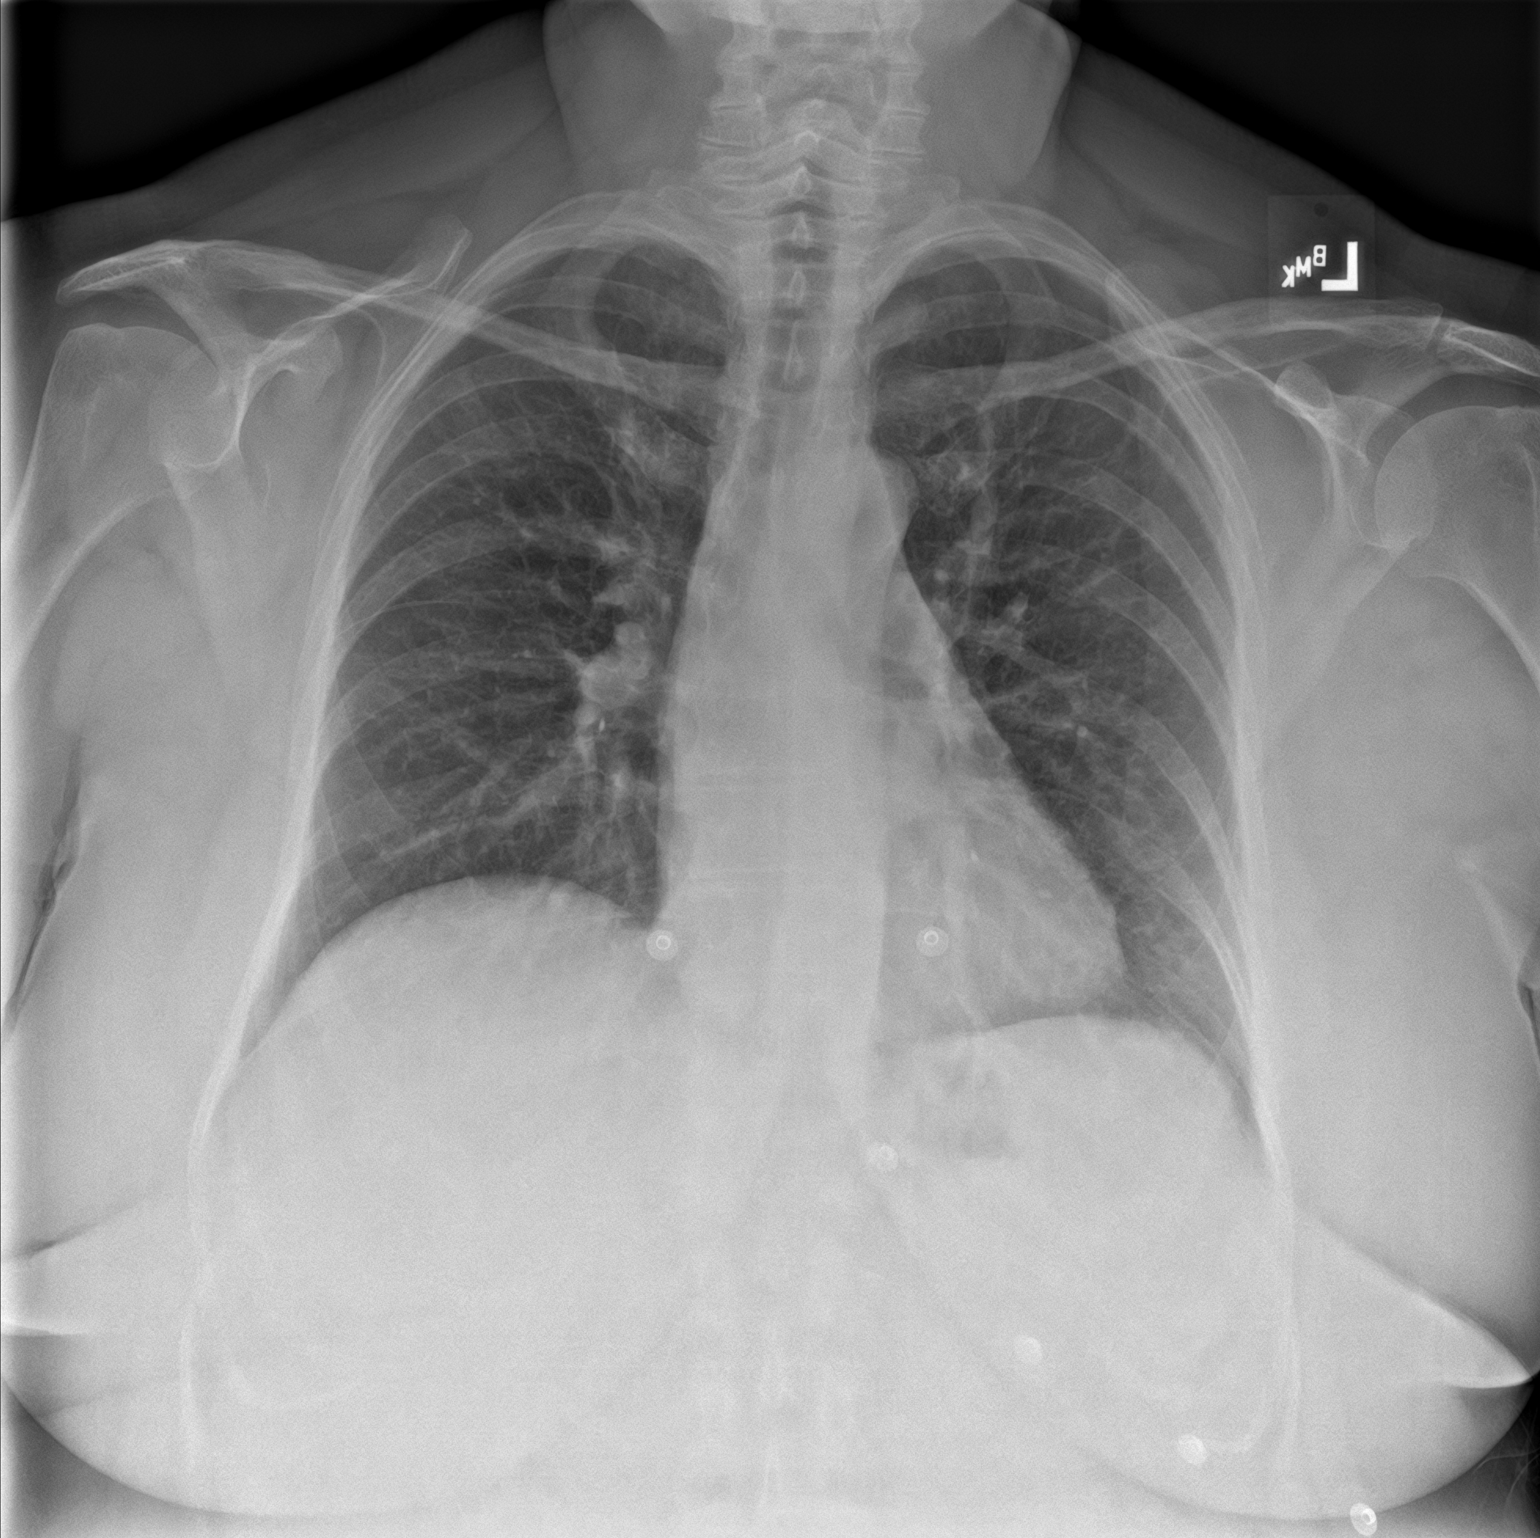

[chest lat]
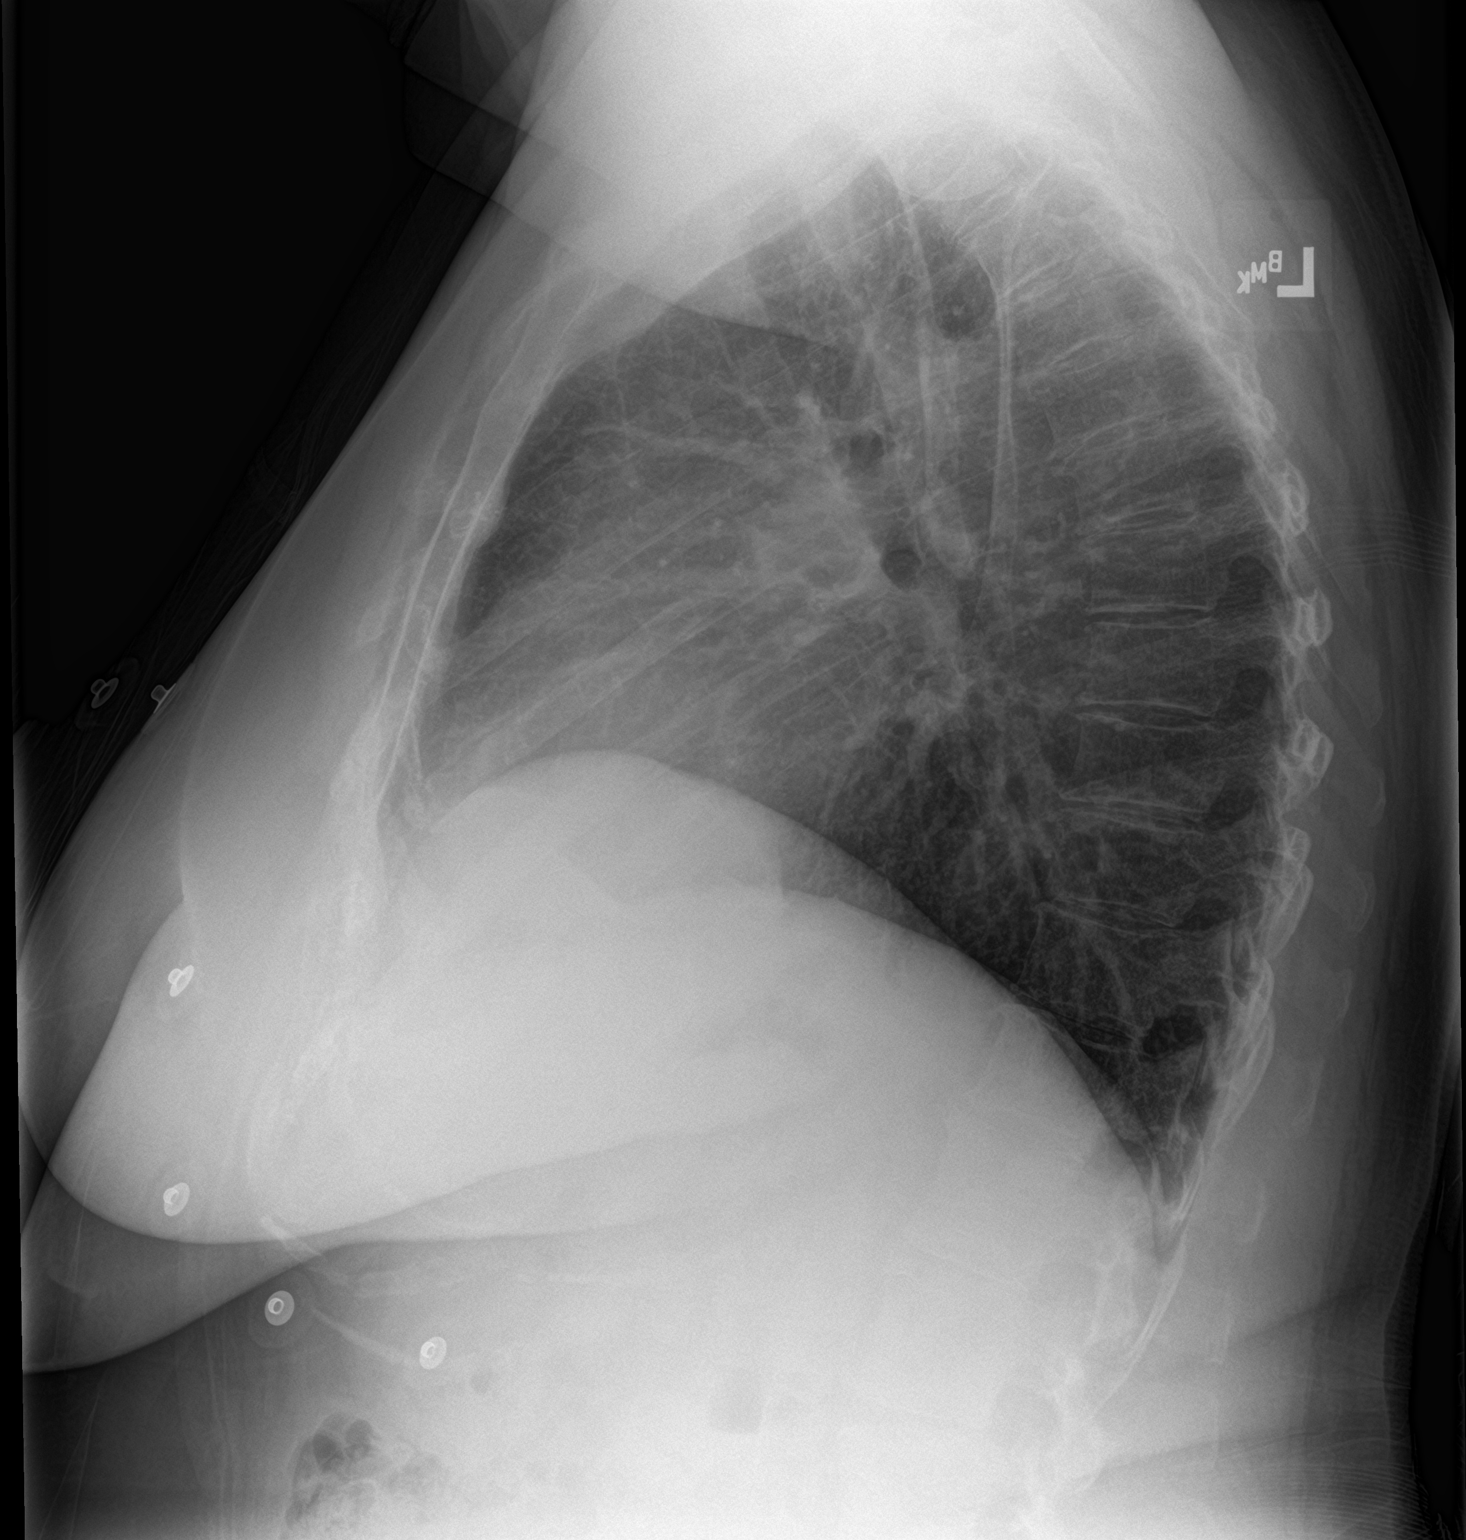

[2 of 2 positions shown; findings below may reference images not displayed]

FINDINGS: The cardiomediastinal silhouette is unremarkable.

Mild elevation of the RIGHT hemidiaphragm again noted.

There is no evidence of focal airspace disease, pulmonary edema,
suspicious pulmonary nodule/mass, pleural effusion, or pneumothorax.

No acute bony abnormalities are identified.
IMPRESSION: No active cardiopulmonary disease.

## 2019-07-05 IMAGING — US US EXTREM LOW VENOUS BILAT
1 series · 13 of 24 positions shown · non-contrast
Comparison: None.

CLINICAL DATA: 61-year-old female with a history of edema



[Series 1: us extrem low venous bilat · 13 of 60 slices shown]
[im 1/60]
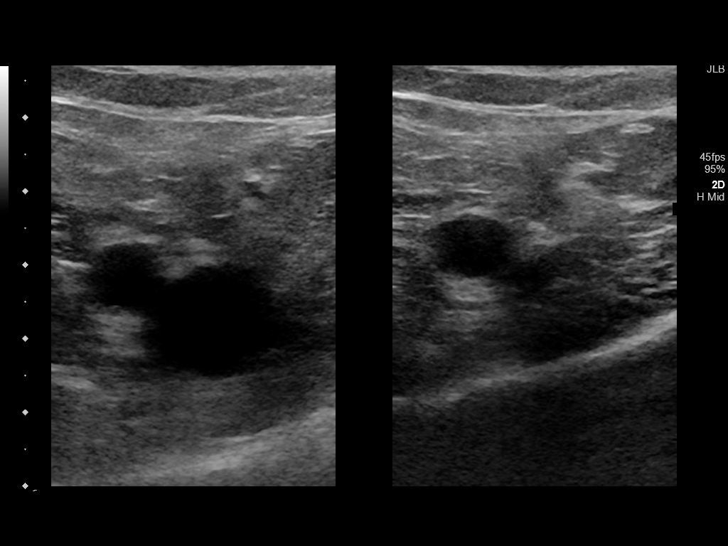
[im 6/60]
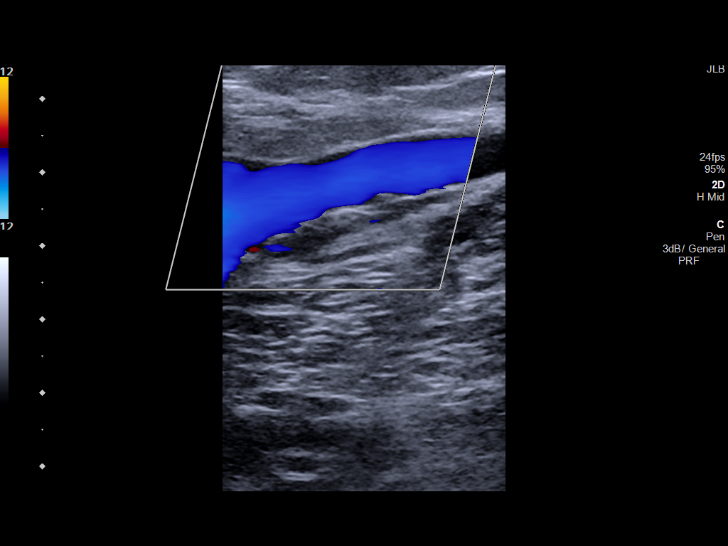
[im 11/60]
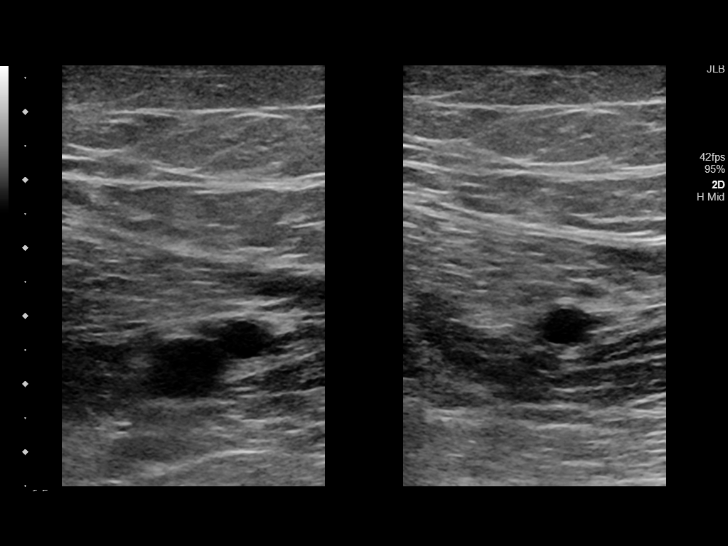
[im 16/60]
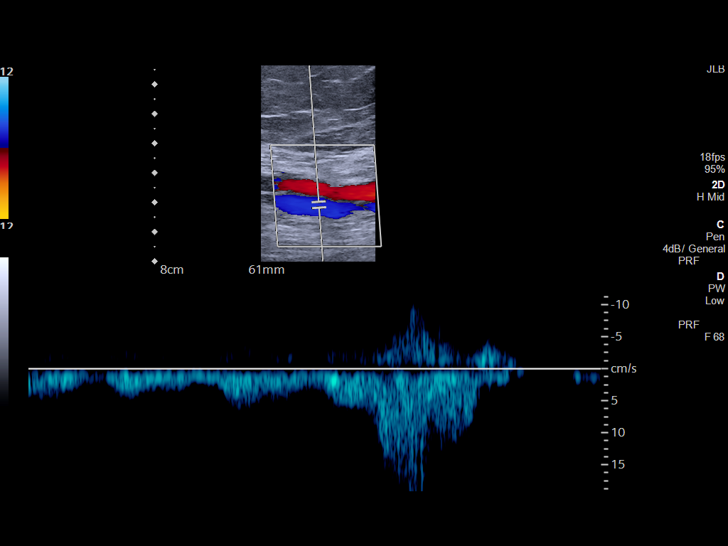
[im 21/60]
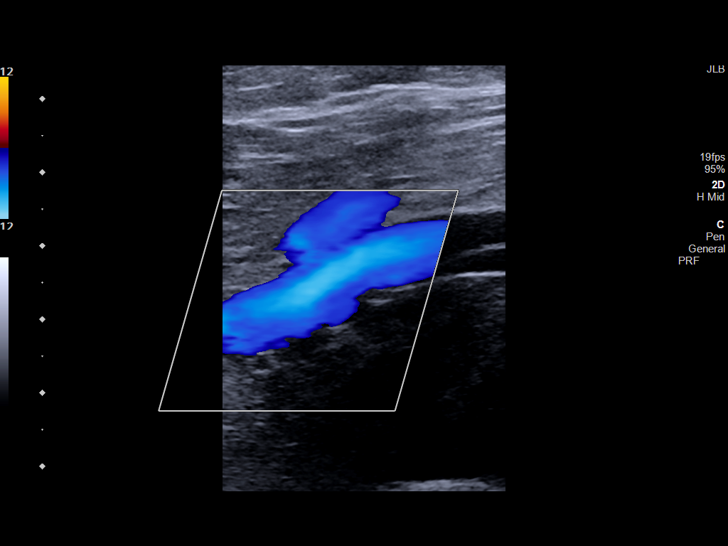
[im 26/60]
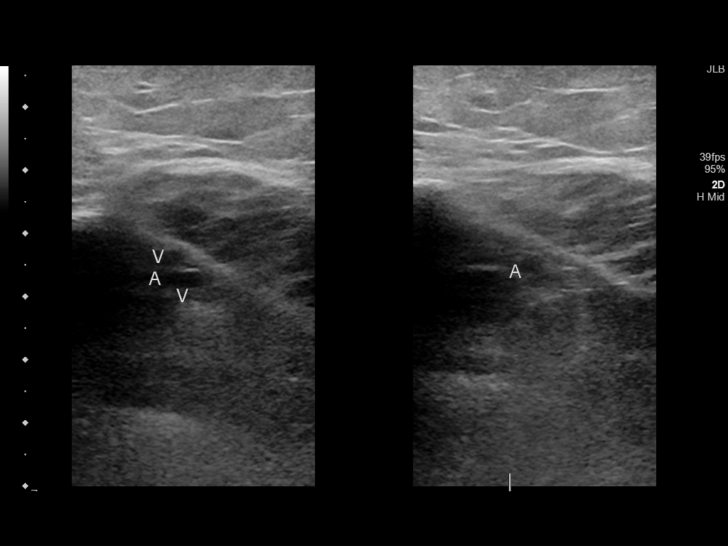
[im 31/60]
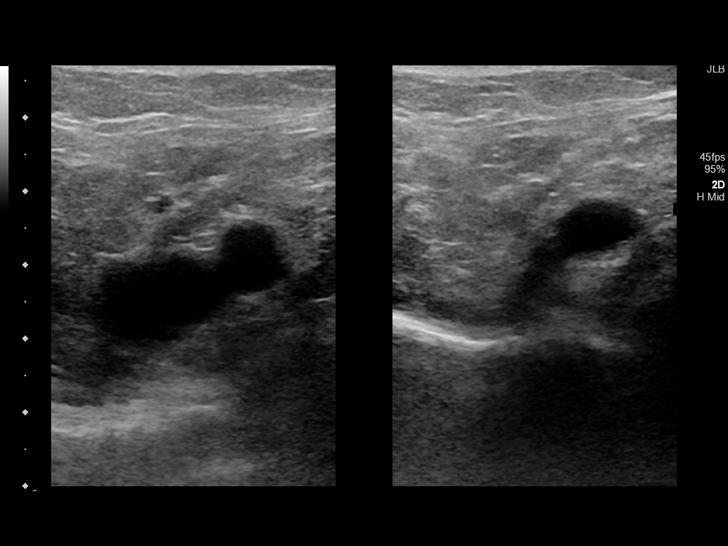
[im 34/60]
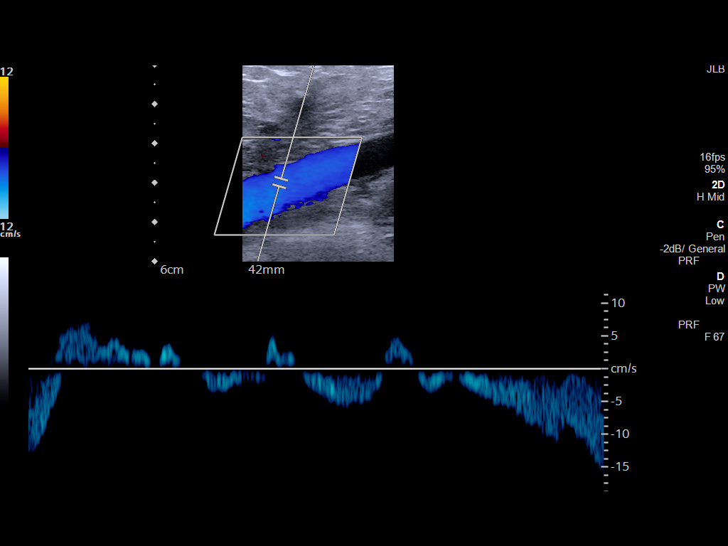
[im 39/60]
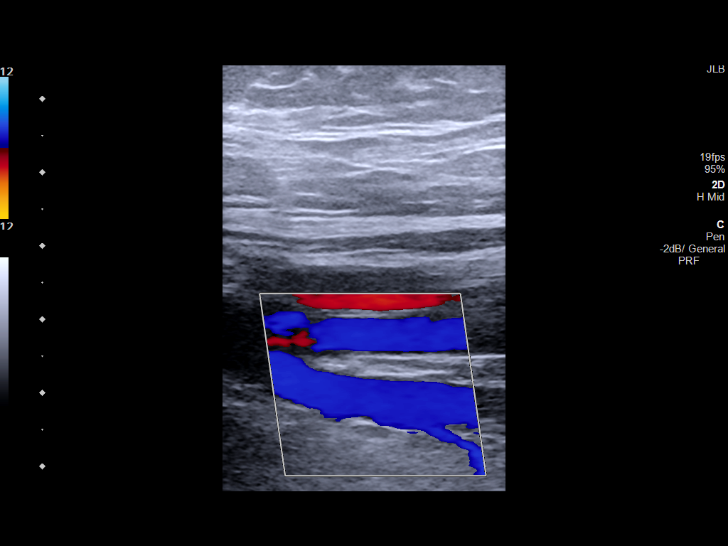
[im 44/60]
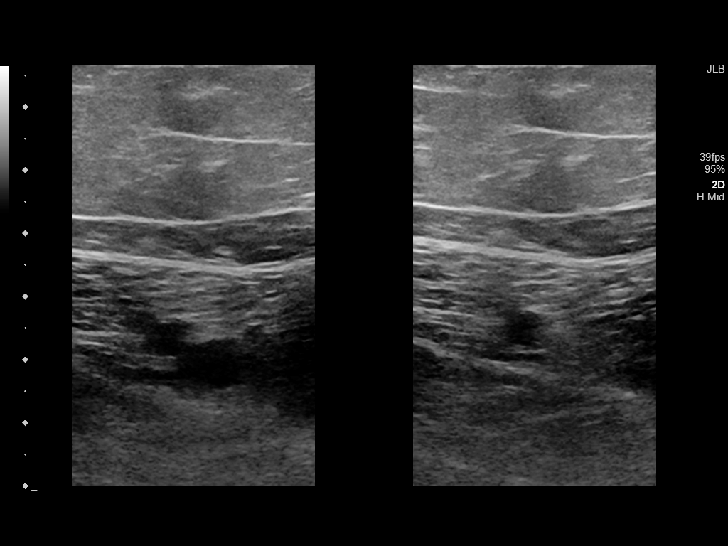
[im 49/60]
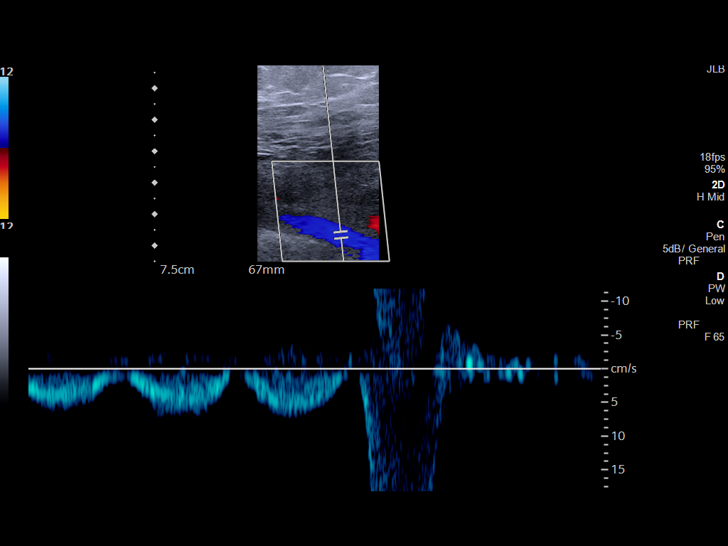
[im 54/60]
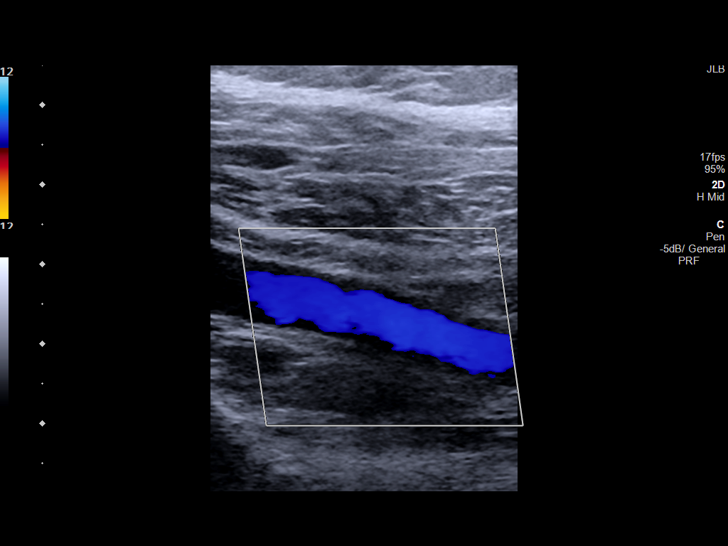
[im 60/60]
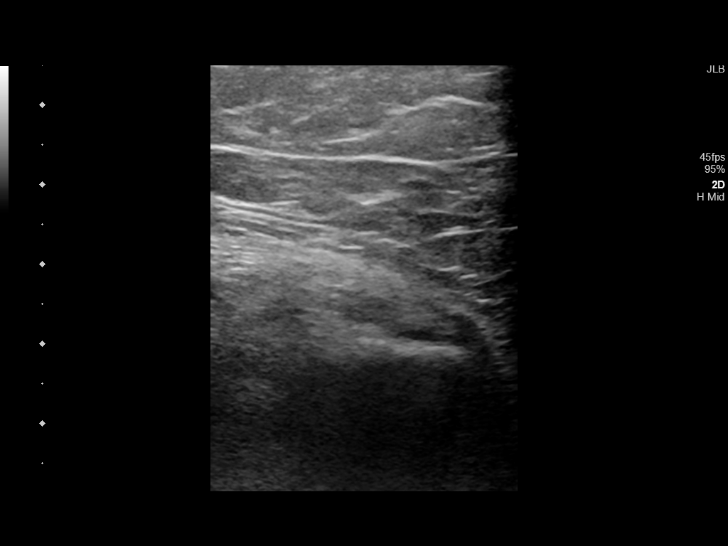

[13 of 24 positions shown; findings below may reference images not displayed]

FINDINGS: RIGHT LOWER EXTREMITY

Common Femoral Vein: No evidence of thrombus. Normal
compressibility, respiratory phasicity and response to augmentation.

Saphenofemoral Junction: No evidence of thrombus. Normal
compressibility and flow on color Doppler imaging.

Profunda Femoral Vein: No evidence of thrombus. Normal
compressibility and flow on color Doppler imaging.

Femoral Vein: No evidence of thrombus. Normal compressibility,
respiratory phasicity and response to augmentation.

Popliteal Vein: No evidence of thrombus. Normal compressibility,
respiratory phasicity and response to augmentation.

Calf Veins: No evidence of thrombus. Normal compressibility and flow
on color Doppler imaging.

Superficial Great Saphenous Vein: No evidence of thrombus. Normal
compressibility and flow on color Doppler imaging.

Other Findings:  None.

LEFT LOWER EXTREMITY

Common Femoral Vein: No evidence of thrombus. Normal
compressibility, respiratory phasicity and response to augmentation.

Saphenofemoral Junction: No evidence of thrombus. Normal
compressibility and flow on color Doppler imaging.

Profunda Femoral Vein: No evidence of thrombus. Normal
compressibility and flow on color Doppler imaging.

Femoral Vein: No evidence of thrombus. Normal compressibility,
respiratory phasicity and response to augmentation.

Popliteal Vein: No evidence of thrombus. Normal compressibility,
respiratory phasicity and response to augmentation.

Calf Veins: No evidence of thrombus. Normal compressibility and flow
on color Doppler imaging.

Superficial Great Saphenous Vein: No evidence of thrombus. Normal
compressibility and flow on color Doppler imaging.

Other Findings:  None.
IMPRESSION: Sonographic survey of the bilateral lower extremities negative for
DVT

## 2019-07-07 ENCOUNTER — Ambulatory Visit
Admission: RE | Admit: 2019-07-07 | Discharge: 2019-07-07 | Disposition: A | Discharge status: Home / Self Care | Payer: Medicare HMO | Source: Ambulatory Visit | Attending: Physician Assistant | Admitting: Physician Assistant

## 2019-07-07 ENCOUNTER — Telehealth: Payer: Self-pay | Admitting: Obstetrics & Gynecology

## 2019-07-07 ENCOUNTER — Telehealth: Payer: Self-pay

## 2019-07-07 DIAGNOSIS — N644 Mastodynia: Secondary | ICD-10-CM | POA: Diagnosis not present

## 2019-07-07 DIAGNOSIS — N6489 Other specified disorders of breast: Secondary | ICD-10-CM | POA: Diagnosis not present

## 2019-07-07 DIAGNOSIS — R928 Other abnormal and inconclusive findings on diagnostic imaging of breast: Secondary | ICD-10-CM | POA: Diagnosis not present

## 2019-07-07 NOTE — Telephone Encounter (Signed)
[facility] referring for h/o Malignant neoplasm of endometrium ([place] and establish care. Called and left voicemail for patient to call back to be schedule

## 2019-07-07 NOTE — Telephone Encounter (Signed)
-----   Message from Mar Daring, PA-C sent at 07/07/2019  7:40 AM EDT ----- Blood count is normal. Kidney function is normal. Liver enzymes are stable. Sodium, potassium and calcium are normal. Cholesterol has improved compared to last year, but triglycerides are still elevated. This is part of cholesterol most closely related to dietary habits. A1c up slightly from 6.9 to 7.1. Continue to work on healthy lifestyle modifications with diet and exercise. CA 125 (cancer marker) is normal. Thyroid is normal. Hep C antibody is negative.

## 2019-07-07 NOTE — Telephone Encounter (Signed)
LMTCB 07/07/2019.  PEC please advise pt of lab results below.    Thanks,   -Mickel Baas

## 2019-07-08 ENCOUNTER — Ambulatory Visit (INDEPENDENT_AMBULATORY_CARE_PROVIDER_SITE_OTHER): Payer: Medicare HMO | Admitting: Urology

## 2019-07-08 ENCOUNTER — Other Ambulatory Visit: Payer: Self-pay

## 2019-07-08 ENCOUNTER — Encounter: Payer: Self-pay | Admitting: Urology

## 2019-07-08 VITALS — BP 146/84 | HR 78 | Ht 67.0 in | Wt 240.0 lb

## 2019-07-08 DIAGNOSIS — R339 Retention of urine, unspecified: Secondary | ICD-10-CM | POA: Diagnosis not present

## 2019-07-08 DIAGNOSIS — N390 Urinary tract infection, site not specified: Secondary | ICD-10-CM

## 2019-07-08 NOTE — Telephone Encounter (Signed)
Pt advised.   Thanks,   -Lillieann Pavlich  

## 2019-07-08 NOTE — Progress Notes (Signed)
07/08/19 02:30 PM  Jean Gonzales 12-06-1955 [id number]  Referring provider: Mar Daring, PA-C [place],  [place] [address]  Chief Complaint  Patient presents with  . Follow-up    RUS results    HPI: Jean Gonzales is a 64 yo female with a hx of urinary retention with hydronephrosis who returns today for f/u and discussion regarding RUS results.   Urinary retention She had a total knee replacement in April 2019 and developed right flank pain and a CT showed right hydronephrosis. Her bladder was noted to be distended and a Foley catheter was placed. Her hydronephrosis resolved with bladder drainage and this had previously occurred in 2018. RUS in 08/2017 noted no hydronephrosis.  She is self cathing once daily at this time and getting residuals of 500 cc or more.  There was also incident where she believes she may have forgot to cath the entire day and had residual of 1000 cc.  The pt is currently self-cathing 3x daily. She reports improvements with an increase in urination frequency, a decrease in residual volume to 500cc or sometimes 400cc., and yellow, clear urine while self-cathing. She states that she drinks about 10 bottle of water a day.  RUS 07/02/2019 NED  rUTI's Risk factors:  Age, vaginal atrophy and incomplete bladder emptying Today the pt complains of rUTI's. She reports of becoming immune to antibiotics every time she is switched to new antibiotics or when her doses are strengthened. She denies improvement in UTI symptoms.  She reports of dysuria, mid back pain which radiates to sides, cloudy urine with odor, and possibly hints of hematuria. Pt had reported of self-cathing once a day.  Pt states that her most recent culture x3 were positive, she has been taking antibiotics since 01/2019.  Pt denies any F/C/N/V but she does urinate more often.  She drinks about 8 bottles of water per day  + urine culture for Klebsiella pneumoniae  resistant to ampicillin and nitrofurantoin 11/15/2018 + urine culture for Klebsiella pneumoniae resistant to ampicillin nitrofurantoin 02/19/2019 + urine culture for Enterobacter asburiae resistant to amoxicillin/clavulanic acid, cefazolin, cefuroxime and nitrofurantoin  CATH UA 06/12/2019 yellow cloudy, specific area 1.020, pH 6.5, nitrite positive, trace leukocyte, 11-30 WBCs, 0-10 epithelial cells and many bacteria.  PVR is 350 cc.   Urine culture positive for Klebsiella aerogenes.     PMH: Past Medical History:  Diagnosis Date  . Ankylosing spondylitis ([place])   . Arthritis    osteoarthritis right knee  . Cancer [facility]) 2014   endometrial CA  . Diabetes mellitus without complication (HCC)    diet controlled  . Endometrial cancer ([place])   . GERD (gastroesophageal reflux disease)   . Hyperlipidemia   . Hypertension    improved after ca tx    Surgical History: Past Surgical History:  Procedure Laterality Date  . ABDOMINAL HYSTERECTOMY  2015   cancer  . BREAST BIOPSY Right 2008ish   core  . CATARACT EXTRACTION W/PHACO Right 02/05/2018   Procedure: CATARACT EXTRACTION PHACO AND INTRAOCULAR LENS PLACEMENT ([place]) RIGHT DIABETES IVA TOPICAL;  Surgeon: Eulogio Bear, MD;  Location: [place];  Service: Ophthalmology;  Laterality: Right;  . CATARACT EXTRACTION W/PHACO Left 02/25/2018   Procedure: CATARACT EXTRACTION PHACO AND INTRAOCULAR LENS PLACEMENT ([place]) LEFT DIABETIC;  Surgeon: Eulogio Bear, MD;  Location: [place];  Service: Ophthalmology;  Laterality: Left;  . CHOLECYSTECTOMY  1992  . TONSILLECTOMY  1973  . TONSILLECTOMY    .  TOTAL KNEE ARTHROPLASTY Right 07/09/2017   Procedure: TOTAL KNEE ARTHROPLASTY;  Surgeon: Lovell Sheehan, MD;  Location: [facility];  Service: Orthopedics;  Laterality: Right;    Home Medications:  Allergies as of 07/08/2019      Reactions   Iodinated Diagnostic Agents Anaphylaxis   Bactrim [sulfamethoxazole-trimethoprim]  Itching, Rash      Medication List       Accurate as of July 08, 2019 11:59 PM. If you have any questions, ask your nurse or doctor.        acetaminophen 325 MG tablet Commonly known as: TYLENOL Take 1-2 tablets (325-650 mg total) by mouth every 6 (six) hours as needed for mild pain (pain score 1-3 or temp > 100.5).   aspirin EC 81 MG tablet Take 81 mg by mouth daily.   atorvastatin 40 MG tablet Commonly known as: LIPITOR TAKE 1 TABLET (40 MG) BY MOUTH EVERY DAY IN THE MORNING.   celecoxib 100 MG capsule Commonly known as: CELEBREX TAKE 1 CAPSULE BY MOUTH TWICE A DAY   clindamycin 300 MG capsule Commonly known as: CLEOCIN Take 1 capsule (300 mg total) by mouth 3 (three) times daily.   Curcumin Powd by Does not apply route. With ginger and black seed   diclofenac sodium 1 % Gel Commonly known as: VOLTAREN Apply 2 g topically 4 (four) times daily. Apply to knee   esomeprazole 40 MG capsule Commonly known as: NEXIUM TAKE 1 CAPSULE BY MOUTH EVERY DAY; mylan brand only please   furosemide 20 MG tablet Commonly known as: LASIX TAKE 1 TABLET BY MOUTH TWICE A DAY   hydrochlorothiazide 25 MG tablet Commonly known as: HYDRODIURIL TAKE 1 TABLET BY MOUTH EVERY DAY   nystatin powder Commonly known as: MYCOSTATIN/NYSTOP Apply topically 3 (three) times daily.   tamsulosin 0.4 MG Caps capsule Commonly known as: FLOMAX TAKE 1 CAPSULE BY MOUTH EVERY DAY   triamcinolone ointment 0.1 % Commonly known as: KENALOG APPLY TO AFFECTED AREA TWICE A DAY   zolpidem 10 MG tablet Commonly known as: AMBIEN TAKE 1 TABLET BY MOUTH AT BEDTIME AS NEEDED FOR SLEEP.       Allergies:  Allergies  Allergen Reactions  . Iodinated Diagnostic Agents Anaphylaxis  . Bactrim [Sulfamethoxazole-Trimethoprim] Itching and Rash    Family History: Family History  Problem Relation Age of Onset  . Diabetes Mother   . Cancer Mother   . Heart disease Father   . Breast cancer Neg Hx   .  Kidney cancer Neg Hx   . Bladder Cancer Neg Hx     Social History:  reports that she has never smoked. She has never used smokeless tobacco. She reports that she does not drink alcohol or use drugs.   Physical Exam: BP (!) 146/84   Pulse 78   Ht 5' 7"  (1.702 m)   Wt 240 lb (108.9 kg)   BMI 37.59 kg/m   Constitutional:  Well nourished. Alert and oriented, No acute distress. HEENT: [place] AT, mask in place.  Trachea midline. Cardiovascular: No clubbing, cyanosis, or edema. Respiratory: Normal respiratory effort, no increased work of breathing. Neurologic: Grossly intact, no focal deficits, moving all 4 extremities. Psychiatric: Normal mood and affect.   Laboratory Data: Lab Results  Component Value Date   WBC 8.8 07/03/2019   HGB 13.6 07/03/2019   HCT 40.7 07/03/2019   MCV 89 07/03/2019   PLT 239 07/03/2019    Lab Results  Component Value Date   CREATININE 1.00 07/03/2019  Lab Results  Component Value Date   HGBA1C 7.1 (H) 07/03/2019    Assessment & Plan:    1. Incomplete bladder emptying/retention Patient requires an increase of CIC to 3 times per day due to permanent urinary retention RUS negative for hydronephrosis and other nidus for infections RTC in one month for PVR and symptom recheck  2. rUTI's Criteria for recurrent UTI has been met with 2 or more infections in 6 months or 3 or greater infections in one year - though it is likely UTI symptoms were caused by large residuals, will continue to monitor Patient is instructed to increase their water intake until the urine is pale yellow or clear (10 to 12 cups daily)  Patient is instructed to take probiotics (yogurt, oral pills or vaginal suppositories), take cranberry pills or drink the juice and Vitamin C 1,000 mg daily to acidify the urine  Avoid soaking in tubs and wipe front to back after urinating  Contact our office for symptoms of an UTI  Return in about 1 month (around 08/07/2019) for PVR and symptom  recheck .  [name], PA-C  [facility] [address] [place], [place] [address] [phone]  I, Noland Fordyce, am acting as a Education administrator for [employer].  I have reviewed the above documentation for accuracy and completeness, and I agree with the above.    [name], PA-C

## 2019-07-08 NOTE — Telephone Encounter (Signed)
-----   Message from Mar Daring, [place] sent at 07/07/2019  1:42 PM EDT ----- Mammogram and [place] were normal. Repeat annual screenings

## 2019-07-08 NOTE — Telephone Encounter (Signed)
Called and left voice mail for patient to call back to be schedule °

## 2019-07-10 NOTE — Telephone Encounter (Signed)
Called and left voicemail for patient to call back to be scheduled. 

## 2019-07-15 ENCOUNTER — Ambulatory Visit
Admission: RE | Admit: 2019-07-15 | Discharge: 2019-07-15 | Disposition: A | Discharge status: Home / Self Care | Payer: Medicare HMO | Source: Ambulatory Visit | Attending: Physician Assistant | Admitting: Physician Assistant

## 2019-07-15 DIAGNOSIS — Z780 Asymptomatic menopausal state: Secondary | ICD-10-CM | POA: Diagnosis not present

## 2019-07-15 DIAGNOSIS — Z13820 Encounter for screening for osteoporosis: Secondary | ICD-10-CM | POA: Insufficient documentation

## 2019-07-16 ENCOUNTER — Telehealth: Payer: Self-pay

## 2019-07-16 NOTE — Telephone Encounter (Signed)
-----   Message from Mar Daring, [place] sent at 07/16/2019 11:48 AM EDT ----- Bone density is normal. Can repeat in 10 years

## 2019-07-16 NOTE — Telephone Encounter (Signed)
Pt advised.   Thanks,   -Ellarose Brandi  

## 2019-07-17 ENCOUNTER — Other Ambulatory Visit: Payer: Self-pay

## 2019-07-17 ENCOUNTER — Telehealth (INDEPENDENT_AMBULATORY_CARE_PROVIDER_SITE_OTHER): Payer: Self-pay | Admitting: Gastroenterology

## 2019-07-17 DIAGNOSIS — Z1211 Encounter for screening for malignant neoplasm of colon: Secondary | ICD-10-CM

## 2019-07-17 NOTE — Progress Notes (Signed)
Gastroenterology Pre-Procedure Review  Request Date: May 14th  Requesting Physician: Dr. Vicente Males  PATIENT REVIEW QUESTIONS: The patient responded to the following health history questions as indicated:    1. Are you having any GI issues? no 2. Do you have a personal history of Polyps? no 3. Do you have a family history of Colon Cancer or Polyps? no 4. Diabetes Mellitus? no 5. Joint replacements in the past 12 months?no 6. Major health problems in the past 3 months?no 7. Any artificial heart valves, MVP, or defibrillator?no    MEDICATIONS & ALLERGIES:    Patient reports the following regarding taking any anticoagulation/antiplatelet therapy:   Plavix, Coumadin, Eliquis, Xarelto, Lovenox, Pradaxa, Brilinta, or Effient? no Aspirin? no  Patient confirms/reports the following medications:  Current Outpatient Medications  Medication Sig Dispense Refill  . acetaminophen (TYLENOL) 325 MG tablet Take 1-2 tablets (325-650 mg total) by mouth every 6 (six) hours as needed for mild pain (pain score 1-3 or temp > 100.5). 30 tablet 0  . aspirin EC 81 MG tablet Take 81 mg by mouth daily.    Marland Kitchen atorvastatin (LIPITOR) 40 MG tablet TAKE 1 TABLET (40 MG) BY MOUTH EVERY DAY IN THE MORNING. 90 tablet 0  . celecoxib (CELEBREX) 100 MG capsule TAKE 1 CAPSULE BY MOUTH TWICE A DAY 180 capsule 1  . clindamycin (CLEOCIN) 300 MG capsule Take 1 capsule (300 mg total) by mouth 3 (three) times daily. 30 capsule 0  . diclofenac sodium (VOLTAREN) 1 % GEL Apply 2 g topically 4 (four) times daily. Apply to knee 400 g 3  . esomeprazole (NEXIUM) 40 MG capsule TAKE 1 CAPSULE BY MOUTH EVERY DAY; mylan brand only please 90 capsule 1  . furosemide (LASIX) 20 MG tablet TAKE 1 TABLET BY MOUTH TWICE A DAY 180 tablet 1  . hydrochlorothiazide (HYDRODIURIL) 25 MG tablet TAKE 1 TABLET BY MOUTH EVERY DAY 90 tablet 3  . nystatin (MYCOSTATIN/NYSTOP) powder Apply topically 3 (three) times daily. 100 g 5  . tamsulosin (FLOMAX) 0.4 MG CAPS  capsule TAKE 1 CAPSULE BY MOUTH EVERY DAY 90 capsule 3  . triamcinolone ointment (KENALOG) 0.1 % APPLY TO AFFECTED AREA TWICE A DAY 30 g 0  . Turmeric, Curcuma Longa, (CURCUMIN) POWD by Does not apply route. With ginger and black seed    . zolpidem (AMBIEN) 10 MG tablet TAKE 1 TABLET BY MOUTH AT BEDTIME AS NEEDED FOR SLEEP. 90 tablet 1   No current facility-administered medications for this visit.    Patient confirms/reports the following allergies:  Allergies  Allergen Reactions  . Iodinated Diagnostic Agents Anaphylaxis  . Bactrim [Sulfamethoxazole-Trimethoprim] Itching and Rash    No orders of the defined types were placed in this encounter.   AUTHORIZATION INFORMATION Primary Insurance: 1D#: Group #:  Secondary Insurance: 1D#: Group #:  SCHEDULE INFORMATION: Date: May 14th Time: Location:[facility]

## 2019-07-25 ENCOUNTER — Ambulatory Visit: Payer: Self-pay | Admitting: Obstetrics and Gynecology

## 2019-08-04 ENCOUNTER — Other Ambulatory Visit: Payer: Self-pay

## 2019-08-04 ENCOUNTER — Encounter: Payer: Self-pay | Admitting: Obstetrics and Gynecology

## 2019-08-04 ENCOUNTER — Ambulatory Visit (INDEPENDENT_AMBULATORY_CARE_PROVIDER_SITE_OTHER): Payer: Medicare HMO | Admitting: Obstetrics and Gynecology

## 2019-08-04 VITALS — BP 126/84 | Ht 67.0 in | Wt 245.0 lb

## 2019-08-04 DIAGNOSIS — C541 Malignant neoplasm of endometrium: Secondary | ICD-10-CM

## 2019-08-04 NOTE — Progress Notes (Signed)
Obstetrics & Gynecology Office Visit   Chief Complaint  Patient presents with  . Referral  The patient is seen in referral at the request of Rubye Beach from [facility] for ongoing surveillance of endometrial cancer.  History of Present Illness: 64 y.o. menopausal female who is seen in referral at the request of Mar Daring, P* from [facility] for ongoing surveillance of endometrial cancer.  Her cancer history is, briefly, as follows:   Reviewed from [facility] 02/2013. One month history of postmenopausal vaginal bleeding, 10 lb weight loss. Pelvic MRI: abnormally thickened endometrial cavity, measuring nearly 2 cm.   BIOPSY:  endometrium, biopsy  - Endometrioid adenocarcinoma, FIGO grade 2, ER+  SURGERY: 03/2013. Robotic-assisted total laparoscopic hysterectomy, bilateral salpingo-oophorectomy, bilateral pelvic lymphadenectomy  Findings: Uterus enlarged to 10cm, globular. Adhesions of the colon to the left ovary and the left pelvic sidewall. No lymphadenopathy. Tethered small bowel mesentery and extremely redundant colon as well as intraperitoneal adiposity preventing visualization of the aortic lymph nodes.  SURGICAL PATHOLOGY: Uterine endometrioid adenocarcinoma with focal serous carcinoma Endometrioid: FIGO grade 1 (architecture grade 1, nuclear grade 2) Serous: FIGO grade 3  Myometrial invasion: Inner half, 12% Lymphovascular space invasion: not identified  right pelvic, dissection - Twelve lymph nodes, negative for malignancy (0/12)  left pelvic, dissection  - Twenty lymph nodes, negative for malignancy (0/20) AJCC Pathologic Stage: pT1a pN0 pMX  Comment: A 3 mm focal area of the tumor with high grade nuclei and arranged in a papillary configuration, and is confined to the endometrium. This area is morphologically distinct from the surrounding conventional endometrioid adenocarcinoma.  GYN ONCOLOGY  CONFERENCE: Stage 1A endometrioid adenocarcinoma of the uterus with focal serous carcinoma. Discussion with patient regarding close follow-up due to the focal nature of the serous carcinoma versus adjuvant platinum taxane and vaginal brachytherapy.  CHEMOTHERAPY: 09/2013. Completed carboplatin/taxol chemotherapy X 6 courses.  RADIATION THERAPY: 10/2013.Completed vaginal cylinder HDR brachcytherapy X 3 treatments.  Endometrial cancer (CMS-HCC) (Resolved)  03/05/2013 Initial Diagnosis  Endometrial cancer  04/24/2013 Surgery  RA TLH BSO BPLND, (inability to visualize the aortic lymph nodes)  - 10/09/2013 Chemotherapy  s/p 6 cycles of paclitaxel and carboplatin  - 11/10/2013 Radiation  Completed HDR x3   S/p surveillance by gynecologic oncology on 11/15/2018.  Now cancer free since that time.  She states that she initially was diagnosed with FIGO grade 2 endometrioid adenocarcinoma. However, she states that she gave her uterus to [facility] for research and a section of serous adenocarcinoma (small) was noted on further inspection.  This changed the treatment.  She had molecular genetic testing done on the tissue.  She had a recent CA-125, which was negative. Of note, she has urinary retention and has to self catheterize 3 times per day. She is seen a [facility] for this issue.    She is up to date on Mammograms: [place] 1 She has a colonoscopy scheduled for 08/08/19.   DEXA: normal.  She denies any vaginal bleeding.  She denies weight loss.  She has actually had trouble losing weight.  She has multiple skeletal issues.  She denies new headaches.   Past Medical History:  Diagnosis Date  . Ankylosing spondylitis ([place])   . Arthritis    osteoarthritis right knee  . Cancer [facility]) 2014   endometrial CA  . Diabetes mellitus without complication (HCC)    diet controlled  . Endometrial cancer ([place])   . GERD (gastroesophageal reflux disease)   .  Hyperlipidemia   . Hypertension    improved after  ca tx    Past Surgical History:  Procedure Laterality Date  . ABDOMINAL HYSTERECTOMY  2015   cancer  . BREAST BIOPSY Right 2008ish   core  . CATARACT EXTRACTION W/PHACO Right 02/05/2018   Procedure: CATARACT EXTRACTION PHACO AND INTRAOCULAR LENS PLACEMENT ([place]) RIGHT DIABETES IVA TOPICAL;  Surgeon: Eulogio Bear, MD;  Location: [place];  Service: Ophthalmology;  Laterality: Right;  . CATARACT EXTRACTION W/PHACO Left 02/25/2018   Procedure: CATARACT EXTRACTION PHACO AND INTRAOCULAR LENS PLACEMENT ([place]) LEFT DIABETIC;  Surgeon: Eulogio Bear, MD;  Location: [place];  Service: Ophthalmology;  Laterality: Left;  . CHOLECYSTECTOMY  1992  . TONSILLECTOMY  1973  . TONSILLECTOMY    . TOTAL KNEE ARTHROPLASTY Right 07/09/2017   Procedure: TOTAL KNEE ARTHROPLASTY;  Surgeon: Lovell Sheehan, MD;  Location: [facility];  Service: Orthopedics;  Laterality: Right;    Gynecologic History: No LMP recorded. Patient has had a hysterectomy.  Family History  Problem Relation Age of Onset  . Diabetes Mother   . Cancer Mother   . Heart disease Father   . Breast cancer Neg Hx   . Kidney cancer Neg Hx   . Bladder Cancer Neg Hx     Social History   Socioeconomic History  . Marital status: Divorced    Spouse name: Not on file  . Number of children: Not on file  . Years of education: Not on file  . Highest education level: Not on file  Occupational History  . Not on file  Tobacco Use  . Smoking status: Never Smoker  . Smokeless tobacco: Never Used  Substance and Sexual Activity  . Alcohol use: No    Alcohol/week: 0.0 standard drinks  . Drug use: No  . Sexual activity: Not Currently  Other Topics Concern  . Not on file  Social History Narrative  . Not on file   Social Determinants of Health   Financial Resource Strain:   . Difficulty of Paying Living Expenses:   Food Insecurity:   . Worried About Charity fundraiser in the Last Year:   . Arboriculturist in  the Last Year:   Transportation Needs:   . Film/video editor (Medical):   Marland Kitchen Lack of Transportation (Non-Medical):   Physical Activity:   . Days of Exercise per Week:   . Minutes of Exercise per Session:   Stress:   . Feeling of Stress :   Social Connections:   . Frequency of Communication with Friends and Family:   . Frequency of Social Gatherings with Friends and Family:   . Attends Religious Services:   . Active Member of Clubs or Organizations:   . Attends Archivist Meetings:   Marland Kitchen Marital Status:   Intimate Partner Violence:   . Fear of Current or Ex-Partner:   . Emotionally Abused:   Marland Kitchen Physically Abused:   . Sexually Abused:     Allergies  Allergen Reactions  . Iodinated Diagnostic Agents Anaphylaxis  . Bactrim [Sulfamethoxazole-Trimethoprim] Itching and Rash    Prior to Admission medications   Medication Sig Start Date End Date Taking? Authorizing Provider  aspirin EC 81 MG tablet Take 81 mg by mouth daily.    [provider]  atorvastatin (LIPITOR) 40 MG tablet TAKE 1 TABLET (40 MG) BY MOUTH EVERY DAY IN THE MORNING. 05/13/19   Mar Daring, PA-C  celecoxib (CELEBREX) 100 MG capsule  TAKE 1 CAPSULE BY MOUTH TWICE A DAY 01/20/19   Mar Daring, PA-C  diclofenac sodium (VOLTAREN) 1 % GEL Apply 2 g topically 4 (four) times daily. Apply to knee 03/11/18   Mar Daring, PA-C  esomeprazole (NEXIUM) 40 MG capsule TAKE 1 CAPSULE BY MOUTH EVERY DAY; mylan brand only please 02/19/19   Mar Daring, PA-C  nystatin (MYCOSTATIN/NYSTOP) powder Apply topically 3 (three) times daily. 07/03/19   Mar Daring, PA-C  tamsulosin (FLOMAX) 0.4 MG CAPS capsule TAKE 1 CAPSULE BY MOUTH EVERY DAY 11/06/18   Mar Daring, PA-C  triamcinolone ointment (KENALOG) 0.1 % APPLY TO AFFECTED AREA TWICE A DAY 05/14/19   [place], Clearnce Sorrel, PA-C  Turmeric, Curcuma [place], (CURCUMIN) POWD by Does not apply route. With ginger and black seed     [provider]  zolpidem (AMBIEN) 10 MG tablet TAKE 1 TABLET BY MOUTH AT BEDTIME AS NEEDED FOR SLEEP. 01/20/19   Mar Daring, PA-C    Review of Systems  Constitutional: Negative.   HENT: Negative.   Eyes: Negative.   Respiratory: Negative.   Cardiovascular: Negative.   Gastrointestinal: Negative.   Genitourinary: Negative.   Musculoskeletal: Negative.   Skin: Negative.   Neurological: Negative.   Psychiatric/Behavioral: Negative.      Physical Exam BP 126/84   Ht 5\' 7"  (1.702 m)   Wt 245 lb (111.1 kg)   BMI 38.37 kg/m  No LMP recorded. Patient has had a hysterectomy. Physical Exam Constitutional:      General: She is not in acute distress.    Appearance: Normal appearance. She is well-developed.  Genitourinary:     Pelvic exam was performed with patient in the lithotomy position.     Vulva, inguinal canal, urethra, bladder, vagina and rectum normal.     No posterior fourchette tenderness, injury or lesion present.     Cervix is absent.     Uterus is absent.     Right adnexa absent.     Left adnexa absent.     Genitourinary Comments: No lesions noted at vaginal cuff.  Rectovaginal exam is confirmatory.   HENT:     Head: Normocephalic and atraumatic.  Eyes:     General: No scleral icterus.    Conjunctiva/sclera: Conjunctivae normal.  Cardiovascular:     Rate and Rhythm: Normal rate and regular rhythm.     Heart sounds: No murmur. No friction rub. No gallop.   Pulmonary:     Effort: Pulmonary effort is normal. No respiratory distress.     Breath sounds: Normal breath sounds. No wheezing or rales.  Abdominal:     General: Bowel sounds are normal. There is no distension.     Palpations: Abdomen is soft. There is no mass.     Tenderness: There is no abdominal tenderness. There is no guarding or rebound.  Musculoskeletal:        General: Normal range of motion.     Cervical back: Normal range of motion and neck supple.  Neurological:     General: No  focal deficit present.     Mental Status: She is alert and oriented to person, place, and time.     Cranial Nerves: No cranial nerve deficit.  Skin:    General: Skin is warm and dry.     Findings: No erythema.  Psychiatric:        Mood and Affect: Mood normal.        Behavior: Behavior normal.  Judgment: Judgment normal.    Female chaperone present for pelvic and breast  portions of the physical exam  Assessment: 64 y.o. No obstetric history on file. female here for  1. Malignant neoplasm of endometrium [facility])      Plan: Problem List Items Addressed This Visit      Genitourinary   Malignant neoplasm of endometrium ([place]) - Primary     No evidence of recurrence noted on today's exam.  Patient reassured.  We will continue to follow along with patient with yearly exams, or as needed.  A total of 35 minutes were spent face-to-face with the patient as well as preparation, review of notes from [facility], communication, and documentation during this encounter.    Prentice Docker, MD 08/04/2019 5:21 PM      CC: Mar Daring, PA-C [address] [place],   [id number]

## 2019-08-06 ENCOUNTER — Other Ambulatory Visit
Admission: RE | Admit: 2019-08-06 | Discharge: 2019-08-06 | Disposition: A | Discharge status: Home / Self Care | Payer: Medicare HMO | Source: Ambulatory Visit | Attending: Gastroenterology | Admitting: Gastroenterology

## 2019-08-06 DIAGNOSIS — Z01812 Encounter for preprocedural laboratory examination: Secondary | ICD-10-CM | POA: Insufficient documentation

## 2019-08-06 DIAGNOSIS — Z20822 Contact with and (suspected) exposure to covid-19: Secondary | ICD-10-CM | POA: Insufficient documentation

## 2019-08-06 LAB — SARS CORONAVIRUS 2 (TAT 6-24 HRS): SARS Coronavirus 2: NEGATIVE

## 2019-08-08 ENCOUNTER — Ambulatory Visit
Admission: RE | Admit: 2019-08-08 | Discharge: 2019-08-08 | Disposition: A | Discharge status: Home / Self Care | Payer: Medicare HMO | Attending: Gastroenterology | Admitting: Gastroenterology

## 2019-08-08 ENCOUNTER — Ambulatory Visit: Payer: Medicare HMO | Admitting: Certified Registered"

## 2019-08-08 ENCOUNTER — Other Ambulatory Visit: Payer: Self-pay

## 2019-08-08 ENCOUNTER — Encounter: Payer: Self-pay | Admitting: Gastroenterology

## 2019-08-08 ENCOUNTER — Encounter
Admission: RE | Disposition: A | Discharge status: Home / Self Care | Payer: Self-pay | Source: Home / Self Care | Attending: Gastroenterology

## 2019-08-08 DIAGNOSIS — K635 Polyp of colon: Secondary | ICD-10-CM

## 2019-08-08 DIAGNOSIS — E785 Hyperlipidemia, unspecified: Secondary | ICD-10-CM | POA: Insufficient documentation

## 2019-08-08 DIAGNOSIS — Z1211 Encounter for screening for malignant neoplasm of colon: Secondary | ICD-10-CM | POA: Diagnosis not present

## 2019-08-08 DIAGNOSIS — I10 Essential (primary) hypertension: Secondary | ICD-10-CM | POA: Insufficient documentation

## 2019-08-08 DIAGNOSIS — K5730 Diverticulosis of large intestine without perforation or abscess without bleeding: Secondary | ICD-10-CM | POA: Diagnosis not present

## 2019-08-08 DIAGNOSIS — Z91041 Radiographic dye allergy status: Secondary | ICD-10-CM | POA: Diagnosis not present

## 2019-08-08 DIAGNOSIS — D124 Benign neoplasm of descending colon: Secondary | ICD-10-CM | POA: Insufficient documentation

## 2019-08-08 DIAGNOSIS — Z96651 Presence of right artificial knee joint: Secondary | ICD-10-CM | POA: Diagnosis not present

## 2019-08-08 DIAGNOSIS — Z79899 Other long term (current) drug therapy: Secondary | ICD-10-CM | POA: Insufficient documentation

## 2019-08-08 DIAGNOSIS — Z7982 Long term (current) use of aspirin: Secondary | ICD-10-CM | POA: Diagnosis not present

## 2019-08-08 DIAGNOSIS — Z9049 Acquired absence of other specified parts of digestive tract: Secondary | ICD-10-CM | POA: Insufficient documentation

## 2019-08-08 DIAGNOSIS — M1711 Unilateral primary osteoarthritis, right knee: Secondary | ICD-10-CM | POA: Diagnosis not present

## 2019-08-08 DIAGNOSIS — Z881 Allergy status to other antibiotic agents status: Secondary | ICD-10-CM | POA: Diagnosis not present

## 2019-08-08 DIAGNOSIS — Z833 Family history of diabetes mellitus: Secondary | ICD-10-CM | POA: Insufficient documentation

## 2019-08-08 DIAGNOSIS — Z791 Long term (current) use of non-steroidal anti-inflammatories (NSAID): Secondary | ICD-10-CM | POA: Diagnosis not present

## 2019-08-08 DIAGNOSIS — K219 Gastro-esophageal reflux disease without esophagitis: Secondary | ICD-10-CM | POA: Insufficient documentation

## 2019-08-08 DIAGNOSIS — Z8249 Family history of ischemic heart disease and other diseases of the circulatory system: Secondary | ICD-10-CM | POA: Diagnosis not present

## 2019-08-08 DIAGNOSIS — K5790 Diverticulosis of intestine, part unspecified, without perforation or abscess without bleeding: Secondary | ICD-10-CM | POA: Diagnosis not present

## 2019-08-08 DIAGNOSIS — E119 Type 2 diabetes mellitus without complications: Secondary | ICD-10-CM | POA: Diagnosis not present

## 2019-08-08 HISTORY — PX: COLONOSCOPY WITH PROPOFOL: SHX5780

## 2019-08-08 LAB — GLUCOSE, CAPILLARY: Glucose-Capillary: 102 mg/dL — ABNORMAL HIGH (ref 70–99)

## 2019-08-08 SURGERY — COLONOSCOPY WITH PROPOFOL
Anesthesia: General

## 2019-08-08 MED ORDER — PROPOFOL 500 MG/50ML IV EMUL
INTRAVENOUS | Status: DC | PRN
  Administered 2019-08-08: 120 ug/kg/min via INTRAVENOUS

## 2019-08-08 MED ORDER — PROPOFOL 500 MG/50ML IV EMUL
INTRAVENOUS | Status: AC
  Filled 2019-08-08: qty 50

## 2019-08-08 MED ORDER — PROPOFOL 10 MG/ML IV BOLUS
INTRAVENOUS | Status: DC | PRN
  Administered 2019-08-08: 50 mg via INTRAVENOUS

## 2019-08-08 MED ORDER — SODIUM CHLORIDE 0.9 % IV SOLN
INTRAVENOUS | Status: DC
  Administered 2019-08-08: 1000 mL via INTRAVENOUS

## 2019-08-08 NOTE — Anesthesia Preprocedure Evaluation (Signed)
Anesthesia Evaluation  Patient identified by MRN, date of birth, ID band Patient awake    Reviewed: Allergy & Precautions, H&P , NPO status , Patient's Chart, lab work & pertinent test results  History of Anesthesia Complications Negative for: history of anesthetic complications  Airway Mallampati: III  TM Distance: >3 FB Neck ROM: limited    Dental  (+) Chipped, Poor Dentition, Dental Advidsory Given   Pulmonary neg pulmonary ROS, neg shortness of breath,           Cardiovascular Exercise Tolerance: Good hypertension, (-) angina+ DVT  (-) Past MI and (-) DOE (-) dysrhythmias (-) Valvular Problems/Murmurs     Neuro/Psych PSYCHIATRIC DISORDERS Depression negative neurological ROS     GI/Hepatic Neg liver ROS, GERD  Medicated and Controlled,  Endo/Other  diabetes, Type 2  Renal/GU      Musculoskeletal  (+) Arthritis ,   Abdominal   Peds  Hematology negative hematology ROS (+)   Anesthesia Other Findings Past Medical History: No date: Arthritis     Comment:  osteoarthritis right knee 2014: Cancer ([place])     Comment:  endometrial CA No date: Diabetes mellitus without complication (HCC) No date: Hyperlipidemia No date: Hypertension  Past Surgical History: 2015: ABDOMINAL HYSTERECTOMY     Comment:  cancer 2008ish: BREAST BIOPSY; Right     Comment:  core 1992: CHOLECYSTECTOMY 1973: TONSILLECTOMY No date: TONSILLECTOMY  BMI    Body Mass Index:  36.95 kg/m      Reproductive/Obstetrics negative OB ROS                             Anesthesia Physical  Anesthesia Plan  ASA: III  Anesthesia Plan: General   Post-op Pain Management:    Induction: Intravenous  PONV Risk Score and Plan: 3 and Propofol infusion and TIVA  Airway Management Planned: Natural Airway and Nasal Cannula  Additional Equipment:   Intra-op Plan:   Post-operative Plan:   Informed Consent: I have  reviewed the patients History and Physical, chart, labs and discussed the procedure including the risks, benefits and alternatives for the proposed anesthesia with the patient or authorized representative who has indicated his/her understanding and acceptance.     Dental Advisory Given  Plan Discussed with: Anesthesiologist, CRNA and Surgeon  Anesthesia Plan Comments: (Patient reports no bleeding problems and no anticoagulant use.  Plan for spinal with backup GA  Patient consented for risks of anesthesia including but not limited to:  - adverse reactions to medications - risk of bleeding, infection, nerve damage and headache - risk of failed spinal - damage to teeth, lips or other oral mucosa - sore throat or hoarseness - Damage to heart, brain, lungs or loss of life  Patient voiced understanding.)        Anesthesia Quick Evaluation

## 2019-08-08 NOTE — H&P (Signed)
Jonathon Bellows, MD [address] [place], [place], [place], [address] [address], [place], [place], [place], [address] Phone: [phone]  Fax: [phone]  Primary Care Physician:  Mar Daring, PA-C   Pre-Procedure History & Physical: HPI:  Jean Gonzales is a 64 y.o. female is here for an colonoscopy.   Past Medical History:  Diagnosis Date  . Ankylosing spondylitis ([place])   . Arthritis    osteoarthritis right knee  . Cancer [facility]) 2014   endometrial CA  . Diabetes mellitus without complication (HCC)    diet controlled  . Endometrial cancer ([place])   . GERD (gastroesophageal reflux disease)   . Hyperlipidemia   . Hypertension    improved after ca tx    Past Surgical History:  Procedure Laterality Date  . ABDOMINAL HYSTERECTOMY  2015   cancer  . BREAST BIOPSY Right 2008ish   core  . CATARACT EXTRACTION W/PHACO Right 02/05/2018   Procedure: CATARACT EXTRACTION PHACO AND INTRAOCULAR LENS PLACEMENT ([place]) RIGHT DIABETES IVA TOPICAL;  Surgeon: Eulogio Bear, MD;  Location: [facility];  Service: Ophthalmology;  Laterality: Right;  . CATARACT EXTRACTION W/PHACO Left 02/25/2018   Procedure: CATARACT EXTRACTION PHACO AND INTRAOCULAR LENS PLACEMENT ([place]) LEFT DIABETIC;  Surgeon: Eulogio Bear, MD;  Location: [facility];  Service: Ophthalmology;  Laterality: Left;  . CHOLECYSTECTOMY  1992  . EYE SURGERY    . TONSILLECTOMY  1973  . TONSILLECTOMY    . TOTAL KNEE ARTHROPLASTY Right 07/09/2017   Procedure: TOTAL KNEE ARTHROPLASTY;  Surgeon: Lovell Sheehan, MD;  Location: [facility];  Service: Orthopedics;  Laterality: Right;    Prior to Admission medications   Medication Sig Start Date End Date Taking? Authorizing Provider  aspirin EC 81 MG tablet Take 81 mg by mouth daily.   Yes [provider]  atorvastatin (LIPITOR) 40 MG tablet TAKE 1 TABLET (40 MG) BY MOUTH EVERY DAY IN THE MORNING. 05/13/19  Yes Burnette, Clearnce Sorrel, PA-C    celecoxib (CELEBREX) 100 MG capsule TAKE 1 CAPSULE BY MOUTH TWICE A DAY 01/20/19  Yes Fenton Malling M, PA-C  diclofenac sodium (VOLTAREN) 1 % GEL Apply 2 g topically 4 (four) times daily. Apply to knee 03/11/18  Yes Mar Daring, PA-C  esomeprazole (NEXIUM) 40 MG capsule TAKE 1 CAPSULE BY MOUTH EVERY DAY; mylan brand only please 02/19/19  Yes Fenton Malling M, PA-C  nystatin (MYCOSTATIN/NYSTOP) powder Apply topically 3 (three) times daily. 07/03/19  Yes Mar Daring, PA-C  tamsulosin (FLOMAX) 0.4 MG CAPS capsule TAKE 1 CAPSULE BY MOUTH EVERY DAY 11/06/18  Yes Fenton Malling M, PA-C  triamcinolone ointment (KENALOG) 0.1 % APPLY TO AFFECTED AREA TWICE A DAY 05/14/19  Yes Burnette, Clearnce Sorrel, PA-C  Turmeric, Curcuma [place], (CURCUMIN) POWD by Does not apply route. With ginger and black seed   Yes [provider]  zolpidem (AMBIEN) 10 MG tablet TAKE 1 TABLET BY MOUTH AT BEDTIME AS NEEDED FOR SLEEP. 01/20/19  Yes Burnette, Clearnce Sorrel, PA-C  acetaminophen (TYLENOL) 325 MG tablet Take 1-2 tablets (325-650 mg total) by mouth every 6 (six) hours as needed for mild pain (pain score 1-3 or temp > 100.5). Patient not taking: Reported on 07/17/2019 07/12/17   Lovell Sheehan, MD  furosemide (LASIX) 20 MG tablet TAKE 1 TABLET BY MOUTH TWICE A DAY Patient not taking: Reported on 07/17/2019 04/02/18   Mar Daring, PA-C  hydrochlorothiazide (HYDRODIURIL) 25 MG tablet TAKE 1 TABLET BY  MOUTH EVERY DAY Patient not taking: Reported on 07/17/2019 11/06/18   Mar Daring, PA-C    Allergies as of 07/17/2019 - Review Complete 07/17/2019  Allergen Reaction Noted  . Iodinated diagnostic agents Anaphylaxis 02/22/2015  . Bactrim [sulfamethoxazole-trimethoprim] Itching and Rash 04/23/2018    Family History  Problem Relation Age of Onset  . Diabetes Mother   . Cancer Mother   . Heart disease Father   . Breast cancer Neg Hx   . Kidney cancer Neg Hx   . Bladder Cancer Neg Hx      Social History   Socioeconomic History  . Marital status: Divorced    Spouse name: Not on file  . Number of children: Not on file  . Years of education: Not on file  . Highest education level: Not on file  Occupational History  . Not on file  Tobacco Use  . Smoking status: Never Smoker  . Smokeless tobacco: Never Used  Substance and Sexual Activity  . Alcohol use: No    Alcohol/week: 0.0 standard drinks  . Drug use: No  . Sexual activity: Not Currently  Other Topics Concern  . Not on file  Social History Narrative  . Not on file   Social Determinants of Health   Financial Resource Strain:   . Difficulty of Paying Living Expenses:   Food Insecurity:   . Worried About Charity fundraiser in the Last Year:   . Arboriculturist in the Last Year:   Transportation Needs:   . Film/video editor (Medical):   Marland Kitchen Lack of Transportation (Non-Medical):   Physical Activity:   . Days of Exercise per Week:   . Minutes of Exercise per Session:   Stress:   . Feeling of Stress :   Social Connections:   . Frequency of Communication with Friends and Family:   . Frequency of Social Gatherings with Friends and Family:   . Attends Religious Services:   . Active Member of Clubs or Organizations:   . Attends Archivist Meetings:   Marland Kitchen Marital Status:   Intimate Partner Violence:   . Fear of Current or Ex-Partner:   . Emotionally Abused:   Marland Kitchen Physically Abused:   . Sexually Abused:     Review of Systems: See HPI, otherwise negative ROS  Physical Exam: BP (!) 174/94   Pulse 63   Temp (!) 97.3 F (36.3 C) (Temporal)   Resp 18   Ht 5' 7.5" (1.715 m)   Wt 109.1 kg   SpO2 100%   BMI 37.11 kg/m  General:   Alert,  pleasant and cooperative in NAD Head:  Normocephalic and atraumatic. Neck:  Supple; no masses or thyromegaly. Lungs:  Clear throughout to auscultation, normal respiratory effort.    Heart:  +S1, +S2, Regular rate and rhythm, No edema. Abdomen:  Soft,  nontender and nondistended. Normal bowel sounds, without guarding, and without rebound.   Neurologic:  Alert and  oriented x4;  grossly normal neurologically.  Impression/Plan: Jean Gonzales is here for an colonoscopy to be performed for Screening colonoscopy average risk   Risks, benefits, limitations, and alternatives regarding  colonoscopy have been reviewed with the patient.  Questions have been answered.  All parties agreeable.   Jonathon Bellows, MD  08/08/2019, 8:28 AM

## 2019-08-08 NOTE — Op Note (Addendum)
[facility] Gastroenterology Patient Name: Alexei Hetzel Procedure Date: 08/08/2019 8:26 AM MRN: [id number] Account #: [id number] Date of Birth: May 18, 1955 Admit Type: Outpatient Age: 64 Room: [facility] ENDO ROOM 2 Gender: Female Note Status: Finalized Procedure:             Colonoscopy Indications:           Screening for colorectal malignant neoplasm Providers:             Jonathon Bellows MD, MD Medicines:             Monitored Anesthesia Care Complications:         No immediate complications. Procedure:             Pre-Anesthesia Assessment:                        - Prior to the procedure, a History and Physical was                         performed, and patient medications, allergies and                         sensitivities were reviewed. The patient's tolerance                         of previous anesthesia was reviewed.                        - The risks and benefits of the procedure and the                         sedation options and risks were discussed with the                         patient. All questions were answered and informed                         consent was obtained.                        - ASA Grade Assessment: II - A patient with mild                         systemic disease.                        After obtaining informed consent, the colonoscope was                         passed under direct vision. Throughout the procedure,                         the patient's blood pressure, pulse, and oxygen                         saturations were monitored continuously. The                         Colonoscope was introduced through the anus and  advanced to the the cecum, identified by the                         appendiceal orifice. The colonoscopy was performed                         with ease. The patient tolerated the procedure well.                         The quality of the bowel preparation was excellent. Findings:  The perianal and digital rectal examinations were normal.      A 3 mm polyp was found in the descending colon. The polyp was sessile.       The polyp was removed with a cold biopsy forceps. Resection and       retrieval were complete.      Multiple small-mouthed diverticula were found in the sigmoid colon.      The exam was otherwise without abnormality on direct and retroflexion       views. Impression:            - One 3 mm polyp in the descending colon, removed with                         a cold biopsy forceps. Resected and retrieved.                        - Diverticulosis in the sigmoid colon.                        - The examination was otherwise normal on direct and                         retroflexion views. Recommendation:        - Discharge patient to home (with escort).                        - Resume previous diet.                        - Continue present medications.                        - Await pathology results.                        - Repeat colonoscopy for surveillance based on                         pathology results. Procedure Code(s):     --- Professional ---                        [phone], Colonoscopy, flexible; with biopsy, single or                         multiple Diagnosis Code(s):     --- Professional ---                        Z12.11, Encounter for screening for malignant neoplasm  of colon                        K63.5, Polyp of colon                        K57.30, Diverticulosis of large intestine without                         perforation or abscess without bleeding CPT copyright 2019 American Medical Association. All rights reserved. The codes documented in this report are preliminary and upon coder review may  be revised to meet current compliance requirements. Jonathon Bellows, MD Jonathon Bellows MD, MD 08/08/2019 8:50:48 AM This report has been signed electronically. Number of Addenda: 0 Note Initiated On: 08/08/2019 8:26 AM Scope  Withdrawal Time: 0 hours 9 minutes 25 seconds  Total Procedure Duration: 0 hours 11 minutes 40 seconds  Estimated Blood Loss:  Estimated blood loss: none.      [facility]

## 2019-08-08 NOTE — Anesthesia Postprocedure Evaluation (Signed)
Anesthesia Post Note  Patient: Jean Gonzales  Procedure(s) Performed: COLONOSCOPY WITH PROPOFOL (N/A )  Patient location during evaluation: Endoscopy Anesthesia Type: General Level of consciousness: awake and alert Pain management: pain level controlled Vital Signs Assessment: post-procedure vital signs reviewed and stable Respiratory status: spontaneous breathing, nonlabored ventilation, respiratory function stable and patient connected to nasal cannula oxygen Cardiovascular status: blood pressure returned to baseline and stable Postop Assessment: no apparent nausea or vomiting Anesthetic complications: no     Last Vitals:  Vitals:   08/08/19 0903 08/08/19 0913  BP: 135/77 133/87  Pulse: 62 (!) 55  Resp: (!) 21 17  Temp:    SpO2: 100% 97%    Last Pain:  Vitals:   08/08/19 0913  TempSrc:   PainSc: 0-No pain                 Martha Clan

## 2019-08-08 NOTE — Transfer of Care (Signed)
Immediate Anesthesia Transfer of Care Note  Patient: Jean Gonzales  Procedure(s) Performed: COLONOSCOPY WITH PROPOFOL (N/A )  Patient Location: PACU and Endoscopy Unit  Anesthesia Type:General  Level of Consciousness: sedated  Airway & Oxygen Therapy: Patient Spontanous Breathing  Post-op Assessment: Report given to RN  Post vital signs: stable  Last Vitals:  Vitals Value Taken Time  BP    Temp    Pulse    Resp    SpO2      Last Pain:  Vitals:   08/08/19 0758  TempSrc: Temporal  PainSc: 0-No pain         Complications: No apparent anesthesia complications

## 2019-08-09 ENCOUNTER — Other Ambulatory Visit: Payer: Self-pay | Admitting: Physician Assistant

## 2019-08-09 DIAGNOSIS — E7800 Pure hypercholesterolemia, unspecified: Secondary | ICD-10-CM

## 2019-08-09 NOTE — Telephone Encounter (Signed)
Requested Prescriptions  Pending Prescriptions Disp Refills  . atorvastatin (LIPITOR) 40 MG tablet [Pharmacy Med Name: ATORVASTATIN 40 MG TABLET] 90 tablet 3    Sig: TAKE 1 TABLET BY MOUTH EVERY DAY IN THE MORNING     Cardiovascular:  Antilipid - Statins Failed - 08/09/2019 10:13 AM      Failed - LDL in normal range and within 360 days    LDL Chol Calc (NIH)  Date Value Ref Range Status  07/03/2019 90 0 - 99 mg/dL Final         Failed - HDL in normal range and within 360 days    HDL  Date Value Ref Range Status  07/03/2019 38 (L) >39 mg/dL Final         Failed - Triglycerides in normal range and within 360 days    Triglycerides  Date Value Ref Range Status  07/03/2019 242 (H) 0 - 149 mg/dL Final         Passed - Total Cholesterol in normal range and within 360 days    Cholesterol, Total  Date Value Ref Range Status  07/03/2019 169 100 - 199 mg/dL Final         Passed - Patient is not pregnant      Passed - Valid encounter within last 12 months    Recent Outpatient Visits          1 month ago Medicare annual wellness visit, subsequent   [facility], [place], [place]   2 months ago Recurrent UTI   [place], [place], PA-C   5 months ago Recurrent UTI   [place], [place], [place]   8 months ago Recurrent UTI   [place], [place], [place]   1 year ago Type 2 diabetes mellitus with diabetic polyneuropathy, without long-term current use of insulin [facility])   [place], Clearnce Sorrel, PA-C      Future Appointments            In 1 month Burnette, Clearnce Sorrel, PA-C [employer], [place]   In 2 months [place], [place] Payment [facility]

## 2019-08-11 LAB — SURGICAL PATHOLOGY

## 2019-08-18 ENCOUNTER — Encounter: Payer: Self-pay | Admitting: Gastroenterology

## 2019-08-26 ENCOUNTER — Telehealth: Payer: Self-pay

## 2019-08-26 DIAGNOSIS — N3000 Acute cystitis without hematuria: Secondary | ICD-10-CM

## 2019-08-26 NOTE — Telephone Encounter (Signed)
Per patient provider has seen this before and she had antibiotic for this she said she was given Amox and Clindamycin. Reports she doesn't have anybody reports that her urine is the same cloudy and keeps worsening it has been like this for the past week.She is asking if possible can something be send in for her or if you need her to schedule an appointment that she can figure it out.  Pharmacy:[facility] in [place].

## 2019-08-26 NOTE — Telephone Encounter (Signed)
Copied from [place] [id number]. Topic: General - Call Back - No Documentation >> Aug 26, 2019 12:58 PM Jean Gonzales D wrote: Reason for CRM: Patient is requiring a callback due to pain for tooth ache. Patient states urine is cloudy, odor and headache which is caused by infection of the tooth. Patient states she caths each day and symptoms are getting worse

## 2019-08-27 MED ORDER — CEFDINIR 300 MG PO CAPS: 300.0000 mg | ORAL_CAPSULE | Freq: Two times a day (BID) | ORAL | 0 refills | Status: DC

## 2019-08-27 MED ORDER — DOXYCYCLINE HYCLATE 100 MG PO TABS: 100.0000 mg | ORAL_TABLET | Freq: Two times a day (BID) | ORAL | 0 refills | Status: DC

## 2019-08-27 NOTE — Telephone Encounter (Signed)
Pt made aware of medication available at pharmacy, says she does not usually take this and wants a different medication called in. Please advise

## 2019-08-27 NOTE — Telephone Encounter (Signed)
Per patient she does have a dentist but her insurance doesn't cover. Reports that the dentist won't call anything in for her until she comes in for her tooth be pull out. Reports that she is waiting for the insurance to reimburse.She is going to stayed with the Cefdinir. Per patient she didn't request a different medication she only said that she usually don't take this medicine.

## 2019-08-27 NOTE — Telephone Encounter (Signed)
Her last urine culture had a lot of resistance and for me also trying to cover the mouth cefdinir is the best option.   I can change to doxycycline but may not be as effective.

## 2019-08-27 NOTE — Telephone Encounter (Signed)
Does she have a dentist for the tooth? I can treat the UTI and calm the tooth down, but it will keep getting infected if it is bad.  I will send in cefdinir (omnicef)

## 2019-08-27 NOTE — Addendum Note (Signed)
Addended by: Mar Daring on: 08/27/2019 04:10 PM   Modules accepted: Orders

## 2019-09-17 ENCOUNTER — Encounter: Payer: Self-pay | Admitting: Physician Assistant

## 2019-09-19 ENCOUNTER — Telehealth: Payer: Self-pay | Admitting: Physician Assistant

## 2019-09-19 NOTE — Telephone Encounter (Signed)
Patient would like for Josie to return her call regarding an antibiotic that was prescribed by the doctor.  CB# [phone]

## 2019-09-19 NOTE — Telephone Encounter (Signed)
She needs to be seen.  She has already taken 2 weeks of antibiotics for her tooth and UTI.  This is a long course.  She has a lot of resistance on previous urine culture and this will make it worse.  She needs to have UA and urine culture with OV before being Rx'd more antibiotics.  She needs to see a dentist for the tooth.  Just because it is hurting, does not mean it is infected.  Will not consider Rx of pain meds without a visit either

## 2019-09-19 NOTE — Telephone Encounter (Signed)
See pt advise message.   Thanks,   -Mickel Baas

## 2019-09-27 ENCOUNTER — Other Ambulatory Visit: Payer: Self-pay | Admitting: Physician Assistant

## 2019-09-27 DIAGNOSIS — M069 Rheumatoid arthritis, unspecified: Secondary | ICD-10-CM

## 2019-09-27 DIAGNOSIS — L4050 Arthropathic psoriasis, unspecified: Secondary | ICD-10-CM

## 2019-09-27 NOTE — Telephone Encounter (Signed)
Requested Prescriptions  Pending Prescriptions Disp Refills  . celecoxib (CELEBREX) 100 MG capsule [Pharmacy Med Name: CELECOXIB 100 MG CAPSULE] 180 capsule 1    Sig: TAKE 1 CAPSULE BY MOUTH TWICE A DAY     Analgesics:  COX2 Inhibitors Passed - 09/27/2019  9:56 AM      Passed - HGB in normal range and within 360 days    Hemoglobin  Date Value Ref Range Status  07/03/2019 13.6 11.1 - 15.9 g/dL Final         Passed - Cr in normal range and within 360 days    Creatinine, Ser  Date Value Ref Range Status  07/03/2019 1.00 0.57 - 1.00 mg/dL Final   Creatinine, POC  Date Value Ref Range Status  04/11/2017 NA mg/dL Final         Passed - Patient is not pregnant      Passed - Valid encounter within last 12 months    Recent Outpatient Visits          2 months ago Medicare annual wellness visit, subsequent   [facility], [place], [place]   4 months ago Recurrent UTI   [place], [place], [place]   7 months ago Recurrent UTI   [place], [place], [place]   10 months ago Recurrent UTI   [place], [place], [place]   1 year ago Type 2 diabetes mellitus with diabetic polyneuropathy, without long-term current use of insulin [facility])   [place], Clearnce Sorrel, [place]      Future Appointments            In 1 week Burnette, Clearnce Sorrel, PA-C [employer] [employer], [place]   In 2 weeks Ernestine Conrad, [place] [facility]

## 2019-09-30 DIAGNOSIS — Z6837 Body mass index (BMI) 37.0-37.9, adult: Secondary | ICD-10-CM | POA: Diagnosis not present

## 2019-09-30 DIAGNOSIS — Z791 Long term (current) use of non-steroidal anti-inflammatories (NSAID): Secondary | ICD-10-CM | POA: Diagnosis not present

## 2019-09-30 DIAGNOSIS — E785 Hyperlipidemia, unspecified: Secondary | ICD-10-CM | POA: Diagnosis not present

## 2019-09-30 DIAGNOSIS — K219 Gastro-esophageal reflux disease without esophagitis: Secondary | ICD-10-CM | POA: Diagnosis not present

## 2019-09-30 DIAGNOSIS — E119 Type 2 diabetes mellitus without complications: Secondary | ICD-10-CM | POA: Diagnosis not present

## 2019-09-30 DIAGNOSIS — R32 Unspecified urinary incontinence: Secondary | ICD-10-CM | POA: Diagnosis not present

## 2019-09-30 DIAGNOSIS — G8929 Other chronic pain: Secondary | ICD-10-CM | POA: Diagnosis not present

## 2019-09-30 DIAGNOSIS — R030 Elevated blood-pressure reading, without diagnosis of hypertension: Secondary | ICD-10-CM | POA: Diagnosis not present

## 2019-09-30 DIAGNOSIS — M1990 Unspecified osteoarthritis, unspecified site: Secondary | ICD-10-CM | POA: Diagnosis not present

## 2019-10-06 ENCOUNTER — Encounter: Payer: Self-pay | Admitting: Physician Assistant

## 2019-10-06 ENCOUNTER — Ambulatory Visit (INDEPENDENT_AMBULATORY_CARE_PROVIDER_SITE_OTHER): Payer: Medicare HMO | Admitting: Physician Assistant

## 2019-10-06 ENCOUNTER — Other Ambulatory Visit: Payer: Self-pay

## 2019-10-06 VITALS — BP 152/96 | HR 56 | Temp 97.1°F | Wt 240.0 lb

## 2019-10-06 DIAGNOSIS — N3000 Acute cystitis without hematuria: Secondary | ICD-10-CM | POA: Diagnosis not present

## 2019-10-06 DIAGNOSIS — K047 Periapical abscess without sinus: Secondary | ICD-10-CM | POA: Diagnosis not present

## 2019-10-06 DIAGNOSIS — E1142 Type 2 diabetes mellitus with diabetic polyneuropathy: Secondary | ICD-10-CM | POA: Diagnosis not present

## 2019-10-06 DIAGNOSIS — G629 Polyneuropathy, unspecified: Secondary | ICD-10-CM

## 2019-10-06 DIAGNOSIS — I10 Essential (primary) hypertension: Secondary | ICD-10-CM

## 2019-10-06 DIAGNOSIS — L4050 Arthropathic psoriasis, unspecified: Secondary | ICD-10-CM

## 2019-10-06 DIAGNOSIS — Z6837 Body mass index (BMI) 37.0-37.9, adult: Secondary | ICD-10-CM

## 2019-10-06 LAB — POCT URINALYSIS DIPSTICK
Bilirubin, UA: NEGATIVE
Blood, UA: NEGATIVE
Glucose, UA: NEGATIVE
Ketones, UA: NEGATIVE
Nitrite, UA: POSITIVE
Protein, UA: NEGATIVE
Spec Grav, UA: 1.01 (ref 1.010–1.025)
Urobilinogen, UA: 0.2 E.U./dL
pH, UA: 5 (ref 5.0–8.0)

## 2019-10-06 MED ORDER — DICLOFENAC SODIUM 1 % EX GEL: 2.0000 g | Freq: Four times a day (QID) | CUTANEOUS | 2 refills | Status: DC

## 2019-10-06 MED ORDER — ACETAMINOPHEN-CODEINE #3 300-30 MG PO TABS: 1.0000 | ORAL_TABLET | Freq: Three times a day (TID) | ORAL | 0 refills | Status: DC | PRN

## 2019-10-06 MED ORDER — CLINDAMYCIN HCL 300 MG PO CAPS
300.0000 mg | ORAL_CAPSULE | Freq: Three times a day (TID) | ORAL | 0 refills | Status: DC
Start: 2019-10-06 — End: 2020-03-22

## 2019-10-06 NOTE — Assessment & Plan Note (Signed)
Discussed importance of healthy weight management Discussed diet and exercise  

## 2019-10-06 NOTE — Assessment & Plan Note (Signed)
Secondary to Chemotherapy treatment. Pt has tried Gabapentin in the past but experienced bad side effects.  Advised pt to try OCT Alpha lipoic acid

## 2019-10-06 NOTE — Progress Notes (Signed)
I,Laura E Walsh,acting as a Education administrator for [employer], PA-C.,have documented all relevant documentation on the behalf of Mar Daring, PA-C,as directed by  Mar Daring, PA-C while in the presence of Mar Daring, [place].   Established patient visit   Patient: Jean Gonzales   DOB: Nov 03, 1955   64 y.o. Female  MRN: [id number] Visit Date: 10/06/2019  Today's healthcare provider: Mar Daring, PA-C   Chief Complaint  Patient presents with  . Urinary Tract Infection  . Dental Pain   Subjective    Dental Pain  This is a new problem. The problem occurs daily. The problem has been unchanged. Associated symptoms include sinus pressure.  Urinary Tract Infection  This is a recurrent problem. The current episode started in the past 7 days. The problem has been gradually worsening. The quality of the pain is described as burning. Associated symptoms include frequency. Pertinent negatives include no flank pain, hematuria or urgency.    Dental pain is a chronic issue. Has had multiple teeth break from chemotherapy. Needs to have teeth pulled but cannot afford. Has been trying to find a dentist that will allow her to have payment plans as she cannot afford a large upfront cost.   Medications: Outpatient Medications Prior to Visit  Medication Sig  . acetaminophen (TYLENOL) 325 MG tablet Take 1-2 tablets (325-650 mg total) by mouth every 6 (six) hours as needed for mild pain (pain score 1-3 or temp > 100.5).  Marland Kitchen aspirin EC 81 MG tablet Take 81 mg by mouth daily.  Marland Kitchen atorvastatin (LIPITOR) 40 MG tablet TAKE 1 TABLET BY MOUTH EVERY DAY IN THE MORNING  . celecoxib (CELEBREX) 100 MG capsule TAKE 1 CAPSULE BY MOUTH TWICE A DAY  . diclofenac sodium (VOLTAREN) 1 % GEL Apply 2 g topically 4 (four) times daily. Apply to knee  . esomeprazole (NEXIUM) 40 MG capsule TAKE 1 CAPSULE BY MOUTH EVERY DAY; mylan brand only please  . furosemide (LASIX) 20 MG tablet TAKE 1  TABLET BY MOUTH TWICE A DAY  . hydrochlorothiazide (HYDRODIURIL) 25 MG tablet TAKE 1 TABLET BY MOUTH EVERY DAY  . nystatin (MYCOSTATIN/NYSTOP) powder Apply topically 3 (three) times daily.  . tamsulosin (FLOMAX) 0.4 MG CAPS capsule TAKE 1 CAPSULE BY MOUTH EVERY DAY  . triamcinolone ointment (KENALOG) 0.1 % APPLY TO AFFECTED AREA TWICE A DAY  . Turmeric, Curcuma Longa, (CURCUMIN) POWD by Does not apply route. With ginger and black seed  . zolpidem (AMBIEN) 10 MG tablet TAKE 1 TABLET BY MOUTH AT BEDTIME AS NEEDED FOR SLEEP.  . [DISCONTINUED] doxycycline (VIBRA-TABS) 100 MG tablet Take 1 tablet (100 mg total) by mouth 2 (two) times daily.   No facility-administered medications prior to visit.    Review of Systems  Constitutional: Negative.   HENT: Positive for dental problem, facial swelling and sinus pressure. Negative for congestion, drooling, ear discharge, ear pain, hearing loss, mouth sores, nosebleeds, postnasal drip, rhinorrhea, sinus pain, sneezing, sore throat, tinnitus, trouble swallowing and voice change.   Respiratory: Negative.   Cardiovascular: Negative.   Gastrointestinal: Negative.   Genitourinary: Positive for difficulty urinating, dysuria and frequency. Negative for decreased urine volume, dyspareunia, flank pain, hematuria, urgency, vaginal bleeding, vaginal discharge and vaginal pain.  Musculoskeletal: Positive for arthralgias, back pain, myalgias, neck pain and neck stiffness. Negative for gait problem and joint swelling.  Neurological: Positive for headaches. Negative for dizziness and light-headedness.      Objective    BP Marland Kitchen)  152/96 (BP Location: Left Arm, Patient Position: Sitting, Cuff Size: Large)   Pulse (!) 56   Temp (!) 97.1 F (36.2 C) (Temporal)   Wt 240 lb (108.9 kg)   SpO2 100%   BMI 37.03 kg/m    Physical Exam Constitutional:      General: She is not in acute distress.    Appearance: Normal appearance. She is obese. She is not ill-appearing.    HENT:     Head: Normocephalic and atraumatic.     Right Ear: Hearing, tympanic membrane, ear canal and external ear normal.     Left Ear: Hearing, tympanic membrane, ear canal and external ear normal.     Nose: Nose normal.     Mouth/Throat:     Mouth: Mucous membranes are moist.     Dentition: Abnormal dentition. Dental tenderness and dental abscesses (left lower) present.     Pharynx: Oropharynx is clear.  Cardiovascular:     Rate and Rhythm: Normal rate and regular rhythm.     Pulses: Normal pulses.     Heart sounds: Normal heart sounds.  Pulmonary:     Effort: Pulmonary effort is normal.     Breath sounds: Normal breath sounds.  Abdominal:     Palpations: Abdomen is soft.     Tenderness: There is no abdominal tenderness.  Musculoskeletal:     Right lower leg: No edema.     Left lower leg: No edema.  Neurological:     Mental Status: She is alert and oriented to person, place, and time. Mental status is at baseline.  Psychiatric:        Mood and Affect: Mood is depressed. Affect is flat.        Behavior: Behavior normal.        Thought Content: Thought content normal.        Judgment: Judgment normal.       Results for orders placed or performed in visit on 10/06/19  POCT urinalysis dipstick  Result Value Ref Range   Color, UA     Clarity, UA     Glucose, UA Negative Negative   Bilirubin, UA Negative    Ketones, UA Negative    Spec Grav, UA 1.010 1.010 - 1.025   Blood, UA Negative    pH, UA 5.0 5.0 - 8.0   Protein, UA Negative Negative   Urobilinogen, UA 0.2 0.2 or 1.0 E.U./dL   Nitrite, UA Positive    Leukocytes, UA Large (3+) (A) Negative   Appearance     Odor      Assessment & Plan     Problem List Items Addressed This Visit      Cardiovascular and Mediastinum   BP (high blood pressure)    BP elevated today, likely secondary to pain.  No changes in medications for now.  Will continue to monitor at home.          Digestive   Tooth abscess     Worsening,  Pt filled out the paper work for [employer] care.  She is on a waiting list but was told it could take up to a year to get in.  RX for Clindamycin to also cover UTI as well. Pt states she is going to continue to look for a dentist that excepts payments.           Endocrine   Diabetes mellitus ([place])   Relevant Orders   POCT UA - Microalbumin     Nervous and  Auditory   Neuropathy    Secondary to Chemotherapy treatment. Pt has tried Gabapentin in the past but experienced bad side effects.  Advised pt to try OCT Alpha lipoic acid          Musculoskeletal and Integument   Psoriatic arthritis (HCC)     Genitourinary   Acute cystitis without hematuria - Primary    Recurrent issue; recent flare started 10/03/2019. Will send for Urine culture.  RX for Clindamycin.       Relevant Orders   POCT urinalysis dipstick (Completed)   CULTURE, URINE COMPREHENSIVE     Other   Class 2 severe obesity due to excess calories with serious comorbidity and body mass index (BMI) of 37.0 to 37.9 in adult [facility])    Discussed importance of healthy weight management Discussed diet and exercise           Return in about 4 weeks (around 11/03/2019).         Rubye Beach  [facility] [phone] (phone) [phone] (fax)  [place]

## 2019-10-06 NOTE — Assessment & Plan Note (Addendum)
Worsening,  Pt filled out the paper work for [employer] care.  She is on a waiting list but was told it could take up to a year to get in.  RX for Clindamycin to also cover UTI as well. Pt states she is going to continue to look for a dentist that excepts payments.

## 2019-10-06 NOTE — Assessment & Plan Note (Signed)
BP elevated today, likely secondary to pain.  No changes in medications for now.  Will continue to monitor at home.

## 2019-10-06 NOTE — Assessment & Plan Note (Addendum)
Recurrent issue; recent flare started 10/03/2019. Will send for Urine culture.  RX for Clindamycin.

## 2019-10-06 NOTE — Patient Instructions (Signed)
Try OCT Alpha lipoic acid to help with Neuropathy pain.      Neuropathic Pain Neuropathic pain is pain caused by damage to the nerves that are responsible for certain sensations in your body (sensory nerves). The pain can be caused by:  Damage to the sensory nerves that send signals to your spinal cord and brain (peripheral nervous system).  Damage to the sensory nerves in your brain or spinal cord (central nervous system). Neuropathic pain can make you more sensitive to pain. Even a minor sensation can feel very painful. This is usually a long-term condition that can be difficult to treat. The type of pain differs from person to person. It may:  Start suddenly (acute), or it may develop slowly and last for a long time (chronic).  Come and go as damaged nerves heal, or it may stay at the same level for years.  Cause emotional distress, loss of sleep, and a lower quality of life. What are the causes? The most common cause of this condition is diabetes. Many other diseases and conditions can also cause neuropathic pain. Causes of neuropathic pain can be classified as:  Toxic. This is caused by medicines and chemicals. The most common cause of toxic neuropathic pain is damage from cancer treatments (chemotherapy).  Metabolic. This can be caused by: ? Diabetes. This is the most common disease that damages the nerves. ? Lack of vitamin B from long-term alcohol abuse.  Traumatic. Any injury that cuts, crushes, or stretches a nerve can cause damage and pain. A common example is feeling pain after losing an arm or leg (phantom limb pain).  Compression-related. If a sensory nerve gets trapped or compressed for a long period of time, the blood supply to the nerve can be cut off.  Vascular. Many blood vessel diseases can cause neuropathic pain by decreasing blood supply and oxygen to nerves.  Autoimmune. This type of pain results from diseases in which the body's defense system (immune system)  mistakenly attacks sensory nerves. Examples of autoimmune diseases that can cause neuropathic pain include lupus and multiple sclerosis.  Infectious. Many types of viral infections can damage sensory nerves and cause pain. Shingles infection is a common cause of this type of pain.  Inherited. Neuropathic pain can be a symptom of many diseases that are passed down through families (genetic). What increases the risk? You are more likely to develop this condition if:  You have diabetes.  You smoke.  You drink too much alcohol.  You are taking certain medicines, including medicines that kill cancer cells (chemotherapy) or that treat immune system disorders. What are the signs or symptoms? The main symptom is pain. Neuropathic pain is often described as:  Burning.  Shock-like.  Stinging.  Hot or cold.  Itching. How is this diagnosed? No single test can diagnose neuropathic pain. It is diagnosed based on:  Physical exam and your symptoms. Your health care provider will ask you about your pain. You may be asked to use a pain scale to describe how bad your pain is.  Tests. These may be done to see if you have a high sensitivity to pain and to help find the cause and location of any sensory nerve damage. They include: ? Nerve conduction studies to test how well nerve signals travel through your sensory nerves (electrodiagnostic testing). ? Stimulating your sensory nerves through electrodes on your skin and measuring the response in your spinal cord and brain (somatosensory evoked potential).  Imaging studies, such as: ? X-rays. ?  CT scan. ? MRI. How is this treated? Treatment for neuropathic pain may change over time. You may need to try different treatment options or a combination of treatments. Some options include:  Treating the underlying cause of the neuropathy, such as diabetes, kidney disease, or vitamin deficiencies.  Stopping medicines that can cause neuropathy, such as  chemotherapy.  Medicine to relieve pain. Medicines may include: ? Prescription or over-the-counter pain medicine. ? Anti-seizure medicine. ? Antidepressant medicines. ? Pain-relieving patches that are applied to painful areas of skin. ? A medicine to numb the area (local anesthetic), which can be injected as a nerve block.  Transcutaneous nerve stimulation. This uses electrical currents to block painful nerve signals. The treatment is painless.  Alternative treatments, such as: ? Acupuncture. ? Meditation. ? Massage. ? Physical therapy. ? Pain management programs. ? Counseling. Follow these instructions at home: Medicines   Take over-the-counter and prescription medicines only as told by your health care provider.  Do not drive or use heavy machinery while taking prescription pain medicine.  If you are taking prescription pain medicine, take actions to prevent or treat constipation. Your health care provider may recommend that you: ? Drink enough fluid to keep your urine pale yellow. ? Eat foods that are high in fiber, such as fresh fruits and vegetables, whole grains, and beans. ? Limit foods that are high in fat and processed sugars, such as fried or sweet foods. ? Take an over-the-counter or prescription medicine for constipation. Lifestyle   Have a good support system at home.  Consider joining a chronic pain support group.  Do not use any products that contain nicotine or tobacco, such as cigarettes and e-cigarettes. If you need help quitting, ask your health care provider.  Do not drink alcohol. General instructions  Learn as much as you can about your condition.  Work closely with all your health care providers to find the treatment plan that works best for you.  Ask your health care provider what activities are safe for you.  Keep all follow-up visits as told by your health care provider. This is important. Contact a health care provider if:  Your pain  treatments are not working.  You are having side effects from your medicines.  You are struggling with tiredness (fatigue), mood changes, depression, or anxiety. Summary  Neuropathic pain is pain caused by damage to the nerves that are responsible for certain sensations in your body (sensory nerves).  Neuropathic pain may come and go as damaged nerves heal, or it may stay at the same level for years.  Neuropathic pain is usually a long-term condition that can be difficult to treat. Consider joining a chronic pain support group. This information is not intended to replace advice given to you by your health care provider. Make sure you discuss any questions you have with your health care provider. Document Revised: 07/04/2018 Document Reviewed: 03/30/2017 Elsevier Patient Education  [place].

## 2019-10-08 ENCOUNTER — Encounter: Payer: Self-pay | Admitting: Physician Assistant

## 2019-10-08 LAB — POCT UA - MICROALBUMIN: Microalbumin Ur, POC: 20 mg/L

## 2019-10-09 ENCOUNTER — Telehealth: Payer: Self-pay

## 2019-10-09 DIAGNOSIS — N3000 Acute cystitis without hematuria: Secondary | ICD-10-CM

## 2019-10-09 LAB — CULTURE, URINE COMPREHENSIVE

## 2019-10-09 NOTE — Telephone Encounter (Signed)
-----   Message from Mar Daring, PA-C sent at 10/09/2019  4:59 PM EDT ----- Urine culture is positive for e.coli. It is sensitive to a medication in similar to clindamycin. Hopefully urine will improve with the clindamycin. If not please let me know and I can add a second antibiotic or can change once the clindamycin is completed.

## 2019-10-09 NOTE — Telephone Encounter (Signed)
LMTCB- if patient calls back of for nurse to give results back

## 2019-10-10 MED ORDER — CEPHALEXIN 500 MG PO CAPS: 500.0000 mg | ORAL_CAPSULE | Freq: Two times a day (BID) | ORAL | 0 refills | Status: DC

## 2019-10-10 NOTE — Telephone Encounter (Signed)
Attempted to call pt.  Left vm to return call to the office for recent lab results.

## 2019-10-10 NOTE — Telephone Encounter (Signed)
Patient returned call-  Patient states the appearance of urine has not changed in 4 days- still dark, cloudy and thick. The smell has improved. ( Patient states her tooth is better) Patient wants to know if she should continue or go ahead and change antibiotic.  Patient states she is so confused about how she keeps getting infection. ( She states her stool is soft normally- could that be cause ( microscopic germs)- she stays clean and is catheterizing 3 times/day)

## 2019-10-10 NOTE — Telephone Encounter (Signed)
Just the act of having to use a catheter itself can put her at risk for infections.   Also the urinary retention predisposes her as well.  Keflex added. Take both clindamycin and keflex.

## 2019-10-13 NOTE — Telephone Encounter (Signed)
LMTCB -if patient calls back ok for [facility] nurse to give message.

## 2019-10-13 NOTE — Telephone Encounter (Signed)
FYI

## 2019-10-13 NOTE — Telephone Encounter (Signed)
Patient returned call andwas read last note by Fenton Malling PA.   She verbalized understanding of all  Information.  She will contact her pharmacy for keflex ordered 10/10/19.

## 2019-10-14 ENCOUNTER — Ambulatory Visit: Payer: Self-pay | Admitting: Urology

## 2019-10-15 DIAGNOSIS — R339 Retention of urine, unspecified: Secondary | ICD-10-CM | POA: Diagnosis not present

## 2019-10-28 ENCOUNTER — Encounter: Payer: Self-pay | Admitting: Physician Assistant

## 2019-10-28 ENCOUNTER — Telehealth: Payer: Self-pay

## 2019-10-28 DIAGNOSIS — R195 Other fecal abnormalities: Secondary | ICD-10-CM

## 2019-10-28 DIAGNOSIS — N3000 Acute cystitis without hematuria: Secondary | ICD-10-CM

## 2019-10-28 MED ORDER — CIPROFLOXACIN HCL 250 MG PO TABS: 250.0000 mg | ORAL_TABLET | Freq: Two times a day (BID) | ORAL | 0 refills | Status: DC

## 2019-10-28 NOTE — Telephone Encounter (Signed)
Copied from [place] [id number]. Topic: General - Inquiry >> Oct 28, 2019  1:36 PM Lennox Solders wrote: Reason for CRM: pt is  just calling to let jenni know she sent her mychart message

## 2019-10-31 NOTE — Addendum Note (Signed)
Addended by: Mar Daring on: 10/31/2019 02:50 PM   Modules accepted: Orders

## 2019-11-05 ENCOUNTER — Telehealth: Payer: Self-pay

## 2019-11-05 NOTE — Telephone Encounter (Signed)
Copied from [place]. Topic: General - Call Back - No Documentation >> Nov 05, 2019  3:45 PM Erick Blinks wrote: Pt called and wants fu on MyChart message she submitted, please advise  Best contact: [phone]

## 2019-11-06 MED ORDER — AMOXICILLIN-POT CLAVULANATE 875-125 MG PO TABS: 1.0000 | ORAL_TABLET | Freq: Two times a day (BID) | ORAL | 0 refills | Status: DC

## 2019-11-06 NOTE — Addendum Note (Signed)
Addended by: Mar Daring on: 11/06/2019 01:01 PM   Modules accepted: Orders

## 2019-11-07 ENCOUNTER — Ambulatory Visit: Payer: Medicare HMO | Admitting: Physician Assistant

## 2019-11-09 ENCOUNTER — Other Ambulatory Visit: Payer: Self-pay | Admitting: Physician Assistant

## 2019-11-09 DIAGNOSIS — N9989 Other postprocedural complications and disorders of genitourinary system: Secondary | ICD-10-CM

## 2019-11-09 DIAGNOSIS — I10 Essential (primary) hypertension: Secondary | ICD-10-CM

## 2019-11-09 NOTE — Telephone Encounter (Signed)
Requested Prescriptions  Pending Prescriptions Disp Refills  . hydrochlorothiazide (HYDRODIURIL) 25 MG tablet [Pharmacy Med Name: HYDROCHLOROTHIAZIDE 25 MG TAB] 90 tablet 1    Sig: TAKE 1 TABLET BY MOUTH EVERY DAY     Cardiovascular: Diuretics - Thiazide Failed - 11/09/2019  9:18 AM      Failed - Last BP in normal range    BP Readings from Last 1 Encounters:  10/06/19 (!) 152/96         Passed - Ca in normal range and within 360 days    Calcium  Date Value Ref Range Status  07/03/2019 9.6 8.7 - 10.3 mg/dL Final         Passed - Cr in normal range and within 360 days    Creatinine, Ser  Date Value Ref Range Status  07/03/2019 1.00 0.57 - 1.00 mg/dL Final   Creatinine, POC  Date Value Ref Range Status  04/11/2017 NA mg/dL Final         Passed - K in normal range and within 360 days    Potassium  Date Value Ref Range Status  07/03/2019 4.1 3.5 - 5.2 mmol/L Final         Passed - Na in normal range and within 360 days    Sodium  Date Value Ref Range Status  07/03/2019 140 134 - 144 mmol/L Final         Passed - Valid encounter within last 6 months    Recent Outpatient Visits          1 month ago Acute cystitis without hematuria   [facility] [place], Clearnce Sorrel, PA-C   4 months ago Medicare annual wellness visit, subsequent   [facility], [place], [place]   5 months ago Recurrent UTI   [place], [place], [place]   8 months ago Recurrent UTI   [facility] Fenton Malling M, [place]   11 months ago Recurrent UTI   [place], Clearnce Sorrel, PA-C      Future Appointments            In 4 days Marlyn Corporal, Clearnce Sorrel, PA-C [employer], [facility]           . tamsulosin (FLOMAX) 0.4 MG CAPS capsule [Pharmacy Med Name: TAMSULOSIN HCL 0.4 MG CAPSULE] 90 capsule 3    Sig: TAKE 1 [place]     Urology: Alpha-Adrenergic Blocker Failed - 11/09/2019   9:18 AM      Failed - Last BP in normal range    BP Readings from Last 1 Encounters:  10/06/19 (!) 152/96         Passed - Valid encounter within last 12 months    Recent Outpatient Visits          1 month ago Acute cystitis without hematuria   [place], [place]   4 months ago Medicare annual wellness visit, subsequent   [place], [place], [place]   5 months ago Recurrent UTI   [place], [place], [place]   8 months ago Recurrent UTI   [facility] Fenton Malling M, [place]   11 months ago Recurrent UTI   [facility], Clearnce Sorrel, [place]      Future Appointments            In 4 days [place], Clearnce Sorrel, PA-C [employer], [place]

## 2019-11-11 NOTE — Progress Notes (Deleted)
{This patient's chart is due for periodic physician review. Please check 'Cosign Required' and forward to your supervising physician.:1}  Established patient visit   Patient: Jean Gonzales   DOB: [birth year]   64 y.o. Female  MRN: [id number] Visit Date: 11/13/2019  Today's healthcare provider: Mar Daring, PA-C   No chief complaint on file.  Subjective    HPI  Diabetes Mellitus Type II, Follow-up  Lab Results  Component Value Date   HGBA1C 7.1 (H) 07/03/2019   HGBA1C 6.9 (H) 04/19/2018   HGBA1C 6.8 (H) 12/18/2017   Wt Readings from Last 3 Encounters:  10/06/19 240 lb (108.9 kg)  08/08/19 240 lb 8 oz (109.1 kg)  08/04/19 245 lb (111.1 kg)   Last seen for diabetes 4 months ago.  Management since then includes Diet Controlled. She reports {excellent/good/fair/poor:19665} compliance with treatment. She {is/is not:21021397} having side effects. {document side effects if present:1} Symptoms: {Yes/No:20286} fatigue {Yes/No:20286} foot ulcerations  {Yes/No:20286} appetite changes {Yes/No:20286} nausea  {Yes/No:20286} paresthesia of the feet  {Yes/No:20286} polydipsia  {Yes/No:20286} polyuria {Yes/No:20286} visual disturbances   {Yes/No:20286} vomiting     Home blood sugar records: {diabetes glucometry results:16657}  Episodes of hypoglycemia? {Yes/No:20286} {enter symptoms and frequency of symptoms if yes:1}   Current insulin regiment: {enter 'none' or type of insulin and number of units taken with each dose of each insulin formulation that the patient is taking:1} Most Recent Eye Exam: *** {Current exercise:16438:::1} {Current diet habits:16563:::1}  Pertinent Labs: Lab Results  Component Value Date   CHOL 169 07/03/2019   HDL 38 (L) 07/03/2019   LDLCALC 90 07/03/2019   TRIG 242 (H) 07/03/2019   CHOLHDL 4.4 07/03/2019   Lab Results  Component Value Date   NA 140 07/03/2019   K 4.1 07/03/2019   CREATININE 1.00 07/03/2019   GFRNONAA 59 (L)  07/03/2019   GFRAA 68 07/03/2019   GLUCOSE 115 (H) 07/03/2019     ---------------------------------------------------------------------------------------------------  {Show patient history (optional):23778::" "}   Medications: Outpatient Medications Prior to Visit  Medication Sig  . acetaminophen (TYLENOL) 325 MG tablet Take 1-2 tablets (325-650 mg total) by mouth every 6 (six) hours as needed for mild pain (pain score 1-3 or temp > 100.5).  Marland Kitchen acetaminophen-codeine (TYLENOL #3) 300-30 MG tablet Take 1 tablet by mouth every 8 (eight) hours as needed for moderate pain or severe pain.  Marland Kitchen amoxicillin-clavulanate (AUGMENTIN) 875-125 MG tablet Take 1 tablet by mouth 2 (two) times daily.  Marland Kitchen aspirin EC 81 MG tablet Take 81 mg by mouth daily.  Marland Kitchen atorvastatin (LIPITOR) 40 MG tablet TAKE 1 TABLET BY MOUTH EVERY DAY IN THE MORNING  . celecoxib (CELEBREX) 100 MG capsule TAKE 1 CAPSULE BY MOUTH TWICE A DAY  . ciprofloxacin (CIPRO) 250 MG tablet Take 1 tablet (250 mg total) by mouth 2 (two) times daily.  . clindamycin (CLEOCIN) 300 MG capsule Take 1 capsule (300 mg total) by mouth 3 (three) times daily.  . diclofenac sodium (VOLTAREN) 1 % GEL Apply 2 g topically 4 (four) times daily. Apply to knee  . diclofenac Sodium (VOLTAREN) 1 % GEL Apply 2 g topically 4 (four) times daily.  Marland Kitchen esomeprazole (NEXIUM) 40 MG capsule TAKE 1 CAPSULE BY MOUTH EVERY DAY; mylan brand only please  . furosemide (LASIX) 20 MG tablet TAKE 1 TABLET BY MOUTH TWICE A DAY  . hydrochlorothiazide (HYDRODIURIL) 25 MG tablet TAKE 1 TABLET BY MOUTH EVERY DAY  . nystatin (MYCOSTATIN/NYSTOP) powder Apply topically 3 (three) times daily.  Marland Kitchen  tamsulosin (FLOMAX) 0.4 MG CAPS capsule TAKE 1 CAPSULE BY MOUTH EVERY DAY  . triamcinolone ointment (KENALOG) 0.1 % APPLY TO AFFECTED AREA TWICE A DAY  . Turmeric, Curcuma Longa, (CURCUMIN) POWD by Does not apply route. With ginger and black seed  . zolpidem (AMBIEN) 10 MG tablet TAKE 1 TABLET BY MOUTH  AT BEDTIME AS NEEDED FOR SLEEP.   No facility-administered medications prior to visit.    Review of Systems  {Heme  Chem  Endocrine  Serology  Results Review (optional):23779::" "}  Objective    There were no vitals taken for this visit. {Show previous vital signs (optional):23777::" "}  Physical Exam  ***  No results found for any visits on 11/13/19.  Assessment & Plan     ***  No follow-ups on file.      {provider attestation***:1}   Rubye Beach  [facility] [phone] (phone) [phone] (fax)  [place]

## 2019-11-13 ENCOUNTER — Ambulatory Visit: Payer: Medicare HMO | Admitting: Physician Assistant

## 2019-11-13 DIAGNOSIS — E1142 Type 2 diabetes mellitus with diabetic polyneuropathy: Secondary | ICD-10-CM

## 2019-11-18 ENCOUNTER — Telehealth: Payer: Self-pay | Admitting: Physician Assistant

## 2019-11-18 NOTE — Addendum Note (Signed)
Addended by: Mar Daring on: 11/18/2019 10:05 AM   Modules accepted: Orders

## 2019-11-18 NOTE — Telephone Encounter (Signed)
Patient's daughter in law came to pick up the stool kit for patient.  She is asking if instructions on how to use the kit can be sent to patient through MyChart asap?  Thanks, [employer]

## 2019-11-19 DIAGNOSIS — R195 Other fecal abnormalities: Secondary | ICD-10-CM | POA: Diagnosis not present

## 2019-11-20 NOTE — Telephone Encounter (Signed)
Per  Jean Gonzales [facility] phlebotomist patient drop off stool samples yesterday and it has been send off.

## 2019-11-24 ENCOUNTER — Ambulatory Visit: Payer: Medicare HMO | Admitting: Physician Assistant

## 2019-11-24 NOTE — Progress Notes (Deleted)
{This patient's chart is due for periodic physician review. Please check 'Cosign Required' and forward to your supervising physician.:1}  Established patient visit   Patient: Jean Gonzales   DOB: [birth year]   64 y.o. Female  MRN: [id number] Visit Date: 11/24/2019  Today's healthcare provider: Mar Daring, PA-C   No chief complaint on file.  Subjective    HPI  Diabetes Mellitus Type II, Follow-up  Lab Results  Component Value Date   HGBA1C 7.1 (H) 07/03/2019   HGBA1C 6.9 (H) 04/19/2018   HGBA1C 6.8 (H) 12/18/2017   Wt Readings from Last 3 Encounters:  10/06/19 240 lb (108.9 kg)  08/08/19 240 lb 8 oz (109.1 kg)  08/04/19 245 lb (111.1 kg)   Last seen for diabetes {1-12:18279} {days/wks/mos/yrs:310907} ago.  Management since then includes Continue to work on healthy lifestyle modifications with diet and exercise.   Symptoms: {Yes/No:20286} fatigue {Yes/No:20286} foot ulcerations  {Yes/No:20286} appetite changes {Yes/No:20286} nausea  {Yes/No:20286} paresthesia of the feet  {Yes/No:20286} polydipsia  {Yes/No:20286} polyuria {Yes/No:20286} visual disturbances   {Yes/No:20286} vomiting     Home blood sugar records: {diabetes glucometry results:16657}  Episodes of hypoglycemia? {Yes/No:20286} {enter symptoms and frequency of symptoms if yes:1}   Current insulin regiment: {enter 'none' or type of insulin and number of units taken with each dose of each insulin formulation that the patient is taking:1} Most Recent Eye Exam: *** {Current exercise:16438:::1} {Current diet habits:16563:::1}  Pertinent Labs: Lab Results  Component Value Date   CHOL 169 07/03/2019   HDL 38 (L) 07/03/2019   LDLCALC 90 07/03/2019   TRIG 242 (H) 07/03/2019   CHOLHDL 4.4 07/03/2019   Lab Results  Component Value Date   NA 140 07/03/2019   K 4.1 07/03/2019   CREATININE 1.00 07/03/2019   GFRNONAA 59 (L) 07/03/2019   GFRAA 68 07/03/2019   GLUCOSE 115 (H) 07/03/2019      --------------------------------------------------------------------------------------------------- Pure hypercholesterolemia: She is currently on Atorvastatin 40 mg.  Essential Hypertension: Stable. {Show patient history (optional):23778::" "}   Medications: Outpatient Medications Prior to Visit  Medication Sig  . acetaminophen (TYLENOL) 325 MG tablet Take 1-2 tablets (325-650 mg total) by mouth every 6 (six) hours as needed for mild pain (pain score 1-3 or temp > 100.5).  Marland Kitchen acetaminophen-codeine (TYLENOL #3) 300-30 MG tablet Take 1 tablet by mouth every 8 (eight) hours as needed for moderate pain or severe pain.  Marland Kitchen amoxicillin-clavulanate (AUGMENTIN) 875-125 MG tablet Take 1 tablet by mouth 2 (two) times daily.  Marland Kitchen aspirin EC 81 MG tablet Take 81 mg by mouth daily.  Marland Kitchen atorvastatin (LIPITOR) 40 MG tablet TAKE 1 TABLET BY MOUTH EVERY DAY IN THE MORNING  . celecoxib (CELEBREX) 100 MG capsule TAKE 1 CAPSULE BY MOUTH TWICE A DAY  . ciprofloxacin (CIPRO) 250 MG tablet Take 1 tablet (250 mg total) by mouth 2 (two) times daily.  . clindamycin (CLEOCIN) 300 MG capsule Take 1 capsule (300 mg total) by mouth 3 (three) times daily.  . diclofenac sodium (VOLTAREN) 1 % GEL Apply 2 g topically 4 (four) times daily. Apply to knee  . diclofenac Sodium (VOLTAREN) 1 % GEL Apply 2 g topically 4 (four) times daily.  Marland Kitchen esomeprazole (NEXIUM) 40 MG capsule TAKE 1 CAPSULE BY MOUTH EVERY DAY; mylan brand only please  . furosemide (LASIX) 20 MG tablet TAKE 1 TABLET BY MOUTH TWICE A DAY  . hydrochlorothiazide (HYDRODIURIL) 25 MG tablet TAKE 1 TABLET BY MOUTH EVERY DAY  . nystatin (MYCOSTATIN/NYSTOP) powder Apply topically  3 (three) times daily.  . tamsulosin (FLOMAX) 0.4 MG CAPS capsule TAKE 1 CAPSULE BY MOUTH EVERY DAY  . triamcinolone ointment (KENALOG) 0.1 % APPLY TO AFFECTED AREA TWICE A DAY  . Turmeric, Curcuma Longa, (CURCUMIN) POWD by Does not apply route. With ginger and black seed  . zolpidem (AMBIEN) 10  MG tablet TAKE 1 TABLET BY MOUTH AT BEDTIME AS NEEDED FOR SLEEP.   No facility-administered medications prior to visit.    Review of Systems  {Heme  Chem  Endocrine  Serology  Results Review (optional):23779::" "}  Objective    There were no vitals taken for this visit. {Show previous vital signs (optional):23777::" "}  Physical Exam  ***  No results found for any visits on 11/24/19.  Assessment & Plan     ***  No follow-ups on file.      {provider attestation***:1}   Rubye Beach  [facility] [phone] (phone) [phone] (fax)  [place]

## 2019-12-02 ENCOUNTER — Encounter: Payer: Self-pay | Admitting: Physician Assistant

## 2019-12-02 LAB — CDIFF NAA+O+P+STOOL CULTURE
E coli, Shiga toxin Assay: NEGATIVE
Toxigenic C. Difficile by PCR: NEGATIVE

## 2019-12-19 ENCOUNTER — Other Ambulatory Visit: Payer: Self-pay | Admitting: Physician Assistant

## 2019-12-19 DIAGNOSIS — K2100 Gastro-esophageal reflux disease with esophagitis, without bleeding: Secondary | ICD-10-CM

## 2019-12-30 DIAGNOSIS — M1711 Unilateral primary osteoarthritis, right knee: Secondary | ICD-10-CM | POA: Diagnosis not present

## 2019-12-30 DIAGNOSIS — M2550 Pain in unspecified joint: Secondary | ICD-10-CM | POA: Diagnosis not present

## 2019-12-30 DIAGNOSIS — Z96652 Presence of left artificial knee joint: Secondary | ICD-10-CM | POA: Diagnosis not present

## 2019-12-30 DIAGNOSIS — M1712 Unilateral primary osteoarthritis, left knee: Secondary | ICD-10-CM | POA: Diagnosis not present

## 2020-01-06 ENCOUNTER — Telehealth: Payer: Self-pay

## 2020-01-06 NOTE — Telephone Encounter (Signed)
Copied from [place] [id number]. Topic: General - Other >> Jan 06, 2020  2:35 PM Celene Kras wrote: Reason for CRM: Kennyth Lose, from [facility], [place] and is requesting to know if the fax for the on 12/19/19 has been received. Please advise.

## 2020-01-07 ENCOUNTER — Encounter: Payer: Self-pay | Admitting: Physician Assistant

## 2020-01-07 DIAGNOSIS — N9989 Other postprocedural complications and disorders of genitourinary system: Secondary | ICD-10-CM | POA: Insufficient documentation

## 2020-01-07 NOTE — Telephone Encounter (Signed)
They received the Rx but not the CMN / they will refax it now  / please advise

## 2020-01-08 ENCOUNTER — Encounter: Payer: Self-pay | Admitting: Physician Assistant

## 2020-01-12 NOTE — Telephone Encounter (Signed)
Gave it to front desk to fax.

## 2020-01-20 ENCOUNTER — Telehealth: Payer: Self-pay | Admitting: *Deleted

## 2020-01-20 NOTE — Chronic Care Management (AMB) (Addendum)
  Chronic Care Management   Note  01/20/2020 Name: Norrine Ballester MRN: [id number] DOB: 11/17/1955  Tyhesha Dutson is a 64 y.o. year old female who is a primary care patient of Rubye Beach. I reached out to [name] by phone today in response to a referral sent by Ms. Vickie Epley Trulock's health plan.     Ms. Butterbaugh was given information about Chronic Care Management services today including:  1. CCM service includes personalized support from designated clinical staff supervised by her physician, including individualized plan of care and coordination with other care providers 2. 24/7 contact phone numbers for assistance for urgent and routine care needs. 3. Service will only be billed when office clinical staff spend 20 minutes or more in a month to coordinate care. 4. Only one practitioner may furnish and bill the service in a calendar month. 5. The patient may stop CCM services at any time (effective at the end of the month) by phone call to the office staff. 6. The patient will be responsible for cost sharing (co-pay) of up to 20% of the service fee (after annual deductible is met).  Patient agreed to services and verbal consent obtained.   Follow up plan: Telephone appointment with care management team member scheduled for:02/03/2020  [place] [facility]  Direct Dial: [phone]

## 2020-01-28 DIAGNOSIS — Z79899 Other long term (current) drug therapy: Secondary | ICD-10-CM | POA: Insufficient documentation

## 2020-01-28 DIAGNOSIS — L409 Psoriasis, unspecified: Secondary | ICD-10-CM | POA: Insufficient documentation

## 2020-01-28 DIAGNOSIS — M47819 Spondylosis without myelopathy or radiculopathy, site unspecified: Secondary | ICD-10-CM | POA: Insufficient documentation

## 2020-01-28 DIAGNOSIS — L4050 Arthropathic psoriasis, unspecified: Secondary | ICD-10-CM | POA: Diagnosis not present

## 2020-01-28 DIAGNOSIS — Z111 Encounter for screening for respiratory tuberculosis: Secondary | ICD-10-CM | POA: Diagnosis not present

## 2020-01-28 DIAGNOSIS — M8949 Other hypertrophic osteoarthropathy, multiple sites: Secondary | ICD-10-CM | POA: Diagnosis not present

## 2020-01-30 DIAGNOSIS — N9989 Other postprocedural complications and disorders of genitourinary system: Secondary | ICD-10-CM | POA: Diagnosis not present

## 2020-01-30 DIAGNOSIS — R32 Unspecified urinary incontinence: Secondary | ICD-10-CM | POA: Diagnosis not present

## 2020-01-30 DIAGNOSIS — C541 Malignant neoplasm of endometrium: Secondary | ICD-10-CM | POA: Diagnosis not present

## 2020-02-03 ENCOUNTER — Ambulatory Visit: Payer: Medicare HMO

## 2020-02-03 DIAGNOSIS — N9989 Other postprocedural complications and disorders of genitourinary system: Secondary | ICD-10-CM

## 2020-02-03 DIAGNOSIS — M069 Rheumatoid arthritis, unspecified: Secondary | ICD-10-CM

## 2020-02-03 DIAGNOSIS — I10 Essential (primary) hypertension: Secondary | ICD-10-CM

## 2020-02-03 DIAGNOSIS — E785 Hyperlipidemia, unspecified: Secondary | ICD-10-CM

## 2020-02-03 NOTE — Patient Instructions (Addendum)
Thank you for allowing the Chronic Care Management team to participate in your care.   Goals Addressed            This Visit's Progress    Chronic Care Management       CARE PLAN ENTRY (see longitudinal plan of care for additional care plan information)  Current Barriers:   Chronic Disease Management support and education needs related to Diabetes, HTN, HLD and Rheumatoid Arthritis. (Hx of Endometrial Ca)   Case Manager Clinical Goal(s):  Over the next 120 days, patient:  Will not require hospitalization or emergent care d/t complications r/t chronic illnesses. Over the next 90 days, patient:  Will attend all scheduled medical appointments.  Will take all medications as prescribed.  Monitor BP and record readings.  Continue compliance with recommended heart healthy/diabetic diet.   Follow recommended safety measures to prevent falls and injuries.  Follow recommended aseptic self-catheterization technique to prevent infections.    Interventions:   Inter-disciplinary care team collaboration (see longitudinal plan of care)  Reviewed medications. Encouraged to take medications as prescribed and notify provider if unable to tolerate prescribed regimen. Encouraged to notify care management team with concerns regarding medication management or prescription cost. Reports taking medications as prescribed. Currently working with her insurance provider and pending feedback regarding Enbrel injections. Reports estimated costs of $2500/month. She anticipates receiving medication assistance. Agreed to update the care management team if additional assistance is needed from the CCM Pharmacist.    Provided information regarding established BP parameters and indications for notifying a provider. Encouraged to monitor routinely if unable to monitor daily and record readings.   Reviewed nutritional intake and diet recommendations. Reports doing well with intake and attempting to follow a  modified keto diet. Encouraged to continue reading nutrition labels and follow diet as advised by her provider. Encouraged to update care management team if additional nutritional resources are needed.   Discussed recommended safety measures to prevent falls and injuries. Encouraged to use assistive device as needed when ambulating. Encouraged to keep pathways clear and well lit. Currently using walker when ambulating. Reports mobility is significantly limited d/t Rheumatoid Arthritis. She is aware of activities to avoid to prevent accidental falls.    Discussed infection prevention measures r/t intermittent self-catheterization. Encouraged to utilize recommended aseptic techniques for each catheterization. Thoroughly discusses s/sx of infection and indications for seeking medical attention. Reports performing self catheterization approximately three times a day. She is very knowledgeable of reportable symptoms of infection. Currently receiving supplies every three months. Agreed to update the care management team if this changes and she requires assistance with obtaining supplies.    Reviewed scheduled/upcoming provider appointments. Encouraged to attend medical appointments as scheduled to prevent delays in care. Encouraged to notify the care management team with concerns regarding transportation.   Discussed plans for ongoing care management follow up. Provided direct contact information for the CCM Nurse Case Manager. Reports doing well in the home. She lives alone but states family members are close by and available to assist as needed. Denies urgent care management needs. Agreed to outreach again in two months. Will provide an update if a referral for CCM Pharmacy outreach is needed.   Patient Self Care Activities:   Self administers medications   Attends scheduled provider appointments  Calls pharmacy for medication refills  Performs ADL's independently  Calls provider office for new  concerns or questions   Initial goal documentation        Ms. Gassen was given information  about Chronic Care Management services including:  1. CCM service includes personalized support from designated clinical staff supervised by her physician, including individualized plan of care and coordination with other care providers 2. 24/7 contact phone numbers for assistance for urgent and routine care needs. 3. Service will only be billed when office clinical staff spend 20 minutes or more in a month to coordinate care. 4. Only one practitioner may furnish and bill the service in a calendar month. 5. The patient may stop CCM services at any time (effective at the end of the month) by phone call to the office staff. 6. The patient will be responsible for cost sharing (co-pay) of up to 20% of the service fee (after annual deductible is met).  Patient agreed to services and verbal consent obtained.    Ms. Dundon verbalized understanding of the information discussed during the telephonic outreach today. Declined need for a mailed/printed copy of the information.   A member of the care management team will follow-up with Ms. Pata in two months.    [name] [facility] Care Management [facility] [phone]

## 2020-02-03 NOTE — Chronic Care Management (AMB) (Signed)
Chronic Care Management   Initial Visit Note  02/03/2020 Name: Jean Gonzales MRN: [id number] DOB: 05-23-1955  Primary Care Provider: Mar Daring, PA-C Reason for referral : Chronic Care Management   Jean Gonzales is a 64 y.o. year old female who is a primary care patient of Jean Gonzales. The CCM team was consulted for assistance with chronic disease management and care coordination. A telephonic assessment was conducted today.  Review of Jean Gonzales's status, including review of consultants reports, relevant labs and test results was conducted today. Collaboration with appropriate care team members was performed as part of the comprehensive evaluation and provision of chronic care management services.    SDOH (Social Determinants of Health) assessments performed: Yes SDOH Interventions     Most Recent Value  SDOH Interventions  Food Insecurity Interventions Intervention Not Indicated  Transportation Interventions Intervention Not Indicated        Medications: Outpatient Encounter Medications as of 02/03/2020  Medication Sig  . acetaminophen (TYLENOL) 325 MG tablet Take 1-2 tablets (325-650 mg total) by mouth every 6 (six) hours as needed for mild pain (pain score 1-3 or temp > 100.5).  Marland Kitchen acetaminophen-codeine (TYLENOL #3) 300-30 MG tablet Take 1 tablet by mouth every 8 (eight) hours as needed for moderate pain or severe pain.  Marland Kitchen amoxicillin-clavulanate (AUGMENTIN) 875-125 MG tablet Take 1 tablet by mouth 2 (two) times daily.  Marland Kitchen aspirin EC 81 MG tablet Take 81 mg by mouth daily.  Marland Kitchen atorvastatin (LIPITOR) 40 MG tablet TAKE 1 TABLET BY MOUTH EVERY DAY IN THE MORNING  . celecoxib (CELEBREX) 100 MG capsule TAKE 1 CAPSULE BY MOUTH TWICE A DAY  . ciprofloxacin (CIPRO) 250 MG tablet Take 1 tablet (250 mg total) by mouth 2 (two) times daily.  . clindamycin (CLEOCIN) 300 MG capsule Take 1 capsule (300 mg total) by mouth 3 (three) times daily.  . diclofenac  sodium (VOLTAREN) 1 % GEL Apply 2 g topically 4 (four) times daily. Apply to knee  . diclofenac Sodium (VOLTAREN) 1 % GEL Apply 2 g topically 4 (four) times daily.  Marland Kitchen esomeprazole (NEXIUM) 40 MG capsule TAKE 1 CAPSULE BY MOUTH EVERY DAY  . furosemide (LASIX) 20 MG tablet TAKE 1 TABLET BY MOUTH TWICE A DAY  . hydrochlorothiazide (HYDRODIURIL) 25 MG tablet TAKE 1 TABLET BY MOUTH EVERY DAY  . nystatin (MYCOSTATIN/NYSTOP) powder Apply topically 3 (three) times daily.  . tamsulosin (FLOMAX) 0.4 MG CAPS capsule TAKE 1 CAPSULE BY MOUTH EVERY DAY  . triamcinolone ointment (KENALOG) 0.1 % APPLY TO AFFECTED AREA TWICE A DAY  . Turmeric, Curcuma Longa, (CURCUMIN) POWD by Does not apply route. With ginger and black seed  . zolpidem (AMBIEN) 10 MG tablet TAKE 1 TABLET BY MOUTH AT BEDTIME AS NEEDED FOR SLEEP.   No facility-administered encounter medications on file as of 02/03/2020.     Objective:  BP Readings from Last 3 Encounters:  10/06/19 (!) 152/96  08/08/19 133/87  08/04/19 126/84    Lab Results  Component Value Date   CHOL 169 07/03/2019   HDL 38 (L) 07/03/2019   LDLCALC 90 07/03/2019   TRIG 242 (H) 07/03/2019   CHOLHDL 4.4 07/03/2019   Lab Results  Component Value Date   HGBA1C 7.1 (H) 07/03/2019      Goals Addressed            This Visit's Progress   . Chronic Care Management       CARE PLAN ENTRY (see longitudinal plan  of care for additional care plan information)  Current Barriers:  . Chronic Disease Management support and education needs related to Diabetes, HTN, HLD and Rheumatoid Arthritis. (Hx of Endometrial Ca)   Case Manager Clinical Goal(s):  Over the next 120 days, patient: . Will not require hospitalization or emergent care d/t complications r/t chronic illnesses. Over the next 90 days, patient: . Will attend all scheduled medical appointments. . Will take all medications as prescribed. . Monitor BP and record readings. . Continue compliance with  recommended heart healthy/diabetic diet.  . Follow recommended safety measures to prevent falls and injuries. . Follow recommended aseptic self-catheterization technique to prevent infections.    Interventions:  . Inter-disciplinary care team collaboration (see longitudinal plan of care) . Reviewed medications. Encouraged to take medications as prescribed and notify provider if unable to tolerate prescribed regimen. Encouraged to notify care management team with concerns regarding medication management or prescription cost. Reports taking medications as prescribed. Currently working with her insurance provider and pending feedback regarding Enbrel injections. Reports estimated costs of $2500/month. She anticipates receiving medication assistance. Agreed to update the care management team if additional assistance is needed from the CCM Pharmacist.   . Provided information regarding established BP parameters and indications for notifying a provider. Encouraged to monitor routinely if unable to monitor daily and record readings.  . Reviewed nutritional intake and diet recommendations. Reports doing well with intake and attempting to follow a modified keto diet. Encouraged to continue reading nutrition labels and follow diet as advised by her provider. Encouraged to update care management team if additional nutritional resources are needed.  . Discussed recommended safety measures to prevent falls and injuries. Encouraged to use assistive device as needed when ambulating. Encouraged to keep pathways clear and well lit. Currently using walker when ambulating. Reports mobility is significantly limited d/t Rheumatoid Arthritis. She is aware of activities to avoid to prevent accidental falls.   . Discussed infection prevention measures r/t intermittent self-catheterization. Encouraged to utilize recommended aseptic techniques for each catheterization. Thoroughly discusses s/sx of infection and indications  for seeking medical attention. Reports performing self catheterization approximately three times a day. She is very knowledgeable of reportable symptoms of infection. Currently receiving supplies every three months. Agreed to update the care management team if this changes and she requires assistance with obtaining supplies.   . Reviewed scheduled/upcoming provider appointments. Encouraged to attend medical appointments as scheduled to prevent delays in care. Encouraged to notify the care management team with concerns regarding transportation.  . Discussed plans for ongoing care management follow up. Provided direct contact information for the CCM Nurse Case Manager. Reports doing well in the home. She lives alone but states family members are close by and available to assist as needed. Denies urgent care management needs. Agreed to outreach again in two months. Will provide an update if a referral for CCM Pharmacy outreach is needed.   Patient Self Care Activities:  . Self administers medications  . Attends scheduled provider appointments . Calls pharmacy for medication refills . Performs ADL's independently . Calls provider office for new concerns or questions   Initial goal documentation        Ms. Reber was given information about Chronic Care Management services including:  1. CCM service includes personalized support from designated clinical staff supervised by her physician, including individualized plan of care and coordination with other care providers 2. 24/7 contact phone numbers for assistance for urgent and routine care needs. 3. Service will only be  billed when office clinical staff spend 20 minutes or more in a month to coordinate care. 4. Only one practitioner may furnish and bill the service in a calendar month. 5. The patient may stop CCM services at any time (effective at the end of the month) by phone call to the office staff. 6. The patient will be responsible for cost  sharing (co-pay) of up to 20% of the service fee (after annual deductible is met).  Patient agreed to services and verbal consent obtained.     PLAN A member of the care management team will follow-up with Ms. Zellars in two months.   [name] [facility] Care Management [facility] [phone]

## 2020-02-26 DIAGNOSIS — R32 Unspecified urinary incontinence: Secondary | ICD-10-CM | POA: Diagnosis not present

## 2020-02-26 DIAGNOSIS — C541 Malignant neoplasm of endometrium: Secondary | ICD-10-CM | POA: Diagnosis not present

## 2020-02-26 DIAGNOSIS — N9989 Other postprocedural complications and disorders of genitourinary system: Secondary | ICD-10-CM | POA: Diagnosis not present

## 2020-03-09 ENCOUNTER — Ambulatory Visit: Payer: Self-pay

## 2020-03-09 ENCOUNTER — Emergency Department: Payer: Medicare HMO

## 2020-03-09 ENCOUNTER — Other Ambulatory Visit: Payer: Self-pay

## 2020-03-09 ENCOUNTER — Emergency Department
Admission: EM | Admit: 2020-03-09 | Discharge: 2020-03-09 | Disposition: A | Discharge status: Home / Self Care | Payer: Medicare HMO | Attending: Emergency Medicine | Admitting: Emergency Medicine

## 2020-03-09 DIAGNOSIS — K219 Gastro-esophageal reflux disease without esophagitis: Secondary | ICD-10-CM | POA: Insufficient documentation

## 2020-03-09 DIAGNOSIS — Z8542 Personal history of malignant neoplasm of other parts of uterus: Secondary | ICD-10-CM | POA: Insufficient documentation

## 2020-03-09 DIAGNOSIS — R109 Unspecified abdominal pain: Secondary | ICD-10-CM

## 2020-03-09 DIAGNOSIS — Z79899 Other long term (current) drug therapy: Secondary | ICD-10-CM | POA: Insufficient documentation

## 2020-03-09 DIAGNOSIS — E119 Type 2 diabetes mellitus without complications: Secondary | ICD-10-CM | POA: Insufficient documentation

## 2020-03-09 DIAGNOSIS — M459 Ankylosing spondylitis of unspecified sites in spine: Secondary | ICD-10-CM | POA: Diagnosis not present

## 2020-03-09 DIAGNOSIS — R197 Diarrhea, unspecified: Secondary | ICD-10-CM | POA: Insufficient documentation

## 2020-03-09 DIAGNOSIS — Z96651 Presence of right artificial knee joint: Secondary | ICD-10-CM | POA: Insufficient documentation

## 2020-03-09 DIAGNOSIS — R112 Nausea with vomiting, unspecified: Secondary | ICD-10-CM | POA: Insufficient documentation

## 2020-03-09 DIAGNOSIS — R1084 Generalized abdominal pain: Secondary | ICD-10-CM | POA: Diagnosis present

## 2020-03-09 DIAGNOSIS — R110 Nausea: Secondary | ICD-10-CM | POA: Diagnosis not present

## 2020-03-09 DIAGNOSIS — Z7982 Long term (current) use of aspirin: Secondary | ICD-10-CM | POA: Diagnosis not present

## 2020-03-09 DIAGNOSIS — R1111 Vomiting without nausea: Secondary | ICD-10-CM | POA: Diagnosis not present

## 2020-03-09 DIAGNOSIS — I10 Essential (primary) hypertension: Secondary | ICD-10-CM | POA: Diagnosis not present

## 2020-03-09 DIAGNOSIS — M47817 Spondylosis without myelopathy or radiculopathy, lumbosacral region: Secondary | ICD-10-CM | POA: Diagnosis not present

## 2020-03-09 DIAGNOSIS — K429 Umbilical hernia without obstruction or gangrene: Secondary | ICD-10-CM | POA: Diagnosis not present

## 2020-03-09 DIAGNOSIS — R000 Tachycardia, unspecified: Secondary | ICD-10-CM | POA: Diagnosis not present

## 2020-03-09 DIAGNOSIS — N3090 Cystitis, unspecified without hematuria: Secondary | ICD-10-CM | POA: Insufficient documentation

## 2020-03-09 DIAGNOSIS — R0902 Hypoxemia: Secondary | ICD-10-CM | POA: Diagnosis not present

## 2020-03-09 LAB — URINALYSIS, COMPLETE (UACMP) WITH MICROSCOPIC
Bilirubin Urine: NEGATIVE
Glucose, UA: NEGATIVE mg/dL
Ketones, ur: NEGATIVE mg/dL
Nitrite: NEGATIVE
Protein, ur: 30 mg/dL — AB
Specific Gravity, Urine: 1.009 (ref 1.005–1.030)
WBC, UA: >50 WBC/hpf — ABNORMAL HIGH (ref 0–5)
pH: 6 (ref 5.0–8.0)

## 2020-03-09 LAB — CBC
HCT: 39.3 % (ref 36.0–46.0)
Hemoglobin: 13.6 g/dL (ref 12.0–15.0)
MCH: 30.2 pg (ref 26.0–34.0)
MCHC: 34.6 g/dL (ref 30.0–36.0)
MCV: 87.1 fL (ref 80.0–100.0)
Platelets: 154 10*3/uL (ref 150–400)
RBC: 4.51 MIL/uL (ref 3.87–5.11)
RDW: 13.1 % (ref 11.5–15.5)
WBC: 16 10*3/uL — ABNORMAL HIGH (ref 4.0–10.5)
nRBC: 0 % (ref 0.0–0.2)

## 2020-03-09 LAB — COMPREHENSIVE METABOLIC PANEL
ALT: 23 U/L (ref 0–44)
AST: 25 U/L (ref 15–41)
Albumin: 4.1 g/dL (ref 3.5–5.0)
Alkaline Phosphatase: 74 U/L (ref 38–126)
Anion gap: 14 (ref 5–15)
BUN: 10 mg/dL (ref 8–23)
CO2: 24 mmol/L (ref 22–32)
Calcium: 9.3 mg/dL (ref 8.9–10.3)
Chloride: 99 mmol/L (ref 98–111)
Creatinine, Ser: 1.07 mg/dL — ABNORMAL HIGH (ref 0.44–1.00)
GFR, Estimated: 57 mL/min — ABNORMAL LOW (ref >60)
Glucose, Bld: 213 mg/dL — ABNORMAL HIGH (ref 70–99)
Potassium: 3.8 mmol/L (ref 3.5–5.1)
Sodium: 137 mmol/L (ref 135–145)
Total Bilirubin: 2.2 mg/dL — ABNORMAL HIGH (ref 0.3–1.2)
Total Protein: 7.9 g/dL (ref 6.5–8.1)

## 2020-03-09 LAB — LIPASE, BLOOD: Lipase: 21 U/L (ref 11–51)

## 2020-03-09 MED ORDER — ONDANSETRON 4 MG PO TBDP: 4.0000 mg | ORAL_TABLET | Freq: Once | ORAL | Status: DC | PRN

## 2020-03-09 MED ORDER — CEFDINIR 300 MG PO CAPS: 300.0000 mg | ORAL_CAPSULE | Freq: Two times a day (BID) | ORAL | 0 refills | Status: DC

## 2020-03-09 MED ORDER — ONDANSETRON HCL 4 MG/2ML IJ SOLN
4.0000 mg | Freq: Once | INTRAMUSCULAR | Status: AC
  Administered 2020-03-09: 4 mg via INTRAVENOUS
  Filled 2020-03-09: qty 2

## 2020-03-09 MED ORDER — SODIUM CHLORIDE 0.9 % IV SOLN
1.0000 g | Freq: Once | INTRAVENOUS | Status: AC
  Administered 2020-03-09: 1 g via INTRAVENOUS
  Filled 2020-03-09: qty 10

## 2020-03-09 MED ORDER — ACETAMINOPHEN 500 MG PO TABS
1000.0000 mg | ORAL_TABLET | Freq: Once | ORAL | Status: AC
  Administered 2020-03-09: 1000 mg via ORAL
  Filled 2020-03-09: qty 2

## 2020-03-09 MED ORDER — ONDANSETRON 4 MG PO TBDP: 4.0000 mg | ORAL_TABLET | Freq: Three times a day (TID) | ORAL | 0 refills | Status: DC | PRN

## 2020-03-09 MED ORDER — LACTATED RINGERS IV BOLUS
1000.0000 mL | Freq: Once | INTRAVENOUS | Status: AC
  Administered 2020-03-09: 1000 mL via INTRAVENOUS

## 2020-03-09 NOTE — ED Provider Notes (Signed)
[facility] Emergency Department Provider Note  ____________________________________________  Time seen: Approximately 3:34 PM  I have reviewed the triage vital signs and the nursing notes.   HISTORY  Chief Complaint Emesis and Diarrhea    HPI Jean Gonzales is a 64 y.o. female with a history of diabetes GERD hypertension ankylosing spondylitis and a history of endometrial cancer status post surgery chemo and radiation who comes the ED complaining of generalized abdominal pain which is crampy nonradiating, waxing and waning and associated nausea vomiting and diarrhea that started last night at about 10:00 PM.  She had eaten a normal dinner consisting of a barbecue sandwich which seemed perfectly fine.  She does note that this is happened 3 times in the past month.  She recently started Enbrel which she takes every Saturday, and the 3 episodes of GI symptoms seem to occur 3 days after an Enbrel injection each time.  Denies blood in vomit or black or bloody stool.  No fever.  Her urine does seem more cloudy recently.  She has to self cath.      Past Medical History:  Diagnosis Date  . Ankylosing spondylitis ([place])   . Arthritis    osteoarthritis right knee  . Cancer [facility]) 2014   endometrial CA  . Diabetes mellitus without complication (HCC)    diet controlled  . Endometrial cancer ([place])   . GERD (gastroesophageal reflux disease)   . Hyperlipidemia   . Hypertension    improved after ca tx     Patient Active Problem List   Diagnosis Date Noted  . Urinary anastomotic stricture 01/07/2020  . Acute cystitis without hematuria 10/06/2019  . Tooth abscess 10/06/2019  . Neuropathy 10/06/2019  . Class 2 severe obesity due to excess calories with serious comorbidity and body mass index (BMI) of 37.0 to 37.9 in adult ([place]) 10/06/2019  . Chest pain 09/29/2017  . History of total knee arthroplasty 08/01/2017  . Osteoarthritis of right knee 07/09/2017  .  Osteoarthritis 05/07/2017  . Synovitis of knee 12/29/2016  . Knee joint effusion 12/29/2016  . Malignant neoplasm of endometrium ([place]) 02/22/2015  . Acid reflux 02/22/2015  . History of migraine headaches 02/22/2015  . HLD (hyperlipidemia) 02/22/2015  . BP (high blood pressure) 02/22/2015  . History of colon polyps 02/22/2015  . Urinary incontinence 02/22/2015  . Psoriatic arthritis ([place]) 02/22/2015  . Diabetes mellitus ([place]) 10/06/2014  . Urethral meatal stenosis 12/03/2013  . Rheumatoid arthritis ([place]) 03/14/2013     Past Surgical History:  Procedure Laterality Date  . ABDOMINAL HYSTERECTOMY  2015   cancer  . BREAST BIOPSY Right 2008ish   core  . CATARACT EXTRACTION W/PHACO Right 02/05/2018   Procedure: CATARACT EXTRACTION PHACO AND INTRAOCULAR LENS PLACEMENT ([place]) RIGHT DIABETES IVA TOPICAL;  Surgeon: Eulogio Bear, MD;  Location: [place];  Service: Ophthalmology;  Laterality: Right;  . CATARACT EXTRACTION W/PHACO Left 02/25/2018   Procedure: CATARACT EXTRACTION PHACO AND INTRAOCULAR LENS PLACEMENT ([place]) LEFT DIABETIC;  Surgeon: Eulogio Bear, MD;  Location: [place];  Service: Ophthalmology;  Laterality: Left;  . CHOLECYSTECTOMY  1992  . COLONOSCOPY WITH PROPOFOL N/A 08/08/2019   Procedure: COLONOSCOPY WITH PROPOFOL;  Surgeon: Jonathon Bellows, MD;  Location: [facility] ENDOSCOPY;  Service: Gastroenterology;  Laterality: N/A;  . EYE SURGERY    . TONSILLECTOMY  1973  . TONSILLECTOMY    . TOTAL KNEE ARTHROPLASTY Right 07/09/2017   Procedure: TOTAL KNEE ARTHROPLASTY;  Surgeon: Lovell Sheehan, MD;  Location:  [facility] ORS;  Service: Orthopedics;  Laterality: Right;     Prior to Admission medications   Medication Sig Start Date End Date Taking? Authorizing Provider  acetaminophen (TYLENOL) 325 MG tablet Take 1-2 tablets (325-650 mg total) by mouth every 6 (six) hours as needed for mild pain (pain score 1-3 or temp > 100.5). 07/12/17   Lovell Sheehan, MD   acetaminophen-codeine (TYLENOL #3) 300-30 MG tablet Take 1 tablet by mouth every 8 (eight) hours as needed for moderate pain or severe pain. Patient not taking: Reported on 02/03/2020 10/06/19   Mar Daring, PA-C  amoxicillin-clavulanate (AUGMENTIN) 875-125 MG tablet Take 1 tablet by mouth 2 (two) times daily. Patient not taking: Reported on 02/03/2020 11/06/19   Mar Daring, PA-C  aspirin EC 81 MG tablet Take 81 mg by mouth daily.    [provider]  atorvastatin (LIPITOR) 40 MG tablet TAKE 1 TABLET BY MOUTH EVERY DAY IN THE MORNING 08/09/19   Mar Daring, PA-C  calcium-vitamin D (OSCAL WITH D) 500-200 MG-UNIT tablet Take 1 tablet by mouth.    [provider]  cefdinir (OMNICEF) 300 MG capsule Take 1 capsule (300 mg total) by mouth 2 (two) times daily. 03/09/20   Carrie Mew, MD  celecoxib (CELEBREX) 100 MG capsule TAKE 1 CAPSULE BY MOUTH TWICE A DAY 09/27/19   Fenton Malling M, PA-C  ciprofloxacin (CIPRO) 250 MG tablet Take 1 tablet (250 mg total) by mouth 2 (two) times daily. Patient not taking: Reported on 02/03/2020 10/28/19   Mar Daring, PA-C  clindamycin (CLEOCIN) 300 MG capsule Take 1 capsule (300 mg total) by mouth 3 (three) times daily. Patient not taking: Reported on 02/03/2020 10/06/19   Mar Daring, PA-C  diclofenac sodium (VOLTAREN) 1 % GEL Apply 2 g topically 4 (four) times daily. Apply to knee 03/11/18   Mar Daring, PA-C  diclofenac Sodium (VOLTAREN) 1 % GEL Apply 2 g topically 4 (four) times daily. 10/06/19   Mar Daring, PA-C  esomeprazole (NEXIUM) 40 MG capsule TAKE 1 CAPSULE BY MOUTH EVERY DAY 12/19/19   Mar Daring, PA-C  furosemide (LASIX) 20 MG tablet TAKE 1 TABLET BY MOUTH TWICE A DAY 04/02/18   Fenton Malling M, PA-C  hydrochlorothiazide (HYDRODIURIL) 25 MG tablet TAKE 1 TABLET BY MOUTH EVERY DAY 11/09/19   Mar Daring, PA-C  nystatin (MYCOSTATIN/NYSTOP) powder Apply  topically 3 (three) times daily. 07/03/19   Mar Daring, PA-C  ondansetron (ZOFRAN ODT) 4 MG disintegrating tablet Take 1 tablet (4 mg total) by mouth every 8 (eight) hours as needed for nausea or vomiting. 03/09/20   Carrie Mew, MD  tamsulosin (FLOMAX) 0.4 MG CAPS capsule TAKE 1 CAPSULE BY MOUTH EVERY DAY 11/09/19   Mar Daring, PA-C  triamcinolone ointment (KENALOG) 0.1 % APPLY TO AFFECTED AREA TWICE A DAY 05/14/19   Burnette, Clearnce Sorrel, PA-C  Turmeric, Curcuma [place], (CURCUMIN) POWD by Does not apply route. With ginger and black seed    [provider]  zolpidem (AMBIEN) 10 MG tablet TAKE 1 TABLET BY MOUTH AT BEDTIME AS NEEDED FOR SLEEP. 01/20/19   Mar Daring, PA-C     Allergies Iodinated diagnostic agents and Bactrim [sulfamethoxazole-trimethoprim]   Family History  Problem Relation Age of Onset  . Diabetes Mother   . Cancer Mother   . Heart disease Father   . Breast cancer Neg Hx   . Kidney cancer Neg Hx   . Bladder Cancer Neg Hx  Social History Social History   Tobacco Use  . Smoking status: Never Smoker  . Smokeless tobacco: Never Used  Vaping Use  . Vaping Use: Never used  Substance Use Topics  . Alcohol use: No    Alcohol/week: 0.0 standard drinks  . Drug use: No    Review of Systems  Constitutional:   No fever or chills.  ENT:   No sore throat. No rhinorrhea. Cardiovascular:   No chest pain or syncope. Respiratory:   No dyspnea or cough. Gastrointestinal:   Positive as above for abdominal pain, vomiting and diarrhea.  Musculoskeletal:   Negative for focal pain or swelling All other systems reviewed and are negative except as documented above in ROS and HPI.  ____________________________________________   PHYSICAL EXAM:  VITAL SIGNS: ED Triage Vitals  Enc Vitals Group     BP 03/09/20 1107 113/74     Pulse Rate 03/09/20 1107 (!) 129     Resp 03/09/20 1107 18     Temp 03/09/20 1107 99.1 F (37.3 C)     Temp  Source 03/09/20 1107 Oral     SpO2 03/09/20 1107 95 %     Weight 03/09/20 1110 230 lb (104.3 kg)     Height 03/09/20 1110 5\' 7"  (1.702 m)     Head Circumference --      Peak Flow --      Pain Score 03/09/20 1108 4     Pain Loc --      Pain Edu? --      Excl. in [place]? --     Vital signs reviewed, nursing assessments reviewed.   Constitutional:   Alert and oriented. Non-toxic appearance. Eyes:   Conjunctivae are normal. EOMI. PERRL. ENT      Head:   Normocephalic and atraumatic.      Nose:   Wearing a mask.      Mouth/Throat:   Wearing a mask.      Neck:   No meningismus. Full ROM. Hematological/Lymphatic/Immunilogical:   No cervical lymphadenopathy. Cardiovascular:   RRR, heart rate 95. Symmetric bilateral radial and DP pulses.  No murmurs. Cap refill less than 2 seconds. Respiratory:   Normal respiratory effort without tachypnea/retractions. Breath sounds are clear and equal bilaterally. No wheezes/rales/rhonchi. Gastrointestinal:   Soft with right-sided abdominal tenderness, not focal to McBurney's point.  Non distended.   No rebound, rigidity, or guarding. Musculoskeletal:   Normal range of motion in all extremities. No joint effusions.  No lower extremity tenderness.  No edema. Neurologic:   Normal speech and language.  Motor grossly intact. No acute focal neurologic deficits are appreciated.  Skin:    Skin is warm, dry and intact. No rash noted.  No petechiae, purpura, or bullae.  ____________________________________________    LABS (pertinent positives/negatives) (all labs ordered are listed, but only abnormal results are displayed) Labs Reviewed  COMPREHENSIVE METABOLIC PANEL - Abnormal; Notable for the following components:      Result Value   Glucose, Bld 213 (*)    Creatinine, Ser 1.07 (*)    Total Bilirubin 2.2 (*)    GFR, Estimated 57 (*)    All other components within normal limits  CBC - Abnormal; Notable for the following components:   WBC 16.0 (*)    All  other components within normal limits  URINALYSIS, COMPLETE (UACMP) WITH MICROSCOPIC - Abnormal; Notable for the following components:   Color, Urine YELLOW (*)    APPearance CLOUDY (*)    Hgb urine dipstick SMALL (*)  Protein, ur 30 (*)    Leukocytes,Ua LARGE (*)    WBC, UA >50 (*)    Bacteria, UA MANY (*)    All other components within normal limits  URINE CULTURE  LIPASE, BLOOD   ____________________________________________   EKG    ____________________________________________    RADIOLOGY  CT ABDOMEN PELVIS WO CONTRAST  Result Date: 03/09/2020 CLINICAL DATA:  Nausea vomiting diarrhea EXAM: CT ABDOMEN AND PELVIS WITHOUT CONTRAST TECHNIQUE: Multidetector CT imaging of the abdomen and pelvis was performed following the standard protocol without IV contrast. COMPARISON:  07/04/2017 FINDINGS: Lower chest: Lung bases demonstrate no acute consolidation or effusion. Mild scarring or atelectasis at the right base. Hepatobiliary: No focal hepatic abnormality or biliary dilatation. Status post cholecystectomy. Pancreas: Unremarkable. No pancreatic ductal dilatation or surrounding inflammatory changes. Spleen: Normal in size without focal abnormality. Adrenals/Urinary Tract: Adrenal glands are normal. Prominent right renal pelvis without frank hydronephrosis. No hydroureter. No ureteral stones. Mildly thick-walled appearance of the urinary bladder Stomach/Bowel: Stomach is nonenlarged. No dilated small bowel. Left colon diverticular disease without acute inflammatory process. Vascular/Lymphatic: Mild aortic atherosclerosis. No aneurysm. No suspicious nodes Reproductive: Status post hysterectomy. No adnexal masses. Other: Negative for free air or free fluid. Small fat containing umbilical hernia Musculoskeletal: Degenerative changes at L5-S1. No acute or suspicious osseous abnormality IMPRESSION: 1. Mildly thick-walled appearance of the urinary bladder, question cystitis. 2. Left colon  diverticular disease without acute inflammatory process. Aortic Atherosclerosis (ICD10-I70.0). Electronically Signed   By: Donavan Foil M.D.   On: 03/09/2020 15:59    ____________________________________________   PROCEDURES Procedures  ____________________________________________  DIFFERENTIAL DIAGNOSIS   Bowel obstruction, diverticulitis, ureterolithiasis, pyelonephritis, lymphoma, intra-abdominal abscess  CLINICAL IMPRESSION / ASSESSMENT AND PLAN / ED COURSE  Medications ordered in the ED: Medications  ondansetron (ZOFRAN-ODT) disintegrating tablet 4 mg (has no administration in time range)  ondansetron (ZOFRAN) injection 4 mg (4 mg Intravenous Given 03/09/20 1548)  lactated ringers bolus 1,000 mL (1,000 mLs Intravenous New Bag/Given 03/09/20 1633)  cefTRIAXone (ROCEPHIN) 1 g in sodium chloride 0.9 % 100 mL IVPB (0 g Intravenous Stopped 03/09/20 1633)    Pertinent labs & imaging results that were available during my care of the patient were reviewed by me and considered in my medical decision making (see chart for details).  Cale Decarolis was evaluated in Emergency Department on 03/09/2020 for the symptoms described in the history of present illness. She was evaluated in the context of the global COVID-19 pandemic, which necessitated consideration that the patient might be at risk for infection with the SARS-CoV-2 virus that causes COVID-19. Institutional protocols and algorithms that pertain to the evaluation of patients at risk for COVID-19 are in a state of rapid change based on information released by regulatory bodies including the CDC and federal and state organizations. These policies and algorithms were followed during the patient's care in the ED.   Patient presents with abdominal pain and nausea vomiting and diarrhea since last night.  She has some right-sided abdominal tenderness as well.  She is immunosuppressed on Enbrel, and has a history of intra-abdominal  metastatic cancer.  She will need a CT scan to further evaluate while giving IV fluids and Zofran for hydration and nausea relief, IV Rocephin for UTI seen on labs.  She has a mild leukocytosis of 16,000, chemistry panel is unremarkable.  Clinical Course as of 03/09/20 1835  Tue Mar 09, 2020  1624 CT confirms cystitis, otherwise unremarkable.  [PS]    Clinical Course User Index [  PS] Carrie Mew, MD    Patient feels better, tolerating p.o.  Will start on [place], discharge home which patient is agreeable to. ____________________________________________   FINAL CLINICAL IMPRESSION(S) / ED DIAGNOSES    Final diagnoses:  Cystitis  Abdominal pain, unspecified abdominal location  Ankylosing spondylitis, unspecified site of spine [facility])     ED Discharge Orders         Ordered    cefdinir (OMNICEF) 300 MG capsule  2 times daily        03/09/20 1833    ondansetron (ZOFRAN ODT) 4 MG disintegrating tablet  Every 8 hours PRN        03/09/20 1833          Portions of this note were generated with dragon dictation software. Dictation errors may occur despite best attempts at proofreading.   Carrie Mew, MD 03/09/20 [phone]

## 2020-03-09 NOTE — ED Notes (Signed)
Pt given graham crackers and ice water

## 2020-03-09 NOTE — Chronic Care Management (AMB) (Signed)
  Chronic Care Management   Note  03/09/2020 Name: Jean Gonzales MRN: [id number] DOB: 11/21/1955   Returned call to [name]. She reports feeling "very sick" today due to uncontrolled vomiting since last night. She was speaking in short phrases and notably winded during our conversation. Complained of severe headaches, dizziness, severe back pain, and  edema to her lower extremities particularly the left foot. Also noted cloudy discolored urine for the past few days. Denied current complaints of chest pain or palpitations but experienced what she described as  "very bad rib pain" during the night and earlier this morning. She was alone at the time of the call. Indicated that she was initially waiting to hear back from a family member but agreeable to being transported to the Emergency Department via emergency transport. Emergency Medical Services notified and given instructions to enter through the back door.   PLAN Will update her primary care provider, Jean Gonzales. Follow up pending discharge disposition.     [name] [phone]

## 2020-03-09 NOTE — ED Notes (Signed)
In and out performed with Joellen Jersey RN

## 2020-03-09 NOTE — Chronic Care Management (AMB) (Signed)
Encounter opened in error. Please disregard.

## 2020-03-09 NOTE — ED Triage Notes (Addendum)
Pt comes into the ED via EMS from home with c/o N/V/D since 10pm last night. States this has happened 3 times in the past month.. pt is in NAD at present. Pt states she has to self cath and has noticing the urine is cloudy and has a foul odor to it for the past couple of days,

## 2020-03-11 LAB — URINE CULTURE: Culture: >=100000 — AB

## 2020-03-22 ENCOUNTER — Encounter: Payer: Self-pay | Admitting: Physician Assistant

## 2020-03-22 MED ORDER — CLINDAMYCIN HCL 300 MG PO CAPS: 300.0000 mg | ORAL_CAPSULE | Freq: Three times a day (TID) | ORAL | 0 refills | Status: DC

## 2020-03-29 DIAGNOSIS — M8949 Other hypertrophic osteoarthropathy, multiple sites: Secondary | ICD-10-CM | POA: Diagnosis not present

## 2020-03-29 DIAGNOSIS — L4050 Arthropathic psoriasis, unspecified: Secondary | ICD-10-CM | POA: Diagnosis not present

## 2020-03-29 DIAGNOSIS — M150 Primary generalized (osteo)arthritis: Secondary | ICD-10-CM | POA: Insufficient documentation

## 2020-03-29 DIAGNOSIS — M47819 Spondylosis without myelopathy or radiculopathy, site unspecified: Secondary | ICD-10-CM | POA: Diagnosis not present

## 2020-03-29 DIAGNOSIS — M159 Polyosteoarthritis, unspecified: Secondary | ICD-10-CM | POA: Insufficient documentation

## 2020-03-29 DIAGNOSIS — L409 Psoriasis, unspecified: Secondary | ICD-10-CM | POA: Diagnosis not present

## 2020-03-29 DIAGNOSIS — Z79899 Other long term (current) drug therapy: Secondary | ICD-10-CM | POA: Diagnosis not present

## 2020-04-06 ENCOUNTER — Ambulatory Visit (INDEPENDENT_AMBULATORY_CARE_PROVIDER_SITE_OTHER): Payer: Medicare HMO

## 2020-04-06 DIAGNOSIS — E1142 Type 2 diabetes mellitus with diabetic polyneuropathy: Secondary | ICD-10-CM

## 2020-04-06 DIAGNOSIS — M069 Rheumatoid arthritis, unspecified: Secondary | ICD-10-CM | POA: Diagnosis not present

## 2020-04-21 ENCOUNTER — Ambulatory Visit (INDEPENDENT_AMBULATORY_CARE_PROVIDER_SITE_OTHER): Payer: Medicare HMO | Admitting: Physician Assistant

## 2020-04-21 ENCOUNTER — Other Ambulatory Visit: Payer: Self-pay

## 2020-04-21 ENCOUNTER — Encounter: Payer: Self-pay | Admitting: Physician Assistant

## 2020-04-21 VITALS — BP 154/82 | HR 85 | Temp 98.5°F | Resp 16 | Wt 236.0 lb

## 2020-04-21 DIAGNOSIS — E1142 Type 2 diabetes mellitus with diabetic polyneuropathy: Secondary | ICD-10-CM | POA: Diagnosis not present

## 2020-04-21 DIAGNOSIS — Z6836 Body mass index (BMI) 36.0-36.9, adult: Secondary | ICD-10-CM

## 2020-04-21 DIAGNOSIS — I10 Essential (primary) hypertension: Secondary | ICD-10-CM | POA: Diagnosis not present

## 2020-04-21 LAB — POCT GLYCOSYLATED HEMOGLOBIN (HGB A1C): Hemoglobin A1C: 7.2 % — AB (ref 4.0–5.6)

## 2020-04-21 NOTE — Progress Notes (Signed)
Established patient visit   Patient: Jean Gonzales   DOB: 03/13/1956   65 y.o. Female  MRN: [id number] Visit Date: 04/21/2020  Today's healthcare provider: Mar Daring, PA-C   Chief Complaint  Patient presents with  . Hypertension  . Diabetes   Subjective    HPI  Hypertension, follow-up  BP Readings from Last 3 Encounters:  04/21/20 (!) 154/82  03/09/20 (!) 125/56  10/06/19 (!) 152/96   Wt Readings from Last 3 Encounters:  04/21/20 236 lb (107 kg)  03/09/20 230 lb (104.3 kg)  10/06/19 240 lb (108.9 kg)     She was last seen for hypertension 6 months ago.  BP at that visit was 152/96. Management since that visit includes no changes.  She reports good compliance with treatment. She is not having side effects.  She is following a Regular diet. She is not exercising. She does not smoke.  Use of agents associated with hypertension: none.   Outside blood pressures are not being checked. Symptoms: No chest pain No chest pressure  No palpitations No syncope  No dyspnea No orthopnea  No paroxysmal nocturnal dyspnea No lower extremity edema   Pertinent labs: Lab Results  Component Value Date   CHOL 169 07/03/2019   HDL 38 (L) 07/03/2019   LDLCALC 90 07/03/2019   TRIG 242 (H) 07/03/2019   CHOLHDL 4.4 07/03/2019   Lab Results  Component Value Date   NA 137 03/09/2020   K 3.8 03/09/2020   CREATININE 1.07 (H) 03/09/2020   GFRNONAA 57 (L) 03/09/2020   GFRAA 68 07/03/2019   GLUCOSE 213 (H) 03/09/2020     The 10-year ASCVD risk score Mikey Bussing DC Jr., et al., 2013) is: 22.7%   Diabetes Mellitus Type II, Follow-up  Lab Results  Component Value Date   HGBA1C 7.2 (A) 04/21/2020   HGBA1C 7.1 (H) 07/03/2019   HGBA1C 6.9 (H) 04/19/2018   Wt Readings from Last 3 Encounters:  04/21/20 236 lb (107 kg)  03/09/20 230 lb (104.3 kg)  10/06/19 240 lb (108.9 kg)   Last seen for diabetes 6 months ago.  Management since then includes no changes. She  reports good compliance with treatment. She is not having side effects.  Symptoms: Yes fatigue No foot ulcerations  No appetite changes Yes nausea  No paresthesia of the feet  No polydipsia  No polyuria No visual disturbances   No vomiting     Home blood sugar records: not being checked  Episodes of hypoglycemia? No    Current insulin regiment: none Most Recent Eye Exam: due Current exercise: no regular exercise Current diet habits: well balanced  Pertinent Labs: Lab Results  Component Value Date   CHOL 169 07/03/2019   HDL 38 (L) 07/03/2019   LDLCALC 90 07/03/2019   TRIG 242 (H) 07/03/2019   CHOLHDL 4.4 07/03/2019   Lab Results  Component Value Date   NA 137 03/09/2020   K 3.8 03/09/2020   CREATININE 1.07 (H) 03/09/2020   GFRNONAA 57 (L) 03/09/2020   GFRAA 68 07/03/2019   GLUCOSE 213 (H) 03/09/2020     ---------------------------------------------------------------------------------------------------   Patient Active Problem List   Diagnosis Date Noted  . Urinary anastomotic stricture 01/07/2020  . Acute cystitis without hematuria 10/06/2019  . Tooth abscess 10/06/2019  . Neuropathy 10/06/2019  . Class 2 severe obesity due to excess calories with serious comorbidity and body mass index (BMI) of 37.0 to 37.9 in adult ([place]) 10/06/2019  . Chest pain  09/29/2017  . History of total knee arthroplasty 08/01/2017  . Osteoarthritis of right knee 07/09/2017  . Osteoarthritis 05/07/2017  . Synovitis of knee 12/29/2016  . Knee joint effusion 12/29/2016  . Malignant neoplasm of endometrium ([place]) 02/22/2015  . Acid reflux 02/22/2015  . History of migraine headaches 02/22/2015  . HLD (hyperlipidemia) 02/22/2015  . BP (high blood pressure) 02/22/2015  . History of colon polyps 02/22/2015  . Urinary incontinence 02/22/2015  . Psoriatic arthritis ([place]) 02/22/2015  . Diabetes mellitus ([place]) 10/06/2014  . Urethral meatal stenosis 12/03/2013  . Rheumatoid arthritis ([place])  03/14/2013   Past Medical History:  Diagnosis Date  . Ankylosing spondylitis ([place])   . Arthritis    osteoarthritis right knee  . [facility]) 2014   endometrial CA  . Diabetes mellitus without complication (HCC)    diet controlled  . Endometrial cancer ([place])   . GERD (gastroesophageal reflux disease)   . Hyperlipidemia   . Hypertension    improved after ca tx       Medications: Outpatient Medications Prior to Visit  Medication Sig  . acetaminophen (TYLENOL) 325 MG tablet Take 1-2 tablets (325-650 mg total) by mouth every 6 (six) hours as needed for mild pain (pain score 1-3 or temp > 100.5).  Marland Kitchen acetaminophen-codeine (TYLENOL #3) 300-30 MG tablet Take 1 tablet by mouth every 8 (eight) hours as needed for moderate pain or severe pain. (Patient not taking: Reported on 02/03/2020)  . amoxicillin-clavulanate (AUGMENTIN) 875-125 MG tablet Take 1 tablet by mouth 2 (two) times daily. (Patient not taking: Reported on 02/03/2020)  . aspirin EC 81 MG tablet Take 81 mg by mouth daily.  Marland Kitchen atorvastatin (LIPITOR) 40 MG tablet TAKE 1 TABLET BY MOUTH EVERY DAY IN THE MORNING  . calcium-vitamin D (OSCAL WITH D) 500-200 MG-UNIT tablet Take 1 tablet by mouth.  . cefdinir (OMNICEF) 300 MG capsule Take 1 capsule (300 mg total) by mouth 2 (two) times daily.  . celecoxib (CELEBREX) 100 MG capsule TAKE 1 CAPSULE BY MOUTH TWICE A DAY  . ciprofloxacin (CIPRO) 250 MG tablet Take 1 tablet (250 mg total) by mouth 2 (two) times daily. (Patient not taking: Reported on 02/03/2020)  . clindamycin (CLEOCIN) 300 MG capsule Take 1 capsule (300 mg total) by mouth 3 (three) times daily.  . diclofenac sodium (VOLTAREN) 1 % GEL Apply 2 g topically 4 (four) times daily. Apply to knee  . diclofenac Sodium (VOLTAREN) 1 % GEL Apply 2 g topically 4 (four) times daily.  Marland Kitchen esomeprazole (NEXIUM) 40 MG capsule TAKE 1 CAPSULE BY MOUTH EVERY DAY  . furosemide (LASIX) 20 MG tablet TAKE 1 TABLET BY MOUTH TWICE A DAY  .  hydrochlorothiazide (HYDRODIURIL) 25 MG tablet TAKE 1 TABLET BY MOUTH EVERY DAY  . nystatin (MYCOSTATIN/NYSTOP) powder Apply topically 3 (three) times daily.  . ondansetron (ZOFRAN ODT) 4 MG disintegrating tablet Take 1 tablet (4 mg total) by mouth every 8 (eight) hours as needed for nausea or vomiting.  . tamsulosin (FLOMAX) 0.4 MG CAPS capsule TAKE 1 CAPSULE BY MOUTH EVERY DAY  . triamcinolone ointment (KENALOG) 0.1 % APPLY TO AFFECTED AREA TWICE A DAY  . Turmeric, Curcuma Longa, (CURCUMIN) POWD by Does not apply route. With ginger and black seed  . zolpidem (AMBIEN) 10 MG tablet TAKE 1 TABLET BY MOUTH AT BEDTIME AS NEEDED FOR SLEEP.   No facility-administered medications prior to visit.    Review of Systems  Constitutional: Negative.   Respiratory: Negative.   Musculoskeletal: Negative.  Neurological: Negative.     Last CBC Lab Results  Component Value Date   WBC 16.0 (H) 03/09/2020   HGB 13.6 03/09/2020   HCT 39.3 03/09/2020   MCV 87.1 03/09/2020   MCH 30.2 03/09/2020   RDW 13.1 03/09/2020   PLT 154 27/05/5007   Last metabolic panel Lab Results  Component Value Date   GLUCOSE 213 (H) 03/09/2020   NA 137 03/09/2020   K 3.8 03/09/2020   CL 99 03/09/2020   CO2 24 03/09/2020   BUN 10 03/09/2020   CREATININE 1.07 (H) 03/09/2020   GFRNONAA 57 (L) 03/09/2020   GFRAA 68 07/03/2019   CALCIUM 9.3 03/09/2020   PHOS 3.4 05/21/2018   PROT 7.9 03/09/2020   ALBUMIN 4.1 03/09/2020   LABGLOB 3.0 07/03/2019   AGRATIO 1.5 07/03/2019   BILITOT 2.2 (H) 03/09/2020   ALKPHOS 74 03/09/2020   AST 25 03/09/2020   ALT 23 03/09/2020   ANIONGAP 14 03/09/2020   Last lipids Lab Results  Component Value Date   CHOL 169 07/03/2019   HDL 38 (L) 07/03/2019   LDLCALC 90 07/03/2019   TRIG 242 (H) 07/03/2019   CHOLHDL 4.4 07/03/2019       Objective    BP (!) 154/82   Pulse 85   Temp 98.5 F (36.9 C)   Resp 16   Wt 236 lb (107 kg)   BMI 36.96 kg/m  BP Readings from Last 3  Encounters:  04/21/20 (!) 154/82  03/09/20 (!) 125/56  10/06/19 (!) 152/96   Wt Readings from Last 3 Encounters:  04/21/20 236 lb (107 kg)  03/09/20 230 lb (104.3 kg)  10/06/19 240 lb (108.9 kg)       Physical Exam Vitals reviewed.  Constitutional:      General: She is not in acute distress.    Appearance: She is well-developed and well-nourished. She is not diaphoretic.  Neck:     Thyroid: No thyromegaly.     Vascular: No JVD.     Trachea: No tracheal deviation.  Cardiovascular:     Rate and Rhythm: Normal rate and regular rhythm.     Heart sounds: Normal heart sounds. No murmur heard. No friction rub. No gallop.   Pulmonary:     Effort: Pulmonary effort is normal. No respiratory distress.     Breath sounds: Normal breath sounds. No wheezing or rales.  Musculoskeletal:     Cervical back: Normal range of motion and neck supple.  Lymphadenopathy:     Cervical: No cervical adenopathy.      Results for orders placed or performed in visit on 04/21/20  POCT glycosylated hemoglobin (Hb A1C)  Result Value Ref Range   Hemoglobin A1C 7.2 (A) 4.0 - 5.6 %   HbA1c POC (<> result, manual entry)     HbA1c, POC (prediabetic range)     HbA1c, POC (controlled diabetic range)      Assessment & Plan     1. Type 2 diabetes mellitus with diabetic polyneuropathy, without long-term current use of insulin (HCC) A1c doing well at 7.2. Diet controlled. Return in 3 months.  - POCT glycosylated hemoglobin (Hb A1C)  2. Primary hypertension Slightly elevated. Will monitor at home. Recheck in 3 months.   3. Class 2 severe obesity due to excess calories with serious comorbidity and body mass index (BMI) of 36.0 to 36.9 in adult [facility]) Counseled patient on healthy lifestyle modifications including dieting and exercise.    No follow-ups on file.  Reynolds Bowl, PA-C, have reviewed all documentation for this visit. The documentation on 05/14/20 for the exam, diagnosis,  procedures, and orders are all accurate and complete.   Rubye Beach  [facility] [phone] (phone) [phone] (fax)  [place]

## 2020-05-14 ENCOUNTER — Encounter: Payer: Self-pay | Admitting: Physician Assistant

## 2020-05-19 NOTE — Patient Instructions (Signed)
Thank you for allowing the Chronic Care Management team to participate in your care.   Patient Care Plan: Diabetes Type 2 (Adult)    Problem Identified: Glycemic Management (Diabetes, Type 2)     Long-Range Goal: Glycemic Management Optimized   Start Date: 04/06/2020  Expected End Date: 08/04/2020  Priority: High  Note:    Current Barriers:   Chronic disease management and support related to Diabetes self-management  Case Manager Clinical Goal(s):   Over the next 120 days, patient will improve glycemic control and adhere to recommended carb modified diet.   Interventions:   Collaboration with Mar Daring, PA-C regarding development and update of comprehensive plan of care as evidenced by provider attestation and co-signature  Inter-disciplinary care team collaboration (see longitudinal plan of care)  Discussed current plan for diabetes self management. She prefers to continue plan for diet modification vs medications. Attempting to adhere to a Keto diet. Encouraged to continue reading nutrition labels and adhering to a Keto/Carb modified diet. Encouraged to complete labs as scheduled to monitor A1C.  Discussed referrals for available Diabetes education classes. Declined current need for additional resources but will consider if significant increase is noted with her A1C.  Discussed importance of completing recommended DM preventive care.    Patient Goals/Self-Care Activities Over the next 120 days, patient will:  - Adhere to provider's recommendation for glycemic control. - Attend all scheduled provider appointments - Complete labs as recommended - Notify provider or care management team with health related questions and new concerns.   Follow Up Plan:  -Will follow up next month     Patient Care Plan: Rheumatoid Arthritis    Problem Identified: Mobility and Function (Rheumatoid Arthritis)     Long-Range Goal: Maintain Mobility and Function   Start Date:  04/06/2020  Expected End Date: 08/04/2020  Priority: High  Note:   Current Barriers:   Impaired physical mobility r/t Rheumatoid Arthritis.  Clinical Goal(s):  Over the next 120 days:  Patient will maintain ability to function with absence/limitation of contractures.  Patient will not experience falls.  Interventions:   Collaboration with Mar Daring, PA-C regarding development and update of comprehensive plan of care as evidenced by provider attestation and co-signature  Inter-disciplinary care team collaboration (see longitudinal plan of care)  Reviewed medications and discussed current treatment plan. Encouraged to continue taking medications as prescribed. Advised to notify Rheumatologist if unable to tolerate the prescribed regimen. Encouraged to keep the CCM Pharmacist updated with concerns regarding the costs of Enbrel.  Discussed current level of function and options for in-home assistance. She is able to complete ADL's and function in the home independently. Family members are available to assist with transportation when needed. Encouraged to keep the care management team updated of needs.  Reviewed safety and fall prevention measures. Encouraged to continue using assistive devices as advised. Encouraged to consider options for a medical alert device since she currently lives alone.  Reviewed increased risk for infection r/t Rheumatoid Arthritis. Discussed importance of adhering to recommended strategies to reduce risk of infection. Reviewed worsening s/sx that require immediate medical attention.  Patient Goals/Self-Care Activities Over the next 120 days, patient will:   -Take medications as prescribed -Utilize assistive device appropriately with all ambulation -Change positions slowly -Demonstrate self and pet awareness at all times -Wear secure non-skid footwear with all ambulation -Keep pathways clear and well lit to prevent accidental falls -Complete labs and  attend clinical visits with Rheumatologist as scheduled  Follow Up Plan:  Will follow-up next month            Ms. Riling verbalized understanding of the information discussed during the telephonic outreach today. Declined need for mailed/printed instructions or resources. A member of the care management team will follow up with Ms. Soltys next month.    [name] [facility] Care Management [facility] [phone]

## 2020-05-19 NOTE — Chronic Care Management (AMB) (Signed)
Chronic Care Management   Follow Up Note    Name: Jean Gonzales MRN: [id number] DOB: January 27, 1956  Primary Care Provider: Mar Daring, PA-C Reason for referral : Chronic Care Management  Jean Gonzales is a 65 y.o. year old female who is a primary care patient of Jean Gonzales. She is currently engaged with the care management team.  Review of Ms. Dufner's status, including review of consultants reports, relevant labs and test results was conducted today. Collaboration with appropriate care team members was performed as part of the comprehensive evaluation and provision of chronic care management services.    SDOH (Social Determinants of Health) assessments performed: No    Outpatient Encounter Medications as of 04/06/2020  Medication Sig  . acetaminophen (TYLENOL) 325 MG tablet Take 1-2 tablets (325-650 mg total) by mouth every 6 (six) hours as needed for mild pain (pain score 1-3 or temp > 100.5).  Marland Kitchen aspirin EC 81 MG tablet Take 81 mg by mouth daily.  Marland Kitchen atorvastatin (LIPITOR) 40 MG tablet TAKE 1 TABLET BY MOUTH EVERY DAY IN THE MORNING  . calcium-vitamin D (OSCAL WITH D) 500-200 MG-UNIT tablet Take 1 tablet by mouth.  . celecoxib (CELEBREX) 100 MG capsule TAKE 1 CAPSULE BY MOUTH TWICE A DAY  . diclofenac sodium (VOLTAREN) 1 % GEL Apply 2 g topically 4 (four) times daily. Apply to knee  . diclofenac Sodium (VOLTAREN) 1 % GEL Apply 2 g topically 4 (four) times daily.  Marland Kitchen esomeprazole (NEXIUM) 40 MG capsule TAKE 1 CAPSULE BY MOUTH EVERY DAY  . furosemide (LASIX) 20 MG tablet TAKE 1 TABLET BY MOUTH TWICE A DAY  . nystatin (MYCOSTATIN/NYSTOP) powder Apply topically 3 (three) times daily.  . ondansetron (ZOFRAN ODT) 4 MG disintegrating tablet Take 1 tablet (4 mg total) by mouth every 8 (eight) hours as needed for nausea or vomiting.  . tamsulosin (FLOMAX) 0.4 MG CAPS capsule TAKE 1 CAPSULE BY MOUTH EVERY DAY  . triamcinolone ointment (KENALOG) 0.1 % APPLY TO  AFFECTED AREA TWICE A DAY  . Turmeric, Curcuma Longa, (CURCUMIN) POWD by Does not apply route. With ginger and black seed  . zolpidem (AMBIEN) 10 MG tablet TAKE 1 TABLET BY MOUTH AT BEDTIME AS NEEDED FOR SLEEP.  . [DISCONTINUED] acetaminophen-codeine (TYLENOL #3) 300-30 MG tablet Take 1 tablet by mouth every 8 (eight) hours as needed for moderate pain or severe pain. (Patient not taking: Reported on 02/03/2020)  . [DISCONTINUED] amoxicillin-clavulanate (AUGMENTIN) 875-125 MG tablet Take 1 tablet by mouth 2 (two) times daily. (Patient not taking: Reported on 02/03/2020)  . [DISCONTINUED] cefdinir (OMNICEF) 300 MG capsule Take 1 capsule (300 mg total) by mouth 2 (two) times daily.  . [DISCONTINUED] ciprofloxacin (CIPRO) 250 MG tablet Take 1 tablet (250 mg total) by mouth 2 (two) times daily. (Patient not taking: Reported on 02/03/2020)  . [DISCONTINUED] clindamycin (CLEOCIN) 300 MG capsule Take 1 capsule (300 mg total) by mouth 3 (three) times daily.  . [DISCONTINUED] hydrochlorothiazide (HYDRODIURIL) 25 MG tablet TAKE 1 TABLET BY MOUTH EVERY DAY   No facility-administered encounter medications on file as of 04/06/2020.       Objective:  Patient Care Plan: Diabetes Type 2 (Adult)    Problem Identified: Glycemic Management (Diabetes, Type 2)     Long-Range Goal: Glycemic Management Optimized   Start Date: 04/06/2020  Expected End Date: 08/04/2020  Priority: High  Note:    Current Barriers:  . Chronic disease management and support related to Diabetes self-management  Case  Manager Clinical Goal(s):  Marland Kitchen Over the next 120 days, patient will improve glycemic control and adhere to recommended carb modified diet.   Interventions:  . Collaboration with Mar Daring, PA-C regarding development and update of comprehensive plan of care as evidenced by provider attestation and co-signature . Inter-disciplinary care team collaboration (see longitudinal plan of care) . Discussed current plan for  diabetes self management. She prefers to continue plan for diet modification vs medications. Attempting to adhere to a Keto diet. Encouraged to continue reading nutrition labels and adhering to a Keto/Carb modified diet. Encouraged to complete labs as scheduled to monitor A1C. Marland Kitchen Discussed referrals for available Diabetes education classes. Declined current need for additional resources but will consider if significant increase is noted with her A1C. Marland Kitchen Discussed importance of completing recommended DM preventive care.    Patient Goals/Self-Care Activities Over the next 120 days, patient will:  - Adhere to provider's recommendation for glycemic control. - Attend all scheduled provider appointments - Complete labs as recommended - Notify provider or care management team with health related questions and new concerns.   Follow Up Plan:  -Will follow up next month     Patient Care Plan: Rheumatoid Arthritis    Problem Identified: Mobility and Function (Rheumatoid Arthritis)     Long-Range Goal: Maintain Mobility and Function   Start Date: 04/06/2020  Expected End Date: 08/04/2020  Priority: High  Note:   Current Barriers:  . Impaired physical mobility r/t Rheumatoid Arthritis.  Clinical Goal(s):  Over the next 120 days: . Patient will maintain ability to function with absence/limitation of contractures. . Patient will not experience falls.  Interventions:  . Collaboration with Mar Daring, PA-C regarding development and update of comprehensive plan of care as evidenced by provider attestation and co-signature . Inter-disciplinary care team collaboration (see longitudinal plan of care) . Reviewed medications and discussed current treatment plan. Encouraged to continue taking medications as prescribed. Advised to notify Rheumatologist if unable to tolerate the prescribed regimen. Encouraged to keep the CCM Pharmacist updated with concerns regarding the costs of  Enbrel. . Discussed current level of function and options for in-home assistance. She is able to complete ADL's and function in the home independently. Family members are available to assist with transportation when needed. Encouraged to keep the care management team updated of needs. . Reviewed safety and fall prevention measures. Encouraged to continue using assistive devices as advised. Encouraged to consider options for a medical alert device since she currently lives alone. . Reviewed increased risk for infection r/t Rheumatoid Arthritis. Discussed importance of adhering to recommended strategies to reduce risk of infection. Reviewed worsening s/sx that require immediate medical attention.  Patient Goals/Self-Care Activities Over the next 120 days, patient will:   -Take medications as prescribed -Utilize assistive device appropriately with all ambulation -Change positions slowly -Demonstrate self and pet awareness at all times -Wear secure non-skid footwear with all ambulation -Keep pathways clear and well lit to prevent accidental falls -Complete labs and attend clinical visits with Rheumatologist as scheduled  Follow Up Plan:  Will follow-up next month      PLAN A member of the care management team will follow-up with Ms. Below next month.      [name] [facility] Care Management [facility] [phone]

## 2020-05-31 DIAGNOSIS — M8949 Other hypertrophic osteoarthropathy, multiple sites: Secondary | ICD-10-CM | POA: Diagnosis not present

## 2020-05-31 DIAGNOSIS — M47819 Spondylosis without myelopathy or radiculopathy, site unspecified: Secondary | ICD-10-CM | POA: Diagnosis not present

## 2020-05-31 DIAGNOSIS — Z79899 Other long term (current) drug therapy: Secondary | ICD-10-CM | POA: Diagnosis not present

## 2020-05-31 DIAGNOSIS — L409 Psoriasis, unspecified: Secondary | ICD-10-CM | POA: Diagnosis not present

## 2020-05-31 DIAGNOSIS — L4050 Arthropathic psoriasis, unspecified: Secondary | ICD-10-CM | POA: Diagnosis not present

## 2020-06-08 ENCOUNTER — Encounter: Payer: Self-pay | Admitting: Physician Assistant

## 2020-06-08 DIAGNOSIS — N3000 Acute cystitis without hematuria: Secondary | ICD-10-CM

## 2020-06-09 MED ORDER — CLINDAMYCIN HCL 300 MG PO CAPS: 300.0000 mg | ORAL_CAPSULE | Freq: Three times a day (TID) | ORAL | 0 refills | Status: DC

## 2020-06-12 ENCOUNTER — Other Ambulatory Visit: Payer: Self-pay | Admitting: Physician Assistant

## 2020-06-12 DIAGNOSIS — K2100 Gastro-esophageal reflux disease with esophagitis, without bleeding: Secondary | ICD-10-CM

## 2020-06-12 NOTE — Telephone Encounter (Signed)
Requested Prescriptions  Pending Prescriptions Disp Refills  . esomeprazole (NEXIUM) 40 MG capsule [Pharmacy Med Name: ESOMEPRAZOLE MAG DR 40 MG CAP] 90 capsule 1    Sig: TAKE 1 CAPSULE BY MOUTH EVERY DAY     Gastroenterology: Proton Pump Inhibitors Passed - 06/12/2020 11:05 AM      Passed - Valid encounter within last 12 months    Recent Outpatient Visits          1 month ago Type 2 diabetes mellitus with diabetic polyneuropathy, without long-term current use of insulin [facility])   [place], [place]   8 months ago Acute cystitis without hematuria   [place], [place]arnce [place], [place]   11 months ago Medicare annual wellness visit, subsequent   [place], [place], [place]   1 year ago Recurrent UTI   [facility] [place], Wendee [place], [place]   1 year ago Recurrent UTI   [facility], [place], [place]

## 2020-06-18 ENCOUNTER — Ambulatory Visit (INDEPENDENT_AMBULATORY_CARE_PROVIDER_SITE_OTHER): Payer: Medicare HMO

## 2020-06-18 DIAGNOSIS — M069 Rheumatoid arthritis, unspecified: Secondary | ICD-10-CM | POA: Diagnosis not present

## 2020-06-18 DIAGNOSIS — E1142 Type 2 diabetes mellitus with diabetic polyneuropathy: Secondary | ICD-10-CM | POA: Diagnosis not present

## 2020-06-18 NOTE — Chronic Care Management (AMB) (Signed)
Chronic Care Management   Follow Up Note   06/18/2020 Name: Jean Gonzales MRN: [id number] DOB: 01-01-1956  Primary Care Provider: Mar Daring, PA-C Reason for referral : Chronic Care Management   Jean Gonzales is a 65 y.o. year old female who is a primary care patient of Rubye Beach. She is currently enrolled in the chronic care management program.  Review of Jean Gonzales's status, including review of consultants reports, relevant labs and test results was conducted today. Collaboration with appropriate care team members was performed as part of the comprehensive evaluation and provision of chronic care management services.    SDOH (Social Determinants of Health) assessments performed: No    Outpatient Encounter Medications as of 06/18/2020  Medication Sig  . acetaminophen (TYLENOL) 325 MG tablet Take 1-2 tablets (325-650 mg total) by mouth every 6 (six) hours as needed for mild pain (pain score 1-3 or temp > 100.5).  Marland Kitchen aspirin EC 81 MG tablet Take 81 mg by mouth daily.  Marland Kitchen atorvastatin (LIPITOR) 40 MG tablet TAKE 1 TABLET BY MOUTH EVERY DAY IN THE MORNING  . calcium-vitamin D (OSCAL WITH D) 500-200 MG-UNIT tablet Take 1 tablet by mouth.  . celecoxib (CELEBREX) 100 MG capsule TAKE 1 CAPSULE BY MOUTH TWICE A DAY  . clindamycin (CLEOCIN) 300 MG capsule Take 1 capsule (300 mg total) by mouth 3 (three) times daily.  . diclofenac sodium (VOLTAREN) 1 % GEL Apply 2 g topically 4 (four) times daily. Apply to knee  . diclofenac Sodium (VOLTAREN) 1 % GEL Apply 2 g topically 4 (four) times daily.  Marland Kitchen esomeprazole (NEXIUM) 40 MG capsule TAKE 1 CAPSULE BY MOUTH EVERY DAY  . furosemide (LASIX) 20 MG tablet TAKE 1 TABLET BY MOUTH TWICE A DAY  . nystatin (MYCOSTATIN/NYSTOP) powder Apply topically 3 (three) times daily.  . ondansetron (ZOFRAN ODT) 4 MG disintegrating tablet Take 1 tablet (4 mg total) by mouth every 8 (eight) hours as needed for nausea or vomiting.  .  tamsulosin (FLOMAX) 0.4 MG CAPS capsule TAKE 1 CAPSULE BY MOUTH EVERY DAY  . triamcinolone ointment (KENALOG) 0.1 % APPLY TO AFFECTED AREA TWICE A DAY  . Turmeric, Curcuma Longa, (CURCUMIN) POWD by Does not apply route. With ginger and black seed  . zolpidem (AMBIEN) 10 MG tablet TAKE 1 TABLET BY MOUTH AT BEDTIME AS NEEDED FOR SLEEP.   No facility-administered encounter medications on file as of 06/18/2020.     Objective:  Patient Care Plan: Diabetes Type 2 (Adult)    Problem Identified: Glycemic Management (Diabetes, Type 2)     Long-Range Goal: Glycemic Management Optimized   Start Date: 04/06/2020  Expected End Date: 08/04/2020  Priority: High  Note:    Current Barriers:  . Chronic disease management and support related to Diabetes self-management  Case Manager Clinical Goal(s):  Marland Kitchen Over the next 120 days, patient will improve glycemic control and adhere to recommended carb modified diet.   Interventions:  . Collaboration with Mar Daring, PA-C regarding development and update of comprehensive plan of care as evidenced by provider attestation and co-signature . Inter-disciplinary care team collaboration (see longitudinal plan of care) . Discussed current plan for diabetes self management. She prefers to continue plan for diet modification vs medications. Attempting to adhere to a Keto diet. Encouraged to continue reading nutrition labels and adhering to a Keto/Carb modified diet. Encouraged to complete labs as scheduled to monitor A1C. Declined need for additional educational resources. . Discussed importance of completing  recommended DM preventive care.    Patient Goals/Self-Care Activities - Adhere to provider's recommendation for glycemic control. - Attend all scheduled provider appointments - Complete labs as recommended - Notify provider or care management team with health related questions and new concerns.   Follow Up Plan:  -Will follow up in two months    Patient Care Plan: Rheumatoid Arthritis    Problem Identified: Mobility and Function (Rheumatoid Arthritis)     Long-Range Goal: Maintain Mobility and Function   Start Date: 04/06/2020  Expected End Date: 08/04/2020  Priority: High  Note:   Current Barriers:  . Impaired physical mobility r/t Rheumatoid Arthritis.  Clinical Goal(s):  Over the next 120 days: . Patient will maintain ability to function with absence/limitation of contractures. . Patient will not experience falls.  Interventions:  . Collaboration with Mar Daring, PA-C regarding development and update of comprehensive plan of care as evidenced by provider attestation and co-signature . Inter-disciplinary care team collaboration (see longitudinal plan of care) . Reviewed medications and current treatment plan. Enbrel was previously prescribed but changed to Humira due to intolerance. Reports tolerating Humira well but does not feel that it's as effective. She plans to discuss other medication options during her next Rheumatology visit. Encouraged to hold Humira as advised by the Rheumatology team until she completes the prescribed course of antibiotics. Encouraged to keep the care team updated of concerns regarding prescription cost. . Discussed current level of function. Continues to experience discomfort when ambulating but able to complete ADL's independently  Declines need for additional assistance in the home. Family members are still available to assist with transportation and tasks in/around the home.  . Reviewed safety and fall prevention measures. Encouraged to continue using assistive devices as advised.  . Reviewed increased risk for infection r/t Rheumatoid Arthritis. Discussed importance of adhering to recommended strategies to reduce risk of infection. Reviewed worsening s/sx that require immediate medical attention.  Patient Goals/Self-Care Activities -Take medications as prescribed -Utilize assistive  device appropriately with all ambulation -Change positions slowly -Demonstrate self and pet awareness at all times -Wear secure non-skid footwear with all ambulation -Keep pathways clear and well lit to prevent accidental falls -Complete labs and attend clinical visits with Rheumatologist as scheduled  Follow Up Plan:  Will follow-up next month       PLAN A member of the care team will follow up next month.   [name] [facility] Care Management [facility] [phone]

## 2020-06-27 NOTE — Patient Instructions (Signed)
Thank you for allowing the Chronic Care Management team to participate in your care. It was a pleasure speaking to you. Please feel free to contact me with questions.    Goals Addressed: Patient Care Plan: Diabetes Type 2 (Adult)    Problem Identified: Glycemic Management (Diabetes, Type 2)     Long-Range Goal: Glycemic Management Optimized   Start Date: 04/06/2020  Expected End Date: 08/04/2020  Priority: High  Note:    Current Barriers:  . Chronic disease management and support related to Diabetes self-management  Case Manager Clinical Goal(s):  Marland Kitchen Over the next 120 days, patient will improve glycemic control and adhere to recommended carb modified diet.   Interventions:  . Collaboration with Mar Daring, PA-C regarding development and update of comprehensive plan of care as evidenced by provider attestation and co-signature . Inter-disciplinary care team collaboration (see longitudinal plan of care) . Discussed current plan for diabetes self management. She prefers to continue plan for diet modification vs medications. Attempting to adhere to a Keto diet. Encouraged to continue reading nutrition labels and adhering to a Keto/Carb modified diet. Encouraged to complete labs as scheduled to monitor A1C. Declined need for additional educational resources. . Discussed importance of completing recommended DM preventive care.    Patient Goals/Self-Care Activities - Adhere to provider's recommendation for glycemic control. - Attend all scheduled provider appointments - Complete labs as recommended - Notify provider or care management team with health related questions and new concerns.   Follow Up Plan:  -Will follow up in two months   Patient Care Plan: Rheumatoid Arthritis    Problem Identified: Mobility and Function (Rheumatoid Arthritis)     Long-Range Goal: Maintain Mobility and Function   Start Date: 04/06/2020  Expected End Date: 08/04/2020  Priority: High   Note:   Current Barriers:  . Impaired physical mobility r/t Rheumatoid Arthritis.  Clinical Goal(s):  Over the next 120 days: . Patient will maintain ability to function with absence/limitation of contractures. . Patient will not experience falls.  Interventions:  . Collaboration with Mar Daring, PA-C regarding development and update of comprehensive plan of care as evidenced by provider attestation and co-signature . Inter-disciplinary care team collaboration (see longitudinal plan of care) . Reviewed medications and current treatment plan. Enbrel was previously prescribed but changed to Humira due to intolerance. Reports tolerating Humira well but does not feel that it's as effective. She plans to discuss other medication options during her next Rheumatology visit. Encouraged to hold Humira as advised by the Rheumatology team until she completes the prescribed course of antibiotics. Encouraged to keep the care team updated of concerns regarding prescription cost. . Discussed current level of function. Continues to experience discomfort when ambulating but able to complete ADL's independently  Declines need for additional assistance in the home. Family members are still available to assist with transportation and tasks in/around the home.  . Reviewed safety and fall prevention measures. Encouraged to continue using assistive devices as advised.  . Reviewed increased risk for infection r/t Rheumatoid Arthritis. Discussed importance of adhering to recommended strategies to reduce risk of infection. Reviewed worsening s/sx that require immediate medical attention.  Patient Goals/Self-Care Activities -Take medications as prescribed -Utilize assistive device appropriately with all ambulation -Change positions slowly -Demonstrate self and pet awareness at all times -Wear secure non-skid footwear with all ambulation -Keep pathways clear and well lit to prevent accidental falls -Complete  labs and attend clinical visits with Rheumatologist as scheduled  Follow Up Plan:  Will follow-up next month                Jean Gonzales verbalized understanding of the information discussed during the telephonic outreach today. Declined need for mailed/printed instructions. A member of the care team will follow up next month.   [name] [facility] Care Management [facility] [phone]

## 2020-06-28 ENCOUNTER — Ambulatory Visit (INDEPENDENT_AMBULATORY_CARE_PROVIDER_SITE_OTHER): Payer: Medicare HMO | Admitting: Family Medicine

## 2020-06-28 ENCOUNTER — Other Ambulatory Visit: Payer: Self-pay

## 2020-06-28 ENCOUNTER — Encounter: Payer: Self-pay | Admitting: Family Medicine

## 2020-06-28 VITALS — BP 157/85 | HR 83 | Temp 98.2°F | Wt 241.0 lb

## 2020-06-28 DIAGNOSIS — E1142 Type 2 diabetes mellitus with diabetic polyneuropathy: Secondary | ICD-10-CM | POA: Diagnosis not present

## 2020-06-28 DIAGNOSIS — E1169 Type 2 diabetes mellitus with other specified complication: Secondary | ICD-10-CM

## 2020-06-28 DIAGNOSIS — E785 Hyperlipidemia, unspecified: Secondary | ICD-10-CM

## 2020-06-28 DIAGNOSIS — M459 Ankylosing spondylitis of unspecified sites in spine: Secondary | ICD-10-CM | POA: Insufficient documentation

## 2020-06-28 DIAGNOSIS — L4050 Arthropathic psoriasis, unspecified: Secondary | ICD-10-CM

## 2020-06-28 DIAGNOSIS — M069 Rheumatoid arthritis, unspecified: Secondary | ICD-10-CM | POA: Diagnosis not present

## 2020-06-28 DIAGNOSIS — R600 Localized edema: Secondary | ICD-10-CM | POA: Insufficient documentation

## 2020-06-28 DIAGNOSIS — N3000 Acute cystitis without hematuria: Secondary | ICD-10-CM

## 2020-06-28 MED ORDER — CEPHALEXIN 500 MG PO CAPS: 500.0000 mg | ORAL_CAPSULE | Freq: Three times a day (TID) | ORAL | 0 refills | Status: AC

## 2020-06-28 NOTE — Assessment & Plan Note (Signed)
Followed by rheumatology Currently on humira Did not tolerate Enbrel

## 2020-06-28 NOTE — Progress Notes (Signed)
Established patient visit   Patient: Jean Gonzales   DOB: May 06, 1955   65 y.o. Female  MRN: [id number] Visit Date: 06/28/2020  Today's healthcare provider: Lavon Paganini, MD   Chief Complaint  Patient presents with  . Joint Swelling    Ankle   I,Jean Gonzales,acting as a scribe for Lavon Paganini, MD.,have documented all relevant documentation on the behalf of Lavon Paganini, MD,as directed by  Lavon Paganini, MD while in the presence of Lavon Paganini, MD. Subjective    HPI Ankle Swelling Patient presents today for bilateral ankle swelling for about 2 days now. She reports taking her Lasix as prescribed.    Urinary anastomotic stricture - urinary catheter 3 times daily  Feels like she has UTI. Took Clinda 3 weeks ago without relief.   Medications: Outpatient Medications Prior to Visit  Medication Sig  . acetaminophen (TYLENOL) 325 MG tablet Take 1-2 tablets (325-650 mg total) by mouth every 6 (six) hours as needed for mild pain (pain score 1-3 or temp > 100.5).  Marland Kitchen atorvastatin (LIPITOR) 40 MG tablet TAKE 1 TABLET BY MOUTH EVERY DAY IN THE MORNING  . celecoxib (CELEBREX) 100 MG capsule TAKE 1 CAPSULE BY MOUTH TWICE A DAY  . diclofenac sodium (VOLTAREN) 1 % GEL Apply 2 g topically 4 (four) times daily. Apply to knee  . esomeprazole (NEXIUM) 40 MG capsule TAKE 1 CAPSULE BY MOUTH EVERY DAY  . furosemide (LASIX) 20 MG tablet TAKE 1 TABLET BY MOUTH TWICE A DAY  . nystatin (MYCOSTATIN/NYSTOP) powder Apply topically 3 (three) times daily.  . ondansetron (ZOFRAN ODT) 4 MG disintegrating tablet Take 1 tablet (4 mg total) by mouth every 8 (eight) hours as needed for nausea or vomiting.  . tamsulosin (FLOMAX) 0.4 MG CAPS capsule TAKE 1 CAPSULE BY MOUTH EVERY DAY  . triamcinolone ointment (KENALOG) 0.1 % APPLY TO AFFECTED AREA TWICE A DAY  . Turmeric, Curcuma Longa, (CURCUMIN) POWD by Does not apply route. With ginger and black seed  . zolpidem (AMBIEN) 10  MG tablet TAKE 1 TABLET BY MOUTH AT BEDTIME AS NEEDED FOR SLEEP.  . [DISCONTINUED] clindamycin (CLEOCIN) 300 MG capsule Take 1 capsule (300 mg total) by mouth 3 (three) times daily. (Patient not taking: Reported on 07/14/2020)  . [DISCONTINUED] aspirin EC 81 MG tablet Take 81 mg by mouth daily. (Patient not taking: Reported on 06/28/2020)  . [DISCONTINUED] calcium-vitamin D (OSCAL WITH D) 500-200 MG-UNIT tablet Take 1 tablet by mouth. (Patient not taking: Reported on 06/28/2020)  . [DISCONTINUED] diclofenac Sodium (VOLTAREN) 1 % GEL Apply 2 g topically 4 (four) times daily. (Patient not taking: Reported on 06/28/2020)   No facility-administered medications prior to visit.    Review of Systems     Objective    BP (!) 157/85 (BP Location: Left Arm, Patient Position: Sitting, Cuff Size: Large)   Pulse 83   Temp 98.2 F (36.8 C) (Oral)   Wt 241 lb (109.3 kg)   SpO2 99%   BMI 37.75 kg/m     Physical Exam Constitutional:      General: She is not in acute distress.    Appearance: Normal appearance. She is not diaphoretic.  HENT:     Head: Normocephalic.  Eyes:     Conjunctiva/sclera: Conjunctivae normal.  Cardiovascular:     Rate and Rhythm: Normal rate and regular rhythm.     Pulses: Normal pulses.     Heart sounds: Normal heart sounds. No murmur heard.   Pulmonary:  Effort: Pulmonary effort is normal. No respiratory distress.     Breath sounds: Normal breath sounds. No wheezing or rhonchi.  Abdominal:     General: There is no distension.     Palpations: Abdomen is soft.     Tenderness: There is no abdominal tenderness.  Musculoskeletal:     Right lower leg: Edema present.     Left lower leg: Edema present.  Skin:    General: Skin is warm and dry.     Findings: No rash.  Neurological:     Mental Status: She is alert and oriented to person, place, and time. Mental status is at baseline.  Psychiatric:        Mood and Affect: Mood normal.        Behavior: Behavior normal.       Results for orders placed or performed in visit on 06/28/20  Urine Culture   Specimen: Urine   Urine  Result Value Ref Range   Urine Culture, Routine Final report (A)    Organism ID, Bacteria Klebsiella pneumoniae (A)    Antimicrobial Susceptibility Comment     Assessment & Plan     Problem List Items Addressed This Visit      Endocrine   Diabetes mellitus ([place])    Reviewed last A1c Not on any medications-diet controlled      Hyperlipidemia associated with type 2 diabetes mellitus ([place])    Reviewed last lipid panel Continue statin Goal LDL less than 70        Musculoskeletal and Integument   Rheumatoid arthritis ([place])    Followed by rheumatology Currently on humira Did not tolerate Enbrel      Psoriatic arthritis ([place])    Followed by rheumatology Currently on humira Did not tolerate Enbrel      Ankylosing spondylitis ([place])    Followed by rheumatology Currently on humira Did not tolerate Enbrel        Other   Morbid obesity ([place])    BMI of 37 and associated with T2DM, HLD Discussed importance of healthy weight management Discussed diet and exercise       Bilateral edema of lower extremity    No red flags Could be related to rheumatoid arthritis versus morbid obesity versus venous stasis Will try elevation and compression Return precautions discussed       Other Visit Diagnoses    Acute cystitis without hematuria    -  Primary   Relevant Orders   Urine Culture (Completed)     - Symptoms consistent with UTI -Unable to do point-of-care UA as she brings sample from home and nonsterile container, but will attempt to get you urine culture -No systemic symptoms or signs of pyelonephritis -Will start treatment with 5 day course of Keflex after reviewing previous urine culture -We will send urine culture to confirm sensitivities -Discussed return precautions   Return in about 4 weeks (around 07/26/2020) for chronic disease f/u.      I, Lavon Paganini, MD, have reviewed all documentation for this visit. The documentation on 07/20/20 for the exam, diagnosis, procedures, and orders are all accurate and complete.   [name], Dionne Bucy, MD, MPH [facility]

## 2020-07-03 LAB — URINE CULTURE

## 2020-07-13 ENCOUNTER — Encounter: Payer: Self-pay | Admitting: Family Medicine

## 2020-07-14 ENCOUNTER — Ambulatory Visit (INDEPENDENT_AMBULATORY_CARE_PROVIDER_SITE_OTHER): Payer: Medicare HMO | Admitting: Family Medicine

## 2020-07-14 ENCOUNTER — Other Ambulatory Visit: Payer: Self-pay

## 2020-07-14 ENCOUNTER — Encounter: Payer: Self-pay | Admitting: Family Medicine

## 2020-07-14 DIAGNOSIS — N3000 Acute cystitis without hematuria: Secondary | ICD-10-CM | POA: Diagnosis not present

## 2020-07-14 LAB — POCT URINALYSIS DIPSTICK
Bilirubin, UA: NEGATIVE
Glucose, UA: NEGATIVE
Ketones, UA: NEGATIVE
Nitrite, UA: NEGATIVE
Protein, UA: POSITIVE — AB
Spec Grav, UA: 1.01 (ref 1.010–1.025)
Urobilinogen, UA: 0.2 E.U./dL
pH, UA: 6.5 (ref 5.0–8.0)

## 2020-07-14 MED ORDER — LEVOFLOXACIN 750 MG PO TABS: 750.0000 mg | ORAL_TABLET | Freq: Every day | ORAL | 0 refills | Status: AC

## 2020-07-14 NOTE — Patient Instructions (Signed)
Urinary Tract Infection, Adult A urinary tract infection (UTI) is an infection of any part of the urinary tract. The urinary tract includes:  The kidneys.  The ureters.  The bladder.  The urethra. These organs make, store, and get rid of pee (urine) in the body. What are the causes? This infection is caused by germs (bacteria) in your genital area. These germs grow and cause swelling (inflammation) of your urinary tract. What increases the risk? The following factors may make you more likely to develop this condition:  Using a small, thin tube (catheter) to drain pee.  Not being able to control when you pee or poop (incontinence).  Being female. If you are female, these things can increase the risk: ? Using these methods to prevent pregnancy:  A medicine that kills sperm (spermicide).  A device that blocks sperm (diaphragm). ? Having low levels of a female hormone (estrogen). ? Being pregnant. You are more likely to develop this condition if:  You have genes that add to your risk.  You are sexually active.  You take antibiotic medicines.  You have trouble peeing because of: ? A prostate that is bigger than normal, if you are female. ? A blockage in the part of your body that drains pee from the bladder. ? A kidney stone. ? A nerve condition that affects your bladder. ? Not getting enough to drink. ? Not peeing often enough.  You have other conditions, such as: ? Diabetes. ? A weak disease-fighting system (immune system). ? Sickle cell disease. ? Gout. ? Injury of the spine. What are the signs or symptoms? Symptoms of this condition include:  Needing to pee right away.  Peeing small amounts often.  Pain or burning when peeing.  Blood in the pee.  Pee that smells bad or not like normal.  Trouble peeing.  Pee that is cloudy.  Fluid coming from the vagina, if you are female.  Pain in the belly or lower back. Other symptoms include:  Vomiting.  Not  feeling hungry.  Feeling mixed up (confused). This may be the first symptom in older adults.  Being tired and grouchy (irritable).  A fever.  Watery poop (diarrhea). How is this treated?  Taking antibiotic medicine.  Taking other medicines.  Drinking enough water. In some cases, you may need to see a specialist. Follow these instructions at home: Medicines  Take over-the-counter and prescription medicines only as told by your doctor.  If you were prescribed an antibiotic medicine, take it as told by your doctor. Do not stop taking it even if you start to feel better. General instructions  Make sure you: ? Pee until your bladder is empty. ? Do not hold pee for a long time. ? Empty your bladder after sex. ? Wipe from front to back after peeing or pooping if you are a female. Use each tissue one time when you wipe.  Drink enough fluid to keep your pee pale yellow.  Keep all follow-up visits.   Contact a doctor if:  You do not get better after 1-2 days.  Your symptoms go away and then come back. Get help right away if:  You have very bad back pain.  You have very bad pain in your lower belly.  You have a fever.  You have chills.  You feeling like you will vomit or you vomit. Summary  A urinary tract infection (UTI) is an infection of any part of the urinary tract.  This condition is caused by   germs in your genital area.  There are many risk factors for a UTI.  Treatment includes antibiotic medicines.  Drink enough fluid to keep your pee pale yellow. This information is not intended to replace advice given to you by your health care provider. Make sure you discuss any questions you have with your health care provider. Document Revised: 10/24/2019 Document Reviewed: 10/24/2019 Elsevier Patient Education  2021 [employer]  

## 2020-07-14 NOTE — Addendum Note (Signed)
Addended by: Ashley Royalty E on: 07/14/2020 04:55 PM   Modules accepted: Orders

## 2020-07-14 NOTE — Progress Notes (Signed)
Established patient visit   Patient: Jean Gonzales   DOB: 01/23/1956   65 y.o. Female  MRN: [id number] Visit Date: 07/14/2020  Today's healthcare provider: Laurita Quint Amandeep Nesmith, FNP   Chief Complaint  Patient presents with  . Urinary Tract Infection   Subjective    Urinary Tract Infection  This is a chronic problem. The current episode started in the past 7 days. The problem has been gradually worsening. Associated symptoms include frequency. Pertinent negatives include no hematuria, nausea, urgency or vomiting. Her past medical history is significant for catheterization and recurrent UTIs.    Last UTI was 06/28/20: treated with clinda at that time  BP Readings from Last 3 Encounters:  07/14/20 (!) 168/117  06/28/20 (!) 157/85  04/21/20 (!) 154/82     Patient Active Problem List   Diagnosis Date Noted  . Ankylosing spondylitis ([place]) 06/28/2020  . Bilateral edema of lower extremity 06/28/2020  . Neuropathy 10/06/2019  . Morbid obesity ([place]) 10/06/2019  . Chest pain 09/29/2017  . History of total knee arthroplasty 08/01/2017  . Osteoarthritis of right knee 07/09/2017  . Osteoarthritis 05/07/2017  . Synovitis of knee 12/29/2016  . Knee joint effusion 12/29/2016  . Malignant neoplasm of endometrium ([place]) 02/22/2015  . Acid reflux 02/22/2015  . History of migraine headaches 02/22/2015  . Hyperlipidemia associated with type 2 diabetes mellitus ([place]) 02/22/2015  . BP (high blood pressure) 02/22/2015  . History of colon polyps 02/22/2015  . Urinary incontinence 02/22/2015  . Psoriatic arthritis ([place]) 02/22/2015  . Diabetes mellitus ([place]) 10/06/2014  . Urethral meatal stenosis 12/03/2013  . Rheumatoid arthritis ([place]) 03/14/2013   Past Medical History:  Diagnosis Date  . Ankylosing spondylitis ([place])   . Arthritis    osteoarthritis right knee  . Cancer [facility]) 2014   endometrial CA  . Diabetes mellitus without complication (HCC)    diet controlled  . Endometrial  cancer ([place])   . GERD (gastroesophageal reflux disease)   . Hyperlipidemia   . Hypertension    improved after ca tx   Social History   Tobacco Use  . Smoking status: Never Smoker  . Smokeless tobacco: Never Used  Vaping Use  . Vaping Use: Never used  Substance Use Topics  . Alcohol use: No    Alcohol/week: 0.0 standard drinks  . Drug use: No   Allergies  Allergen Reactions  . Iodinated Diagnostic Agents Anaphylaxis  . Bactrim [Sulfamethoxazole-Trimethoprim] Itching and Rash     Medications: Outpatient Medications Prior to Visit  Medication Sig  . acetaminophen (TYLENOL) 325 MG tablet Take 1-2 tablets (325-650 mg total) by mouth every 6 (six) hours as needed for mild pain (pain score 1-3 or temp > 100.5).  Marland Kitchen atorvastatin (LIPITOR) 40 MG tablet TAKE 1 TABLET BY MOUTH EVERY DAY IN THE MORNING  . celecoxib (CELEBREX) 100 MG capsule TAKE 1 CAPSULE BY MOUTH TWICE A DAY  . diclofenac sodium (VOLTAREN) 1 % GEL Apply 2 g topically 4 (four) times daily. Apply to knee  . esomeprazole (NEXIUM) 40 MG capsule TAKE 1 CAPSULE BY MOUTH EVERY DAY  . furosemide (LASIX) 20 MG tablet TAKE 1 TABLET BY MOUTH TWICE A DAY  . nystatin (MYCOSTATIN/NYSTOP) powder Apply topically 3 (three) times daily.  . ondansetron (ZOFRAN ODT) 4 MG disintegrating tablet Take 1 tablet (4 mg total) by mouth every 8 (eight) hours as needed for nausea or vomiting.  . tamsulosin (FLOMAX) 0.4 MG CAPS capsule TAKE 1 CAPSULE BY MOUTH EVERY DAY  .  triamcinolone ointment (KENALOG) 0.1 % APPLY TO AFFECTED AREA TWICE A DAY  . Turmeric, Curcuma Longa, (CURCUMIN) POWD by Does not apply route. With ginger and black seed  . zolpidem (AMBIEN) 10 MG tablet TAKE 1 TABLET BY MOUTH AT BEDTIME AS NEEDED FOR SLEEP.  . [DISCONTINUED] clindamycin (CLEOCIN) 300 MG capsule Take 1 capsule (300 mg total) by mouth 3 (three) times daily. (Patient not taking: Reported on 07/14/2020)   No facility-administered medications prior to visit.    Review  of Systems  Constitutional: Negative.   Gastrointestinal: Positive for diarrhea (Chronic issues). Negative for abdominal distention, abdominal pain, anal bleeding, blood in stool, constipation, nausea, rectal pain and vomiting.  Genitourinary: Positive for frequency. Negative for decreased urine volume, dysuria, hematuria, urgency, vaginal bleeding, vaginal discharge and vaginal pain.        Objective    BP (!) 168/117 (BP Location: Left Arm, Patient Position: Sitting, Cuff Size: Large)   Pulse 71   Temp 98.3 F (36.8 C) (Oral)   Wt 237 lb (107.5 kg)   BMI 37.12 kg/m    Physical Exam Constitutional:      General: She is not in acute distress.    Appearance: Normal appearance. She is not ill-appearing.  HENT:     Head: Normocephalic.  Cardiovascular:     Rate and Rhythm: Normal rate and regular rhythm.     Pulses: Normal pulses.     Heart sounds: Normal heart sounds. No murmur heard. No friction rub. No gallop.   Pulmonary:     Effort: Pulmonary effort is normal. No respiratory distress.     Breath sounds: Normal breath sounds. No stridor. No wheezing, rhonchi or rales.  Abdominal:     General: Bowel sounds are normal. There is no distension.     Palpations: Abdomen is soft.     Tenderness: There is no abdominal tenderness. There is no right CVA tenderness, left CVA tenderness or guarding.  Musculoskeletal:     Right lower leg: No edema.     Left lower leg: No edema.  Skin:    General: Skin is warm and dry.  Neurological:     Mental Status: She is alert and oriented to person, place, and time.  Psychiatric:        Mood and Affect: Mood normal.        Behavior: Behavior normal.       No results found for any visits on 07/14/20.  Assessment & Plan     Problem List Items Addressed This Visit   None   Visit Diagnoses    Acute cystitis without hematuria       Relevant Medications   levofloxacin (LEVAQUIN) 750 MG tablet   Other Relevant Orders   CULTURE, URINE  COMPREHENSIVE   Urinalysis, microscopic only      ASSESSMENT: UTI uncomplicated without evidence of pyelonephritis  PLAN: Treatment per orders - also push fluids, may use Pyridium OTC prn. Call or return to clinic prn if these symptoms worsen or fail to improve as anticipated.   Return if symptoms worsen or fail to improve, for Next scheduled appointment in May.       [place], [place] [phone] (phone) [phone] (fax)  [place]

## 2020-07-19 LAB — CULTURE, URINE COMPREHENSIVE

## 2020-07-19 LAB — URINALYSIS, MICROSCOPIC ONLY
Bacteria, UA: NONE SEEN
Casts: NONE SEEN /lpf
Epithelial Cells (non renal): NONE SEEN /hpf (ref 0–10)
WBC, UA: >30 /hpf — AB (ref 0–5)

## 2020-07-20 NOTE — Assessment & Plan Note (Signed)
No red flags Could be related to rheumatoid arthritis versus morbid obesity versus venous stasis Will try elevation and compression Return precautions discussed

## 2020-07-20 NOTE — Assessment & Plan Note (Signed)
Reviewed last lipid panel Continue statin Goal LDL less than 70

## 2020-07-20 NOTE — Assessment & Plan Note (Signed)
BMI of 37 and associated with T2DM, HLD Discussed importance of healthy weight management Discussed diet and exercise

## 2020-07-20 NOTE — Assessment & Plan Note (Signed)
Reviewed last A1c Not on any medications-diet controlled

## 2020-07-27 ENCOUNTER — Encounter: Payer: Self-pay | Admitting: Family Medicine

## 2020-07-27 NOTE — Telephone Encounter (Signed)
Dark urine means that she needs to hydrate. Maybe she should see Urology.

## 2020-07-28 NOTE — Progress Notes (Signed)
07/08/19 02:30 PM  Jean Gonzales [birth year] [id number]  Referring provider: Mar Daring, PA-C [address] [place],  [place] [address]  Chief Complaint  Patient presents with  . Urinary Incontinence   Urological history: 1. Urinary retention -managed with CIC - recommended to CIC three times daily  2. rUTI's -contributing factors of age, vaginal atrophy, diarrhea, immunosuppressants and incomplete bladder emptying -documented positive urine cultures over the last year  E. coli resistant to ampicillin, cefuroxime, tetracycline and trimethoprim/sulfa and Klebsiella pneumoniae resistant to ampicillin, tetracycline and trimethoprim/sulfa on July 14, 2020  Klebsiella pneumoniae resistant to ampicillin and trimethoprim/sulfa June 28, 2020  Pansensitive E. coli on March 09, 2020  E. coli resistant to ampicillin, tetracycline and trimethoprim/sulfa on October 06, 2019  -non-contrast CT 02/2020 Mildly thick-walled appearance of the urinary bladder, question cystitis.  3. Vaginal atrophy -has a history of endometrial carcinoma   4. Urethral stenosis -secondary to pelvic surgery for endometrial cancer -managed with CIC   HPI: Jean Gonzales is a 65 y.o. female who presents today for follow up.  She is self cathing 3-4 times daily using the closed self cath system.  She drinks about 10 bottles of water daily.  She is having about 1000 cc output daily.  She is also saying dark urine which is concerning to her as she does try to keep her self well-hydrated.  She also had an incident where she passed a blood clot in her urine.  She is experiencing frequency, urgency, malodorous and cloudy urine.  She is having associated back pain and lower abdominal pain.  Her UA today is nitrite positive, greater than 30 WBCs and many bacteria.  Her PVR is 230 mL  PMH: Past Medical History:  Diagnosis Date  . Ankylosing spondylitis ([place])   . Arthritis     osteoarthritis right knee  . Cancer [facility]) 2014   endometrial CA  . Diabetes mellitus without complication (HCC)    diet controlled  . Endometrial cancer ([place])   . GERD (gastroesophageal reflux disease)   . Hyperlipidemia   . Hypertension    improved after ca tx    Surgical History: Past Surgical History:  Procedure Laterality Date  . ABDOMINAL HYSTERECTOMY  2015   cancer  . BREAST BIOPSY Right 2008ish   core  . CATARACT EXTRACTION W/PHACO Right 02/05/2018   Procedure: CATARACT EXTRACTION PHACO AND INTRAOCULAR LENS PLACEMENT ([place]) RIGHT DIABETES IVA TOPICAL;  Surgeon: Eulogio Bear, MD;  Location: [facility];  Service: Ophthalmology;  Laterality: Right;  . CATARACT EXTRACTION W/PHACO Left 02/25/2018   Procedure: CATARACT EXTRACTION PHACO AND INTRAOCULAR LENS PLACEMENT ([place]) LEFT DIABETIC;  Surgeon: Eulogio Bear, MD;  Location: [facility];  Service: Ophthalmology;  Laterality: Left;  . CHOLECYSTECTOMY  1992  . COLONOSCOPY WITH PROPOFOL N/A 08/08/2019   Procedure: COLONOSCOPY WITH PROPOFOL;  Surgeon: Jonathon Bellows, MD;  [facility] END[facility]COPY;  Service: Gastroenterology;  Laterality: N/A;  . EYE SURGERY    . TONSILLECTOMY  1973  . TONSILLECTOMY    . TOTAL KNEE ARTHROPLASTY Right 07/09/2017   Procedure: TOTAL KNEE ARTHROPLASTY;  Surgeon: Lovell Sheehan, MD;  Location: [facility] ORS;  Service: Orthopedics;  Laterality: Right;    Home Medications:  Allergies as of 07/29/2020      Reactions   Iodinated Diagnostic Agents Anaphylaxis   Bactrim [sulfamethoxazole-trimethoprim] Itching, Rash      Medication List       Accurate as of Jul 29, 2020 11:59  PM. If you have any questions, ask your nurse or doctor.        acetaminophen 325 MG tablet Commonly known as: TYLENOL Take 1-2 tablets (325-650 mg total) by mouth every 6 (six) hours as needed for mild pain (pain score 1-3 or temp > 100.5).   atorvastatin 40 MG tablet Commonly known as: LIPITOR TAKE 1 TABLET  BY MOUTH EVERY DAY IN THE MORNING   celecoxib 100 MG capsule Commonly known as: CELEBREX TAKE 1 CAPSULE BY MOUTH TWICE A DAY   Curcumin Powd by Does not apply route. With ginger and black seed   diclofenac sodium 1 % Gel Commonly known as: VOLTAREN Apply 2 g topically 4 (four) times daily. Apply to knee   esomeprazole 40 MG capsule Commonly known as: NEXIUM TAKE 1 CAPSULE BY MOUTH EVERY DAY   furosemide 20 MG tablet Commonly known as: LASIX TAKE 1 TABLET BY MOUTH TWICE A DAY   nystatin powder Commonly known as: MYCOSTATIN/NYSTOP Apply topically 3 (three) times daily.   ondansetron 4 MG disintegrating tablet Commonly known as: Zofran ODT Take 1 tablet (4 mg total) by mouth every 8 (eight) hours as needed for nausea or vomiting.   tamsulosin 0.4 MG Caps capsule Commonly known as: FLOMAX TAKE 1 CAPSULE BY MOUTH EVERY DAY   triamcinolone ointment 0.1 % Commonly known as: KENALOG APPLY TO AFFECTED AREA TWICE A DAY   zolpidem 10 MG tablet Commonly known as: AMBIEN TAKE 1 TABLET BY MOUTH AT BEDTIME AS NEEDED FOR SLEEP.       Allergies:  Allergies  Allergen Reactions  . Iodinated Diagnostic Agents Anaphylaxis  . Bactrim [Sulfamethoxazole-Trimethoprim] Itching and Rash    Family History: Family History  Problem Relation Age of Onset  . Diabetes Mother   . Cancer Mother   . Heart disease Father   . Breast cancer Neg Hx   . Kidney cancer Neg Hx   . Bladder Cancer Neg Hx     Social History:  reports that she has never smoked. She has never used smokeless tobacco. She reports that she does not drink alcohol and does not use drugs.   Physical Exam: BP (!) 168/101   Pulse 69   Ht 5\' 7"  (1.702 m)   Wt 237 lb (107.5 kg)   BMI 37.12 kg/m   Constitutional:  Well nourished. Alert and oriented, No acute distress. HEENT: [place] AT, mask in place.  Trachea midline Cardiovascular: No clubbing, cyanosis, or edema. Respiratory: Normal respiratory effort, no increased work  of breathing. Neurologic: Grossly intact, no focal deficits, moving all 4 extremities. Psychiatric: Normal mood and affect.   Laboratory Data: Lab Results  Component Value Date   WBC 16.0 (H) 03/09/2020   HGB 13.6 03/09/2020   HCT 39.3 03/09/2020   MCV 87.1 03/09/2020   PLT 154 03/09/2020    Lab Results  Component Value Date   CREATININE 0.90 08/12/2020    Lab Results  Component Value Date   HGBA1C 8.4 (A) 08/12/2020   Urinalysis Component     Latest Ref Rng & Units 07/29/2020  Specific Gravity, UA     1.005 - 1.030 1.010  pH, UA     5.0 - 7.5 5.5  Color, UA     Yellow Yellow  Appearance Ur     Clear Cloudy (A)  Leukocytes,UA     Negative 2+ (A)  Protein,UA     Negative/Trace Negative  Glucose, UA     Negative Negative  Ketones, UA  Negative Negative  RBC, UA     Negative Trace (A)  Bilirubin, UA     Negative Negative  Urobilinogen, Ur     0.2 - 1.0 mg/dL 0.2  Nitrite, UA     Negative Positive (A)  Microscopic Examination      See below:   Component     Latest Ref Rng & Units 07/29/2020  WBC, UA     0 - 5 /hpf >30 (A)  RBC     0 - 2 /hpf 0-2  Epithelial Cells (non renal)     0 - 10 /hpf 0-10  Bacteria, UA     None seen/Few Many (A)  I have reviewed the labs.  Assessment & Plan:    1. Blood clot in urine -UA -urine culture-will wait on culture to prescribe antibiotic -patient is not wanting to have a contrast study as she anaphylaxis with the contrast -she will proceed with the cystoscopy at this time -I have explained to the patient that they will  be scheduled for a cystoscopy in our office to evaluate their bladder.  The cystoscopy consists of passing a tube with a lens up through their urethra and into their urinary bladder.   We will inject the urethra with a lidocaine gel prior to introducing the cystoscope to help with any discomfort during the procedure.   After the procedure, they might experience blood in the urine and discomfort with  urination.  This will abate after the first few voids.  I have  encouraged the patient to increase water intake  during this time.  Patient denies any allergies to lidocaine.   2. Incomplete bladder emptying/retention -continue CIC 3 to 4 times daily -discussed slightly decreasing her liquid intake  3. rUTI's -contributing factors of high residuals, vaginal atrophy, immunosuppressants and CIC -does not want to use estrogen based creams due to history of endometrial cancer    Return for cysto for gross hemturia/rUTI's.  [name], PA-C  [facility] [address] [place], [place] [address] [phone]

## 2020-07-29 ENCOUNTER — Encounter: Payer: Self-pay | Admitting: Urology

## 2020-07-29 ENCOUNTER — Other Ambulatory Visit: Payer: Self-pay

## 2020-07-29 ENCOUNTER — Ambulatory Visit (INDEPENDENT_AMBULATORY_CARE_PROVIDER_SITE_OTHER): Payer: Medicare HMO | Admitting: Urology

## 2020-07-29 VITALS — BP 168/101 | HR 69 | Ht 67.0 in | Wt 237.0 lb

## 2020-07-29 DIAGNOSIS — R339 Retention of urine, unspecified: Secondary | ICD-10-CM

## 2020-07-29 DIAGNOSIS — N390 Urinary tract infection, site not specified: Secondary | ICD-10-CM

## 2020-07-29 LAB — BLADDER SCAN AMB NON-IMAGING

## 2020-07-29 NOTE — Telephone Encounter (Signed)
Needs to be seen and have urine sent off for culture. Can be done with urology, here if we have appts, UC, or maybe even [facility] virtual visit.

## 2020-07-30 LAB — URINALYSIS, COMPLETE
Bilirubin, UA: NEGATIVE
Glucose, UA: NEGATIVE
Ketones, UA: NEGATIVE
Nitrite, UA: POSITIVE — AB
Protein,UA: NEGATIVE
Specific Gravity, UA: 1.01 (ref 1.005–1.030)
Urobilinogen, Ur: 0.2 mg/dL (ref 0.2–1.0)
pH, UA: 5.5 (ref 5.0–7.5)

## 2020-07-30 LAB — MICROSCOPIC EXAMINATION: WBC, UA: >30 /hpf — AB (ref 0–5)

## 2020-08-04 LAB — CULTURE, URINE COMPREHENSIVE

## 2020-08-05 ENCOUNTER — Other Ambulatory Visit: Payer: Self-pay | Admitting: *Deleted

## 2020-08-05 MED ORDER — NITROFURANTOIN MONOHYD MACRO 100 MG PO CAPS: 100.0000 mg | ORAL_CAPSULE | Freq: Two times a day (BID) | ORAL | 0 refills | Status: AC

## 2020-08-12 ENCOUNTER — Encounter: Payer: Self-pay | Admitting: Family Medicine

## 2020-08-12 ENCOUNTER — Other Ambulatory Visit: Payer: Self-pay

## 2020-08-12 ENCOUNTER — Ambulatory Visit (INDEPENDENT_AMBULATORY_CARE_PROVIDER_SITE_OTHER): Payer: Medicare HMO | Admitting: Family Medicine

## 2020-08-12 VITALS — BP 134/78 | HR 66 | Temp 98.3°F | Resp 16 | Wt 237.6 lb

## 2020-08-12 DIAGNOSIS — R600 Localized edema: Secondary | ICD-10-CM | POA: Diagnosis not present

## 2020-08-12 DIAGNOSIS — E1169 Type 2 diabetes mellitus with other specified complication: Secondary | ICD-10-CM

## 2020-08-12 DIAGNOSIS — E785 Hyperlipidemia, unspecified: Secondary | ICD-10-CM

## 2020-08-12 DIAGNOSIS — R413 Other amnesia: Secondary | ICD-10-CM | POA: Diagnosis not present

## 2020-08-12 DIAGNOSIS — Z23 Encounter for immunization: Secondary | ICD-10-CM

## 2020-08-12 DIAGNOSIS — I152 Hypertension secondary to endocrine disorders: Secondary | ICD-10-CM | POA: Diagnosis not present

## 2020-08-12 DIAGNOSIS — R079 Chest pain, unspecified: Secondary | ICD-10-CM | POA: Diagnosis not present

## 2020-08-12 DIAGNOSIS — E1142 Type 2 diabetes mellitus with diabetic polyneuropathy: Secondary | ICD-10-CM | POA: Diagnosis not present

## 2020-08-12 DIAGNOSIS — E1159 Type 2 diabetes mellitus with other circulatory complications: Secondary | ICD-10-CM

## 2020-08-12 LAB — POCT GLYCOSYLATED HEMOGLOBIN (HGB A1C)
Est. average glucose Bld gHb Est-mCnc: 194
Hemoglobin A1C: 8.4 % — AB (ref 4.0–5.6)

## 2020-08-12 NOTE — Assessment & Plan Note (Signed)
Previously well controlled Continue statin Repeat FLP and CMP Goal LDL < 70 

## 2020-08-12 NOTE — Assessment & Plan Note (Signed)
No red flags Encourage compression and elevation Can use lasix prn

## 2020-08-12 NOTE — Progress Notes (Signed)
Established patient visit   Patient: Jean Gonzales   DOB: Jan 04, 1956   65 y.o. Female  MRN: [id number] Visit Date: 08/12/2020  Today's healthcare provider: Lavon Paganini, MD   Chief Complaint  Patient presents with  . Diabetes  . Hypertension   Subjective    HPI  Knee Pain She reports she is having a lot of pain in her knees. She doesn't like to take tylenol and ibproben because she is worried about it affecting her kidneys. She uses Voltarin and this provides temporary relief.   Chronic UTIs She has followed up with a urologist for her chronic UTIs and they plan to preform a cystostomy to rule out cancer in the bladder. She is taking macrobid and she is on her last pill today for her UTI. She has had UTIs intermittently since November 2 years ago. She was taking Humira for her arthritis but it would make her UTI symptoms worse and it didn't resolve her pain.   LE edema  She noticed LE edema yesterday and she has not taken her lasix for her symptoms this most previous occurrence. It was recommended for her to take lasix when symptoms arose. She denies doing exercise as much as she could because she finds it difficult to move around. She it was suggested for her to use compression sock but she is not interested because of her claustrophobia.   Hyperglycemia  She is currently not taking any medications for her blood sugar. She reports taking Metformin in the past however she stopped taking Metformin and began the keto diet. She remembers her blood sugar improved but since her husbands retirement she has not maintained her diet.   Memory loss For the past 8 months she reports an increase in memory loss and a feeling of going "blank." She is requesting for a consultation with a neurologist.  Chest Pain She reports she notices a sharp pain in her chest. In the past month she feels the pain in her chest after exertion and at rest.   Diabetes Mellitus Type II,  follow-up  Lab Results  Component Value Date   HGBA1C 7.2 (A) 04/21/2020   HGBA1C 7.1 (H) 07/03/2019   HGBA1C 6.9 (H) 04/19/2018   Last seen for diabetes 4 months ago.  Management since then includes continuing the same treatment. She reports excellent compliance with treatment. She is not having side effects.  Home blood sugar records: not being checked  Episodes of hypoglycemia? No   Current insulin regiment: none Most Recent Eye Exam: will schedule  --------------------------------------------------------------------------------------------------- Hypertension, follow-up  BP Readings from Last 3 Encounters:  08/12/20 134/78  07/29/20 (!) 168/101  07/14/20 (!) 168/117   Wt Readings from Last 3 Encounters:  08/12/20 237 lb 9.6 oz (107.8 kg)  07/29/20 237 lb (107.5 kg)  07/14/20 237 lb (107.5 kg)     She was last seen for hypertension 4 months ago.  BP at that visit was 168/101. Management since that visit includes no changes. She reports excellent compliance with treatment. She is not having side effects.  She is not exercising. She is not adherent to low salt diet.   Outside blood pressures are not being checked.  She does not smoke.  Use of agents associated with hypertension: none.   --------------------------------------------------------------------------------------------------- Lipid/Cholesterol, follow-up  Last Lipid Panel: Lab Results  Component Value Date   CHOL 169 07/03/2019   LDLCALC 90 07/03/2019   HDL 38 (L) 07/03/2019   TRIG 242 (H) 07/03/2019  She was last seen for this 4 months ago.  Management since that visit includes no changes.  She reports excellent compliance with treatment. She is not having side effects.  Symptoms: No appetite changes No foot ulcerations  No chest pain No chest pressure/discomfort  No dyspnea No orthopnea  No fatigue Yes lower extremity edema  No palpitations No paroxysmal nocturnal dyspnea  No nausea  Yes numbness or tingling of extremity  No polydipsia No polyuria  No speech difficulty No syncope   She is following a Regular diet. Current exercise: no regular exercise  Last metabolic panel Lab Results  Component Value Date   GLUCOSE 213 (H) 03/09/2020   NA 137 03/09/2020   K 3.8 03/09/2020   BUN 10 03/09/2020   CREATININE 1.07 (H) 03/09/2020   GFRNONAA 57 (L) 03/09/2020   GFRAA 68 07/03/2019   CALCIUM 9.3 03/09/2020   AST 25 03/09/2020   ALT 23 03/09/2020   The 10-year ASCVD risk score Mikey Bussing DC Jr., et al., 2013) is: 17.7%  ---------------------------------------------------------------------------------------------------   Patient Active Problem List   Diagnosis Date Noted  . Diabetic polyneuropathy associated with type 2 diabetes mellitus ([place]) 08/12/2020  . Ankylosing spondylitis ([place]) 06/28/2020  . Bilateral edema of lower extremity 06/28/2020  . Morbid obesity ([place]) 10/06/2019  . Chest pain 09/29/2017  . History of total knee arthroplasty 08/01/2017  . Osteoarthritis of right knee 07/09/2017  . Osteoarthritis 05/07/2017  . Synovitis of knee 12/29/2016  . Knee joint effusion 12/29/2016  . Malignant neoplasm of endometrium ([place]) 02/22/2015  . Acid reflux 02/22/2015  . History of migraine headaches 02/22/2015  . Hyperlipidemia associated with type 2 diabetes mellitus ([place]) 02/22/2015  . Hypertension associated with diabetes ([place]) 02/22/2015  . History of colon polyps 02/22/2015  . Urinary incontinence 02/22/2015  . Psoriatic arthritis ([place]) 02/22/2015  . Diabetes mellitus ([place]) 10/06/2014  . Urethral meatal stenosis 12/03/2013  . Rheumatoid arthritis ([place]) 03/14/2013   Social History   Tobacco Use  . Smoking status: Never Smoker  . Smokeless tobacco: Never Used  Vaping Use  . Vaping Use: Never used  Substance Use Topics  . Alcohol use: No    Alcohol/week: 0.0 standard drinks  . Drug use: No   Allergies  Allergen Reactions  . Iodinated Diagnostic  Agents Anaphylaxis  . Bactrim [Sulfamethoxazole-Trimethoprim] Itching and Rash       Medications: Outpatient Medications Prior to Visit  Medication Sig  . acetaminophen (TYLENOL) 325 MG tablet Take 1-2 tablets (325-650 mg total) by mouth every 6 (six) hours as needed for mild pain (pain score 1-3 or temp > 100.5).  Marland Kitchen atorvastatin (LIPITOR) 40 MG tablet TAKE 1 TABLET BY MOUTH EVERY DAY IN THE MORNING  . celecoxib (CELEBREX) 100 MG capsule TAKE 1 CAPSULE BY MOUTH TWICE A DAY  . diclofenac sodium (VOLTAREN) 1 % GEL Apply 2 g topically 4 (four) times daily. Apply to knee  . esomeprazole (NEXIUM) 40 MG capsule TAKE 1 CAPSULE BY MOUTH EVERY DAY  . furosemide (LASIX) 20 MG tablet TAKE 1 TABLET BY MOUTH TWICE A DAY  . nitrofurantoin, macrocrystal-monohydrate, (MACROBID) 100 MG capsule Take 1 capsule (100 mg total) by mouth every 12 (twelve) hours for 7 days.  Marland Kitchen nystatin (MYCOSTATIN/NYSTOP) powder Apply topically 3 (three) times daily.  . ondansetron (ZOFRAN ODT) 4 MG disintegrating tablet Take 1 tablet (4 mg total) by mouth every 8 (eight) hours as needed for nausea or vomiting.  . tamsulosin (FLOMAX) 0.4 MG CAPS capsule TAKE 1 CAPSULE  BY MOUTH EVERY DAY  . triamcinolone ointment (KENALOG) 0.1 % APPLY TO AFFECTED AREA TWICE A DAY  . Turmeric, Curcuma Longa, (CURCUMIN) POWD by Does not apply route. With ginger and black seed  . zolpidem (AMBIEN) 10 MG tablet TAKE 1 TABLET BY MOUTH AT BEDTIME AS NEEDED FOR SLEEP.   No facility-administered medications prior to visit.    Review of Systems  Constitutional: Negative for activity change, appetite change, fatigue and fever.  HENT: Negative for ear pain, nosebleeds, sinus pressure, sinus pain and sore throat.   Eyes: Negative for pain.  Respiratory: Negative for chest tightness and shortness of breath.   Cardiovascular: Positive for leg swelling. Negative for chest pain and palpitations.  Gastrointestinal: Negative for abdominal pain, blood in stool,  diarrhea, nausea and vomiting.  Genitourinary: Negative for dysuria, flank pain, frequency, hematuria, pelvic pain and urgency.  Musculoskeletal: Positive for arthralgias and myalgias. Negative for back pain, neck pain and neck stiffness.  Neurological: Negative for dizziness, seizures, syncope, weakness, light-headedness, numbness and headaches.        Objective    BP 134/78 (BP Location: Left Arm, Patient Position: Sitting, Cuff Size: Large)   Pulse 66   Temp 98.3 F (36.8 C) (Oral)   Resp 16   Wt 237 lb 9.6 oz (107.8 kg)   SpO2 97%   BMI 37.21 kg/m  BP Readings from Last 3 Encounters:  08/12/20 134/78  07/29/20 (!) 168/101  07/14/20 (!) 168/117   Wt Readings from Last 3 Encounters:  08/12/20 237 lb 9.6 oz (107.8 kg)  07/29/20 237 lb (107.5 kg)  07/14/20 237 lb (107.5 kg)      Physical Exam Vitals reviewed.  Constitutional:      General: She is not in acute distress.    Appearance: Normal appearance. She is well-developed. She is not diaphoretic.  HENT:     Head: Normocephalic and atraumatic.  Eyes:     General: No scleral icterus.    Conjunctiva/sclera: Conjunctivae normal.  Neck:     Thyroid: No thyromegaly.  Cardiovascular:     Rate and Rhythm: Normal rate and regular rhythm.     Pulses: Normal pulses.     Heart sounds: Normal heart sounds. No murmur heard.   Pulmonary:     Effort: Pulmonary effort is normal. No respiratory distress.     Breath sounds: Normal breath sounds. No wheezing, rhonchi or rales.  Musculoskeletal:     Cervical back: Neck supple.     Right lower leg: Edema present.     Left lower leg: Edema present.  Lymphadenopathy:     Cervical: No cervical adenopathy.  Skin:    General: Skin is warm and dry.     Findings: No rash.  Neurological:     Mental Status: She is alert and oriented to person, place, and time. Mental status is at baseline.  Psychiatric:        Mood and Affect: Mood normal.        Behavior: Behavior normal.        No results found for any visits on 08/12/20.  Assessment & Plan     Problem List Items Addressed This Visit      Cardiovascular and Mediastinum   Hypertension associated with diabetes ([place])    Well controlled Continue diet control Recheck metabolic panel F/u in 6 months       Relevant Orders   Comprehensive metabolic panel     Endocrine   Diabetes mellitus ([place]) - Primary  Elevated a1c with hyperglycemia, uncontrolled Declines medications - will work on diet changes UTD on foot exam Discussed need for vaccines (PCV 20 today), eye exam On Statin Discussed diet and exercise F/u in 3 months       Relevant Orders   POCT glycosylated hemoglobin (Hb A1C)   Hyperlipidemia associated with type 2 diabetes mellitus ([facility])    Previously well controlled Continue statin Repeat FLP and CMP Goal LDL < 70      Relevant Orders   Comprehensive metabolic panel   Lipid panel   Diabetic polyneuropathy associated with type 2 diabetes mellitus ([facility])    Chronic and stable Not on medications        Other   Chest pain    Longstanding brief sharp chest pains, but now with post-exertional hest pain Warrants cardiology eval No chest pain today - stable      Relevant Orders   Ambulatory referral to Cardiology   Morbid obesity ([place])    BMI >37 and associated with DM, HTN, HLD Discussed importance of healthy weight management Discussed diet and exercise       Bilateral edema of lower extremity    No red flags Encourage compression and elevation Can use lasix prn       Other Visit Diagnoses    Memory changes       Relevant Orders   Ambulatory referral to Neurology   Need for pneumococcal vaccination       Relevant Orders   Pneumococcal conjugate vaccine 20-valent (Completed)       Return in about 3 months (around 11/12/2020) for chronic disease f/u.       I,Essence Turner,acting as a Education administrator for Lavon Paganini, MD.,have documented all relevant documentation  on the behalf of Lavon Paganini, MD,as directed by  Lavon Paganini, MD while in the presence of Lavon Paganini, MD.  I, Lavon Paganini, MD, have reviewed all documentation for this visit. The documentation on 08/12/20 for the exam, diagnosis, procedures, and orders are all accurate and complete.   Bailley [place], Dionne Bucy, MD, MPH [facility]

## 2020-08-12 NOTE — Patient Instructions (Signed)
Diabetes Mellitus and Nutrition, Adult When you have diabetes, or diabetes mellitus, it is very important to have healthy eating habits because your blood sugar (glucose) levels are greatly affected by what you eat and drink. Eating healthy foods in the right amounts, at about the same times every day, can help you:  Control your blood glucose.  Lower your risk of heart disease.  Improve your blood pressure.  Reach or maintain a healthy weight. What can affect my meal plan? Every person with diabetes is different, and each person has different needs for a meal plan. Your health care provider may recommend that you work with a dietitian to make a meal plan that is best for you. Your meal plan may vary depending on factors such as:  The calories you need.  The medicines you take.  Your weight.  Your blood glucose, blood pressure, and cholesterol levels.  Your activity level.  Other health conditions you have, such as heart or kidney disease. How do carbohydrates affect me? Carbohydrates, also called carbs, affect your blood glucose level more than any other type of food. Eating carbs naturally raises the amount of glucose in your blood. Carb counting is a method for keeping track of how many carbs you eat. Counting carbs is important to keep your blood glucose at a healthy level, especially if you use insulin or take certain oral diabetes medicines. It is important to know how many carbs you can safely have in each meal. This is different for every person. Your dietitian can help you calculate how many carbs you should have at each meal and for each snack. How does alcohol affect me? Alcohol can cause a sudden decrease in blood glucose (hypoglycemia), especially if you use insulin or take certain oral diabetes medicines. Hypoglycemia can be a life-threatening condition. Symptoms of hypoglycemia, such as sleepiness, dizziness, and confusion, are similar to symptoms of having too much  alcohol.  Do not drink alcohol if: ? Your health care provider tells you not to drink. ? You are pregnant, may be pregnant, or are planning to become pregnant.  If you drink alcohol: ? Do not drink on an empty stomach. ? Limit how much you use to:  0-1 drink a day for women.  0-2 drinks a day for men. ? Be aware of how much alcohol is in your drink. In the [place] one drink equals one 12 oz bottle of beer (355 mL), one 5 oz glass of wine (148 mL), or one 1 oz glass of hard liquor (44 mL). ? Keep yourself hydrated with water, diet soda, or unsweetened iced tea.  Keep in mind that regular soda, juice, and other mixers may contain a lot of sugar and must be counted as carbs. What are tips for following this plan? Reading food labels  Start by checking the serving size on the "Nutrition Facts" label of packaged foods and drinks. The amount of calories, carbs, fats, and other nutrients listed on the label is based on one serving of the item. Many items contain more than one serving per package.  Check the total grams (g) of carbs in one serving. You can calculate the number of servings of carbs in one serving by dividing the total carbs by 15. For example, if a food has 30 g of total carbs per serving, it would be equal to 2 servings of carbs.  Check the number of grams (g) of saturated fats and trans fats in one serving. Choose foods that have   a low amount or none of these fats.  Check the number of milligrams (mg) of salt (sodium) in one serving. Most people should limit total sodium intake to less than 2,300 mg per day.  Always check the nutrition information of foods labeled as "low-fat" or "nonfat." These foods may be higher in added sugar or refined carbs and should be avoided.  Talk to your dietitian to identify your daily goals for nutrients listed on the label. Shopping  Avoid buying canned, pre-made, or processed foods. These foods tend to be high in fat, sodium, and added  sugar.  Shop around the outside edge of the grocery store. This is where you will most often find fresh fruits and vegetables, bulk grains, fresh meats, and fresh dairy. Cooking  Use low-heat cooking methods, such as baking, instead of high-heat cooking methods like deep frying.  Cook using healthy oils, such as olive, canola, or sunflower oil.  Avoid cooking with butter, cream, or high-fat meats. Meal planning  Eat meals and snacks regularly, preferably at the same times every day. Avoid going long periods of time without eating.  Eat foods that are high in fiber, such as fresh fruits, vegetables, beans, and whole grains. Talk with your dietitian about how many servings of carbs you can eat at each meal.  Eat 4-6 oz (112-168 g) of lean protein each day, such as lean meat, chicken, fish, eggs, or tofu. One ounce (oz) of lean protein is equal to: ? 1 oz (28 g) of meat, chicken, or fish. ? 1 egg. ?  cup (62 g) of tofu.  Eat some foods each day that contain healthy fats, such as avocado, nuts, seeds, and fish.   What foods should I eat? Fruits Berries. Apples. Oranges. Peaches. Apricots. Plums. Grapes. Mango. Papaya. Pomegranate. Kiwi. Cherries. Vegetables Lettuce. Spinach. Leafy greens, including kale, chard, collard greens, and mustard greens. Beets. Cauliflower. Cabbage. Broccoli. Carrots. Green beans. Tomatoes. Peppers. Onions. Cucumbers. Brussels sprouts. Grains Whole grains, such as whole-wheat or whole-grain bread, crackers, tortillas, cereal, and pasta. Unsweetened oatmeal. Quinoa. Brown or wild rice. Meats and other proteins Seafood. Poultry without skin. Lean cuts of poultry and beef. Tofu. Nuts. Seeds. Dairy Low-fat or fat-free dairy products such as milk, yogurt, and cheese. The items listed above may not be a complete list of foods and beverages you can eat. Contact a dietitian for more information. What foods should I avoid? Fruits Fruits canned with  syrup. Vegetables Canned vegetables. Frozen vegetables with butter or cream sauce. Grains Refined white flour and flour products such as bread, pasta, snack foods, and cereals. Avoid all processed foods. Meats and other proteins Fatty cuts of meat. Poultry with skin. Breaded or fried meats. Processed meat. Avoid saturated fats. Dairy Full-fat yogurt, cheese, or milk. Beverages Sweetened drinks, such as soda or iced tea. The items listed above may not be a complete list of foods and beverages you should avoid. Contact a dietitian for more information. Questions to ask a health care provider  Do I need to meet with a diabetes educator?  Do I need to meet with a dietitian?  What number can I call if I have questions?  When are the best times to check my blood glucose? Where to find more information:  American Diabetes Association: diabetes.org  Academy of Nutrition and Dietetics: www.eatright.org  National Institute of Diabetes and Digestive and Kidney Diseases: www.niddk.nih.gov  Association of Diabetes Care and Education Specialists: www.diabeteseducator.org Summary  It is important to have healthy eating   habits because your blood sugar (glucose) levels are greatly affected by what you eat and drink.  A healthy meal plan will help you control your blood glucose and maintain a healthy lifestyle.  Your health care provider may recommend that you work with a dietitian to make a meal plan that is best for you.  Keep in mind that carbohydrates (carbs) and alcohol have immediate effects on your blood glucose levels. It is important to count carbs and to use alcohol carefully. This information is not intended to replace advice given to you by your health care provider. Make sure you discuss any questions you have with your health care provider. Document Revised: 02/18/2019 Document Reviewed: 02/18/2019 Elsevier Patient Education  2021 Elsevier Inc.  

## 2020-08-12 NOTE — Assessment & Plan Note (Signed)
Longstanding brief sharp chest pains, but now with post-exertional hest pain Warrants cardiology eval No chest pain today - stable

## 2020-08-12 NOTE — Assessment & Plan Note (Addendum)
Elevated a1c with hyperglycemia, uncontrolled Declines medications - will work on diet changes UTD on foot exam Discussed need for vaccines (PCV 20 today), eye exam On Statin Discussed diet and exercise F/u in 3 months

## 2020-08-12 NOTE — Assessment & Plan Note (Signed)
Chronic and stable Not on medications

## 2020-08-12 NOTE — Assessment & Plan Note (Signed)
Well controlled Continue diet control Recheck metabolic panel F/u in 6 months

## 2020-08-12 NOTE — Assessment & Plan Note (Signed)
BMI >37 and associated with DM, HTN, HLD Discussed importance of healthy weight management Discussed diet and exercise

## 2020-08-13 LAB — LIPID PANEL
Chol/HDL Ratio: 4.7 ratio — ABNORMAL HIGH (ref 0.0–4.4)
Cholesterol, Total: 155 mg/dL (ref 100–199)
HDL: 33 mg/dL — ABNORMAL LOW (ref >39)
LDL Chol Calc (NIH): 82 mg/dL (ref 0–99)
Triglycerides: 238 mg/dL — ABNORMAL HIGH (ref 0–149)
VLDL Cholesterol Cal: 40 mg/dL (ref 5–40)

## 2020-08-13 LAB — COMPREHENSIVE METABOLIC PANEL
ALT: 21 IU/L (ref 0–32)
AST: 21 IU/L (ref 0–40)
Albumin/Globulin Ratio: 1.8 (ref 1.2–2.2)
Albumin: 4.2 g/dL (ref 3.8–4.8)
Alkaline Phosphatase: 109 IU/L (ref 44–121)
BUN/Creatinine Ratio: 10 — ABNORMAL LOW (ref 12–28)
BUN: 9 mg/dL (ref 8–27)
Bilirubin Total: 0.5 mg/dL (ref 0.0–1.2)
CO2: 23 mmol/L (ref 20–29)
Calcium: 9 mg/dL (ref 8.7–10.3)
Chloride: 100 mmol/L (ref 96–106)
Creatinine, Ser: 0.9 mg/dL (ref 0.57–1.00)
Globulin, Total: 2.4 g/dL (ref 1.5–4.5)
Glucose: 162 mg/dL — ABNORMAL HIGH (ref 65–99)
Potassium: 3.8 mmol/L (ref 3.5–5.2)
Sodium: 138 mmol/L (ref 134–144)
Total Protein: 6.6 g/dL (ref 6.0–8.5)
eGFR: 71 mL/min/{1.73_m2} (ref >59)

## 2020-08-19 ENCOUNTER — Encounter: Payer: Self-pay | Admitting: Urology

## 2020-08-19 ENCOUNTER — Other Ambulatory Visit: Payer: Self-pay

## 2020-08-19 ENCOUNTER — Ambulatory Visit: Payer: Medicare HMO | Admitting: Urology

## 2020-08-19 VITALS — BP 140/84 | HR 86 | Ht 67.0 in | Wt 235.0 lb

## 2020-08-19 DIAGNOSIS — R339 Retention of urine, unspecified: Secondary | ICD-10-CM | POA: Diagnosis not present

## 2020-08-19 NOTE — Progress Notes (Signed)
In and Out Catheterization  Patient is present today for a I & O catheterization due to UTI. Patient was cleaned and prepped in a sterile fashion with betadine . A 14FR cath was inserted no complications were noted , 54ml of urine return was noted, urine was yellow in color. A clean urine sample was collected for UA,UCX. Bladder was drained  And catheter was removed with out difficulty.    Preformed by: Elberta Leatherwood, CMA  Follow up/ Additional notes: 09/06/2020 for Cysto

## 2020-08-20 LAB — URINALYSIS, COMPLETE
Bilirubin, UA: NEGATIVE
Glucose, UA: NEGATIVE
Ketones, UA: NEGATIVE
Nitrite, UA: NEGATIVE
Specific Gravity, UA: 1.025 (ref 1.005–1.030)
Urobilinogen, Ur: 0.2 mg/dL (ref 0.2–1.0)
pH, UA: 5.5 (ref 5.0–7.5)

## 2020-08-20 LAB — MICROSCOPIC EXAMINATION: WBC, UA: >30 /hpf — AB (ref 0–5)

## 2020-08-20 NOTE — Progress Notes (Signed)
Scheduled for cystoscopy with UTI symptoms and urinalysis with significant pyuria.  Urine culture was ordered and the cystoscopy was postponed

## 2020-08-23 LAB — CULTURE, URINE COMPREHENSIVE

## 2020-08-24 ENCOUNTER — Encounter: Payer: Self-pay | Admitting: *Deleted

## 2020-08-24 ENCOUNTER — Telehealth: Payer: Self-pay | Admitting: Urology

## 2020-08-24 MED ORDER — NITROFURANTOIN MONOHYD MACRO 100 MG PO CAPS: ORAL_CAPSULE | ORAL | 0 refills | Status: DC

## 2020-08-24 NOTE — Telephone Encounter (Signed)
Urine culture growing 2 different bacteria.  Rx Macrobid sent to pharmacy to take daily x7 days then once daily until her cystoscopy appointment 09/06/2020

## 2020-08-24 NOTE — Telephone Encounter (Signed)
Notified patient as instructed, patient pleased.Also sent my chart message also

## 2020-09-02 ENCOUNTER — Telehealth: Payer: Self-pay | Admitting: Urology

## 2020-09-02 ENCOUNTER — Other Ambulatory Visit: Payer: Self-pay

## 2020-09-02 ENCOUNTER — Other Ambulatory Visit: Payer: Medicare HMO

## 2020-09-02 DIAGNOSIS — N390 Urinary tract infection, site not specified: Secondary | ICD-10-CM

## 2020-09-02 LAB — URINALYSIS, COMPLETE
Bilirubin, UA: NEGATIVE
Glucose, UA: NEGATIVE
Ketones, UA: NEGATIVE
Nitrite, UA: NEGATIVE
Protein,UA: NEGATIVE
RBC, UA: NEGATIVE
Specific Gravity, UA: <1.005 — ABNORMAL LOW (ref 1.005–1.030)
Urobilinogen, Ur: 0.2 mg/dL (ref 0.2–1.0)
pH, UA: 6 (ref 5.0–7.5)

## 2020-09-02 LAB — MICROSCOPIC EXAMINATION

## 2020-09-02 NOTE — Telephone Encounter (Signed)
Patient is scheduled for a cysto on 09/06/20 but called today because her urine is cloudy and has an odor. [facility] asked for her to com ein to leave a UA and culture on the lab. Patient was not wanting to do that and said she would call back. I informed her that we would have to cancel her cysto if she had an infection. She said ok   Jean Gonzales

## 2020-09-05 LAB — CULTURE, URINE COMPREHENSIVE

## 2020-09-06 ENCOUNTER — Other Ambulatory Visit: Payer: Self-pay | Admitting: Urology

## 2020-09-06 ENCOUNTER — Telehealth: Payer: Self-pay | Admitting: *Deleted

## 2020-09-06 MED ORDER — CEFDINIR 300 MG PO CAPS: 300.0000 mg | ORAL_CAPSULE | Freq: Two times a day (BID) | ORAL | 0 refills | Status: AC

## 2020-09-06 NOTE — Telephone Encounter (Signed)
Notified patient as instructed, patient pleased. Discussed follow-up appointments, patient agrees  

## 2020-09-06 NOTE — Telephone Encounter (Signed)
-----   Message from [name], MD sent at 09/06/2020  7:22 AM EDT ----- Urine culture was positive.  Bacteria was resistant to nitrofurantoin.  Discontinue this medication.  Rx cefdinir was sent to pharmacy.  Reschedule cystoscopy to next week

## 2020-09-15 ENCOUNTER — Encounter: Payer: Self-pay | Admitting: Urology

## 2020-09-15 ENCOUNTER — Ambulatory Visit (INDEPENDENT_AMBULATORY_CARE_PROVIDER_SITE_OTHER): Payer: Medicare HMO | Admitting: Urology

## 2020-09-15 ENCOUNTER — Other Ambulatory Visit: Payer: Self-pay

## 2020-09-15 VITALS — BP 171/97 | HR 87 | Ht 65.0 in | Wt 232.0 lb

## 2020-09-15 DIAGNOSIS — R310 Gross hematuria: Secondary | ICD-10-CM

## 2020-09-15 DIAGNOSIS — N390 Urinary tract infection, site not specified: Secondary | ICD-10-CM | POA: Diagnosis not present

## 2020-09-15 DIAGNOSIS — R339 Retention of urine, unspecified: Secondary | ICD-10-CM | POA: Diagnosis not present

## 2020-09-15 NOTE — Progress Notes (Signed)
   09/15/20  CC:  Chief Complaint  Patient presents with   Cysto    HPI: Refer to Shannon's note 07/29/2020.  Cystoscopy rescheduled twice due to symptomatic UTI  Blood pressure (!) 171/97, pulse 87, height 5\' 5"  (1.651 m), weight 232 lb (105.2 kg). NED. A&Ox3.   No respiratory distress   Abd soft, NT, ND Atrophic external genitalia with patent urethral meatus  Cystoscopy Procedure Note  Patient identification was confirmed, informed consent was obtained, and patient was prepped using Betadine solution.  Lidocaine jelly was administered per urethral meatus.    Procedure: - Flexible cystoscope introduced, without any difficulty.   - Thorough search of the bladder revealed:    normal urethral meatus; urethra slightly tight on scope passage    normal urothelium    no stones    no ulcers     no tumors    no urethral polyps    no trabeculation  - Ureteral orifices were normal in position and appearance.  Post-Procedure: - Patient tolerated the procedure well  Assessment/ Plan: No bladder mucosal abnormalities She does feel she adequately empties her bladder and is catheterizing primarily secondary to urethral stenosis Recommend decreasing catheterizations to once daily and prn for emptying Follow-up 3 months for symptom check bladder scan To call earlier for recurrent UTI symptoms   [name], MD

## 2020-09-16 LAB — URINALYSIS, COMPLETE
Bilirubin, UA: NEGATIVE
Glucose, UA: NEGATIVE
Ketones, UA: NEGATIVE
Leukocytes,UA: NEGATIVE
Nitrite, UA: NEGATIVE
Protein,UA: NEGATIVE
Specific Gravity, UA: 1.015 (ref 1.005–1.030)
Urobilinogen, Ur: 0.2 mg/dL (ref 0.2–1.0)
pH, UA: 5.5 (ref 5.0–7.5)

## 2020-09-16 LAB — MICROSCOPIC EXAMINATION: Bacteria, UA: NONE SEEN

## 2020-09-20 ENCOUNTER — Other Ambulatory Visit: Payer: Self-pay

## 2020-09-20 ENCOUNTER — Encounter: Payer: Self-pay | Admitting: Urology

## 2020-09-20 DIAGNOSIS — N390 Urinary tract infection, site not specified: Secondary | ICD-10-CM

## 2020-09-21 ENCOUNTER — Other Ambulatory Visit: Payer: Medicare HMO

## 2020-09-21 ENCOUNTER — Other Ambulatory Visit: Payer: Self-pay

## 2020-09-21 DIAGNOSIS — N390 Urinary tract infection, site not specified: Secondary | ICD-10-CM | POA: Diagnosis not present

## 2020-09-22 ENCOUNTER — Telehealth: Payer: Self-pay

## 2020-09-22 NOTE — Telephone Encounter (Signed)
  Care Management   Follow Up Note   09/22/2020 Name: Jean Gonzales MRN: [id number] DOB: 05-01-1955   Primary Care Provider: Virginia Crews, MD Reason for referral : Chronic Care Management   An unsuccessful outreach was attempted today. Jean Gonzales is currently enrolled in the Chronic Care Management program.    Follow Up Plan:  A HIPAA compliant voice message was left today requesting a return call.    [name] [phone]

## 2020-09-23 ENCOUNTER — Other Ambulatory Visit: Payer: Self-pay | Admitting: *Deleted

## 2020-09-23 ENCOUNTER — Telehealth: Payer: Self-pay | Admitting: *Deleted

## 2020-09-23 MED ORDER — CEFDINIR 300 MG PO CAPS: 300.0000 mg | ORAL_CAPSULE | Freq: Two times a day (BID) | ORAL | 0 refills | Status: DC

## 2020-09-23 NOTE — Telephone Encounter (Signed)
Notified patient as instructed, patient pleased. Discussed follow-up appointments, patient agrees  

## 2020-09-23 NOTE — Telephone Encounter (Signed)
-----   Message from [name], MD sent at 09/23/2020  1:22 PM EDT ----- Urine culture is pending.  Please send in Rx cefdinir 300 mg 1 twice daily for 7 days pending the culture results.  Once culture returns will try to find a low-dose antibiotic that she can take for UTI prevention.

## 2020-09-24 LAB — CULTURE, URINE COMPREHENSIVE

## 2020-09-27 ENCOUNTER — Encounter: Payer: Self-pay | Admitting: Urology

## 2020-09-27 ENCOUNTER — Other Ambulatory Visit: Payer: Self-pay | Admitting: Urology

## 2020-09-27 MED ORDER — AMOXICILLIN 875 MG PO TABS: 875.0000 mg | ORAL_TABLET | Freq: Two times a day (BID) | ORAL | 0 refills | Status: DC

## 2020-09-27 MED ORDER — NITROFURANTOIN MACROCRYSTAL 50 MG PO CAPS: ORAL_CAPSULE | ORAL | 3 refills | Status: DC

## 2020-09-27 NOTE — Progress Notes (Signed)
Antibiotic change

## 2020-10-07 ENCOUNTER — Other Ambulatory Visit: Payer: Self-pay | Admitting: Family Medicine

## 2020-10-07 DIAGNOSIS — Z1231 Encounter for screening mammogram for malignant neoplasm of breast: Secondary | ICD-10-CM

## 2020-10-08 ENCOUNTER — Other Ambulatory Visit: Payer: Self-pay

## 2020-10-08 ENCOUNTER — Ambulatory Visit (INDEPENDENT_AMBULATORY_CARE_PROVIDER_SITE_OTHER): Payer: Medicare HMO | Admitting: Cardiology

## 2020-10-08 ENCOUNTER — Encounter: Payer: Self-pay | Admitting: Cardiology

## 2020-10-08 VITALS — BP 126/82 | HR 82 | Ht 67.5 in | Wt 229.0 lb

## 2020-10-08 DIAGNOSIS — R072 Precordial pain: Secondary | ICD-10-CM

## 2020-10-08 DIAGNOSIS — E782 Mixed hyperlipidemia: Secondary | ICD-10-CM | POA: Diagnosis not present

## 2020-10-08 MED ORDER — FENOFIBRATE 145 MG PO TABS: 145.0000 mg | ORAL_TABLET | Freq: Every day | ORAL | 5 refills | Status: DC

## 2020-10-08 MED ORDER — NITROGLYCERIN 0.4 MG SL SUBL: 0.4000 mg | SUBLINGUAL_TABLET | SUBLINGUAL | 3 refills | Status: DC | PRN

## 2020-10-08 NOTE — Patient Instructions (Signed)
Medication Instructions:   Your physician has recommended you make the following change in your medication:    START taking Fenofibrate 145 MG once a day.  2.    START taking Nitroglycerin 0.4 MG under the tongue every 5 minutes as needed for chest pain, for a maximum of 3 doses in one day.    *If you need a refill on your cardiac medications before your next appointment, please call your pharmacy*   Lab Work: None ordered If you have labs (blood work) drawn today and your tests are completely normal, you will receive your results only by: [place] (if you have MyChart) OR A paper copy in the mail If you have any lab test that is abnormal or we need to change your treatment, we will call you to review the results.   Testing/Procedures:    Your physician has requested that you have an echocardiogram. Echocardiography is a painless test that uses sound waves to create images of your heart. It provides your doctor with information about the size and shape of your heart and how well your heart's chambers and valves are working. This procedure takes approximately one hour. There are no restrictions for this procedure.  2.     [place]       Your caregiver has ordered a Stress Test with nuclear imaging. The purpose of this test is to evaluate the blood supply to your heart muscle. This procedure is referred to as a "Non-Invasive Stress Test." This is because other than having an IV started in your vein, nothing is inserted or "invades" your body. Cardiac stress tests are done to find areas of poor blood flow to the heart by determining the extent of coronary artery disease (CAD). Some patients exercise on a treadmill, which naturally increases the blood flow to your heart, while others who are  unable to walk on a treadmill due to physical limitations have a pharmacologic/chemical stress agent called Lexiscan . This medicine will mimic walking on a treadmill by temporarily  increasing your coronary blood flow.      PLEASE REPORT TO [facility] MEDICAL MALL ENTRANCE   THE VOLUNTEERS AT THE FIRST DESK WILL DIRECT YOU WHERE TO GO     *Please note: these test may take anywhere between 2-4 hours to complete       Date of Procedure:_____________________________________   Arrival Time for Procedure:______________________________    PLEASE NOTIFY THE OFFICE AT LEAST 24 HOURS IN ADVANCE IF YOU ARE UNABLE TO KEEP YOUR APPOINTMENT.  [place] 24 HOURS IN ADVANCE IF YOU ARE UNABLE TO KEEP YOUR APPOINTMENT. [phone]      How to prepare for your Myoview test:    _XX___:  Hold other medications as follows: furosemide (LASIX)     1. Do not eat or drink after midnight  2. No caffeine for 24 hours prior to test  3. No smoking 24 hours prior to test.  4. Unless instructed otherwise, Take your medication with a small sips of water.    5.         Ladies, please do not wear dresses. Skirts or pants are appropriate. Please wear a short sleeve shirt.  6. No perfume, cologne or lotion.  7. Wear comfortable walking shoes. No heels!          Follow-Up: At [facility], you and your health needs are our priority.  As part of our continuing mission to provide you  with exceptional heart care, we have created designated Provider Care Teams.  These Care Teams include your primary Cardiologist (physician) and Advanced Practice Providers (APPs -  Physician Assistants and Nurse Practitioners) who all work together to provide you with the care you need, when you need it.  We recommend signing up for the patient portal called "MyChart".  Sign up information is provided on this After Visit Summary.  MyChart is used to connect with patients for Virtual Visits (Telemedicine).  Patients are able to view lab/test results, encounter notes, upcoming appointments, etc.  Non-urgent messages can be sent to your provider as well.   To learn  more about what you can do with MyChart, go to [email or web address].    Your next appointment:   Follow up testing   The format for your next appointment:   In Person  Provider:   Kate Sable, MD   Other Instructions

## 2020-10-08 NOTE — Progress Notes (Addendum)
Cardiology Office Note:    Date:  10/08/2020   ID:  Jean Gonzales, DOB [birth year], MRN [id number]  PCP:  Virginia Crews, MD   [facility] Providers Cardiologist:  Kate Sable, MD     Referring MD: Virginia Crews, MD   Chief Complaint  Patient presents with   New Patient (Initial Visit)    Referred by PCP for Active chest pain -- stabbing pain, SOB, swelling in ankles. Med reviewed verbally with patient.     History of Present Illness:    Jean Gonzales is a 65 y.o. female with a hx of hyperlipidemia, GERD who presents due to chest pain.  Patient states having worsening chest pain over the past year.  Symptoms are not always associated with exertion.  Sometimes gets out of breath with exertion and towards the end may get chest tightness.  Family history of CAD with her brother having heart attack in his 96s.  Takes statin for hyperlipidemia, triglycerides have been elevated for years now.  Past Medical History:  Diagnosis Date   Ankylosing spondylitis ([place])    Arthritis    osteoarthritis right knee   Cancer ([place]) 2014   endometrial CA   Diabetes mellitus without complication (HCC)    diet controlled   Endometrial cancer ([place])    GERD (gastroesophageal reflux disease)    Hyperlipidemia    Hypertension    improved after ca tx    Past Surgical History:  Procedure Laterality Date   ABDOMINAL HYSTERECTOMY  2015   cancer   BREAST BIOPSY Right 2008ish   core   CATARACT EXTRACTION W/PHACO Right 02/05/2018   Procedure: CATARACT EXTRACTION PHACO AND INTRAOCULAR LENS PLACEMENT ([place]) RIGHT DIABETES IVA TOPICAL;  Surgeon: Eulogio Bear, MD;  Location: [place];  Service: Ophthalmology;  Laterality: Right;   CATARACT EXTRACTION W/PHACO Left 02/25/2018   Procedure: CATARACT EXTRACTION PHACO AND INTRAOCULAR LENS PLACEMENT ([place]) LEFT DIABETIC;  Surgeon: Eulogio Bear, MD;  Location: [place];  Service: Ophthalmology;   Laterality: Left;   CHOLECYSTECTOMY  1992   COLONOSCOPY WITH PROPOFOL N/A 08/08/2019   Procedure: COLONOSCOPY WITH PROPOFOL;  Surgeon: Jonathon Bellows, MD;  Location: [facility] ENDOSCOPY;  Service: Gastroenterology;  Laterality: N/A;   EYE SURGERY     TONSILLECTOMY  1973   TONSILLECTOMY     TOTAL KNEE ARTHROPLASTY Right 07/09/2017   Procedure: TOTAL KNEE ARTHROPLASTY;  Surgeon: Lovell Sheehan, MD;  Location: [facility] ORS;  Service: Orthopedics;  Laterality: Right;    Current Medications: Current Meds  Medication Sig   acetaminophen (TYLENOL) 325 MG tablet Take 1-2 tablets (325-650 mg total) by mouth every 6 (six) hours as needed for mild pain (pain score 1-3 or temp > 100.5).   atorvastatin (LIPITOR) 40 MG tablet TAKE 1 TABLET BY MOUTH EVERY DAY IN THE MORNING   celecoxib (CELEBREX) 100 MG capsule TAKE 1 CAPSULE BY MOUTH TWICE A DAY   diclofenac sodium (VOLTAREN) 1 % GEL Apply 2 g topically 4 (four) times daily. Apply to knee   esomeprazole (NEXIUM) 40 MG capsule TAKE 1 CAPSULE BY MOUTH EVERY DAY   fenofibrate (TRICOR) 145 MG tablet Take 1 tablet (145 mg total) by mouth daily.   furosemide (LASIX) 20 MG tablet TAKE 1 TABLET BY MOUTH TWICE A DAY   nitrofurantoin (MACRODANTIN) 50 MG capsule 1 capsule daily.  Do not start until course of amoxicillin has been completed   nitroGLYCERIN (NITROSTAT) 0.4 MG SL tablet Place 1 tablet (0.4 mg total)  under the tongue every 5 (five) minutes as needed for chest pain. For a maximum of 3 doses in one day.   nystatin (MYCOSTATIN/NYSTOP) powder Apply topically 3 (three) times daily.   ondansetron (ZOFRAN ODT) 4 MG disintegrating tablet Take 1 tablet (4 mg total) by mouth every 8 (eight) hours as needed for nausea or vomiting.   tamsulosin (FLOMAX) 0.4 MG CAPS capsule TAKE 1 CAPSULE BY MOUTH EVERY DAY   triamcinolone ointment (KENALOG) 0.1 % APPLY TO AFFECTED AREA TWICE A DAY   Turmeric, Curcuma Longa, (CURCUMIN) POWD by Does not apply route. With ginger and black seed    zolpidem (AMBIEN) 10 MG tablet TAKE 1 TABLET BY MOUTH AT BEDTIME AS NEEDED FOR SLEEP.     Allergies:   Iodinated diagnostic agents and Bactrim [sulfamethoxazole-trimethoprim]   Social History   Socioeconomic History   Marital status: Divorced    Spouse name: Not on file   Number of children: Not on file   Years of education: Not on file   Highest education level: Not on file  Occupational History   Not on file  Tobacco Use   Smoking status: Never   Smokeless tobacco: Never  Vaping Use   Vaping Use: Never used  Substance and Sexual Activity   Alcohol use: No    Alcohol/week: 0.0 standard drinks   Drug use: No   Sexual activity: Not Currently  Other Topics Concern   Not on file  Social History Narrative   Not on file   Social Determinants of Health   Financial Resource Strain: Not on file  Food Insecurity: No Food Insecurity   Worried About Running Out of Food in the Last Year: Never true   [place] in the Last Year: Never true  Transportation Needs: No Transportation Needs   Lack of Transportation (Medical): No   Lack of Transportation (Non-Medical): No  Physical Activity: Not on file  Stress: Not on file  Social Connections: Not on file     Family History: The patient's family history includes Cancer in her mother; Diabetes in her mother; Heart disease in her father. There is no history of Breast cancer, Kidney cancer, or Bladder Cancer.  ROS:   Please see the history of present illness.     All other systems reviewed and are negative.  EKGs/Labs/Other Studies Reviewed:    The following studies were reviewed today:   EKG:  EKG is  ordered today.  The ekg ordered today demonstrates normal sinus rhythm, cannot rule out anterolateral infarct  Recent Labs: 03/09/2020: Hemoglobin 13.6; Platelets 154 08/12/2020: ALT 21; BUN 9; Creatinine, Ser 0.90; Potassium 3.8; Sodium 138  Recent Lipid Panel    Component Value Date/Time   CHOL 155 08/12/2020 1033    TRIG 238 (H) 08/12/2020 1033   HDL 33 (L) 08/12/2020 1033   CHOLHDL 4.7 (H) 08/12/2020 1033   LDLCALC 82 08/12/2020 1033     Risk Assessment/Calculations:          Physical Exam:    VS:  BP 126/82 (BP Location: Left Arm, Patient Position: Sitting, Cuff Size: Large)   Pulse 82   Ht 5' 7.5" (1.715 m)   Wt 229 lb (103.9 kg)   SpO2 97%   BMI 35.34 kg/m     Wt Readings from Last 3 Encounters:  10/08/20 229 lb (103.9 kg)  09/15/20 232 lb (105.2 kg)  08/19/20 235 lb (106.6 kg)     GEN:  Well nourished, well developed in no  acute distress HEENT: Normal NECK: No JVD; No carotid bruits LYMPHATICS: No lymphadenopathy CARDIAC: RRR, no murmurs, rubs, gallops RESPIRATORY:  Clear to auscultation without rales, wheezing or rhonchi  ABDOMEN: Soft, non-tender, non-distended MUSCULOSKELETAL:  No edema; No deformity  SKIN: Warm and dry NEUROLOGIC:  Alert and oriented x 3 PSYCHIATRIC:  Normal affect   ASSESSMENT:    1. Precordial pain   2. Mixed hyperlipidemia    PLAN:    In order of problems listed above:  Chest pain, risk factors hyperlipidemia, family history of CAD.  Get echo, myoview to evaluate presence of ischemia.  Provide sublingual nitroglycerin as needed for chest pain. Mixed hyperlipidemia, continue Lipitor, start fenofibrate.  Follow-up after echo and myoview  Shared Decision Making/Informed Consent The risks [chest pain, shortness of breath, cardiac arrhythmias, dizziness, blood pressure fluctuations, myocardial infarction, stroke/transient ischemic attack, nausea, vomiting, allergic reaction, radiation exposure, metallic taste sensation and life-threatening complications (estimated to be 1 in 10,000)], benefits (risk stratification, diagnosing coronary artery disease, treatment guidance) and alternatives of a nuclear stress test were discussed in detail with Ms. Benedick and she agrees to proceed.      Medication Adjustments/Labs and Tests Ordered: Current  medicines are reviewed at length with the patient today.  Concerns regarding medicines are outlined above.  Orders Placed This Encounter  Procedures   NM Myocar Multi W/Spect W/Wall Motion / EF   EKG 12-Lead   ECHOCARDIOGRAM COMPLETE    Meds ordered this encounter  Medications   fenofibrate (TRICOR) 145 MG tablet    Sig: Take 1 tablet (145 mg total) by mouth daily.    Dispense:  30 tablet    Refill:  5   nitroGLYCERIN (NITROSTAT) 0.4 MG SL tablet    Sig: Place 1 tablet (0.4 mg total) under the tongue every 5 (five) minutes as needed for chest pain. For a maximum of 3 doses in one day.    Dispense:  30 tablet    Refill:  3     Patient Instructions  Medication Instructions:   Your physician has recommended you make the following change in your medication:    START taking Fenofibrate 145 MG once a day.  2.    START taking Nitroglycerin 0.4 MG under the tongue every 5 minutes as needed for chest pain, for a maximum of 3 doses in one day.    *If you need a refill on your cardiac medications before your next appointment, please call your pharmacy*   Lab Work: None ordered If you have labs (blood work) drawn today and your tests are completely normal, you will receive your results only by: [place] (if you have MyChart) OR A paper copy in the mail If you have any lab test that is abnormal or we need to change your treatment, we will call you to review the results.   Testing/Procedures:    Your physician has requested that you have an echocardiogram. Echocardiography is a painless test that uses sound waves to create images of your heart. It provides your doctor with information about the size and shape of your heart and how well your heart's chambers and valves are working. This procedure takes approximately one hour. There are no restrictions for this procedure.  2.     [place]       Your caregiver has ordered a Stress Test with nuclear imaging. The purpose of  this test is to evaluate the blood supply to your heart muscle. This procedure is referred to as  a "Non-Invasive Stress Test." This is because other than having an IV started in your vein, nothing is inserted or "invades" your body. Cardiac stress tests are done to find areas of poor blood flow to the heart by determining the extent of coronary artery disease (CAD). Some patients exercise on a treadmill, which naturally increases the blood flow to your heart, while others who are  unable to walk on a treadmill due to physical limitations have a pharmacologic/chemical stress agent called Lexiscan . This medicine will mimic walking on a treadmill by temporarily increasing your coronary blood flow.      PLEASE REPORT TO [facility] MEDICAL MALL ENTRANCE   THE VOLUNTEERS AT THE FIRST DESK WILL DIRECT YOU WHERE TO GO     *Please note: these test may take anywhere between 2-4 hours to complete       Date of Procedure:_____________________________________   Arrival Time for Procedure:______________________________    PLEASE NOTIFY THE OFFICE AT LEAST 24 HOURS IN ADVANCE IF YOU ARE UNABLE TO KEEP YOUR APPOINTMENT.  [place] 24 HOURS IN ADVANCE IF YOU ARE UNABLE TO KEEP YOUR APPOINTMENT. [phone]      How to prepare for your Myoview test:    _XX___:  Hold other medications as follows: furosemide (LASIX)     1. Do not eat or drink after midnight  2. No caffeine for 24 hours prior to test  3. No smoking 24 hours prior to test.  4. Unless instructed otherwise, Take your medication with a small sips of water.    5.         Ladies, please do not wear dresses. Skirts or pants are appropriate. Please wear a short sleeve shirt.  6. No perfume, cologne or lotion.  7. Wear comfortable walking shoes. No heels!          Follow-Up: At [facility], you and your health needs are our priority.  As part of our continuing mission to provide you with  exceptional heart care, we have created designated Provider Care Teams.  These Care Teams include your primary Cardiologist (physician) and Advanced Practice Providers (APPs -  Physician Assistants and Nurse Practitioners) who all work together to provide you with the care you need, when you need it.  We recommend signing up for the patient portal called "MyChart".  Sign up information is provided on this After Visit Summary.  MyChart is used to connect with patients for Virtual Visits (Telemedicine).  Patients are able to view lab/test results, encounter notes, upcoming appointments, etc.  Non-urgent messages can be sent to your provider as well.   To learn more about what you can do with MyChart, go to [email or web address].    Your next appointment:   Follow up testing   The format for your next appointment:   In Person  Provider:   Kate Sable, MD   Other Instructions    Signed, Kate Sable, MD  10/08/2020 4:27 PM    [place]

## 2020-10-21 ENCOUNTER — Other Ambulatory Visit: Payer: Self-pay

## 2020-10-21 ENCOUNTER — Encounter
Admission: RE | Admit: 2020-10-21 | Discharge: 2020-10-21 | Disposition: A | Discharge status: Home / Self Care | Payer: Medicare HMO | Source: Ambulatory Visit | Attending: Cardiology | Admitting: Cardiology

## 2020-10-21 DIAGNOSIS — I209 Angina pectoris, unspecified: Secondary | ICD-10-CM | POA: Diagnosis not present

## 2020-10-21 DIAGNOSIS — R072 Precordial pain: Secondary | ICD-10-CM

## 2020-10-21 LAB — NM MYOCAR MULTI W/SPECT W/WALL MOTION / EF
Estimated workload: 1 METS
Exercise duration (min): 0 min
Exercise duration (sec): 0 s
LV dias vol: 64 mL (ref 46–106)
LV sys vol: 22 mL
MPHR: 154 {beats}/min
Peak HR: 101 {beats}/min
Percent HR: 65 %
Rest HR: 57 {beats}/min
SDS: 1
SRS: 7
SSS: 3
TID: 1.07

## 2020-10-21 MED ORDER — TECHNETIUM TC 99M TETROFOSMIN IV KIT
30.0000 | PACK | Freq: Once | INTRAVENOUS | Status: AC | PRN
  Administered 2020-10-21: 31.61 via INTRAVENOUS

## 2020-10-21 MED ORDER — REGADENOSON 0.4 MG/5ML IV SOLN
0.4000 mg | Freq: Once | INTRAVENOUS | Status: AC
  Administered 2020-10-21: 0.4 mg via INTRAVENOUS

## 2020-10-21 MED ORDER — TECHNETIUM TC 99M TETROFOSMIN IV KIT
9.8300 | PACK | Freq: Once | INTRAVENOUS | Status: AC | PRN
  Administered 2020-10-21: 9.83 via INTRAVENOUS

## 2020-10-25 ENCOUNTER — Telehealth: Payer: Self-pay | Admitting: *Deleted

## 2020-10-25 NOTE — Telephone Encounter (Signed)
Left voicemail message to call back for review of results.  

## 2020-10-25 NOTE — Telephone Encounter (Signed)
-----   Message from Kate Sable, MD sent at 10/25/2020  1:55 PM EDT ----- No significant ischemia noted on stress test, low risk scan.  Continue meds as prescribed

## 2020-10-26 NOTE — Telephone Encounter (Signed)
Left detailed message of results on voicemail (DPR approved). Asked pt to call back with any further questions.

## 2020-10-29 ENCOUNTER — Other Ambulatory Visit: Payer: Self-pay | Admitting: Physician Assistant

## 2020-11-01 ENCOUNTER — Telehealth: Payer: Self-pay | Admitting: Family Medicine

## 2020-11-01 ENCOUNTER — Encounter: Payer: Self-pay | Admitting: Urology

## 2020-11-01 NOTE — Telephone Encounter (Signed)
Left voice mail and informed patient that I reached out to [employer] for the paperwork to be faxed to my attention. I will reach out as soon as the paperwork has been received and faxed back.

## 2020-11-02 ENCOUNTER — Other Ambulatory Visit: Payer: Self-pay | Admitting: Family Medicine

## 2020-11-02 DIAGNOSIS — N9989 Other postprocedural complications and disorders of genitourinary system: Secondary | ICD-10-CM | POA: Diagnosis not present

## 2020-11-02 DIAGNOSIS — C541 Malignant neoplasm of endometrium: Secondary | ICD-10-CM | POA: Diagnosis not present

## 2020-11-02 DIAGNOSIS — R32 Unspecified urinary incontinence: Secondary | ICD-10-CM | POA: Diagnosis not present

## 2020-11-02 MED ORDER — FLUCONAZOLE 150 MG PO TABS: 150.0000 mg | ORAL_TABLET | Freq: Once | ORAL | 0 refills | Status: AC

## 2020-11-11 ENCOUNTER — Ambulatory Visit (INDEPENDENT_AMBULATORY_CARE_PROVIDER_SITE_OTHER): Payer: Medicare HMO | Admitting: Family Medicine

## 2020-11-11 ENCOUNTER — Other Ambulatory Visit: Payer: Self-pay

## 2020-11-11 ENCOUNTER — Encounter: Payer: Self-pay | Admitting: Family Medicine

## 2020-11-11 ENCOUNTER — Telehealth: Payer: Self-pay

## 2020-11-11 ENCOUNTER — Other Ambulatory Visit: Payer: Self-pay | Admitting: Family Medicine

## 2020-11-11 VITALS — BP 107/73 | HR 86 | Temp 98.2°F | Resp 16 | Ht 67.5 in | Wt 228.0 lb

## 2020-11-11 DIAGNOSIS — E785 Hyperlipidemia, unspecified: Secondary | ICD-10-CM | POA: Diagnosis not present

## 2020-11-11 DIAGNOSIS — E1142 Type 2 diabetes mellitus with diabetic polyneuropathy: Secondary | ICD-10-CM

## 2020-11-11 DIAGNOSIS — R600 Localized edema: Secondary | ICD-10-CM | POA: Diagnosis not present

## 2020-11-11 DIAGNOSIS — E1159 Type 2 diabetes mellitus with other circulatory complications: Secondary | ICD-10-CM

## 2020-11-11 DIAGNOSIS — R5383 Other fatigue: Secondary | ICD-10-CM | POA: Diagnosis not present

## 2020-11-11 DIAGNOSIS — E1169 Type 2 diabetes mellitus with other specified complication: Secondary | ICD-10-CM | POA: Diagnosis not present

## 2020-11-11 DIAGNOSIS — I152 Hypertension secondary to endocrine disorders: Secondary | ICD-10-CM | POA: Diagnosis not present

## 2020-11-11 LAB — POCT GLYCOSYLATED HEMOGLOBIN (HGB A1C)
Est. average glucose Bld gHb Est-mCnc: 203
Hemoglobin A1C: 8.7 % — AB (ref 4.0–5.6)

## 2020-11-11 MED ORDER — ONETOUCH VERIO VI STRP: ORAL_STRIP | 12 refills | Status: DC

## 2020-11-11 MED ORDER — OZEMPIC (0.25 OR 0.5 MG/DOSE) 2 MG/1.5ML ~~LOC~~ SOPN: PEN_INJECTOR | SUBCUTANEOUS | 5 refills | Status: AC

## 2020-11-11 MED ORDER — ONETOUCH DELICA LANCETS 33G MISC: 12 refills | Status: DC

## 2020-11-11 MED ORDER — ONETOUCH ULTRA 2 W/DEVICE KIT: PACK | 0 refills | Status: DC

## 2020-11-11 NOTE — Assessment & Plan Note (Addendum)
-   Chronic, currently well-controlled without medications - Will defer medication changes at this time due to recent concerns of orthostatic hypotension & fatigue - Recheck labs at next visit

## 2020-11-11 NOTE — Assessment & Plan Note (Signed)
-   Improved, with minimal use of Lasix in the past 3 months - Continue Lasix prn for acute exacerbations - Recommended the use of compression stockings prn for edema

## 2020-11-11 NOTE — Progress Notes (Signed)
Established patient visit   Patient: Jean Gonzales   DOB: 18-May-1955   65 y.o. Female  MRN: [id number] Visit Date: 11/11/2020  Today's healthcare provider: Lavon Paganini, MD   Chief Complaint  Patient presents with   Diabetes   Hyperlipidemia   Hypertension   Subjective    Diabetes - Pt. endorses recent increases in carb intake - Previously took Metformin, but was unable to tolerate GI side effects - Not currently taking any medications - Pt. requests new glucometer  Hyperlipidemia - On Tricor & atorvastatin, denies side effects  Hypertension - Pt. states that she takes HCTZ only prn; hasn't taken it in months - Reports that she takes Lasix prn; hasn't taken it in months - Endorses occasional orthostatic hypotension, but reports drinking 64 oz water daily  Fatigue - Pt. describes daily fatigue for the past 3-4 wks - Denies tachycardia, SOB, constipation, sleep disturbances, snoring, anhedonia - Denies past medical history of anemia, hypothyroidism, & sleep apnea     Medications: Outpatient Medications Prior to Visit  Medication Sig   acetaminophen (TYLENOL) 325 MG tablet Take 1-2 tablets (325-650 mg total) by mouth every 6 (six) hours as needed for mild pain (pain score 1-3 or temp > 100.5).   atorvastatin (LIPITOR) 40 MG tablet TAKE 1 TABLET BY MOUTH EVERY DAY IN THE MORNING   celecoxib (CELEBREX) 100 MG capsule TAKE 1 CAPSULE BY MOUTH TWICE A DAY   diclofenac sodium (VOLTAREN) 1 % GEL Apply 2 g topically 4 (four) times daily. Apply to knee   diclofenac Sodium (VOLTAREN) 1 % GEL APPLY 2 GRAMS TO AFFECTED AREA 4 TIMES A DAY *PA REQUIRED*   esomeprazole (NEXIUM) 40 MG capsule TAKE 1 CAPSULE BY MOUTH EVERY DAY   fenofibrate (TRICOR) 145 MG tablet Take 1 tablet (145 mg total) by mouth daily.   furosemide (LASIX) 20 MG tablet TAKE 1 TABLET BY MOUTH TWICE A DAY   nitrofurantoin (MACRODANTIN) 50 MG capsule 1 capsule daily.  Do not start until course of  amoxicillin has been completed   nitroGLYCERIN (NITROSTAT) 0.4 MG SL tablet Place 1 tablet (0.4 mg total) under the tongue every 5 (five) minutes as needed for chest pain. For a maximum of 3 doses in one day.   nystatin (MYCOSTATIN/NYSTOP) powder Apply topically 3 (three) times daily.   ondansetron (ZOFRAN ODT) 4 MG disintegrating tablet Take 1 tablet (4 mg total) by mouth every 8 (eight) hours as needed for nausea or vomiting.   tamsulosin (FLOMAX) 0.4 MG CAPS capsule TAKE 1 CAPSULE BY MOUTH EVERY DAY   triamcinolone ointment (KENALOG) 0.1 % APPLY TO AFFECTED AREA TWICE A DAY   Turmeric, Curcuma Longa, (CURCUMIN) POWD by Does not apply route. With ginger and black seed   zolpidem (AMBIEN) 10 MG tablet TAKE 1 TABLET BY MOUTH AT BEDTIME AS NEEDED FOR SLEEP.   [DISCONTINUED] amoxicillin (AMOXIL) 875 MG tablet Take 1 tablet (875 mg total) by mouth every 12 (twelve) hours. (Patient not taking: Reported on 10/08/2020)   No facility-administered medications prior to visit.    Review of Systems  Constitutional:  Positive for fatigue. Negative for activity change, chills and fever.  HENT: Negative.    Eyes: Negative.  Negative for visual disturbance.  Respiratory:  Negative for chest tightness and shortness of breath.   Cardiovascular:  Negative for chest pain and leg swelling.  Gastrointestinal: Negative.  Negative for constipation.  Endocrine: Positive for polydipsia and polyuria. Negative for cold intolerance.  Genitourinary:  Positive for difficulty urinating.  Musculoskeletal: Negative.   Skin: Negative.   Neurological:  Positive for light-headedness. Negative for weakness.  Psychiatric/Behavioral: Negative.  Negative for dysphoric mood and sleep disturbance.       Objective    BP 107/73 (BP Location: Left Arm, Patient Position: Sitting, Cuff Size: Large)   Pulse 86   Temp 98.2 F (36.8 C) (Oral)   Resp 16   Ht 5' 7.5" (1.715 m)   Wt 228 lb (103.4 kg)   BMI 35.18 kg/m      Physical Exam Constitutional:      Appearance: She is obese.  HENT:     Head: Normocephalic and atraumatic.     Right Ear: External ear normal.     Left Ear: External ear normal.  Eyes:     Conjunctiva/sclera: Conjunctivae normal.  Cardiovascular:     Rate and Rhythm: Normal rate and regular rhythm.     Pulses: Normal pulses.     Heart sounds: Normal heart sounds.  Pulmonary:     Effort: Pulmonary effort is normal.     Breath sounds: Normal breath sounds.  Abdominal:     General: Abdomen is flat.     Palpations: Abdomen is soft.  Skin:    General: Skin is warm and dry.  Neurological:     Mental Status: She is alert.  Psychiatric:        Behavior: Behavior normal.        Thought Content: Thought content normal.     Results for orders placed or performed in visit on 11/11/20  POCT glycosylated hemoglobin (Hb A1C)  Result Value Ref Range   Hemoglobin A1C 8.7 (A) 4.0 - 5.6 %   Est. average glucose Bld gHb Est-mCnc 203     Assessment & Plan     Problem List Items Addressed This Visit       Cardiovascular and Mediastinum   Hypertension associated with diabetes ([place])    - Chronic, currently well-controlled without medications - Will defer medication changes at this time due to recent concerns of orthostatic hypotension & fatigue - Recheck labs at next visit      Relevant Medications   Semaglutide,0.25 or 0.5MG/DOS, (OZEMPIC, 0.25 OR 0.5 MG/DOSE,) 2 MG/1.5ML SOPN     Endocrine   Diabetes mellitus ([place]) - Primary    - Poorly controlled, elevated A1c with hyperglycemia - Start Ozempic - Replace glucometer - UTD on foot exam - Discussed need for annual eye exam - On Statin - Discussed diet and exercise - F/u in 3 months       Relevant Medications   Semaglutide,0.25 or 0.5MG/DOS, (OZEMPIC, 0.25 OR 0.5 MG/DOSE,) 2 MG/1.5ML SOPN   Blood Glucose Monitoring Suppl (ONE TOUCH ULTRA 2) w/Device KIT   OneTouch Delica Lancets 02I MISC   glucose blood (ONETOUCH VERIO)  test strip   Other Relevant Orders   POCT glycosylated hemoglobin (Hb A1C) (Completed)   Microalbumin, urine   Hyperlipidemia associated with type 2 diabetes mellitus (HCC)    - Chronic and stable - Continue atorvastatin & Tricor - Will defer labs until next visit      Relevant Medications   Semaglutide,0.25 or 0.5MG/DOS, (OZEMPIC, 0.25 OR 0.5 MG/DOSE,) 2 MG/1.5ML SOPN     Other   Morbid obesity (HCC)    - BMI >35 and associated with DM, HTN, HLD - Discussed importance of healthy weight management - Discussed diet and exercise  - Start Ozempic      Relevant Medications  Semaglutide,0.25 or 0.5MG/DOS, (OZEMPIC, 0.25 OR 0.5 MG/DOSE,) 2 MG/1.5ML SOPN   Bilateral edema of lower extremity    - Improved, with minimal use of Lasix in the past 3 months - Continue Lasix prn for acute exacerbations - Recommended the use of compression stockings prn for edema      Fatigue    - Progressive over the past 3-4 weeks - Low suspicion for iron deficiency anemia, hypothyroidism, depression, & sleep apnea given history, ROS, & recent labs - Possibly due to recent episodes of hyperglycemia - Start Ozempic - Will continue to monitor, f/u in 3 months        Discussed that Urology will continue to manage her urinary complaints, including recurrent UTIs  Return in about 3 months (around 02/11/2021) for chronic disease f/u, With new PCP.       Percell Locus, MS3    Patient seen along with MS3 student Percell Locus. I personally evaluated this patient along with the student, and verified all aspects of the history, physical exam, and medical decision making as documented by the student. I agree with the student's documentation and have made all necessary edits.  Lonzell Dorris, Dionne Bucy, MD, MPH [facility]

## 2020-11-11 NOTE — Telephone Encounter (Signed)
Copied from [place] [id number]. Topic: General - Call Back - No Documentation >> Nov 11, 2020  4:57 PM Erick Blinks wrote: Reason for CRM: Pt called reporting that she missed a call from Judson Roch and is requesting a call back  Best contact: [phone]

## 2020-11-11 NOTE — Assessment & Plan Note (Addendum)
-   Progressive over the past 3-4 weeks - Low suspicion for iron deficiency anemia, hypothyroidism, depression, & sleep apnea given history, ROS, & recent labs - Possibly due to recent episodes of hyperglycemia - Start Ozempic - Will continue to monitor, f/u in 3 months

## 2020-11-11 NOTE — Assessment & Plan Note (Signed)
-   BMI >35 and associated with DM, HTN, HLD - Discussed importance of healthy weight management - Discussed diet and exercise  - Start Ozempic

## 2020-11-11 NOTE — Assessment & Plan Note (Addendum)
-   Poorly controlled, elevated A1c with hyperglycemia - Start Ozempic - Replace glucometer - UTD on foot exam - Discussed need for annual eye exam - On Statin - Discussed diet and exercise - F/u in 3 months

## 2020-11-11 NOTE — Telephone Encounter (Signed)
Requested medication (s) are due for refill today:   Prescribed today by Dr. Burnard Hawthorne  Requested medication (s) are on the active medication list:   Prescribed today  Future visit scheduled:   Yes    Last ordered: today Pharmacy needing specific testing frequency to bill to medicare part B - reason returned   Requested Prescriptions  Pending Prescriptions Disp Refills   Lancets (ONETOUCH DELICA PLUS [id number]) [place] [Pharmacy Med Name: ONE TOUCH DELICA PLUS [id number] LANC] [id number] each 12    Sig: USE AS DIRECTED     Endocrinology: Diabetes - Testing Supplies Passed - 11/11/2020 12:21 PM      Passed - Valid encounter within last 12 months    Recent Outpatient Visits           Today Type 2 diabetes mellitus with diabetic polyneuropathy, without long-term current use of insulin [facility])   [place], Dionne Bucy, MD   3 months ago Type 2 diabetes mellitus with diabetic polyneuropathy, without long-term current use of insulin ([place])   [facility] [place], Dionne Bucy, MD   4 months ago Acute cystitis without hematuria   [place], Laurita Quint, FNP   4 months ago Acute cystitis without hematuria   [facility], Dionne Bucy, MD   6 months ago Type 2 diabetes mellitus with diabetic polyneuropathy, without long-term current use of insulin [facility])   [place], Clearnce Sorrel, [place]       Future Appointments             In 2 weeks Ag[place]-Et[place], Aaron Edelman, MD [facility], [facility]   In 1 month Stoioff, Ronda Fairly, MD [place]   In 3 months Rollene Rotunda, Jaci Standard, [place], [facility]

## 2020-11-11 NOTE — Assessment & Plan Note (Signed)
-   Chronic and stable - Continue atorvastatin & Tricor - Will defer labs until next visit

## 2020-11-12 LAB — MICROALBUMIN, URINE: Microalbumin, Urine: 6.1 ug/mL

## 2020-11-25 ENCOUNTER — Other Ambulatory Visit: Payer: Self-pay

## 2020-11-25 ENCOUNTER — Ambulatory Visit: Payer: Medicare HMO

## 2020-11-26 ENCOUNTER — Ambulatory Visit: Payer: Medicare HMO | Admitting: Cardiology

## 2020-12-02 ENCOUNTER — Other Ambulatory Visit: Payer: Self-pay | Admitting: Physician Assistant

## 2020-12-02 DIAGNOSIS — N9989 Other postprocedural complications and disorders of genitourinary system: Secondary | ICD-10-CM

## 2020-12-07 ENCOUNTER — Encounter: Payer: Self-pay | Admitting: Urology

## 2020-12-08 ENCOUNTER — Other Ambulatory Visit: Payer: Self-pay | Admitting: *Deleted

## 2020-12-08 ENCOUNTER — Other Ambulatory Visit: Payer: Self-pay | Admitting: Physician Assistant

## 2020-12-08 DIAGNOSIS — N9989 Other postprocedural complications and disorders of genitourinary system: Secondary | ICD-10-CM

## 2020-12-08 DIAGNOSIS — I10 Essential (primary) hypertension: Secondary | ICD-10-CM

## 2020-12-08 MED ORDER — TAMSULOSIN HCL 0.4 MG PO CAPS
0.4000 mg | ORAL_CAPSULE | Freq: Every day | ORAL | 0 refills | Status: DC
Start: 2020-12-08 — End: 2021-05-16

## 2020-12-13 ENCOUNTER — Other Ambulatory Visit: Payer: Medicare HMO

## 2020-12-16 ENCOUNTER — Ambulatory Visit: Payer: Self-pay | Admitting: Urology

## 2020-12-20 ENCOUNTER — Ambulatory Visit: Payer: Medicare HMO | Admitting: Cardiology

## 2020-12-20 LAB — HM DIABETES EYE EXAM

## 2020-12-22 ENCOUNTER — Ambulatory Visit (INDEPENDENT_AMBULATORY_CARE_PROVIDER_SITE_OTHER): Payer: Medicare HMO | Admitting: Urology

## 2020-12-22 ENCOUNTER — Other Ambulatory Visit: Payer: Self-pay

## 2020-12-22 ENCOUNTER — Encounter: Payer: Self-pay | Admitting: Urology

## 2020-12-22 VITALS — BP 160/86 | HR 92 | Ht 67.5 in | Wt 222.0 lb

## 2020-12-22 DIAGNOSIS — N390 Urinary tract infection, site not specified: Secondary | ICD-10-CM

## 2020-12-22 DIAGNOSIS — R339 Retention of urine, unspecified: Secondary | ICD-10-CM | POA: Diagnosis not present

## 2020-12-22 NOTE — Progress Notes (Signed)
12/22/2020 7:18 AM   Jean Gonzales 02/24/1956 [id number]  Referring provider: Mar Daring, PA-C [address] [place],  [place] [address]  Chief Complaint  Patient presents with   Recurrent UTI    Urological history: 1. Urinary retention -managed with CIC - recommended to CIC three times daily   2. rUTI's -contributing factors of age, vaginal atrophy, diarrhea, immunosuppressants and incomplete bladder emptying -documented positive urine cultures over the last year             E. coli resistant to ampicillin, cefuroxime, tetracycline and trimethoprim/sulfa and Klebsiella pneumoniae resistant to ampicillin, tetracycline and trimethoprim/sulfa on July 14, 2020             Klebsiella pneumoniae resistant to ampicillin and trimethoprim/sulfa June 28, 2020             Pansensitive E. coli on March 09, 2020             E. coli resistant to ampicillin, tetracycline and trimethoprim/sulfa on October 06, 2019             -non-contrast CT 02/2020 Mildly thick-walled appearance of the urinary bladder, question cystitis.   3. Vaginal atrophy -has a history of endometrial carcinoma    4. Urethral stenosis -secondary to pelvic surgery for endometrial cancer -managed with CIC   HPI: 65 y.o. female presents for 68-monthfollow-up visit.  Last seen June 2022 and cystoscopy was unremarkable Placed on low-dose prophylaxis with nitrofurantoin which she has continued Feels she has had an ongoing infection since that time Decreased her intermittent catheterization to once daily with residuals between 500-700 cc Epic review with 1+ culture for Enterococcus late July 2022 No fever/chills Presently asymptomatic with frequency, urgency, dysuria  PMH: Past Medical History:  Diagnosis Date   Ankylosing spondylitis ([place]    Arthritis    osteoarthritis right knee   Cancer ([place] 2014   endometrial CA   Diabetes mellitus without complication ([place]    diet controlled    Endometrial cancer ([place]    GERD (gastroesophageal reflux disease)    Hyperlipidemia    Hypertension    improved after ca tx    Surgical History: Past Surgical History:  Procedure Laterality Date   ABDOMINAL HYSTERECTOMY  2015   cancer   BREAST BIOPSY Right 2008ish   core   CATARACT EXTRACTION W/PHACO Right 02/05/2018   Procedure: CATARACT EXTRACTION PHACO AND INTRAOCULAR LENS PLACEMENT ([place] RIGHT DIABETES IVA TOPICAL;  Surgeon: KEulogio Bear MD;  [facility]  Service: Ophthalmology;  Laterality: Right;   CATARACT EXTRACTION W/PHACO Left 02/25/2018   Procedure: CATARACT EXTRACTION PHACO AND INTRAOCULAR LENS PLACEMENT ([place] LEFT DIABETIC;  Surgeon: KEulogio Bear MD;  [facility]  Service: Ophthalmology;  Laterality: Left;   CHOLECYSTECTOMY  1992   COLONOSCOPY WITH PROPOFOL N/A 08/08/2019   Procedure: COLONOSCOPY WITH PROPOFOL;  Surgeon: AJonathon Bellows MD;  [facility];  Service: Gastroenterology;  Laterality: N/A;   EYE SURGERY     TONSILLECTOMY  1973   TONSILLECTOMY     TOTAL KNEE ARTHROPLASTY Right 07/09/2017   Procedure: TOTAL KNEE ARTHROPLASTY;  Surgeon: BLovell Sheehan MD;  Location: [facility];  Service: Orthopedics;  Laterality: Right;    Home Medications:  Allergies as of 12/22/2020       Reactions   Iodinated Diagnostic Agents Anaphylaxis   Metformin And Related Diarrhea   Bactrim [sulfamethoxazole-trimethoprim] Itching, Rash        Medication List  Accurate as of December 22, 2020 11:59 PM. If you have any questions, ask your nurse or doctor.          acetaminophen 325 MG tablet Commonly known as: TYLENOL Take 1-2 tablets (325-650 mg total) by mouth every 6 (six) hours as needed for mild pain (pain score 1-3 or temp > 100.5).   atorvastatin 40 MG tablet Commonly known as: LIPITOR TAKE 1 TABLET BY MOUTH EVERY DAY IN THE MORNING   celecoxib 100 MG capsule Commonly known as:  CELEBREX TAKE 1 CAPSULE BY MOUTH TWICE A DAY   Curcumin Powd by Does not apply route. With ginger and black seed   diclofenac sodium 1 % Gel Commonly known as: VOLTAREN Apply 2 g topically 4 (four) times daily. Apply to knee   esomeprazole 40 MG capsule Commonly known as: NEXIUM TAKE 1 CAPSULE BY MOUTH EVERY DAY   fenofibrate 145 MG tablet Commonly known as: Tricor Take 1 tablet (145 mg total) by mouth daily.   furosemide 20 MG tablet Commonly known as: LASIX TAKE 1 TABLET BY MOUTH TWICE A DAY   nitrofurantoin 50 MG capsule Commonly known as: MACRODANTIN 1 capsule daily.  Do not start until course of amoxicillin has been completed   nitroGLYCERIN 0.4 MG SL tablet Commonly known as: NITROSTAT Place 1 tablet (0.4 mg total) under the tongue every 5 (five) minutes as needed for chest pain. For a maximum of 3 doses in one day.   nystatin powder Commonly known as: MYCOSTATIN/NYSTOP Apply topically 3 (three) times daily.   ondansetron 4 MG disintegrating tablet Commonly known as: Zofran ODT Take 1 tablet (4 mg total) by mouth every 8 (eight) hours as needed for nausea or vomiting.   ONE TOUCH ULTRA 2 w/Device Kit Use as directed   OneTouch Delica Lancets 32K Misc Use as directed   OneTouch Verio test strip Generic drug: glucose blood Use as instructed   tamsulosin 0.4 MG Caps capsule Commonly known as: FLOMAX Take 1 capsule (0.4 mg total) by mouth daily.   triamcinolone ointment 0.1 % Commonly known as: KENALOG APPLY TO AFFECTED AREA TWICE A DAY   zolpidem 10 MG tablet Commonly known as: AMBIEN TAKE 1 TABLET BY MOUTH AT BEDTIME AS NEEDED FOR SLEEP.        Allergies:  Allergies  Allergen Reactions   Iodinated Diagnostic Agents Anaphylaxis   Metformin And Related Diarrhea   Bactrim [Sulfamethoxazole-Trimethoprim] Itching and Rash    Family History: Family History  Problem Relation Age of Onset   Diabetes Mother    Cancer Mother    Heart disease  Father    Breast cancer Neg Hx    Kidney cancer Neg Hx    Bladder Cancer Neg Hx     Social History:  reports that she has never smoked. She has never used smokeless tobacco. She reports that she does not drink alcohol and does not use drugs.   Physical Exam: BP (!) 160/86   Pulse 92   Ht 5' 7.5" (1.715 m)   Wt 222 lb (100.7 kg)   BMI 34.26 kg/m   Constitutional:  Alert and oriented, No acute distress. HEENT: [place] AT, moist mucus membranes.  Trachea midline, no masses. Cardiovascular: No clubbing, cyanosis, or edema. Respiratory: Normal respiratory effort, no increased work of breathing.   Laboratory Data:  Urinalysis 12/22/2020: Dipstick 1+ leukocyte/nitrite positive; microscopy >30 WBC/many bacteria   Assessment & Plan:    1.  Recurrent UTI Persistent symptomatic infection which may be secondary  to the need for intermittent catheterization.  Decreasing the amount she catheterizes did not make any difference Upper tract imaging December 2021 showed no abnormalities Has been on low-dose antibiotic prophylaxis x3 months Urine culture ordered Will ask Dr. Matilde Sprang to see her for a second opinion   [name], MD  [facility] [address] [place], [place] [address] [phone]

## 2020-12-23 ENCOUNTER — Encounter: Payer: Self-pay | Admitting: Urology

## 2020-12-23 LAB — MICROSCOPIC EXAMINATION: WBC, UA: >30 /hpf — AB (ref 0–5)

## 2020-12-23 LAB — URINALYSIS, COMPLETE
Bilirubin, UA: NEGATIVE
Glucose, UA: NEGATIVE
Ketones, UA: NEGATIVE
Nitrite, UA: POSITIVE — AB
Protein,UA: NEGATIVE
RBC, UA: NEGATIVE
Specific Gravity, UA: 1.015 (ref 1.005–1.030)
Urobilinogen, Ur: 0.2 mg/dL (ref 0.2–1.0)
pH, UA: 6 (ref 5.0–7.5)

## 2020-12-29 ENCOUNTER — Other Ambulatory Visit: Payer: Self-pay | Admitting: Physician Assistant

## 2020-12-29 DIAGNOSIS — L4050 Arthropathic psoriasis, unspecified: Secondary | ICD-10-CM

## 2020-12-29 DIAGNOSIS — M069 Rheumatoid arthritis, unspecified: Secondary | ICD-10-CM

## 2020-12-30 ENCOUNTER — Other Ambulatory Visit: Payer: Self-pay | Admitting: Urology

## 2020-12-30 DIAGNOSIS — N9989 Other postprocedural complications and disorders of genitourinary system: Secondary | ICD-10-CM

## 2020-12-31 ENCOUNTER — Encounter: Payer: Self-pay | Admitting: Family Medicine

## 2020-12-31 DIAGNOSIS — L4050 Arthropathic psoriasis, unspecified: Secondary | ICD-10-CM

## 2020-12-31 DIAGNOSIS — M069 Rheumatoid arthritis, unspecified: Secondary | ICD-10-CM

## 2020-12-31 MED ORDER — CELECOXIB 100 MG PO CAPS: 100.0000 mg | ORAL_CAPSULE | Freq: Two times a day (BID) | ORAL | 1 refills | Status: DC

## 2020-12-31 NOTE — Telephone Encounter (Signed)
LOV 11/11/2020 NOV: 02/16/2021  Last Refill: 09/27/2019 #180 1 Refill  Thanks,   Mickel Baas

## 2021-01-02 LAB — CULTURE, URINE COMPREHENSIVE

## 2021-01-03 ENCOUNTER — Other Ambulatory Visit: Payer: Self-pay | Admitting: Urology

## 2021-01-03 ENCOUNTER — Telehealth: Payer: Self-pay | Admitting: Urology

## 2021-01-03 MED ORDER — DOXYCYCLINE HYCLATE 100 MG PO CAPS: 100.0000 mg | ORAL_CAPSULE | Freq: Two times a day (BID) | ORAL | 0 refills | Status: DC

## 2021-01-03 NOTE — Telephone Encounter (Signed)
Urine culture positive and resistant to nitrofurantoin.  Discontinue the nitrofurantoin suppression.  I sent in Rx doxycycline twice daily x7 days.

## 2021-01-03 NOTE — Telephone Encounter (Signed)
Notified patient as instructed, patient pleased. Discussed follow-up appointments, patient agrees  

## 2021-01-10 ENCOUNTER — Encounter: Payer: Self-pay | Admitting: Urology

## 2021-01-10 ENCOUNTER — Ambulatory Visit (INDEPENDENT_AMBULATORY_CARE_PROVIDER_SITE_OTHER): Payer: Medicare HMO | Admitting: Urology

## 2021-01-10 ENCOUNTER — Other Ambulatory Visit: Payer: Self-pay

## 2021-01-10 VITALS — BP 134/82 | Ht 67.5 in | Wt 222.0 lb

## 2021-01-10 DIAGNOSIS — N390 Urinary tract infection, site not specified: Secondary | ICD-10-CM

## 2021-01-10 DIAGNOSIS — N3020 Other chronic cystitis without hematuria: Secondary | ICD-10-CM

## 2021-01-10 LAB — MICROSCOPIC EXAMINATION: RBC, Urine: NONE SEEN /hpf (ref 0–2)

## 2021-01-10 LAB — URINALYSIS, COMPLETE
Bilirubin, UA: NEGATIVE
Glucose, UA: NEGATIVE
Ketones, UA: NEGATIVE
Nitrite, UA: NEGATIVE
Protein,UA: NEGATIVE
RBC, UA: NEGATIVE
Specific Gravity, UA: 1.01 (ref 1.005–1.030)
Urobilinogen, Ur: 0.2 mg/dL (ref 0.2–1.0)
pH, UA: 6 (ref 5.0–7.5)

## 2021-01-10 MED ORDER — CEPHALEXIN 250 MG PO CAPS: 250.0000 mg | ORAL_CAPSULE | Freq: Every day | ORAL | 11 refills | Status: DC

## 2021-01-10 NOTE — Progress Notes (Signed)
01/10/2021 2:59 PM   Jean Gonzales January 05, 1956 [id number]  Referring provider: [name], [address],  [place] [address]  Chief Complaint  Patient presents with   Recurrent UTI    HPI: [place]: Recurrent urinary tract infections with multiple positive cultures.  Vaginal atrophy with history of endometrial carcinoma.  She may have urethral stenosis from pelvic surgery.  Had normal cystoscopy and was on low-dose Macrodantin.  She catheterizes once a day and gets residuals of 500 to 700 mL.  Has been given Flomax in the past.  Normal renal x-ray December 2021  The patient had a hysterectomy followed by chemotherapy and radiation for endometrial carcinoma about 6 years ago.  It appears last 2 years she was being dilated at a rate such that she started to catheterize even daily.  Now she catheterizes once a day.  She could not get 500 cc to a liter.  She generally voids 3-4 times a day only a few cc.  She gets up once a night.  When she voids her flow was weak.  She hesitates.  She does not strain.  She does not feel empty  She gets recurrent bladder infections with dark cloudy foul-smelling urine that respond favorably antibiotics.  She is in serious infection at least monthly or more often.  Few days later symptoms returned.  No pain or fever  Her urethral stenosis has dated back before 2019 when I reviewed the consult note.  She has rare incontinence and wears a pad for confidence.  Patient was just treated with doxycycline and I was retreating herself.  She is convinced that antibiotics are not working.  It appears she had been on Macrodantin for 3 months but others have not been tried       PMH: Past Medical History:  Diagnosis Date   Ankylosing spondylitis ([place])    Arthritis    osteoarthritis right knee   Cancer ([place]) 2014   endometrial CA   Diabetes mellitus without complication ([place])    diet controlled   Endometrial cancer ([place])    GERD  (gastroesophageal reflux disease)    Hyperlipidemia    Hypertension    improved after ca tx    Surgical History: Past Surgical History:  Procedure Laterality Date   ABDOMINAL HYSTERECTOMY  2015   cancer   BREAST BIOPSY Right 2008ish   core   CATARACT EXTRACTION W/PHACO Right 02/05/2018   Procedure: CATARACT EXTRACTION PHACO AND INTRAOCULAR LENS PLACEMENT ([place]) RIGHT DIABETES IVA TOPICAL;  Surgeon: Eulogio Bear, MD;  Location: [facility];  Service: Ophthalmology;  Laterality: Right;   CATARACT EXTRACTION W/PHACO Left 02/25/2018   Procedure: CATARACT EXTRACTION PHACO AND INTRAOCULAR LENS PLACEMENT ([place]) LEFT DIABETIC;  Surgeon: Eulogio Bear, MD;  Location: [facility];  Service: Ophthalmology;  Laterality: Left;   CHOLECYSTECTOMY  1992   COLONOSCOPY WITH PROPOFOL N/A 08/08/2019   Procedure: COLONOSCOPY WITH PROPOFOL;  Surgeon: Jonathon Bellows, MD;  [facility];  Service: Gastroenterology;  Laterality: N/A;   EYE SURGERY     TONSILLECTOMY  1973   TONSILLECTOMY     TOTAL KNEE ARTHROPLASTY Right 07/09/2017   Procedure: TOTAL KNEE ARTHROPLASTY;  Surgeon: Lovell Sheehan, MD;  Location: [facility] ORS;  Service: Orthopedics;  Laterality: Right;    Home Medications:  Allergies as of 01/10/2021       Reactions   Iodinated Diagnostic Agents Anaphylaxis   Metformin And Related Diarrhea   Bactrim [sulfamethoxazole-trimethoprim] Itching, Rash  Medication List        Accurate as of January 10, 2021  2:59 PM. If you have any questions, ask your nurse or doctor.          STOP taking these medications    nitrofurantoin 50 MG capsule Commonly known as: MACRODANTIN Stopped by: Reece Packer, MD       TAKE these medications    acetaminophen 325 MG tablet Commonly known as: TYLENOL Take 1-2 tablets (325-650 mg total) by mouth every 6 (six) hours as needed for mild pain (pain score 1-3 or temp > 100.5).   atorvastatin 40 MG  tablet Commonly known as: LIPITOR TAKE 1 TABLET BY MOUTH EVERY DAY IN THE MORNING   celecoxib 100 MG capsule Commonly known as: CELEBREX Take 1 capsule (100 mg total) by mouth 2 (two) times daily.   Curcumin Powd by Does not apply route. With ginger and black seed   diclofenac sodium 1 % Gel Commonly known as: VOLTAREN Apply 2 g topically 4 (four) times daily. Apply to knee   doxycycline 100 MG tablet Commonly known as: VIBRA-TABS TAKE 1 TABLET BY MOUTH EVERY 12 HOURS   esomeprazole 40 MG capsule Commonly known as: NEXIUM TAKE 1 CAPSULE BY MOUTH EVERY DAY   fenofibrate 145 MG tablet Commonly known as: Tricor Take 1 tablet (145 mg total) by mouth daily.   fluconazole 150 MG tablet Commonly known as: DIFLUCAN Take 150 mg by mouth once.   furosemide 20 MG tablet Commonly known as: LASIX TAKE 1 TABLET BY MOUTH TWICE A DAY   hydrochlorothiazide 25 MG tablet Commonly known as: HYDRODIURIL Take 25 mg by mouth daily.   nitroGLYCERIN 0.4 MG SL tablet Commonly known as: NITROSTAT Place 1 tablet (0.4 mg total) under the tongue every 5 (five) minutes as needed for chest pain. For a maximum of 3 doses in one day.   nystatin powder Commonly known as: MYCOSTATIN/NYSTOP Apply topically 3 (three) times daily.   ondansetron 4 MG disintegrating tablet Commonly known as: Zofran ODT Take 1 tablet (4 mg total) by mouth every 8 (eight) hours as needed for nausea or vomiting.   ONE TOUCH ULTRA 2 w/Device Kit Use as directed   OneTouch Delica Lancets 70Y Misc Use as directed   OneTouch Verio test strip Generic drug: glucose blood Use as instructed   tamsulosin 0.4 MG Caps capsule Commonly known as: FLOMAX Take 1 capsule (0.4 mg total) by mouth daily.   triamcinolone ointment 0.1 % Commonly known as: KENALOG APPLY TO AFFECTED AREA TWICE A DAY   zolpidem 10 MG tablet Commonly known as: AMBIEN TAKE 1 TABLET BY MOUTH AT BEDTIME AS NEEDED FOR SLEEP.        Allergies:   Allergies  Allergen Reactions   Iodinated Diagnostic Agents Anaphylaxis   Metformin And Related Diarrhea   Bactrim [Sulfamethoxazole-Trimethoprim] Itching and Rash    Family History: Family History  Problem Relation Age of Onset   Diabetes Mother    Cancer Mother    Heart disease Father    Breast cancer Neg Hx    Kidney cancer Neg Hx    Bladder Cancer Neg Hx     Social History:  reports that she has never smoked. She has never used smokeless tobacco. She reports that she does not drink alcohol and does not use drugs.  ROS:  Physical Exam: BP 134/82   Ht 5' 7.5" (1.715 m)   Wt 100.7 kg   BMI 34.26 kg/m   Constitutional:  Alert and oriented, No acute distress. HEENT: [place] AT, moist mucus membranes.  Trachea midline, no masses. Cardiovascular: No clubbing, cyanosis, or edema.   Laboratory Data: Lab Results  Component Value Date   WBC 16.0 (H) 03/09/2020   HGB 13.6 03/09/2020   HCT 39.3 03/09/2020   MCV 87.1 03/09/2020   PLT 154 03/09/2020    Lab Results  Component Value Date   CREATININE 0.90 08/12/2020    No results found for: PSA  No results found for: TESTOSTERONE  Lab Results  Component Value Date   HGBA1C 8.7 (A) 11/11/2020    Urinalysis    Component Value Date/Time   COLORURINE YELLOW (A) 03/09/2020 1113   APPEARANCEUR Cloudy (A) 12/22/2020 1405   LABSPEC 1.009 03/09/2020 1113   PHURINE 6.0 03/09/2020 1113   GLUCOSEU Negative 12/22/2020 1405   HGBUR SMALL (A) 03/09/2020 1113   BILIRUBINUR Negative 12/22/2020 1405   KETONESUR NEGATIVE 03/09/2020 1113   PROTEINUR Negative 12/22/2020 1405   PROTEINUR 30 (A) 03/09/2020 1113   UROBILINOGEN 0.2 07/14/2020 1654   UROBILINOGEN 0.2 01/25/2007 1533   NITRITE Positive (A) 12/22/2020 1405   NITRITE NEGATIVE 03/09/2020 1113   LEUKOCYTESUR 1+ (A) 12/22/2020 1405   LEUKOCYTESUR LARGE (A) 03/09/2020 1113    Pertinent  Imaging:   Assessment & Plan: Kidney patient will colonization overtreating mild symptoms.  Educated patient about pathophysiology of recurrent urinary tract infections superimposed on high residuals and catheterizing.  Reassess in 6 weeks on daily Keflex 250 mg.  She gets a rash from sulfa.  Trimethoprim potentially an option.  Reeducation regarding history of discussed.  Probiotics discussed.  [place] discussed.  Reassess 6-week  I do not think there is any role for urodynamics or sacral nerve stimulation at this stage  There are no diagnoses linked to this encounter.  No follow-ups on file.  Reece Packer, MD  [facility] [address], [place], [place] [address] [phone]

## 2021-01-11 ENCOUNTER — Ambulatory Visit (INDEPENDENT_AMBULATORY_CARE_PROVIDER_SITE_OTHER): Payer: Medicare HMO

## 2021-01-11 DIAGNOSIS — R072 Precordial pain: Secondary | ICD-10-CM

## 2021-01-11 LAB — ECHOCARDIOGRAM COMPLETE
AR max vel: 4.01 cm2
AV Area VTI: 3.93 cm2
AV Area mean vel: 3.93 cm2
AV Mean grad: 2 mmHg
AV Peak grad: 4.7 mmHg
Ao pk vel: 1.08 m/s
Area-P 1/2: 2.82 cm2
Calc EF: 52.1 %
S' Lateral: 3.1 cm
Single Plane A2C EF: 53.5 %
Single Plane A4C EF: 48.8 %

## 2021-01-14 ENCOUNTER — Telehealth: Payer: Self-pay

## 2021-01-14 LAB — CULTURE, URINE COMPREHENSIVE

## 2021-01-14 NOTE — Telephone Encounter (Signed)
  Care Management   Follow Up Note   01/14/2021 Name: Jean Gonzales MRN: [id number] DOB: September 19, 1955   Primary Care Provider: [name], FNP Reason for referral : Chronic Care Management   An unsuccessful telephone outreach was attempted today. The patient was referred to the case management team for assistance with care management and care coordination.    Follow Up Plan:  A HIPAA compliant voice message was left today requesting a return call.   [name] Care Management [facility] [phone]

## 2021-01-17 ENCOUNTER — Other Ambulatory Visit: Payer: Self-pay

## 2021-01-17 ENCOUNTER — Encounter: Payer: Self-pay | Admitting: Cardiology

## 2021-01-17 ENCOUNTER — Ambulatory Visit (INDEPENDENT_AMBULATORY_CARE_PROVIDER_SITE_OTHER): Payer: Medicare HMO | Admitting: Cardiology

## 2021-01-17 VITALS — BP 152/96 | HR 87 | Ht 67.5 in | Wt 219.0 lb

## 2021-01-17 DIAGNOSIS — R072 Precordial pain: Secondary | ICD-10-CM

## 2021-01-17 DIAGNOSIS — I10 Essential (primary) hypertension: Secondary | ICD-10-CM | POA: Diagnosis not present

## 2021-01-17 DIAGNOSIS — E782 Mixed hyperlipidemia: Secondary | ICD-10-CM

## 2021-01-17 MED ORDER — HYDROCHLOROTHIAZIDE 12.5 MG PO CAPS: 12.5000 mg | ORAL_CAPSULE | Freq: Every day | ORAL | 1 refills | Status: DC

## 2021-01-17 NOTE — Progress Notes (Signed)
Cardiology Office Note:    Date:  01/17/2021   ID:  Jean Gonzales, DOB [birth year], MRN [id number]  PCP:  [name], FNP   [facility] Providers Cardiologist:  Kate Sable, MD     Referring MD: Virginia Crews, MD   Chief Complaint  Patient presents with   Other    Follow up post testing. Patient c.o swelling in ankles. Meds reviewed verbally with patient.     History of Present Illness:    Phillippa Gonzales is a 65 y.o. female with a hx of hyperlipidemia, GERD who presents for follow-up.  She was last seen due to chest pain.  Patient states having worsening chest pain over the past year.  Echocardiogram and Lexiscan Myoview were ordered to evaluate cardiac function.  Fenofibrate due to elevated triglycerides.  She feels well, blood pressures are usually in the 130s, does not take HCTZ as prescribed, blood pressure is usually very low when she takes it.  Has occasional swelling in her ankles.  Still has occasional nonspecific chest discomfort, not associated with exertion.    Past Medical History:  Diagnosis Date   Ankylosing spondylitis ([place])    Arthritis    osteoarthritis right knee   Cancer ([place]) 2014   endometrial CA   Diabetes mellitus without complication ([facility])    diet controlled   Endometrial cancer ([place])    GERD (gastroesophageal reflux disease)    Hyperlipidemia    Hypertension    improved after ca tx    Past Surgical History:  Procedure Laterality Date   ABDOMINAL HYSTERECTOMY  2015   cancer   BREAST BIOPSY Right 2008ish   core   CATARACT EXTRACTION W/PHACO Right 02/05/2018   Procedure: CATARACT EXTRACTION PHACO AND INTRAOCULAR LENS PLACEMENT ([place]) RIGHT DIABETES IVA TOPICAL;  Surgeon: Eulogio Bear, MD;  Location: [facility];  Service: Ophthalmology;  Laterality: Right;   CATARACT EXTRACTION W/PHACO Left 02/25/2018   Procedure: CATARACT EXTRACTION PHACO AND INTRAOCULAR LENS PLACEMENT ([place]) LEFT DIABETIC;  Surgeon:  Eulogio Bear, MD;  Location: [facility];  Service: Ophthalmology;  Laterality: Left;   CHOLECYSTECTOMY  1992   COLONOSCOPY WITH PROPOFOL N/A 08/08/2019   Procedure: COLONOSCOPY WITH PROPOFOL;  Surgeon: Jonathon Bellows, MD;  [facility];  Service: Gastroenterology;  Laterality: N/A;   EYE SURGERY     TONSILLECTOMY  1973   TONSILLECTOMY     TOTAL KNEE ARTHROPLASTY Right 07/09/2017   Procedure: TOTAL KNEE ARTHROPLASTY;  Surgeon: Lovell Sheehan, MD;  Location: [facility]S;  Service: Orthopedics;  Laterality: Right;    Current Medications: Current Meds  Medication Sig   acetaminophen (TYLENOL) 325 MG tablet Take 1-2 tablets (325-650 mg total) by mouth every 6 (six) hours as needed for mild pain (pain score 1-3 or temp > 100.5).   atorvastatin (LIPITOR) 40 MG tablet TAKE 1 TABLET BY MOUTH EVERY DAY IN THE MORNING   Blood Glucose Monitoring Suppl (ONE TOUCH ULTRA 2) w/Device KIT Use as directed   celecoxib (CELEBREX) 100 MG capsule Take 1 capsule (100 mg total) by mouth 2 (two) times daily.   cephALEXin (KEFLEX) 250 MG capsule Take 1 capsule (250 mg total) by mouth daily.   diclofenac sodium (VOLTAREN) 1 % GEL Apply 2 g topically 4 (four) times daily. Apply to knee   esomeprazole (NEXIUM) 40 MG capsule TAKE 1 CAPSULE BY MOUTH EVERY DAY   fenofibrate (TRICOR) 145 MG tablet Take 1 tablet (145 mg total) by mouth daily.  fluconazole (DIFLUCAN) 150 MG tablet Take 150 mg by mouth once.   furosemide (LASIX) 20 MG tablet TAKE 1 TABLET BY MOUTH TWICE A DAY   glucose blood (ONETOUCH VERIO) test strip Use as instructed   hydrochlorothiazide (MICROZIDE) 12.5 MG capsule Take 1 capsule (12.5 mg total) by mouth daily.   nystatin (MYCOSTATIN/NYSTOP) powder Apply topically 3 (three) times daily.   ondansetron (ZOFRAN ODT) 4 MG disintegrating tablet Take 1 tablet (4 mg total) by mouth every 8 (eight) hours as needed for nausea or vomiting.   OneTouch Delica Lancets 06Y MISC Use as directed    tamsulosin (FLOMAX) 0.4 MG CAPS capsule Take 1 capsule (0.4 mg total) by mouth daily.   triamcinolone ointment (KENALOG) 0.1 % APPLY TO AFFECTED AREA TWICE A DAY   Turmeric, Curcuma Longa, (CURCUMIN) POWD by Does not apply route. With ginger and black seed   zolpidem (AMBIEN) 10 MG tablet TAKE 1 TABLET BY MOUTH AT BEDTIME AS NEEDED FOR SLEEP.   [DISCONTINUED] hydrochlorothiazide (HYDRODIURIL) 25 MG tablet Take 25 mg by mouth daily.     Allergies:   Iodinated diagnostic agents, Metformin and related, and Bactrim [sulfamethoxazole-trimethoprim]   Social History   Socioeconomic History   Marital status: Divorced    Spouse name: Not on file   Number of children: Not on file   Years of education: Not on file   Highest education level: Not on file  Occupational History   Not on file  Tobacco Use   Smoking status: Never   Smokeless tobacco: Never  Vaping Use   Vaping Use: Never used  Substance and Sexual Activity   Alcohol use: No    Alcohol/week: 0.0 standard drinks   Drug use: No   Sexual activity: Not Currently  Other Topics Concern   Not on file  Social History Narrative   Not on file   Social Determinants of Health   Financial Resource Strain: Not on file  Food Insecurity: No Food Insecurity   Worried About Running Out of Food in the Last Year: Never true   [place] in the Last Year: Never true  Transportation Needs: No Transportation Needs   Lack of Transportation (Medical): No   Lack of Transportation (Non-Medical): No  Physical Activity: Not on file  Stress: Not on file  Social Connections: Not on file     Family History: The patient's family history includes Cancer in her mother; Diabetes in her mother; Heart disease in her father. There is no history of Breast cancer, Kidney cancer, or Bladder Cancer.  ROS:   Please see the history of present illness.     All other systems reviewed and are negative.  EKGs/Labs/Other Studies Reviewed:    The following  studies were reviewed today:   EKG:  EKG is  ordered today.  The ekg ordered today demonstrates normal sinus rhythm, cannot rule out anterolateral infarct  Recent Labs: 03/09/2020: Hemoglobin 13.6; Platelets 154 08/12/2020: ALT 21; BUN 9; Creatinine, Ser 0.90; Potassium 3.8; Sodium 138  Recent Lipid Panel    Component Value Date/Time   CHOL 155 08/12/2020 1033   TRIG 238 (H) 08/12/2020 1033   HDL 33 (L) 08/12/2020 1033   CHOLHDL 4.7 (H) 08/12/2020 1033   LDLCALC 82 08/12/2020 1033     Risk Assessment/Calculations:          Physical Exam:    VS:  BP (!) 152/96 (BP Location: Left Arm, Patient Position: Sitting, Cuff Size: Normal)   Pulse 87  Ht 5' 7.5" (1.715 m)   Wt 219 lb (99.3 kg)   SpO2 98%   BMI 33.79 kg/m     Wt Readings from Last 3 Encounters:  01/17/21 219 lb (99.3 kg)  01/10/21 222 lb (100.7 kg)  12/22/20 222 lb (100.7 kg)     GEN:  Well nourished, well developed in no acute distress HEENT: Normal NECK: No JVD; No carotid bruits LYMPHATICS: No lymphadenopathy CARDIAC: RRR, no murmurs, rubs, gallops RESPIRATORY:  Clear to auscultation without rales, wheezing or rhonchi  ABDOMEN: Soft, non-tender, non-distended MUSCULOSKELETAL:  No edema; No deformity  SKIN: Warm and dry NEUROLOGIC:  Alert and oriented x 3 PSYCHIATRIC:  Normal affect   ASSESSMENT:    1. Precordial pain   2. Mixed hyperlipidemia   3. Primary hypertension     PLAN:    In order of problems listed above:  Chest pain, risk factors hyperlipidemia, family history of CAD.  Echo shows normal EF 60%, impaired relaxation. Leane Call 09/2020 showed no significant ischemia. Mixed hyperlipidemia, continue Lipitor,  fenofibrate.  Obtain fasting lipid profile. Hypertension, advised to take HCTZ 12.5 mg daily.  Follow-up in 3 months.     Medication Adjustments/Labs and Tests Ordered: Current medicines are reviewed at length with the patient today.  Concerns regarding medicines are  outlined above.  Orders Placed This Encounter  Procedures   Lipid panel     Meds ordered this encounter  Medications   hydrochlorothiazide (MICROZIDE) 12.5 MG capsule    Sig: Take 1 capsule (12.5 mg total) by mouth daily.    Dispense:  90 capsule    Refill:  1      Patient Instructions  Medication Instructions:   Your physician has recommended you make the following change in your medication:   START taking Hydrochlorothiazide 12.5 MG once a day.   *If you need a refill on your cardiac medications before your next appointment, please call your pharmacy*   Lab Work:  Fasting Lipid drawn in office today.  If you have labs (blood work) drawn today and your tests are completely normal, you will receive your results only by: [place] (if you have MyChart) OR A paper copy in the mail If you have any lab test that is abnormal or we need to change your treatment, we will call you to review the results.   Testing/Procedures: None ordered   Follow-Up: At [facility], you and your health needs are our priority.  As part of our continuing mission to provide you with exceptional heart care, we have created designated Provider Care Teams.  These Care Teams include your primary Cardiologist (physician) and Advanced Practice Providers (APPs -  Physician Assistants and Nurse Practitioners) who all work together to provide you with the care you need, when you need it.  We recommend signing up for the patient portal called "MyChart".  Sign up information is provided on this After Visit Summary.  MyChart is used to connect with patients for Virtual Visits (Telemedicine).  Patients are able to view lab/test results, encounter notes, upcoming appointments, etc.  Non-urgent messages can be sent to your provider as well.   To learn more about what you can do with MyChart, go to [email or web address].    Your next appointment:   3 month(s)  The format for your next appointment:    In Person  Provider:   You may see Kate Sable, MD or one of the following Advanced Practice Providers on your designated Care Team:  Murray Hodgkins, NP Christell Faith, PA-C Marrianne Mood, PA-C Cadence Kathlen Mody, [place]   Other Instructions    Signed, Kate Sable, MD  01/17/2021 12:28 PM    [facility]

## 2021-01-17 NOTE — Patient Instructions (Signed)
Medication Instructions:   Your physician has recommended you make the following change in your medication:   START taking Hydrochlorothiazide 12.5 MG once a day.   *If you need a refill on your cardiac medications before your next appointment, please call your pharmacy*   Lab Work:  Fasting Lipid drawn in office today.  If you have labs (blood work) drawn today and your tests are completely normal, you will receive your results only by: [place] (if you have MyChart) OR A paper copy in the mail If you have any lab test that is abnormal or we need to change your treatment, we will call you to review the results.   Testing/Procedures: None ordered   Follow-Up: At [facility], you and your health needs are our priority.  As part of our continuing mission to provide you with exceptional heart care, we have created designated Provider Care Teams.  These Care Teams include your primary Cardiologist (physician) and Advanced Practice Providers (APPs -  Physician Assistants and Nurse Practitioners) who all work together to provide you with the care you need, when you need it.  We recommend signing up for the patient portal called "MyChart".  Sign up information is provided on this After Visit Summary.  MyChart is used to connect with patients for Virtual Visits (Telemedicine).  Patients are able to view lab/test results, encounter notes, upcoming appointments, etc.  Non-urgent messages can be sent to your provider as well.   To learn more about what you can do with MyChart, go to [email or web address].    Your next appointment:   3 month(s)  The format for your next appointment:   In Person  Provider:   You may see Kate Sable, MD or one of the following Advanced Practice Providers on your designated Care Team:   Murray Hodgkins, NP Christell Faith, PA-C Marrianne Mood, PA-C Cadence Kathlen Mody, [place]   Other Instructions

## 2021-01-18 LAB — LIPID PANEL
Chol/HDL Ratio: 6.4 ratio — ABNORMAL HIGH (ref 0.0–4.4)
Cholesterol, Total: 223 mg/dL — ABNORMAL HIGH (ref 100–199)
HDL: 35 mg/dL — ABNORMAL LOW (ref >39)
LDL Chol Calc (NIH): 135 mg/dL — ABNORMAL HIGH (ref 0–99)
Triglycerides: 295 mg/dL — ABNORMAL HIGH (ref 0–149)
VLDL Cholesterol Cal: 53 mg/dL — ABNORMAL HIGH (ref 5–40)

## 2021-01-27 ENCOUNTER — Other Ambulatory Visit: Payer: Self-pay | Admitting: Family Medicine

## 2021-01-27 ENCOUNTER — Encounter: Payer: Self-pay | Admitting: Family Medicine

## 2021-01-27 ENCOUNTER — Telehealth: Payer: Self-pay

## 2021-01-27 DIAGNOSIS — N644 Mastodynia: Secondary | ICD-10-CM

## 2021-01-27 DIAGNOSIS — N9989 Other postprocedural complications and disorders of genitourinary system: Secondary | ICD-10-CM | POA: Diagnosis not present

## 2021-01-27 DIAGNOSIS — Z1231 Encounter for screening mammogram for malignant neoplasm of breast: Secondary | ICD-10-CM

## 2021-01-27 DIAGNOSIS — E7800 Pure hypercholesterolemia, unspecified: Secondary | ICD-10-CM

## 2021-01-27 DIAGNOSIS — R32 Unspecified urinary incontinence: Secondary | ICD-10-CM | POA: Diagnosis not present

## 2021-01-27 MED ORDER — ATORVASTATIN CALCIUM 80 MG PO TABS
80.0000 mg | ORAL_TABLET | Freq: Every day | ORAL | 1 refills | Status: DC
Start: 2021-01-27 — End: 2021-05-18

## 2021-01-27 NOTE — Telephone Encounter (Signed)
Called patient and Left a detailed VM per DPR on file. Sent in new prescription for the Lipitor 80 MG once a day. Also informed patient that someone would reach out to her to schedule another fasting lipid profile to be scheduled in 6-8 weeks from now. Order has been entered. Will route to scheduling.  Encouraged patient to call back if she had any further questions or concerns.

## 2021-01-27 NOTE — Telephone Encounter (Signed)
-----   Message from Kate Sable, MD sent at 01/21/2021  9:59 AM EDT ----- Cholesterol abnormal/elevated, increase Lipitor to 80 mg daily, continue fenofibrate.  Low-cholesterol diet advised.  Repeat fasting lipid profile in about 6 to 8 weeks ,prior to follow-up visit.

## 2021-01-31 ENCOUNTER — Other Ambulatory Visit: Payer: Self-pay | Admitting: Urology

## 2021-02-11 ENCOUNTER — Other Ambulatory Visit: Payer: Self-pay

## 2021-02-11 ENCOUNTER — Ambulatory Visit
Admission: RE | Admit: 2021-02-11 | Discharge: 2021-02-11 | Disposition: A | Discharge status: Home / Self Care | Payer: Medicare HMO | Source: Ambulatory Visit | Attending: Family Medicine | Admitting: Family Medicine

## 2021-02-11 DIAGNOSIS — R922 Inconclusive mammogram: Secondary | ICD-10-CM | POA: Diagnosis not present

## 2021-02-11 DIAGNOSIS — N644 Mastodynia: Secondary | ICD-10-CM | POA: Diagnosis not present

## 2021-02-11 NOTE — Progress Notes (Signed)
Normal, negative mammogram.  Repeat in 1 year.  Also, we got a letter from your insurance that you may not be taking your cholesterol medication and I wanted to follow up to make sure you understood the importance of this medication in terms of stroke and heart attack prevention.  Current stroke risk is >25%.  The 10-year ASCVD risk score (Arnett DK, et al., 2019) is: 27.6%   Values used to calculate the score:     Age: 65 years     Sex: Female     Is Non-Hispanic African American: No     Diabetic: Yes     Tobacco smoker: No     Systolic Blood Pressure: 161 mmHg     Is BP treated: Yes     HDL Cholesterol: 35 mg/dL     Total Cholesterol: 223 mg/dL

## 2021-02-16 ENCOUNTER — Ambulatory Visit (INDEPENDENT_AMBULATORY_CARE_PROVIDER_SITE_OTHER): Payer: Medicare HMO | Admitting: Family Medicine

## 2021-02-16 ENCOUNTER — Other Ambulatory Visit: Payer: Self-pay

## 2021-02-16 ENCOUNTER — Encounter: Payer: Self-pay | Admitting: Family Medicine

## 2021-02-16 VITALS — BP 126/70 | HR 81 | Resp 16 | Wt 216.8 lb

## 2021-02-16 DIAGNOSIS — I152 Hypertension secondary to endocrine disorders: Secondary | ICD-10-CM

## 2021-02-16 DIAGNOSIS — E1159 Type 2 diabetes mellitus with other circulatory complications: Secondary | ICD-10-CM

## 2021-02-16 DIAGNOSIS — E785 Hyperlipidemia, unspecified: Secondary | ICD-10-CM | POA: Diagnosis not present

## 2021-02-16 DIAGNOSIS — E1169 Type 2 diabetes mellitus with other specified complication: Secondary | ICD-10-CM

## 2021-02-16 DIAGNOSIS — Z23 Encounter for immunization: Secondary | ICD-10-CM | POA: Diagnosis not present

## 2021-02-16 DIAGNOSIS — L0100 Impetigo, unspecified: Secondary | ICD-10-CM | POA: Diagnosis not present

## 2021-02-16 DIAGNOSIS — E1142 Type 2 diabetes mellitus with diabetic polyneuropathy: Secondary | ICD-10-CM

## 2021-02-16 LAB — POCT GLYCOSYLATED HEMOGLOBIN (HGB A1C): Hemoglobin A1C: 6.5 % — AB (ref 4.0–5.6)

## 2021-02-16 MED ORDER — NYSTATIN 100000 UNIT/GM EX POWD: Freq: Three times a day (TID) | CUTANEOUS | 5 refills | Status: DC

## 2021-02-16 MED ORDER — OZEMPIC (1 MG/DOSE) 4 MG/3ML ~~LOC~~ SOPN: 1.0000 mg | PEN_INJECTOR | SUBCUTANEOUS | 1 refills | Status: DC

## 2021-02-16 MED ORDER — ALCOHOL SWABS 70 % PADS: 100.0000 | MEDICATED_PAD | Freq: Every day | 1 refills | Status: DC

## 2021-02-16 NOTE — Progress Notes (Signed)
Established patient visit   Patient: Jean Gonzales   DOB: 04-14-1955   65 y.o. Female  MRN: [id number] Visit Date: 02/16/2021  Today's healthcare provider: [name], FNP   Chief Complaint  Patient presents with   Diabetes   Hypertension   Hyperlipidemia   Subjective    HPI  Diabetes Mellitus Type II, follow-up  Lab Results  Component Value Date   HGBA1C 6.5 (A) 02/16/2021   HGBA1C 8.7 (A) 11/11/2020   HGBA1C 8.4 (A) 08/12/2020   Last seen for diabetes 3 months ago.  Management since then includes start Ozempic and replace glucometer She reports excellent compliance with treatment. She is not having side effects.   Home blood sugar records:  150-180  Episodes of hypoglycemia? No    Current insulin regiment: none Most Recent Eye Exam: 12/20/20  --------------------------------------------------------------------------------------------------- Hypertension, follow-up  BP Readings from Last 3 Encounters:  02/16/21 126/70  01/17/21 (!) 152/96  01/10/21 134/82   Wt Readings from Last 3 Encounters:  02/16/21 216 lb 12.8 oz (98.3 kg)  01/17/21 219 lb (99.3 kg)  01/10/21 222 lb (100.7 kg)     She was last seen for hypertension 3 months ago.  BP at that visit was 107/73. Management since that visit includes none. She is not exercising. She is adherent to low salt diet.   Outside blood pressures are systolic 174-715 and diastolic 95-39Y.  She does not smoke.  Use of agents associated with hypertension: none.   --------------------------------------------------------------------------------------------------- Lipid/Cholesterol, follow-up  Last Lipid Panel: Lab Results  Component Value Date   CHOL 223 (H) 01/17/2021   LDLCALC 135 (H) 01/17/2021   HDL 35 (L) 01/17/2021   TRIG 295 (H) 01/17/2021    She was last seen for this 3 months ago.  Management since that visit includes patient sates since last visit her cardiologist has increased   Atorvastatin to 1m.  She reports good compliance with treatment. She is not having side effects.   Symptoms: No appetite changes No foot ulcerations  No chest pain No chest pressure/discomfort  No dyspnea No orthopnea  No fatigue No lower extremity edema  No palpitations No paroxysmal nocturnal dyspnea  No nausea Yes numbness or tingling of extremity  No polydipsia No polyuria  No speech difficulty No syncope   She is following a Regular diet. Current exercise: none  Last metabolic panel Lab Results  Component Value Date   GLUCOSE 162 (H) 08/12/2020   NA 138 08/12/2020   K 3.8 08/12/2020   BUN 9 08/12/2020   CREATININE 0.90 08/12/2020   EGFR 71 08/12/2020   GFRNONAA 57 (L) 03/09/2020   CALCIUM 9.0 08/12/2020   AST 21 08/12/2020   ALT 21 08/12/2020   The 10-year ASCVD risk score (Arnett DK, et al., 2019) is: 19.9%  ---------------------------------------------------------------------------------------------------   Medications: Outpatient Medications Prior to Visit  Medication Sig   acetaminophen (TYLENOL) 325 MG tablet Take 1-2 tablets (325-650 mg total) by mouth every 6 (six) hours as needed for mild pain (pain score 1-3 or temp > 100.5).   atorvastatin (LIPITOR) 80 MG tablet Take 1 tablet (80 mg total) by mouth daily. TAKE 1 TABLET BY MOUTH EVERY DAY IN THE MORNING   Blood Glucose Monitoring Suppl (ONE TOUCH ULTRA 2) w/Device KIT Use as directed   celecoxib (CELEBREX) 100 MG capsule Take 1 capsule (100 mg total) by mouth 2 (two) times daily.   cephALEXin (KEFLEX) 250 MG capsule Take 1 capsule (250 mg total)  by mouth daily.   diclofenac sodium (VOLTAREN) 1 % GEL Apply 2 g topically 4 (four) times daily. Apply to knee   esomeprazole (NEXIUM) 40 MG capsule TAKE 1 CAPSULE BY MOUTH EVERY DAY   fenofibrate (TRICOR) 145 MG tablet Take 1 tablet (145 mg total) by mouth daily.   fluconazole (DIFLUCAN) 150 MG tablet Take 150 mg by mouth once.   furosemide (LASIX) 20 MG tablet  TAKE 1 TABLET BY MOUTH TWICE A DAY   glucose blood (ONETOUCH VERIO) test strip Use as instructed   hydrochlorothiazide (MICROZIDE) 12.5 MG capsule Take 1 capsule (12.5 mg total) by mouth daily.   ondansetron (ZOFRAN ODT) 4 MG disintegrating tablet Take 1 tablet (4 mg total) by mouth every 8 (eight) hours as needed for nausea or vomiting.   OneTouch Delica Lancets 78E MISC Use as directed   tamsulosin (FLOMAX) 0.4 MG CAPS capsule Take 1 capsule (0.4 mg total) by mouth daily.   triamcinolone ointment (KENALOG) 0.1 % APPLY TO AFFECTED AREA TWICE A DAY   Turmeric, Curcuma Longa, (CURCUMIN) POWD by Does not apply route. With ginger and black seed   zolpidem (AMBIEN) 10 MG tablet TAKE 1 TABLET BY MOUTH AT BEDTIME AS NEEDED FOR SLEEP.   [DISCONTINUED] diclofenac Sodium (VOLTAREN) 1 % GEL Apply topically.   [DISCONTINUED] nystatin (MYCOSTATIN/NYSTOP) powder Apply topically 3 (three) times daily.   [DISCONTINUED] OZEMPIC, 0.25 OR 0.5 MG/DOSE, 2 MG/1.5ML SOPN Inject into the skin.   nitroGLYCERIN (NITROSTAT) 0.4 MG SL tablet Place 1 tablet (0.4 mg total) under the tongue every 5 (five) minutes as needed for chest pain. For a maximum of 3 doses in one day.   No facility-administered medications prior to visit.    Review of Systems     Objective    BP 126/70   Pulse 81   Resp 16   Wt 216 lb 12.8 oz (98.3 kg)   SpO2 100%   BMI 33.45 kg/m    Physical Exam Vitals and nursing note reviewed.  Constitutional:      General: She is not in acute distress.    Appearance: Normal appearance. She is obese. She is not ill-appearing, toxic-appearing or diaphoretic.  HENT:     Head: Normocephalic and atraumatic.  Cardiovascular:     Rate and Rhythm: Normal rate and regular rhythm.     Pulses: Normal pulses.     Heart sounds: Normal heart sounds. No murmur heard.   No friction rub. No gallop.  Pulmonary:     Effort: Pulmonary effort is normal. No respiratory distress.     Breath sounds: Normal breath  sounds. No stridor. No wheezing, rhonchi or rales.  Chest:     Chest wall: No tenderness.  Abdominal:     General: Bowel sounds are normal.     Palpations: Abdomen is soft.  Musculoskeletal:        General: No swelling, tenderness, deformity or signs of injury. Normal range of motion.     Right lower leg: No edema.     Left lower leg: No edema.  Skin:    General: Skin is warm and dry.     Capillary Refill: Capillary refill takes less than 2 seconds.     Coloration: Skin is not jaundiced or pale.     Findings: No bruising, erythema, lesion or rash.  Neurological:     General: No focal deficit present.     Mental Status: She is alert and oriented to person, place, and time. Mental status is  at baseline.     Cranial Nerves: No cranial nerve deficit.     Sensory: No sensory deficit.     Motor: No weakness.     Coordination: Coordination normal.  Psychiatric:        Mood and Affect: Mood normal.        Behavior: Behavior normal.        Thought Content: Thought content normal.        Judgment: Judgment normal.      Results for orders placed or performed in visit on 02/16/21  POCT glycosylated hemoglobin (Hb A1C)  Result Value Ref Range   Hemoglobin A1C 6.5 (A) 4.0 - 5.6 %   HbA1c POC (<> result, manual entry)     HbA1c, POC (prediabetic range)     HbA1c, POC (controlled diabetic range)      Assessment & Plan     Problem List Items Addressed This Visit       Cardiovascular and Mediastinum   Hypertension associated with diabetes ([place])    Chronic, well controlled A1c has improved Continue current regimen with increase in ozempic       Relevant Medications   Semaglutide, 1 MG/DOSE, (OZEMPIC, 1 MG/DOSE,) 4 MG/3ML SOPN     Endocrine   Diabetes mellitus ([place]) - Primary    Request for alcohol pads to go with glucose meter      Relevant Medications   Semaglutide, 1 MG/DOSE, (OZEMPIC, 1 MG/DOSE,) 4 MG/3ML SOPN   Alcohol Swabs 70 % PADS   Other Relevant Orders   POCT  glycosylated hemoglobin (Hb A1C) (Completed)   Hyperlipidemia associated with type 2 diabetes mellitus (HCC)    Taking 46m of lipitor; having to break fibrate into 3s to swallow Has been taking for 3 weeks at this time      Relevant Medications   Semaglutide, 1 MG/DOSE, (OZEMPIC, 1 MG/DOSE,) 4 MG/3ML SOPN   Diabetic polyneuropathy associated with type 2 diabetes mellitus ([place]    Improving per pt report Happy with a1c since starting ozempic      Relevant Medications   Semaglutide, 1 MG/DOSE, (OZEMPIC, 1 MG/DOSE,) 4 MG/3ML SOPN     Musculoskeletal and Integument   Impetigo    Pulled for refill request      Relevant Medications   nystatin (MYCOSTATIN/NYSTOP) powder     Other   Flu vaccine need    Consent reviewed; provided      Relevant Orders   Flu Vaccine QUAD High Dose(Fluad)   Other Visit Diagnoses     Need for immunization against influenza            Return in about 3 months (around 05/19/2021) for T2DM management.      IVonna Kotyk FNP, have reviewed all documentation for this visit. The documentation on 02/16/21 for the exam, diagnosis, procedures, and orders are all accurate and complete.    EGwyneth Sprout [phone](phone) [phone](fax)  [facility]

## 2021-02-16 NOTE — Assessment & Plan Note (Signed)
Pulled for refill request

## 2021-02-16 NOTE — Assessment & Plan Note (Signed)
Taking 80mg  of lipitor; having to break fibrate into 3s to swallow Has been taking for 3 weeks at this time

## 2021-02-16 NOTE — Assessment & Plan Note (Signed)
Improving per pt report Happy with a1c since starting ozempic

## 2021-02-16 NOTE — Assessment & Plan Note (Signed)
Consent reviewed; provided

## 2021-02-16 NOTE — Assessment & Plan Note (Signed)
Chronic, well controlled A1c has improved Continue current regimen with increase in ozempic

## 2021-02-16 NOTE — Assessment & Plan Note (Signed)
Request for alcohol pads to go with glucose meter

## 2021-02-21 ENCOUNTER — Ambulatory Visit (INDEPENDENT_AMBULATORY_CARE_PROVIDER_SITE_OTHER): Payer: Medicare HMO | Admitting: Urology

## 2021-02-21 ENCOUNTER — Other Ambulatory Visit: Payer: Self-pay

## 2021-02-21 ENCOUNTER — Encounter: Payer: Self-pay | Admitting: Urology

## 2021-02-21 VITALS — BP 166/105 | HR 71 | Ht 67.75 in | Wt 216.0 lb

## 2021-02-21 DIAGNOSIS — N390 Urinary tract infection, site not specified: Secondary | ICD-10-CM

## 2021-02-21 LAB — URINALYSIS, COMPLETE
Bilirubin, UA: NEGATIVE
Glucose, UA: NEGATIVE
Ketones, UA: NEGATIVE
Nitrite, UA: NEGATIVE
Protein,UA: NEGATIVE
Specific Gravity, UA: 1.015 (ref 1.005–1.030)
Urobilinogen, Ur: 0.2 mg/dL (ref 0.2–1.0)
pH, UA: 6 (ref 5.0–7.5)

## 2021-02-21 LAB — MICROSCOPIC EXAMINATION: WBC, UA: >30 /hpf — AB (ref 0–5)

## 2021-02-21 MED ORDER — CIPROFLOXACIN HCL 250 MG PO TABS: 250.0000 mg | ORAL_TABLET | Freq: Two times a day (BID) | ORAL | 0 refills | Status: DC

## 2021-02-21 MED ORDER — CEPHALEXIN 250 MG PO CAPS
250.0000 mg | ORAL_CAPSULE | Freq: Every day | ORAL | 11 refills | Status: DC
Start: 2021-02-21 — End: 2021-05-02

## 2021-02-21 NOTE — Progress Notes (Signed)
02/21/2021 11:16 AM   Jean Gonzales 12/28/1955 [id number]  Referring provider: [name], [address] [place],  [place] [address]  Chief Complaint  Patient presents with   Follow-up    HPI: [place]: Recurrent urinary tract infections with multiple positive cultures.  Vaginal atrophy with history of endometrial carcinoma.  She may have urethral stenosis from pelvic surgery.  Had normal cystoscopy and was on low-dose Macrodantin.  She catheterizes once a day and gets residuals of 500 to 700 mL.  Has been given Flomax in the past.  Normal renal x-ray December 2021   The patient had a hysterectomy followed by chemotherapy and radiation for endometrial carcinoma about 6 years ago.  It appears last 2 years she was being dilated at a rate such that she started to catheterize even daily.  Now she catheterizes once a day.  She could not get 500 cc to a liter.  She generally voids 3-4 times a day only a few cc.  She gets up once a night.  When she voids her flow was weak.  She hesitates.  She does not strain.  She does not feel empty  She gets recurrent bladder infections with dark cloudy foul-smelling urine that respond favorably antibiotics.  She is in serious infection at least monthly or more often.  Few days later symptoms returned.  No pain or fever   Her urethral stenosis has dated back before 2019 when I reviewed the consult note.  She has rare incontinence and wears a pad for confidence.   Patient was just treated with doxycycline and I was retreating herself.  She is convinced that antibiotics are not working.  It appears she had been on Macrodantin for 3 months but others have not been tried    Kidney patient will colonization overtreating mild symptoms.  Educated patient about pathophysiology of recurrent urinary tract infections superimposed on high residuals and catheterizing.  Reassess in 6 weeks on daily Keflex 250 mg.  She gets a rash from sulfa.  Trimethoprim  potentially an option.  Reeducation regarding history of discussed.  Probiotics discussed.  Cranberry discussed.  Reassess 6-week   I do not think there is any role for urodynamics or sacral nerve stimulation at this stage   Today Patient was on the daily Keflex and still had foul-smelling cloudy urine.  She is hoping to get an antibiotic for 7 to 14 days to cleared up and she is trying an over-the-counter preparation that coats the lining of the bladder.  She still catheterizes once a day.  I called in ciprofloxacin 250 mg twice a day for 10 days.  She understands that we may not be able to prevent her urine from being foul-smelling and cloudy but we have limited tools including daily Keflex.  She has been on Macrodantin daily in the past.     PMH: Past Medical History:  Diagnosis Date   Ankylosing spondylitis ([place])    Arthritis    osteoarthritis right knee   Cancer ([place]) 2014   endometrial CA   Diabetes mellitus without complication (HCC)    diet controlled   Endometrial cancer ([place])    GERD (gastroesophageal reflux disease)    Hyperlipidemia    Hypertension    improved after ca tx    Surgical History: Past Surgical History:  Procedure Laterality Date   ABDOMINAL HYSTERECTOMY  2015   cancer   BREAST BIOPSY Right 2008ish   core   CATARACT EXTRACTION W/PHACO Right 02/05/2018  Procedure: CATARACT EXTRACTION PHACO AND INTRAOCULAR LENS PLACEMENT ([place]) RIGHT DIABETES IVA TOPICAL;  Surgeon: Eulogio Bear, MD;  Location: [facility];  Service: Ophthalmology;  Laterality: Right;   CATARACT EXTRACTION W/PHACO Left 02/25/2018   Procedure: CATARACT EXTRACTION PHACO AND INTRAOCULAR LENS PLACEMENT ([place]) LEFT DIABETIC;  Surgeon: Eulogio Bear, MD;  Location: [facility];  Service: Ophthalmology;  Laterality: Left;   CHOLECYSTECTOMY  1992   COLONOSCOPY WITH PROPOFOL N/A 08/08/2019   Procedure: COLONOSCOPY WITH PROPOFOL;  Surgeon: Jonathon Bellows, MD;  Location: [facility]  ENDOSCOPY;  Service: Gastroenterology;  Laterality: N/A;   EYE SURGERY     TONSILLECTOMY  1973   TONSILLECTOMY     TOTAL KNEE ARTHROPLASTY Right 07/09/2017   Procedure: TOTAL KNEE ARTHROPLASTY;  Surgeon: Lovell Sheehan, MD;  Location: [facility];  Service: Orthopedics;  Laterality: Right;    Home Medications:  Allergies as of 02/21/2021       Reactions   Iodinated Diagnostic Agents Anaphylaxis   Metformin And Related Diarrhea   Bactrim [sulfamethoxazole-trimethoprim] Itching, Rash        Medication List        Accurate as of February 21, 2021 11:16 AM. If you have any questions, ask your nurse or doctor.          acetaminophen 325 MG tablet Commonly known as: TYLENOL Take 1-2 tablets (325-650 mg total) by mouth every 6 (six) hours as needed for mild pain (pain score 1-3 or temp > 100.5).   Alcohol Swabs 70 % Pads 100 each by Does not apply route daily.   atorvastatin 80 MG tablet Commonly known as: LIPITOR Take 1 tablet (80 mg total) by mouth daily. TAKE 1 TABLET BY MOUTH EVERY DAY IN THE MORNING   celecoxib 100 MG capsule Commonly known as: CELEBREX Take 1 capsule (100 mg total) by mouth 2 (two) times daily.   cephALEXin 250 MG capsule Commonly known as: Keflex Take 1 capsule (250 mg total) by mouth daily.   Curcumin Powd by Does not apply route. With ginger and black seed   diclofenac sodium 1 % Gel Commonly known as: VOLTAREN Apply 2 g topically 4 (four) times daily. Apply to knee   esomeprazole 40 MG capsule Commonly known as: NEXIUM TAKE 1 CAPSULE BY MOUTH EVERY DAY   fenofibrate 145 MG tablet Commonly known as: Tricor Take 1 tablet (145 mg total) by mouth daily.   fluconazole 150 MG tablet Commonly known as: DIFLUCAN Take 150 mg by mouth once.   furosemide 20 MG tablet Commonly known as: LASIX TAKE 1 TABLET BY MOUTH TWICE A DAY   hydrochlorothiazide 12.5 MG capsule Commonly known as: Microzide Take 1 capsule (12.5 mg total) by mouth  daily.   nitroGLYCERIN 0.4 MG SL tablet Commonly known as: NITROSTAT Place 1 tablet (0.4 mg total) under the tongue every 5 (five) minutes as needed for chest pain. For a maximum of 3 doses in one day.   nystatin powder Commonly known as: MYCOSTATIN/NYSTOP Apply topically 3 (three) times daily.   ondansetron 4 MG disintegrating tablet Commonly known as: Zofran ODT Take 1 tablet (4 mg total) by mouth every 8 (eight) hours as needed for nausea or vomiting.   ONE TOUCH ULTRA 2 w/Device Kit Use as directed   OneTouch Delica Lancets 16B Misc Use as directed   OneTouch Verio test strip Generic drug: glucose blood Use as instructed   Ozempic (1 MG/DOSE) 4 MG/3ML Sopn Generic drug: Semaglutide (1 MG/DOSE) Inject 1 mg into  the skin once a week.   tamsulosin 0.4 MG Caps capsule Commonly known as: FLOMAX Take 1 capsule (0.4 mg total) by mouth daily.   triamcinolone ointment 0.1 % Commonly known as: KENALOG APPLY TO AFFECTED AREA TWICE A DAY   zolpidem 10 MG tablet Commonly known as: AMBIEN TAKE 1 TABLET BY MOUTH AT BEDTIME AS NEEDED FOR SLEEP.        Allergies:  Allergies  Allergen Reactions   Iodinated Diagnostic Agents Anaphylaxis   Metformin And Related Diarrhea   Bactrim [Sulfamethoxazole-Trimethoprim] Itching and Rash    Family History: Family History  Problem Relation Age of Onset   Diabetes Mother    Cancer Mother    Heart disease Father    Breast cancer Neg Hx    Kidney cancer Neg Hx    Bladder Cancer Neg Hx     Social History:  reports that she has never smoked. She has never used smokeless tobacco. She reports that she does not drink alcohol and does not use drugs.  ROS:                                        Physical Exam: BP (!) 166/105   Pulse 71   Ht 5' 7.75" (1.721 m)   Wt 98 kg   BMI 33.09 kg/m     Laboratory Data: Lab Results  Component Value Date   WBC 16.0 (H) 03/09/2020   HGB 13.6 03/09/2020   HCT 39.3  03/09/2020   MCV 87.1 03/09/2020   PLT 154 03/09/2020    Lab Results  Component Value Date   CREATININE 0.90 08/12/2020    No results found for: PSA  No results found for: TESTOSTERONE  Lab Results  Component Value Date   HGBA1C 6.5 (A) 02/16/2021    Urinalysis    Component Value Date/Time   COLORURINE YELLOW (A) 03/09/2020 1113   APPEARANCEUR Cloudy (A) 01/10/2021 1505   LABSPEC 1.009 03/09/2020 1113   PHURINE 6.0 03/09/2020 1113   GLUCOSEU Negative 01/10/2021 1505   HGBUR SMALL (A) 03/09/2020 1113   BILIRUBINUR Negative 01/10/2021 1505   KETONESUR NEGATIVE 03/09/2020 1113   PROTEINUR Negative 01/10/2021 1505   PROTEINUR 30 (A) 03/09/2020 1113   UROBILINOGEN 0.2 07/14/2020 1654   UROBILINOGEN 0.2 01/25/2007 1533   NITRITE Negative 01/10/2021 1505   NITRITE NEGATIVE 03/09/2020 1113   LEUKOCYTESUR 2+ (A) 01/10/2021 1505   LEUKOCYTESUR LARGE (A) 03/09/2020 1113    Pertinent Imaging:   Assessment & Plan: Call if urine culture differs.  Called in ciprofloxacin for 10 days.  Utilize over-the-counter preparation.  Continue to catheterize once a day.  We both agreed to stay on the daily Keflex.  She will increase catheterization to once in the morning and once at night hoping this helps some.  In the past she is actually done 3 times a day which is probably not necessary.  She understands prophylaxis options are limited.  There are no diagnoses linked to this encounter.  No follow-ups on file.  Reece Packer, MD  [facility] [address], [place], [place] [address] [phone]

## 2021-02-24 LAB — CULTURE, URINE COMPREHENSIVE

## 2021-02-28 ENCOUNTER — Telehealth: Payer: Self-pay | Admitting: Family Medicine

## 2021-02-28 NOTE — Telephone Encounter (Signed)
Copied from [place] [phone]. Topic: Medicare AWV >> Feb 28, 2021 11:58 AM Cher Nakai R wrote: Reason for CRM:  Left message for patient to call back and schedule Medicare Annual Wellness Visit (AWV) in office.   If not able to come in office, please offer to do virtually or by telephone.   Last AWV: 07/03/2019  Please schedule at anytime with [facility] Health Advisor.  If any questions, please contact me at [phone]

## 2021-03-02 DIAGNOSIS — R32 Unspecified urinary incontinence: Secondary | ICD-10-CM | POA: Diagnosis not present

## 2021-03-02 DIAGNOSIS — N9989 Other postprocedural complications and disorders of genitourinary system: Secondary | ICD-10-CM | POA: Diagnosis not present

## 2021-03-02 DIAGNOSIS — C541 Malignant neoplasm of endometrium: Secondary | ICD-10-CM | POA: Diagnosis not present

## 2021-03-03 ENCOUNTER — Ambulatory Visit: Payer: Self-pay

## 2021-03-03 DIAGNOSIS — E1142 Type 2 diabetes mellitus with diabetic polyneuropathy: Secondary | ICD-10-CM

## 2021-03-03 DIAGNOSIS — E1169 Type 2 diabetes mellitus with other specified complication: Secondary | ICD-10-CM

## 2021-03-03 NOTE — Patient Instructions (Signed)
Thank you for allowing the Chronic Care Management team to participate in your care.  

## 2021-03-03 NOTE — Chronic Care Management (AMB) (Signed)
Chronic Care Management   CCM RN Visit Note  03/03/2021 Name: Jean Gonzales MRN: [id number] DOB: June 19, 1955  Subjective: Jean Gonzales is a 65 y.o. year old female who is a primary care patient of Jean Sprout, FNP. The care management team was consulted for assistance with disease management and care coordination needs.    Engaged with patient by telephone for follow up visit in response to provider referral for case management and/or care coordination services.   Consent to Services:  The patient was given information about Chronic Care Management services, agreed to services, and gave verbal consent prior to initiation of services.  Please see initial visit note for detailed documentation.   Patient agreed to services and verbal consent obtained.   Assessment: Review of patient past medical history, allergies, medications, health status, including review of consultants reports, laboratory and other test data, was performed as part of comprehensive evaluation and provision of chronic care management services.   SDOH (Social Determinants of Health) assessments and interventions performed:    CCM Care Plan  Allergies  Allergen Reactions   Iodinated Diagnostic Agents Anaphylaxis   Metformin And Related Diarrhea   Bactrim [Sulfamethoxazole-Trimethoprim] Itching and Rash    Outpatient Encounter Medications as of 03/03/2021  Medication Sig   atorvastatin (LIPITOR) 80 MG tablet Take 1 tablet (80 mg total) by mouth daily. TAKE 1 TABLET BY MOUTH EVERY DAY IN THE MORNING   fenofibrate (TRICOR) 145 MG tablet Take 1 tablet (145 mg total) by mouth daily.   acetaminophen (TYLENOL) 325 MG tablet Take 1-2 tablets (325-650 mg total) by mouth every 6 (six) hours as needed for mild pain (pain score 1-3 or temp > 100.5).   Alcohol Swabs 70 % PADS 100 each by Does not apply route daily.   Blood Glucose Monitoring Suppl (ONE TOUCH ULTRA 2) w/Device KIT Use as directed   celecoxib  (CELEBREX) 100 MG capsule Take 1 capsule (100 mg total) by mouth 2 (two) times daily.   cephALEXin (KEFLEX) 250 MG capsule Take 1 capsule (250 mg total) by mouth daily.   ciprofloxacin (CIPRO) 250 MG tablet Take 1 tablet (250 mg total) by mouth 2 (two) times daily.   diclofenac sodium (VOLTAREN) 1 % GEL Apply 2 g topically 4 (four) times daily. Apply to knee   esomeprazole (NEXIUM) 40 MG capsule TAKE 1 CAPSULE BY MOUTH EVERY DAY   fluconazole (DIFLUCAN) 150 MG tablet Take 150 mg by mouth once.   furosemide (LASIX) 20 MG tablet TAKE 1 TABLET BY MOUTH TWICE A DAY   glucose blood (ONETOUCH VERIO) test strip Use as instructed   hydrochlorothiazide (MICROZIDE) 12.5 MG capsule Take 1 capsule (12.5 mg total) by mouth daily.   nitroGLYCERIN (NITROSTAT) 0.4 MG SL tablet Place 1 tablet (0.4 mg total) under the tongue every 5 (five) minutes as needed for chest pain. For a maximum of 3 doses in one day.   nystatin (MYCOSTATIN/NYSTOP) powder Apply topically 3 (three) times daily.   ondansetron (ZOFRAN ODT) 4 MG disintegrating tablet Take 1 tablet (4 mg total) by mouth every 8 (eight) hours as needed for nausea or vomiting.   OneTouch Delica Lancets 79G MISC Use as directed   Semaglutide, 1 MG/DOSE, (OZEMPIC, 1 MG/DOSE,) 4 MG/3ML SOPN Inject 1 mg into the skin once a week.   tamsulosin (FLOMAX) 0.4 MG CAPS capsule Take 1 capsule (0.4 mg total) by mouth daily.   triamcinolone ointment (KENALOG) 0.1 % APPLY TO AFFECTED AREA TWICE A DAY  Turmeric, Curcuma Longa, (CURCUMIN) POWD by Does not apply route. With ginger and black seed   zolpidem (AMBIEN) 10 MG tablet TAKE 1 TABLET BY MOUTH AT BEDTIME AS NEEDED FOR SLEEP.   No facility-administered encounter medications on file as of 03/03/2021.    Patient Active Problem List   Diagnosis Date Noted   Impetigo 02/16/2021   Flu vaccine need 02/16/2021   Fatigue 11/11/2020   Diabetic polyneuropathy associated with type 2 diabetes mellitus ([place]) 08/12/2020    Ankylosing spondylitis ([place]) 06/28/2020   Bilateral edema of lower extremity 06/28/2020   Morbid obesity ([place]) 10/06/2019   Chest pain 09/29/2017   History of total knee arthroplasty 08/01/2017   Osteoarthritis of right knee 07/09/2017   Osteoarthritis 05/07/2017   Synovitis of knee 12/29/2016   Knee joint effusion 12/29/2016   Acid reflux 02/22/2015   History of migraine headaches 02/22/2015   Hyperlipidemia associated with type 2 diabetes mellitus ([place]) 02/22/2015   Hypertension associated with diabetes ([place]) 02/22/2015   History of colon polyps 02/22/2015   Urinary incontinence 02/22/2015   Psoriatic arthritis ([place]) 02/22/2015   Diabetes mellitus ([place]) 10/06/2014   Urethral meatal stenosis 12/03/2013   Rheumatoid arthritis ([place]) 03/14/2013    Patient Care Plan: RN Care Management Plan of Care     Problem Identified: DM, HLD      Long-Range Goal: Disease Progression Prevented or Minimized   Start Date: 03/03/2021  Expected End Date: 06/01/2021  Priority: High  Note:   Current Barriers:  Chronic Disease Management support and education needs related to HLD and DMII   RNCM Clinical Goal(s):  Patient will demonstrate ongoing adherence to prescribed treatment plan for HLD and DMII through collaboration with the provider,  RN Care manager and care team.   Interventions: 1:1 collaboration with primary care provider regarding development and update of comprehensive plan of care as evidenced by provider attestation and co-signature Inter-disciplinary care team collaboration (see longitudinal plan of care) Evaluation of current treatment plan related to  self management and patient's adherence to plan as established by provider   Diabetes Interventions:  Status: On Track) Reviewed A1c goal: <7%  Lab Results  Component Value Date   HGBA1C 6.5 (A) 02/16/2021  Reviewed medications and importance of compliance. Reports taking as prescribed. Notes significant improvements with  glycemic management since starting Ozempic. Reviewed s/sx of hypoglycemia and hyperglycemia along with recommended interventions. Discussed nutritional intake. Reports nutritional intake has improved. Improved compliance with maintaining a diabetic/modified carb diet. Also notes improvements with managing weight and successful weight loss since starting Ozempic Discussed and offered referrals for available Diabetes education classes. Declined need for additional resources.  Hyperlipidemia Interventions:   Lab Results  Component Value Date   CHOL 223 (H) 01/17/2021   HDL 35 (L) 01/17/2021   LDLCALC 135 (H) 01/17/2021   TRIG 295 (H) 01/17/2021   CHOLHDL 6.4 (H) 01/17/2021    Medications reviewed. Advised to continue taking medications as prescribed and notify provider if unable to tolerate regimen. Reviewed established cholesterol goals  Discussed importance of regular laboratory monitoring as prescribed Discussed strategies to manage statin-induced myalgias Reviewed importance of limiting foods high in cholesterol  Patient Goals/Self-Care Activities: Take all medications as prescribed Attend all scheduled provider appointments Call pharmacy for medication refills 3-7 days in advance of running out of medications Continue performing self care activities and IADL's independently  Call provider office for new concerns or questions    Follow Up Plan:   Will follow up in two months  PLAN A member of the care management team will follow up in two months.   [name] [facility] Care Management [facility] [phone]

## 2021-03-08 ENCOUNTER — Other Ambulatory Visit: Payer: Self-pay | Admitting: Physician Assistant

## 2021-03-08 DIAGNOSIS — K2100 Gastro-esophageal reflux disease with esophagitis, without bleeding: Secondary | ICD-10-CM

## 2021-03-16 ENCOUNTER — Other Ambulatory Visit: Payer: Medicare HMO

## 2021-03-21 ENCOUNTER — Encounter: Payer: Self-pay | Admitting: Family Medicine

## 2021-03-22 ENCOUNTER — Other Ambulatory Visit: Payer: Self-pay | Admitting: Family Medicine

## 2021-03-22 DIAGNOSIS — U071 COVID-19: Secondary | ICD-10-CM

## 2021-03-22 MED ORDER — NIRMATRELVIR/RITONAVIR (PAXLOVID)TABLET: 3.0000 | ORAL_TABLET | Freq: Two times a day (BID) | ORAL | 0 refills | Status: AC

## 2021-03-22 NOTE — Telephone Encounter (Signed)
Will place orders for AV- Thanks EP

## 2021-03-30 ENCOUNTER — Other Ambulatory Visit: Payer: Self-pay

## 2021-03-30 MED ORDER — FENOFIBRATE 145 MG PO TABS: 145.0000 mg | ORAL_TABLET | Freq: Every day | ORAL | 5 refills | Status: DC

## 2021-04-10 ENCOUNTER — Other Ambulatory Visit: Payer: Self-pay | Admitting: Family Medicine

## 2021-04-10 DIAGNOSIS — E1142 Type 2 diabetes mellitus with diabetic polyneuropathy: Secondary | ICD-10-CM

## 2021-04-10 NOTE — Telephone Encounter (Signed)
Requested Prescriptions  Pending Prescriptions Disp Refills   OZEMPIC, 1 MG/DOSE, 4 MG/3ML SOPN [Pharmacy Med Name: OZEMPIC 1 MG/DOSE (4 MG/3 ML)] 3 mL 1    Sig: DIAL AND INJECT UNDER THE SKIN 1 MG WEEKLY     Endocrinology:  Diabetes - GLP-1 Receptor Agonists Passed - 04/10/2021 11:05 AM      Passed - HBA1C is between 0 and 7.9 and within 180 days    Hemoglobin A1C  Date Value Ref Range Status  02/16/2021 6.5 (A) 4.0 - 5.6 % Final   Hgb A1c MFr Bld  Date Value Ref Range Status  07/03/2019 7.1 (H) 4.8 - 5.6 % Final    Comment:             Prediabetes: 5.7 - 6.4          Diabetes: >6.4          Glycemic control for adults with diabetes: <7.0          Passed - Valid encounter within last 6 months    Recent Outpatient Visits          1 month ago Type 2 diabetes mellitus with diabetic polyneuropathy, without long-term current use of insulin ([place])   [facility] Tally Joe T, FNP   5 months ago Type 2 diabetes mellitus with diabetic polyneuropathy, without long-term current use of insulin ([place])   [facility] [place], Dionne Bucy, MD   8 months ago Type 2 diabetes mellitus with diabetic polyneuropathy, without long-term current use of insulin [facility])   [facility] [place], Dionne Bucy, MD   9 months ago Acute cystitis without hematuria   [facility] [place], Laurita Quint, FNP   9 months ago Acute cystitis without hematuria   [facility], Dionne Bucy, MD      Future Appointments            In 1 week Agbor-Etang, Aaron Edelman, MD [id number]   In 3 weeks MacDiarmid, Nicki Reaper, MD [place]   In 1 month Rollene Rotunda, Jaci Standard, [place], [facility]

## 2021-04-15 DIAGNOSIS — C541 Malignant neoplasm of endometrium: Secondary | ICD-10-CM | POA: Diagnosis not present

## 2021-04-15 DIAGNOSIS — R32 Unspecified urinary incontinence: Secondary | ICD-10-CM | POA: Diagnosis not present

## 2021-04-15 DIAGNOSIS — N9989 Other postprocedural complications and disorders of genitourinary system: Secondary | ICD-10-CM | POA: Diagnosis not present

## 2021-04-21 ENCOUNTER — Ambulatory Visit: Payer: Medicare HMO | Admitting: Cardiology

## 2021-05-02 ENCOUNTER — Other Ambulatory Visit: Payer: Self-pay

## 2021-05-02 ENCOUNTER — Ambulatory Visit: Payer: Medicare HMO | Admitting: Urology

## 2021-05-02 ENCOUNTER — Encounter: Payer: Self-pay | Admitting: Urology

## 2021-05-02 MED ORDER — CEPHALEXIN 250 MG PO CAPS: 250.0000 mg | ORAL_CAPSULE | Freq: Every day | ORAL | 11 refills | Status: DC

## 2021-05-03 ENCOUNTER — Other Ambulatory Visit: Payer: Self-pay | Admitting: Family Medicine

## 2021-05-03 ENCOUNTER — Encounter: Payer: Self-pay | Admitting: Family Medicine

## 2021-05-03 ENCOUNTER — Other Ambulatory Visit: Payer: Self-pay

## 2021-05-03 DIAGNOSIS — E1142 Type 2 diabetes mellitus with diabetic polyneuropathy: Secondary | ICD-10-CM

## 2021-05-03 MED ORDER — SEMAGLUTIDE (2 MG/DOSE) 8 MG/3ML ~~LOC~~ SOPN: 2.0000 mg | PEN_INJECTOR | SUBCUTANEOUS | 0 refills | Status: DC

## 2021-05-03 NOTE — Telephone Encounter (Signed)
Patient is asking that this be sent in for 3 month supply

## 2021-05-16 ENCOUNTER — Other Ambulatory Visit: Payer: Self-pay

## 2021-05-16 ENCOUNTER — Ambulatory Visit (INDEPENDENT_AMBULATORY_CARE_PROVIDER_SITE_OTHER): Payer: Medicare HMO | Admitting: Family Medicine

## 2021-05-16 ENCOUNTER — Encounter: Payer: Self-pay | Admitting: Family Medicine

## 2021-05-16 VITALS — BP 135/68 | HR 89 | Temp 98.1°F | Wt 209.0 lb

## 2021-05-16 DIAGNOSIS — I152 Hypertension secondary to endocrine disorders: Secondary | ICD-10-CM

## 2021-05-16 DIAGNOSIS — N9989 Other postprocedural complications and disorders of genitourinary system: Secondary | ICD-10-CM

## 2021-05-16 DIAGNOSIS — E1159 Type 2 diabetes mellitus with other circulatory complications: Secondary | ICD-10-CM

## 2021-05-16 DIAGNOSIS — M069 Rheumatoid arthritis, unspecified: Secondary | ICD-10-CM | POA: Diagnosis not present

## 2021-05-16 DIAGNOSIS — E785 Hyperlipidemia, unspecified: Secondary | ICD-10-CM

## 2021-05-16 DIAGNOSIS — E1142 Type 2 diabetes mellitus with diabetic polyneuropathy: Secondary | ICD-10-CM

## 2021-05-16 DIAGNOSIS — E1169 Type 2 diabetes mellitus with other specified complication: Secondary | ICD-10-CM

## 2021-05-16 MED ORDER — TAMSULOSIN HCL 0.4 MG PO CAPS: 0.4000 mg | ORAL_CAPSULE | Freq: Every day | ORAL | 3 refills | Status: DC

## 2021-05-16 NOTE — Assessment & Plan Note (Signed)
Repeat LP today; goal of LDL <70 recommend diet low in saturated fat and regular exercise - 30 min at least 5 times per week

## 2021-05-16 NOTE — Assessment & Plan Note (Signed)
Chronic, stable on home readings Continue medications Goal <130/<80

## 2021-05-16 NOTE — Progress Notes (Signed)
[name],acting as a scribe for [name], FNP.,have documented all relevant documentation on the behalf of [name], FNP,as directed by  [name], FNP while in the presence of [name], FNP.     Established patient visit   Patient: Jean Gonzales   DOB: 03-14-1956   66 y.o. Female  MRN: [id number] Visit Date: 05/16/2021  Today's healthcare provider: [name], FNP   No chief complaint on file.  Subjective    HPI  Diabetes Mellitus Type II, Follow-up  Lab Results  Component Value Date   HGBA1C 6.5 (A) 02/16/2021   HGBA1C 8.7 (A) 11/11/2020   HGBA1C 8.4 (A) 08/12/2020   Wt Readings from Last 3 Encounters:  05/16/21 209 lb (94.8 kg)  02/21/21 216 lb (98 kg)  02/16/21 216 lb 12.8 oz (98.3 kg)   Last seen for diabetes 3 months ago.  Management since then includes none. She reports good compliance with treatment. She is not having side effects.  Symptoms: No fatigue No foot ulcerations  No appetite changes No nausea  No paresthesia of the feet  No polydipsia  No polyuria No visual disturbances   No vomiting     Home blood sugar records:  160-200  Episodes of hypoglycemia? No    Current insulin regiment: none Most Recent Eye Exam: 11/2020 Current exercise: none Current diet habits: NO CARBS  Pertinent Labs: Lab Results  Component Value Date   CHOL 223 (H) 01/17/2021   HDL 35 (L) 01/17/2021   LDLCALC 135 (H) 01/17/2021   TRIG 295 (H) 01/17/2021   CHOLHDL 6.4 (H) 01/17/2021   Lab Results  Component Value Date   NA 138 08/12/2020   K 3.8 08/12/2020   CREATININE 0.90 08/12/2020   EGFR 71 08/12/2020   MICROALBUR 20 10/08/2019   LABMICR See below: 02/21/2021     --------------------------------------------------------------------------------------------------- PATIENT IS A LITTLE TOO SOON FOR FOLLOW UP OF HER A1C  Medications: Outpatient Medications Prior to Visit  Medication Sig   acetaminophen (TYLENOL) 325 MG  tablet Take 1-2 tablets (325-650 mg total) by mouth every 6 (six) hours as needed for mild pain (pain score 1-3 or temp > 100.5).   Alcohol Swabs 70 % PADS 100 each by Does not apply route daily.   atorvastatin (LIPITOR) 80 MG tablet Take 1 tablet (80 mg total) by mouth daily. TAKE 1 TABLET BY MOUTH EVERY DAY IN THE MORNING   Blood Glucose Monitoring Suppl (ONE TOUCH ULTRA 2) w/Device KIT Use as directed   celecoxib (CELEBREX) 100 MG capsule Take 1 capsule (100 mg total) by mouth 2 (two) times daily.   cephALEXin (KEFLEX) 250 MG capsule Take 1 capsule (250 mg total) by mouth daily.   ciprofloxacin (CIPRO) 250 MG tablet Take 1 tablet (250 mg total) by mouth 2 (two) times daily.   diclofenac sodium (VOLTAREN) 1 % GEL Apply 2 g topically 4 (four) times daily. Apply to knee   esomeprazole (NEXIUM) 40 MG capsule TAKE 1 CAPSULE BY MOUTH EVERY DAY   fenofibrate (TRICOR) 145 MG tablet Take 1 tablet (145 mg total) by mouth daily.   fluconazole (DIFLUCAN) 150 MG tablet Take 150 mg by mouth once.   glucose blood (ONETOUCH VERIO) test strip Use as instructed   hydrochlorothiazide (MICROZIDE) 12.5 MG capsule Take 1 capsule (12.5 mg total) by mouth daily.   nystatin (MYCOSTATIN/NYSTOP) powder Apply topically 3 (three) times daily.   ondansetron (ZOFRAN ODT) 4 MG disintegrating tablet Take 1  tablet (4 mg total) by mouth every 8 (eight) hours as needed for nausea or vomiting.   OneTouch Delica Lancets 81E MISC Use as directed   Semaglutide, 2 MG/DOSE, 8 MG/3ML SOPN Inject 2 mg as directed once a week.   triamcinolone ointment (KENALOG) 0.1 % APPLY TO AFFECTED AREA TWICE A DAY   Turmeric, Curcuma Longa, (CURCUMIN) POWD by Does not apply route. With ginger and black seed   zolpidem (AMBIEN) 10 MG tablet TAKE 1 TABLET BY MOUTH AT BEDTIME AS NEEDED FOR SLEEP.   [DISCONTINUED] furosemide (LASIX) 20 MG tablet TAKE 1 TABLET BY MOUTH TWICE A DAY   [DISCONTINUED] tamsulosin (FLOMAX) 0.4 MG CAPS capsule Take 1 capsule  (0.4 mg total) by mouth daily.   nitroGLYCERIN (NITROSTAT) 0.4 MG SL tablet Place 1 tablet (0.4 mg total) under the tongue every 5 (five) minutes as needed for chest pain. For a maximum of 3 doses in one day.   No facility-administered medications prior to visit.    Review of Systems      Objective    BP (!) 150/97 (BP Location: Right Arm, Patient Position: Sitting, Cuff Size: Normal)    Pulse 89    Temp 98.1 F (36.7 C) (Oral)    Wt 209 lb (94.8 kg)    SpO2 100%    BMI 32.01 kg/m     Physical Exam Vitals and nursing note reviewed.  Constitutional:      General: She is not in acute distress.    Appearance: Normal appearance. She is obese. She is not ill-appearing, toxic-appearing or diaphoretic.  HENT:     Head: Normocephalic and atraumatic.  Cardiovascular:     Rate and Rhythm: Normal rate and regular rhythm.     Pulses: Normal pulses.     Heart sounds: Normal heart sounds. No murmur heard.   No friction rub. No gallop.  Pulmonary:     Effort: Pulmonary effort is normal. No respiratory distress.     Breath sounds: Normal breath sounds. No stridor. No wheezing, rhonchi or rales.  Chest:     Chest wall: No tenderness.  Abdominal:     General: Bowel sounds are normal.     Palpations: Abdomen is soft.  Musculoskeletal:        General: No swelling, tenderness, deformity or signs of injury. Normal range of motion.     Right lower leg: No edema.     Left lower leg: No edema.  Skin:    General: Skin is warm and dry.     Capillary Refill: Capillary refill takes less than 2 seconds.     Coloration: Skin is not jaundiced or pale.     Findings: No bruising, erythema, lesion or rash.  Neurological:     General: No focal deficit present.     Mental Status: She is alert and oriented to person, place, and time. Mental status is at baseline.     Cranial Nerves: No cranial nerve deficit.     Sensory: No sensory deficit.     Motor: No weakness.     Coordination: Coordination normal.   Psychiatric:        Mood and Affect: Mood normal.        Behavior: Behavior normal.        Thought Content: Thought content normal.        Judgment: Judgment normal.      No results found for any visits on 05/16/21.  Assessment & Plan     Problem List  Items Addressed This Visit       Cardiovascular and Mediastinum   Hypertension associated with diabetes ([place]) - Primary    Chronic, stable on home readings Continue medications Goal <130/<80      Relevant Orders   Comprehensive metabolic panel     Endocrine   Hyperlipidemia associated with type 2 diabetes mellitus ([place])    Repeat LP today; goal of LDL <70 recommend diet low in saturated fat and regular exercise - 30 min at least 5 times per week       Relevant Orders   Lipid panel   Diabetic polyneuropathy associated with type 2 diabetes mellitus ([place])    Doing well on heart healthy diet with low carbs Has increased medication to 2- Ozempic; no complaints of GI side effects Check A1c at this time- pt made aware that A1c may not be covered as before 90 days      Relevant Orders   Hemoglobin A1c     Musculoskeletal and Integument   Rheumatoid arthritis (HCC)    Chronic, wax/wane F/b rheum Reports difficulty with joint pain and reported joint swelling s/p COVID in 02/2021      Other Visit Diagnoses     Anastomotic stricture of urinary tract       Relevant Medications   tamsulosin (FLOMAX) 0.4 MG CAPS capsule        Return in about 3 months (around 08/13/2021) for chonic disease management.      Vonna Kotyk, FNP, have reviewed all documentation for this visit. The documentation on 05/16/21 for the exam, diagnosis, procedures, and orders are all accurate and complete.    [name], [place] [phone] (phone) [phone] (fax)  [place]

## 2021-05-16 NOTE — Assessment & Plan Note (Signed)
Doing well on heart healthy diet with low carbs Has increased medication to 2- Ozempic; no complaints of GI side effects Check A1c at this time- pt made aware that A1c may not be covered as before 90 days

## 2021-05-16 NOTE — Assessment & Plan Note (Signed)
Chronic, wax/wane F/b rheum Reports difficulty with joint pain and reported joint swelling s/p COVID in 02/2021

## 2021-05-17 ENCOUNTER — Encounter: Payer: Self-pay | Admitting: Family Medicine

## 2021-05-17 LAB — COMPREHENSIVE METABOLIC PANEL
ALT: 21 IU/L (ref 0–32)
AST: 22 IU/L (ref 0–40)
Albumin/Globulin Ratio: 1.7 (ref 1.2–2.2)
Albumin: 4.7 g/dL (ref 3.8–4.8)
Alkaline Phosphatase: 95 IU/L (ref 44–121)
BUN/Creatinine Ratio: 11 — ABNORMAL LOW (ref 12–28)
BUN: 9 mg/dL (ref 8–27)
Bilirubin Total: 0.4 mg/dL (ref 0.0–1.2)
CO2: 24 mmol/L (ref 20–29)
Calcium: 10.4 mg/dL — ABNORMAL HIGH (ref 8.7–10.3)
Chloride: 101 mmol/L (ref 96–106)
Creatinine, Ser: 0.85 mg/dL (ref 0.57–1.00)
Globulin, Total: 2.7 g/dL (ref 1.5–4.5)
Glucose: 113 mg/dL — ABNORMAL HIGH (ref 70–99)
Potassium: 3.6 mmol/L (ref 3.5–5.2)
Sodium: 141 mmol/L (ref 134–144)
Total Protein: 7.4 g/dL (ref 6.0–8.5)
eGFR: 75 mL/min/{1.73_m2} (ref >59)

## 2021-05-17 LAB — HEMOGLOBIN A1C
Est. average glucose Bld gHb Est-mCnc: 126 mg/dL
Hgb A1c MFr Bld: 6 % — ABNORMAL HIGH (ref 4.8–5.6)

## 2021-05-17 LAB — LIPID PANEL
Chol/HDL Ratio: 4.4 ratio (ref 0.0–4.4)
Cholesterol, Total: 169 mg/dL (ref 100–199)
HDL: 38 mg/dL — ABNORMAL LOW (ref >39)
LDL Chol Calc (NIH): 94 mg/dL (ref 0–99)
Triglycerides: 217 mg/dL — ABNORMAL HIGH (ref 0–149)
VLDL Cholesterol Cal: 37 mg/dL (ref 5–40)

## 2021-05-18 ENCOUNTER — Other Ambulatory Visit: Payer: Self-pay | Admitting: Family Medicine

## 2021-05-18 ENCOUNTER — Encounter: Payer: Self-pay | Admitting: Family Medicine

## 2021-05-18 ENCOUNTER — Other Ambulatory Visit: Payer: Self-pay

## 2021-05-18 DIAGNOSIS — F5101 Primary insomnia: Secondary | ICD-10-CM

## 2021-05-18 MED ORDER — ROSUVASTATIN CALCIUM 20 MG PO TABS: 20.0000 mg | ORAL_TABLET | Freq: Every day | ORAL | 3 refills | Status: DC

## 2021-05-18 MED ORDER — ZOLPIDEM TARTRATE 5 MG PO TABS: 5.0000 mg | ORAL_TABLET | Freq: Every evening | ORAL | 0 refills | Status: DC | PRN

## 2021-05-18 NOTE — Telephone Encounter (Signed)
LOV 05/16/21 NOV none LRF 01/20/2019 90 x 1

## 2021-06-06 ENCOUNTER — Ambulatory Visit (INDEPENDENT_AMBULATORY_CARE_PROVIDER_SITE_OTHER): Payer: Medicare HMO | Admitting: Urology

## 2021-06-06 ENCOUNTER — Other Ambulatory Visit: Payer: Self-pay

## 2021-06-06 VITALS — BP 153/88 | HR 83 | Ht 67.0 in | Wt 205.0 lb

## 2021-06-06 DIAGNOSIS — N3020 Other chronic cystitis without hematuria: Secondary | ICD-10-CM | POA: Diagnosis not present

## 2021-06-06 MED ORDER — CEPHALEXIN 250 MG PO CAPS: 250.0000 mg | ORAL_CAPSULE | Freq: Every day | ORAL | 11 refills | Status: DC

## 2021-06-06 NOTE — Progress Notes (Signed)
06/06/2021 1:49 PM   Jean Gonzales Sep 03, 1955 [id number]  Referring provider: [name], [address] [place],  [place] [address]  Chief Complaint  Patient presents with   Follow-up    HPI: [place]: Recurrent urinary tract infections with multiple positive cultures.  Vaginal atrophy with history of endometrial carcinoma.  She may have urethral stenosis from pelvic surgery.  Had normal cystoscopy and was on low-dose Macrodantin.  She catheterizes once a day and gets residuals of 500 to 700 mL.  Has been given Flomax in the past.  Normal renal x-ray December 2021   The patient had a hysterectomy followed by chemotherapy and radiation for endometrial carcinoma about 6 years ago.  It appears last 2 years she was being dilated at a rate such that she started to catheterize even daily.  Now she catheterizes once a day.  She could not get 500 cc to a liter.  She generally voids 3-4 times a day only a few cc.  She gets up once a night.  When she voids her flow was weak.  She hesitates.  She does not strain.  She does not feel empty  She gets recurrent bladder infections with dark cloudy foul-smelling urine that respond favorably antibiotics.  She is in serious infection at least monthly or more often.  Few days later symptoms returned.  No pain or fever   Her urethral stenosis has dated back before 2019 when I reviewed the consult note.  She has rare incontinence and wears a pad for confidence.   She is convinced that antibiotics are not working.  It appears she had been on Macrodantin for 3 months but others have not been tried     We talked about colonization and over treating mild symptoms.  Educated patient about pathophysiology of recurrent urinary tract infections superimposed on high residuals and catheterizing.  Reassess in 6 weeks on daily Keflex 250 mg.  She gets a rash from sulfa.  Trimethoprim potentially an option.  Reeducation regarding history of discussed.   Probiotics discussed.  Cranberry discussed.  Reassess 6-week   I do not think there is any role for urodynamics or sacral nerve stimulation at this stage   Patient was on the daily Keflex and still had foul-smelling cloudy urine.  She is hoping to get an antibiotic for 7 to 14 days to cleared up and she is trying an over-the-counter preparation that coats the lining of the bladder.  She still catheterizes once a day.  I called in ciprofloxacin 250 mg twice a day for 10 days.  She understands that we may not be able to prevent her urine from being foul-smelling and cloudy but we have limited tools including daily Keflex.  She has been on Macrodantin daily in the past.    call if urine culture differs.  Called in ciprofloxacin for 10 days.  Utilize over-the-counter preparation.  Continue to catheterize once a day.  We both agreed to stay on the daily Keflex.  She will increase catheterization to once in the morning and once at night hoping this helps some.  In the past she is actually done 3 times a day which is probably not necessary.  She understands prophylaxis options are limited.  Today Incomplete bladder emptying stable.  Last culture positive.  Patient has done quite well on catheterizing twice a day, once a day Keflex and taking over-the-counter UQORA twice a day.  She was doing really well until she got COVID on Christmas  Day.  She is back on track and clinically not infected    PMH: Past Medical History:  Diagnosis Date   Ankylosing spondylitis ([place])    Arthritis    osteoarthritis right knee   Cancer ([place]) 2014   endometrial CA   Diabetes mellitus without complication (HCC)    diet controlled   Endometrial cancer ([place])    GERD (gastroesophageal reflux disease)    Hyperlipidemia    Hypertension    improved after ca tx    Surgical History: Past Surgical History:  Procedure Laterality Date   ABDOMINAL HYSTERECTOMY  2015   cancer   BREAST BIOPSY Right 2008ish   core    CATARACT EXTRACTION W/PHACO Right 02/05/2018   Procedure: CATARACT EXTRACTION PHACO AND INTRAOCULAR LENS PLACEMENT ([place]) RIGHT DIABETES IVA TOPICAL;  Surgeon: Eulogio Bear, MD;  Location: [facility];  Service: Ophthalmology;  Laterality: Right;   CATARACT EXTRACTION W/PHACO Left 02/25/2018   Procedure: CATARACT EXTRACTION PHACO AND INTRAOCULAR LENS PLACEMENT ([place]) LEFT DIABETIC;  Surgeon: Eulogio Bear, MD;  Location: [facility];  Service: Ophthalmology;  Laterality: Left;   CHOLECYSTECTOMY  1992   COLONOSCOPY WITH PROPOFOL N/A 08/08/2019   Procedure: COLONOSCOPY WITH PROPOFOL;  Surgeon: Jonathon Bellows, MD;  Location: [facility] ENDOSCOPY;  Service: Gastroenterology;  Laterality: N/A;   EYE SURGERY     TONSILLECTOMY  1973   TONSILLECTOMY     TOTAL KNEE ARTHROPLASTY Right 07/09/2017   Procedure: TOTAL KNEE ARTHROPLASTY;  Surgeon: Lovell Sheehan, MD;  Location: [facility] ORS;  Service: Orthopedics;  Laterality: Right;    Home Medications:  Allergies as of 06/06/2021       Reactions   Iodinated Contrast Media Anaphylaxis   Bactrim [sulfamethoxazole-trimethoprim] Itching, Rash        Medication List        Accurate as of June 06, 2021  1:49 PM. If you have any questions, ask your nurse or doctor.          acetaminophen 325 MG tablet Commonly known as: TYLENOL Take 1-2 tablets (325-650 mg total) by mouth every 6 (six) hours as needed for mild pain (pain score 1-3 or temp > 100.5).   Alcohol Swabs 70 % Pads 100 each by Does not apply route daily.   celecoxib 100 MG capsule Commonly known as: CELEBREX Take 1 capsule (100 mg total) by mouth 2 (two) times daily.   cephALEXin 250 MG capsule Commonly known as: Keflex Take 1 capsule (250 mg total) by mouth daily.   ciprofloxacin 250 MG tablet Commonly known as: Cipro Take 1 tablet (250 mg total) by mouth 2 (two) times daily.   Curcumin Powd by Does not apply route. With ginger and black seed   diclofenac  sodium 1 % Gel Commonly known as: VOLTAREN Apply 2 g topically 4 (four) times daily. Apply to knee   esomeprazole 40 MG capsule Commonly known as: NEXIUM TAKE 1 CAPSULE BY MOUTH EVERY DAY   fenofibrate 145 MG tablet Commonly known as: Tricor Take 1 tablet (145 mg total) by mouth daily.   fluconazole 150 MG tablet Commonly known as: DIFLUCAN Take 150 mg by mouth once.   hydrochlorothiazide 12.5 MG capsule Commonly known as: Microzide Take 1 capsule (12.5 mg total) by mouth daily.   nitroGLYCERIN 0.4 MG SL tablet Commonly known as: NITROSTAT Place 1 tablet (0.4 mg total) under the tongue every 5 (five) minutes as needed for chest pain. For a maximum of 3 doses in one day.  nystatin powder Commonly known as: MYCOSTATIN/NYSTOP Apply topically 3 (three) times daily.   ondansetron 4 MG disintegrating tablet Commonly known as: Zofran ODT Take 1 tablet (4 mg total) by mouth every 8 (eight) hours as needed for nausea or vomiting.   ONE TOUCH ULTRA 2 w/Device Kit Use as directed   OneTouch Delica Lancets 32G Misc Use as directed   OneTouch Verio test strip Generic drug: glucose blood Use as instructed   rosuvastatin 20 MG tablet Commonly known as: Crestor Take 1 tablet (20 mg total) by mouth daily.   Semaglutide (2 MG/DOSE) 8 MG/3ML Sopn Inject 2 mg as directed once a week.   tamsulosin 0.4 MG Caps capsule Commonly known as: FLOMAX Take 1 capsule (0.4 mg total) by mouth daily.   triamcinolone ointment 0.1 % Commonly known as: KENALOG APPLY TO AFFECTED AREA TWICE A DAY   zolpidem 5 MG tablet Commonly known as: AMBIEN Take 1 tablet (5 mg total) by mouth at bedtime as needed for sleep.        Allergies:  Allergies  Allergen Reactions   Iodinated Contrast Media Anaphylaxis   Bactrim [Sulfamethoxazole-Trimethoprim] Itching and Rash    Family History: Family History  Problem Relation Age of Onset   Diabetes Mother    Cancer Mother    Heart disease Father     Breast cancer Neg Hx    Kidney cancer Neg Hx    Bladder Cancer Neg Hx     Social History:  reports that she has never smoked. She has never used smokeless tobacco. She reports that she does not drink alcohol and does not use drugs.  ROS:                                        Physical Exam: There were no vitals taken for this visit.  Constitutional:  Alert and oriented, No acute distress. HEENT: [place] AT, moist mucus membranes.  Trachea midline, no masses.   Laboratory Data: Lab Results  Component Value Date   WBC 16.0 (H) 03/09/2020   HGB 13.6 03/09/2020   HCT 39.3 03/09/2020   MCV 87.1 03/09/2020   PLT 154 03/09/2020    Lab Results  Component Value Date   CREATININE 0.85 05/16/2021    No results found for: PSA  No results found for: TESTOSTERONE  Lab Results  Component Value Date   HGBA1C 6.0 (H) 05/16/2021    Urinalysis    Component Value Date/Time   COLORURINE YELLOW (A) 03/09/2020 1113   APPEARANCEUR Cloudy (A) 02/21/2021 1133   LABSPEC 1.009 03/09/2020 1113   PHURINE 6.0 03/09/2020 1113   GLUCOSEU Negative 02/21/2021 1133   HGBUR SMALL (A) 03/09/2020 1113   BILIRUBINUR Negative 02/21/2021 1133   KETONESUR NEGATIVE 03/09/2020 1113   PROTEINUR Negative 02/21/2021 1133   PROTEINUR 30 (A) 03/09/2020 1113   UROBILINOGEN 0.2 07/14/2020 1654   UROBILINOGEN 0.2 01/25/2007 1533   NITRITE Negative 02/21/2021 1133   NITRITE NEGATIVE 03/09/2020 1113   LEUKOCYTESUR 2+ (A) 02/21/2021 1133   LEUKOCYTESUR LARGE (A) 03/09/2020 1113    Pertinent Imaging:   Assessment & Plan: Prescription renewed and I will see in 1 year  There are no diagnoses linked to this encounter.  No follow-ups on file.  Reece Packer, MD  [place] [facility] [address], [place],  [address] [phone]

## 2021-06-23 DIAGNOSIS — R32 Unspecified urinary incontinence: Secondary | ICD-10-CM | POA: Diagnosis not present

## 2021-06-23 DIAGNOSIS — N9989 Other postprocedural complications and disorders of genitourinary system: Secondary | ICD-10-CM | POA: Diagnosis not present

## 2021-06-23 DIAGNOSIS — C541 Malignant neoplasm of endometrium: Secondary | ICD-10-CM | POA: Diagnosis not present

## 2021-06-24 DIAGNOSIS — M175 Other unilateral secondary osteoarthritis of knee: Secondary | ICD-10-CM | POA: Diagnosis not present

## 2021-06-24 DIAGNOSIS — M159 Polyosteoarthritis, unspecified: Secondary | ICD-10-CM | POA: Diagnosis not present

## 2021-06-24 DIAGNOSIS — L409 Psoriasis, unspecified: Secondary | ICD-10-CM | POA: Diagnosis not present

## 2021-06-24 DIAGNOSIS — Z79899 Other long term (current) drug therapy: Secondary | ICD-10-CM | POA: Diagnosis not present

## 2021-06-24 DIAGNOSIS — L4050 Arthropathic psoriasis, unspecified: Secondary | ICD-10-CM | POA: Diagnosis not present

## 2021-07-07 ENCOUNTER — Telehealth: Payer: Self-pay | Admitting: Family Medicine

## 2021-07-07 NOTE — Telephone Encounter (Signed)
Copied from [place] [id number]. Topic: Medicare AWV ?>> Jul 07, 2021  1:24 PM Cher Nakai R wrote: ?Reason for CRM:  ?Left message for patient to call back and schedule Medicare Annual Wellness Visit (AWV) in office.  ? ?If unable to come into the office for AWV,  please offer to do virtually or by telephone. ? ?Last AWV: 07/03/2019 ? ?Please schedule at anytime with [facility] Health Advisor. ? ?30 minute appointment for Virtual or phone ?45 minute appointment for in office or Initial virtual/phone ? ?Any questions, please contact me at [phone] ?

## 2021-07-13 DIAGNOSIS — C541 Malignant neoplasm of endometrium: Secondary | ICD-10-CM | POA: Diagnosis not present

## 2021-07-13 DIAGNOSIS — N9989 Other postprocedural complications and disorders of genitourinary system: Secondary | ICD-10-CM | POA: Diagnosis not present

## 2021-07-13 DIAGNOSIS — R32 Unspecified urinary incontinence: Secondary | ICD-10-CM | POA: Diagnosis not present

## 2021-07-25 ENCOUNTER — Other Ambulatory Visit: Payer: Self-pay | Admitting: Family Medicine

## 2021-07-28 ENCOUNTER — Telehealth: Payer: Self-pay | Admitting: Family Medicine

## 2021-07-28 NOTE — Telephone Encounter (Signed)
Copied from [place] [id number]. Topic: Medicare AWV ?>> Jul 28, 2021  9:27 AM Cher Nakai R wrote: ?Reason for CRM:  ?Spoke with the patient  - she asked that she call back later to schedule Medicare Annual Wellness Visit (AWV) in office.  ? ?If unable to come into the office for AWV,  please offer to do virtually or by telephone. ? ?Last AWV: 07/03/2019 ? ?Please schedule at anytime with [facility] Health Advisor. ? ?30 minute appointment for Virtual or phone ?45 minute appointment for in office or Initial virtual/phone ? ?Any questions, please contact me at [phone] ?

## 2021-08-02 DIAGNOSIS — R32 Unspecified urinary incontinence: Secondary | ICD-10-CM | POA: Diagnosis not present

## 2021-08-02 DIAGNOSIS — N9989 Other postprocedural complications and disorders of genitourinary system: Secondary | ICD-10-CM | POA: Diagnosis not present

## 2021-08-02 DIAGNOSIS — C541 Malignant neoplasm of endometrium: Secondary | ICD-10-CM | POA: Diagnosis not present

## 2021-09-02 DIAGNOSIS — R32 Unspecified urinary incontinence: Secondary | ICD-10-CM | POA: Diagnosis not present

## 2021-09-02 DIAGNOSIS — N9989 Other postprocedural complications and disorders of genitourinary system: Secondary | ICD-10-CM | POA: Diagnosis not present

## 2021-09-02 DIAGNOSIS — C541 Malignant neoplasm of endometrium: Secondary | ICD-10-CM | POA: Diagnosis not present

## 2021-09-18 ENCOUNTER — Other Ambulatory Visit: Payer: Self-pay | Admitting: Family Medicine

## 2021-09-18 DIAGNOSIS — K2100 Gastro-esophageal reflux disease with esophagitis, without bleeding: Secondary | ICD-10-CM

## 2021-09-19 ENCOUNTER — Ambulatory Visit (INDEPENDENT_AMBULATORY_CARE_PROVIDER_SITE_OTHER): Payer: Medicare HMO | Admitting: Family Medicine

## 2021-09-19 ENCOUNTER — Encounter: Payer: Self-pay | Admitting: Family Medicine

## 2021-09-19 VITALS — BP 139/87 | HR 73 | Temp 98.0°F | Resp 16 | Ht 67.0 in | Wt 193.4 lb

## 2021-09-19 DIAGNOSIS — I152 Hypertension secondary to endocrine disorders: Secondary | ICD-10-CM | POA: Diagnosis not present

## 2021-09-19 DIAGNOSIS — E1142 Type 2 diabetes mellitus with diabetic polyneuropathy: Secondary | ICD-10-CM | POA: Diagnosis not present

## 2021-09-19 DIAGNOSIS — R300 Dysuria: Secondary | ICD-10-CM | POA: Insufficient documentation

## 2021-09-19 DIAGNOSIS — M069 Rheumatoid arthritis, unspecified: Secondary | ICD-10-CM | POA: Diagnosis not present

## 2021-09-19 DIAGNOSIS — E1159 Type 2 diabetes mellitus with other circulatory complications: Secondary | ICD-10-CM

## 2021-09-19 DIAGNOSIS — K2100 Gastro-esophageal reflux disease with esophagitis, without bleeding: Secondary | ICD-10-CM | POA: Diagnosis not present

## 2021-09-19 LAB — POCT URINALYSIS DIPSTICK
Bilirubin, UA: NEGATIVE
Blood, UA: NEGATIVE
Glucose, UA: NEGATIVE
Ketones, UA: NEGATIVE
Nitrite, UA: NEGATIVE
Protein, UA: NEGATIVE
Spec Grav, UA: 1.01 (ref 1.010–1.025)
Urobilinogen, UA: 0.2 E.U./dL
pH, UA: 6 (ref 5.0–8.0)

## 2021-09-19 LAB — POCT GLYCOSYLATED HEMOGLOBIN (HGB A1C)
Est. average glucose Bld gHb Est-mCnc: 117
Hemoglobin A1C: 5.7 % — AB (ref 4.0–5.6)

## 2021-09-19 MED ORDER — CELECOXIB 100 MG PO CAPS: 100.0000 mg | ORAL_CAPSULE | Freq: Two times a day (BID) | ORAL | 1 refills | Status: DC

## 2021-09-19 MED ORDER — CIPROFLOXACIN HCL 500 MG PO TABS: 500.0000 mg | ORAL_TABLET | Freq: Two times a day (BID) | ORAL | 0 refills | Status: AC

## 2021-09-19 NOTE — Assessment & Plan Note (Signed)
Chronic, stable Continue use of celebrex 100 mg BID to assist

## 2021-09-19 NOTE — Assessment & Plan Note (Signed)
Chronic, A1c remains stable On Statin Complains of L foot pain/unable to fully extend knee d/t limitations Plans to reach back out to ortho Continue to recommend balanced, lower carb meals. Smaller meal size, adding snacks. Choosing water as drink of choice and increasing purposeful exercise.

## 2021-09-20 LAB — MICROALBUMIN / CREATININE URINE RATIO
Creatinine, Urine: 51.3 mg/dL
Microalb/Creat Ratio: 50 mg/g creat — ABNORMAL HIGH (ref 0–29)
Microalbumin, Urine: 25.6 ug/mL

## 2021-09-20 LAB — SPECIMEN STATUS REPORT

## 2021-09-26 DIAGNOSIS — M170 Bilateral primary osteoarthritis of knee: Secondary | ICD-10-CM | POA: Diagnosis not present

## 2021-09-26 DIAGNOSIS — Z96652 Presence of left artificial knee joint: Secondary | ICD-10-CM | POA: Diagnosis not present

## 2021-09-26 LAB — URINE CULTURE

## 2021-09-26 LAB — SPECIMEN STATUS REPORT

## 2021-09-28 NOTE — Progress Notes (Signed)
Ecoli seen on urine culture; Cipro was an appropriate antibiotic for this bacteria. Please let us know if symptoms continue; recommend daily antibiotic is resumed if not already started.  [name], [address] [place], [place] [address] [phone] (phone) [phone] (fax) [place]

## 2021-10-10 DIAGNOSIS — C541 Malignant neoplasm of endometrium: Secondary | ICD-10-CM | POA: Diagnosis not present

## 2021-10-10 DIAGNOSIS — N9989 Other postprocedural complications and disorders of genitourinary system: Secondary | ICD-10-CM | POA: Diagnosis not present

## 2021-10-10 DIAGNOSIS — R32 Unspecified urinary incontinence: Secondary | ICD-10-CM | POA: Diagnosis not present

## 2021-10-18 DIAGNOSIS — M25562 Pain in left knee: Secondary | ICD-10-CM | POA: Insufficient documentation

## 2021-10-20 DIAGNOSIS — E669 Obesity, unspecified: Secondary | ICD-10-CM | POA: Diagnosis not present

## 2021-10-20 DIAGNOSIS — E1142 Type 2 diabetes mellitus with diabetic polyneuropathy: Secondary | ICD-10-CM | POA: Diagnosis not present

## 2021-10-20 DIAGNOSIS — K219 Gastro-esophageal reflux disease without esophagitis: Secondary | ICD-10-CM | POA: Diagnosis not present

## 2021-10-20 DIAGNOSIS — N390 Urinary tract infection, site not specified: Secondary | ICD-10-CM | POA: Diagnosis not present

## 2021-10-20 DIAGNOSIS — I700 Atherosclerosis of aorta: Secondary | ICD-10-CM | POA: Diagnosis not present

## 2021-10-20 DIAGNOSIS — E785 Hyperlipidemia, unspecified: Secondary | ICD-10-CM | POA: Diagnosis not present

## 2021-10-20 DIAGNOSIS — M1990 Unspecified osteoarthritis, unspecified site: Secondary | ICD-10-CM | POA: Diagnosis not present

## 2021-10-20 DIAGNOSIS — M069 Rheumatoid arthritis, unspecified: Secondary | ICD-10-CM | POA: Diagnosis not present

## 2021-10-20 DIAGNOSIS — G4700 Insomnia, unspecified: Secondary | ICD-10-CM | POA: Diagnosis not present

## 2021-10-20 DIAGNOSIS — I2510 Atherosclerotic heart disease of native coronary artery without angina pectoris: Secondary | ICD-10-CM | POA: Diagnosis not present

## 2021-10-20 DIAGNOSIS — M459 Ankylosing spondylitis of unspecified sites in spine: Secondary | ICD-10-CM | POA: Diagnosis not present

## 2021-10-20 DIAGNOSIS — L4050 Arthropathic psoriasis, unspecified: Secondary | ICD-10-CM | POA: Diagnosis not present

## 2021-10-21 ENCOUNTER — Encounter: Payer: Self-pay | Admitting: Family Medicine

## 2021-10-25 DIAGNOSIS — M25562 Pain in left knee: Secondary | ICD-10-CM | POA: Diagnosis not present

## 2021-10-26 ENCOUNTER — Other Ambulatory Visit: Payer: Self-pay | Admitting: Family Medicine

## 2021-10-26 MED ORDER — ZOLPIDEM TARTRATE 5 MG PO TABS
5.0000 mg | ORAL_TABLET | Freq: Every evening | ORAL | 0 refills | Status: DC | PRN
Start: 2021-10-26 — End: 2021-11-30

## 2021-10-26 NOTE — Telephone Encounter (Signed)
Per patient she is ok now. Reports that Kristopher Oppenheim received some and that since she was on the list she was able to get it.

## 2021-11-01 ENCOUNTER — Encounter: Payer: Self-pay | Admitting: Family Medicine

## 2021-11-01 ENCOUNTER — Ambulatory Visit: Payer: Self-pay

## 2021-11-01 NOTE — Chronic Care Management (AMB) (Signed)
   11/01/2021  [name] 1955-06-12 [id number]   Documentation encounter created to complete case transition. The care management team will continue to follow [name] for care coordination.  [facility] [phone]

## 2021-11-08 DIAGNOSIS — M25562 Pain in left knee: Secondary | ICD-10-CM | POA: Diagnosis not present

## 2021-11-11 DIAGNOSIS — M25562 Pain in left knee: Secondary | ICD-10-CM | POA: Diagnosis not present

## 2021-11-16 DIAGNOSIS — M25562 Pain in left knee: Secondary | ICD-10-CM | POA: Diagnosis not present

## 2021-11-18 DIAGNOSIS — S83206A Unspecified tear of unspecified meniscus, current injury, right knee, initial encounter: Secondary | ICD-10-CM | POA: Diagnosis not present

## 2021-11-22 ENCOUNTER — Encounter: Payer: Self-pay | Admitting: Orthopedic Surgery

## 2021-11-22 ENCOUNTER — Other Ambulatory Visit: Payer: Self-pay | Admitting: Orthopedic Surgery

## 2021-11-22 DIAGNOSIS — Z01818 Encounter for other preprocedural examination: Secondary | ICD-10-CM

## 2021-11-22 NOTE — H&P (Signed)
Jean Gonzales MRN:  [id number] DOB/SEX:  1955-07-19/female  CHIEF COMPLAINT:  Painful left Knee  HISTORY: Patient is a 66 y.o. female presented with a history of pain in the left knee. Onset of symptoms was abrupt starting several weeks ago with rapidly worsening course since that time. Prior procedures on the knee include none. Patient has been treated conservatively with over-the-counter NSAIDs and activity modification. Patient currently rates pain in the knee at 10 out of 10 with activity. There is pain at night.  PAST MEDICAL HISTORY: Patient Active Problem List   Diagnosis Date Noted   Dysuria 09/19/2021   Impetigo 02/16/2021   Flu vaccine need 02/16/2021   Fatigue 11/11/2020   Diabetic polyneuropathy associated with type 2 diabetes mellitus ([place]) 08/12/2020   Ankylosing spondylitis ([place]) 06/28/2020   Bilateral edema of lower extremity 06/28/2020   Morbid obesity ([place]) 10/06/2019   Chest pain 09/29/2017   History of total knee arthroplasty 08/01/2017   Osteoarthritis of right knee 07/09/2017   Osteoarthritis 05/07/2017   Synovitis of knee 12/29/2016   Knee joint effusion 12/29/2016   Acid reflux 02/22/2015   History of migraine headaches 02/22/2015   Hyperlipidemia associated with type 2 diabetes mellitus ([place]) 02/22/2015   Hypertension associated with diabetes ([place]) 02/22/2015   History of colon polyps 02/22/2015   Urinary incontinence 02/22/2015   Psoriatic arthritis ([place]) 02/22/2015   Urethral meatal stenosis 12/03/2013   Rheumatoid arthritis involving multiple sites ([place]) 03/14/2013   Past Medical History:  Diagnosis Date   Ankylosing spondylitis ([place])    Arthritis    osteoarthritis right knee   Cancer ([place]) 2014   endometrial CA   Diabetes mellitus without complication ([place])    diet controlled   Endometrial cancer ([place])    GERD (gastroesophageal reflux disease)    Hyperlipidemia    Hypertension    improved after ca tx   Past Surgical History:   Procedure Laterality Date   ABDOMINAL HYSTERECTOMY  2015   cancer   BREAST BIOPSY Right 2008ish   core   CATARACT EXTRACTION W/PHACO Right 02/05/2018   Procedure: CATARACT EXTRACTION PHACO AND INTRAOCULAR LENS PLACEMENT ([place]) RIGHT DIABETES IVA TOPICAL;  Surgeon: Eulogio Bear, MD;  Location: [facility];  Service: Ophthalmology;  Laterality: Right;   CATARACT EXTRACTION W/PHACO Left 02/25/2018   Procedure: CATARACT EXTRACTION PHACO AND INTRAOCULAR LENS PLACEMENT ([place]) LEFT DIABETIC;  Surgeon: Eulogio Bear, MD;  Location: [facility];  Service: Ophthalmology;  Laterality: Left;   CHOLECYSTECTOMY  1992   COLONOSCOPY WITH PROPOFOL N/A 08/08/2019   Procedure: COLONOSCOPY WITH PROPOFOL;  Surgeon: Jonathon Bellows, MD;  Location[facility] ENDOSCOPY;  Service: Gastroenterology;  Laterality: N/A;   EYE SURGERY     TONSILLECTOMY  1973   TONSILLECTOMY     TOTAL KNEE ARTHROPLASTY Right 07/09/2017   Procedure: TOTAL KNEE ARTHROPLASTY;  Surgeon: Lovell Sheehan, MD;  Location: [facility] ORS;  Service: Orthopedics;  Laterality: Right;     MEDICATIONS:  (Not in a hospital admission)   ALLERGIES:   Allergies  Allergen Reactions   Iodinated Contrast Media Anaphylaxis   Bactrim [Sulfamethoxazole-Trimethoprim] Itching and Rash    REVIEW OF SYSTEMS:  Pertinent items are noted in HPI.   FAMILY HISTORY:   Family History  Problem Relation Age of Onset   Diabetes Mother    Cancer Mother    Heart disease Father    Breast cancer Neg Hx    Kidney cancer Neg Hx    Bladder Cancer Neg Hx  SOCIAL HISTORY:   Social History   Tobacco Use   Smoking status: Never   Smokeless tobacco: Never  Substance Use Topics   Alcohol use: No    Alcohol/week: 0.0 standard drinks of alcohol     EXAMINATION:  Vital signs in last 24 hours: '@VSRANGES'$ @  General appearance: alert, cooperative, and no distress Neck: no JVD and supple, symmetrical, trachea midline Lungs: clear to auscultation  bilaterally Heart: regular rate and rhythm, S1, S2 normal, no murmur, click, rub or gallop Abdomen: soft, non-tender; bowel sounds normal; no masses,  no organomegaly Extremities: extremities normal, atraumatic, no cyanosis or edema and Homans sign is negative, no sign of DVT Pulses: 2+ and symmetric Skin: Skin color, texture, turgor normal. No rashes or lesions Neurologic: Alert and oriented X 3, normal strength and tone. Normal symmetric reflexes. Normal coordination and gait  Musculoskeletal:  ROM 0-110, Ligaments intact,  Imaging Review Left knee meniscal tear  Assessment/Plan: Left knee meniscal tear  Left knee arthroscopy    Carlynn Spry 11/22/2021, 8:40 AM

## 2021-11-28 DIAGNOSIS — R32 Unspecified urinary incontinence: Secondary | ICD-10-CM | POA: Diagnosis not present

## 2021-11-28 DIAGNOSIS — R339 Retention of urine, unspecified: Secondary | ICD-10-CM | POA: Diagnosis not present

## 2021-11-28 DIAGNOSIS — C541 Malignant neoplasm of endometrium: Secondary | ICD-10-CM | POA: Diagnosis not present

## 2021-11-30 ENCOUNTER — Ambulatory Visit: Payer: Self-pay

## 2021-11-30 ENCOUNTER — Encounter
Admission: RE | Admit: 2021-11-30 | Discharge: 2021-11-30 | Disposition: A | Discharge status: Home / Self Care | Payer: Medicare HMO | Source: Ambulatory Visit | Attending: Orthopedic Surgery | Admitting: Orthopedic Surgery

## 2021-11-30 DIAGNOSIS — E119 Type 2 diabetes mellitus without complications: Secondary | ICD-10-CM

## 2021-11-30 DIAGNOSIS — E1169 Type 2 diabetes mellitus with other specified complication: Secondary | ICD-10-CM

## 2021-11-30 DIAGNOSIS — Z01818 Encounter for other preprocedural examination: Secondary | ICD-10-CM

## 2021-11-30 HISTORY — DX: Other specified health status: Z78.9

## 2021-11-30 HISTORY — DX: Personal history of urinary calculi: Z87.442

## 2021-11-30 HISTORY — DX: Arthropathic psoriasis, unspecified: L40.50

## 2021-11-30 HISTORY — DX: Personal history of other diseases of the nervous system and sense organs: Z86.69

## 2021-11-30 HISTORY — DX: Unspecified urethral stricture, male, unspecified site: N35.919

## 2021-11-30 NOTE — Patient Instructions (Addendum)
Your procedure is scheduled on:12-06-21 Tuesday Report to the Registration Desk on the 1st floor of the [facility].Then proceed to the 2nd floor Surgery Desk To find out your arrival time, please call [phone] between 1PM - 3PM on:12-05-21 Monday If your arrival time is 6:00 am, do not arrive prior to that time as the [facility] entrance doors do not open until 6:00 am.  REMEMBER: Instructions that are not followed completely may result in serious medical risk, up to and including death; or upon the discretion of your surgeon and anesthesiologist your surgery may need to be rescheduled.  Do not eat food OR drink any liquids after midnight the night before surgery.  No gum chewing, lozengers or hard candies.  TAKE THESE MEDICATIONS THE MORNING OF SURGERY WITH A SIP OF WATER: -tamsulosin (FLOMAX) -rosuvastatin (CRESTOR)  -esomeprazole ([place]) -take one the night before and one on the morning of surgery - helps to prevent nausea after surgery.)  Stop your OZEMPIC 7 days prior to surgery-Last dose on 11-27-21 Sunday   One week prior to surgery: Stop Anti-inflammatories (NSAIDS) such as Advil, Aleve, Ibuprofen, Motrin, Naproxen, Naprosyn and Aspirin based products such as Excedrin, [place]ys Powder, BC Powder.You may however, take Tylenol if needed for pain up until the day of surgery. You can continue your celecoxib (CELEBREX) up until the day prior to surgery  Stop [place] supplements/vitamins NOW (11-30-21) until after surgery ([place])  No Alcohol for 24 hours before or after surgery.  No Smoking including e-cigarettes for 24 hours prior to surgery.  No chewable tobacco products for at least 6 hours prior to surgery.  No nicotine patches on the day of surgery.  Do not use any "recreational" drugs for at least a week prior to your surgery.  Please be advised that the combination of cocaine and anesthesia may have negative outcomes, up to and including death. If  you test positive for cocaine, your surgery will be cancelled.  On the morning of surgery brush your teeth with toothpaste and water, you may rinse your mouth with mouthwash if you wish. Do not swallow any toothpaste or mouthwash.  Use CHG Soap as directed on instruction sheet.  Do not wear jewelry, make-up, hairpins, clips or nail polish.  Do not wear lotions, powders, or perfumes.   Do not shave body from the neck down 48 hours prior to surgery just in case you cut yourself which could leave a site for infection.  Also, freshly shaved skin may become irritated if using the CHG soap.  Contact lenses, hearing aids and dentures may not be worn into surgery.  Do not bring valuables to the hospital. [facility] is not responsible for any missing/lost belongings or valuables.   Notify your doctor if there is any change in your medical condition (cold, fever, infection).  Wear comfortable clothing (specific to your surgery type) to the hospital.  After surgery, you can help prevent lung complications by doing breathing exercises.  Take deep breaths and cough every 1-2 hours. Your doctor may order a device called an Incentive Spirometer to help you take deep breaths. When coughing or sneezing, hold a pillow firmly against your incision with both hands. This is called "splinting." Doing this helps protect your incision. It also decreases belly discomfort.  If you are being admitted to the hospital overnight, leave your suitcase in the car. After surgery it may be brought to your room.  If you are being discharged the day  of surgery, you will not be allowed to drive home. You will need a responsible adult (18 years or older) to drive you home and stay with you that night.   If you are taking public transportation, you will need to have a responsible adult (18 years or older) with you. Please confirm with your physician that it is acceptable to use public transportation.   Please call the  [facility] Dept. at [phone] if you have any questions about these instructions.  Surgery Visitation Policy:  Patients undergoing a surgery or procedure may have two family members or support persons with them as long as the person is not COVID-19 positive or experiencing its symptoms.   How to Use an Incentive Spirometer An incentive spirometer is a tool that measures how well you are filling your lungs with each breath. Learning to take long, deep breaths using this tool can help you keep your lungs clear and active. This may help to reverse or lessen your chance of developing breathing (pulmonary) problems, especially infection. You may be asked to use a spirometer: After a surgery. If you have a lung problem or a history of smoking. After a long period of time when you have been unable to move or be active. If the spirometer includes an indicator to show the highest number that you have reached, your health care provider or respiratory therapist will help you set a goal. Keep a log of your progress as told by your health care provider. What are the risks? Breathing too quickly may cause dizziness or cause you to pass out. Take your time so you do not get dizzy or light-headed. If you are in pain, you may need to take pain medicine before doing incentive spirometry. It is harder to take a deep breath if you are having pain. How to use your incentive spirometer  Sit up on the edge of your bed or on a chair. Hold the incentive spirometer so that it is in an upright position. Before you use the spirometer, breathe out normally. Place the mouthpiece in your mouth. Make sure your lips are closed tightly around it. Breathe in slowly and as deeply as you can through your mouth, causing the piston or the ball to rise toward the top of the chamber. Hold your breath for 3-5 seconds, or for as long as possible. If the spirometer includes a coach indicator, use this to guide you in  breathing. Slow down your breathing if the indicator goes above the marked areas. Remove the mouthpiece from your mouth and breathe out normally. The piston or ball will return to the bottom of the chamber. Rest for a few seconds, then repeat the steps 10 or more times. Take your time and take a few normal breaths between deep breaths so that you do not get dizzy or light-headed. Do this every 1-2 hours when you are awake. If the spirometer includes a goal marker to show the highest number you have reached (best effort), use this as a goal to work toward during each repetition. After each set of 10 deep breaths, cough a few times. This will help to make sure that your lungs are clear. If you have an incision on your chest or abdomen from surgery, place a pillow or a rolled-up towel firmly against the incision when you cough. This can help to reduce pain while taking deep breaths and coughing. General tips When you are able to get out of bed: Walk around often.  Continue to take deep breaths and cough in order to clear your lungs. Keep using the incentive spirometer until your health care provider says it is okay to stop using it. If you have been in the hospital, you may be told to keep using the spirometer at home. Contact a health care provider if: You are having difficulty using the spirometer. You have trouble using the spirometer as often as instructed. Your pain medicine is not giving enough relief for you to use the spirometer as told. You have a fever. Get help right away if: You develop shortness of breath. You develop a cough with bloody mucus from the lungs. You have fluid or blood coming from an incision site after you cough. Summary An incentive spirometer is a tool that can help you learn to take long, deep breaths to keep your lungs clear and active. You may be asked to use a spirometer after a surgery, if you have a lung problem or a history of smoking, or if you have been  inactive for a long period of time. Use your incentive spirometer as instructed every 1-2 hours while you are awake. If you have an incision on your chest or abdomen, place a pillow or a rolled-up towel firmly against your incision when you cough. This will help to reduce pain. Get help right away if you have shortness of breath, you cough up bloody mucus, or blood comes from your incision when you cough. This information is not intended to replace advice given to you by your health care provider. Make sure you discuss any questions you have with your health care provider. Document Revised: 06/02/2019 Document Reviewed: 06/02/2019 Elsevier Patient Education  [place].

## 2021-12-01 ENCOUNTER — Encounter
Admission: RE | Admit: 2021-12-01 | Discharge: 2021-12-01 | Disposition: A | Discharge status: Home / Self Care | Payer: Medicare HMO | Source: Ambulatory Visit | Attending: Orthopedic Surgery | Admitting: Orthopedic Surgery

## 2021-12-01 DIAGNOSIS — E1169 Type 2 diabetes mellitus with other specified complication: Secondary | ICD-10-CM | POA: Diagnosis not present

## 2021-12-01 DIAGNOSIS — Z789 Other specified health status: Secondary | ICD-10-CM

## 2021-12-01 DIAGNOSIS — E785 Hyperlipidemia, unspecified: Secondary | ICD-10-CM | POA: Diagnosis not present

## 2021-12-01 DIAGNOSIS — R8290 Unspecified abnormal findings in urine: Secondary | ICD-10-CM

## 2021-12-01 DIAGNOSIS — E119 Type 2 diabetes mellitus without complications: Secondary | ICD-10-CM

## 2021-12-01 DIAGNOSIS — Z01812 Encounter for preprocedural laboratory examination: Secondary | ICD-10-CM

## 2021-12-01 DIAGNOSIS — Z01818 Encounter for other preprocedural examination: Secondary | ICD-10-CM

## 2021-12-01 DIAGNOSIS — Z01810 Encounter for preprocedural cardiovascular examination: Secondary | ICD-10-CM | POA: Diagnosis not present

## 2021-12-01 LAB — BASIC METABOLIC PANEL
Anion gap: 7 (ref 5–15)
BUN: 14 mg/dL (ref 8–23)
CO2: 27 mmol/L (ref 22–32)
Calcium: 9.6 mg/dL (ref 8.9–10.3)
Chloride: 107 mmol/L (ref 98–111)
Creatinine, Ser: 0.81 mg/dL (ref 0.44–1.00)
GFR, Estimated: >60 mL/min (ref >60)
Glucose, Bld: 84 mg/dL (ref 70–99)
Potassium: 3.3 mmol/L — ABNORMAL LOW (ref 3.5–5.1)
Sodium: 141 mmol/L (ref 135–145)

## 2021-12-01 LAB — URINALYSIS, ROUTINE W REFLEX MICROSCOPIC
Bilirubin Urine: NEGATIVE
Glucose, UA: NEGATIVE mg/dL
Hgb urine dipstick: NEGATIVE
Ketones, ur: NEGATIVE mg/dL
Leukocytes,Ua: NEGATIVE
Nitrite: POSITIVE — AB
Protein, ur: NEGATIVE mg/dL
Specific Gravity, Urine: 1.009 (ref 1.005–1.030)
pH: 6 (ref 5.0–8.0)

## 2021-12-01 LAB — CBC
HCT: 40.1 % (ref 36.0–46.0)
Hemoglobin: 13.2 g/dL (ref 12.0–15.0)
MCH: 29.6 pg (ref 26.0–34.0)
MCHC: 32.9 g/dL (ref 30.0–36.0)
MCV: 89.9 fL (ref 80.0–100.0)
Platelets: 188 10*3/uL (ref 150–400)
RBC: 4.46 MIL/uL (ref 3.87–5.11)
RDW: 13.1 % (ref 11.5–15.5)
WBC: 8.8 10*3/uL (ref 4.0–10.5)
nRBC: 0 % (ref 0.0–0.2)

## 2021-12-01 NOTE — Progress Notes (Signed)
  Perioperative Services Pre-Admission/Anesthesia Testing    Date: 12/01/21  Name: Jean Gonzales MRN:   [id number]  Re: GLP-1 clearance and provider recommendations   Planned Surgical Procedure(s):    Case: [id number] Date/Time: 12/06/21 0715   Procedure: KNEE ARTHROSCOPY WITH MEDIAL MENISECTOMY (Right: Knee)   Anesthesia type: General   Pre-op diagnosis: S83.206A Unsp tear of unsp meniscus, current injury, right knee, init   Location: [facility] OR ROOM 05 / [facility]   Surgeons: Lovell Sheehan, MD   Clinical Notes:  Patient is scheduled for the above procedure on 12/06/2021 with Dr. Kurtis Bushman, MD. In review of her medication reconciliation it was noted that patient is on a prescribed GLP-1 medication. Per guidelines issued by the American Society of Anesthesiologists (ASA), it is recommended that these medications be held for 7 days prior to the patient undergoing any type of elective surgical procedure. The patient is taking the following GLP-1 medication:  '[x]'$  SEMAGLUTIDE (Ozempic, Rybelsus)  '[]'$  EXENATIDE (Bydureon, Byetta) '[]'$  LIRAGLUTIDE (Victoza, Saxenda)  '[]'$  LIXISENATIDE (Adalyxin) '[]'$  DULAGLUTIDE (Trulicity)    '[]'$  OTHER GLP-1 medication: _______________  Jeralene Huff out to prescribing provider Rollene Rotunda, FNP-BC) to make them aware of the guidelines from anesthesia. Given that this patient takes the prescribed GLP-1 medication for her  diabetes diagnosis, rather than for weight loss, recommendations from the prescribing provider were solicited. Prescribing provider made aware of the following so that informed decision/POC can be developed for this patient that may be taking medications belonging to these drug classes:  Oral GLP-1 medications will be held 1 day prior to surgery.  Injectable GLP-1 medications will be held 7 days prior to surgery.  Metformin is routinely held 48 hours prior to surgery due to renal concerns, potential need for contrasted imaging  perioperatively, and the potential for tissue hypoxia leading to drug induced lactic acidosis.  All SGLT2i medications are held 72 hours prior to surgery as they can be associated with the increased potential for developing euglycemic diabetic ketoacidosis (EDKA).   Impression and Plan:  Jean Gonzales is on a prescribed GLP-1 medication, which induces the known side effect of decreased gastric emptying. Efforts are bring made to mitigate the risk of perioperative hyperglycemic events, as elevated blood glucose levels have been found to contribute to intra/postoperative complications. Additionally, hyperglycemic extremes can potentially necessitate the postponing of a patient's elective case in order to better optimize perioperative glycemic control, again with the aforementioned guidelines in place. With this in mind, recommendations have been sought from the prescribing provider, who has cleared patient to proceed with holding the prescribed GLP-1 as per the guidelines from the ASA.   Provider recommending: no further recommendations received from the prescribing provider.  Copy of signed clearance and recommendations placed on patient's chart for inclusion in their medical record and for review by the surgical/anesthetic team on the day of her procedure.   Honor Loh, MSN, APRN, FNP-C, CEN [facility]  Peri-operative Services Nurse Practitioner Phone: [phone] 12/01/21 10:08 AM  NOTE: This note has been prepared using Dragon dictation software. Despite my best ability to proofread, there is always the potential that unintentional transcriptional errors may still occur from this process.

## 2021-12-03 ENCOUNTER — Telehealth: Payer: Self-pay | Admitting: Urgent Care

## 2021-12-03 ENCOUNTER — Encounter: Payer: Self-pay | Admitting: Orthopedic Surgery

## 2021-12-03 DIAGNOSIS — Z01812 Encounter for preprocedural laboratory examination: Secondary | ICD-10-CM

## 2021-12-03 DIAGNOSIS — B9689 Other specified bacterial agents as the cause of diseases classified elsewhere: Secondary | ICD-10-CM

## 2021-12-03 DIAGNOSIS — Z87440 Personal history of urinary (tract) infections: Secondary | ICD-10-CM

## 2021-12-03 DIAGNOSIS — Z789 Other specified health status: Secondary | ICD-10-CM

## 2021-12-03 MED ORDER — CIPROFLOXACIN HCL 500 MG PO TABS: 500.0000 mg | ORAL_TABLET | Freq: Two times a day (BID) | ORAL | 0 refills | Status: AC

## 2021-12-03 NOTE — Progress Notes (Signed)
  Perioperative Services Pre-Admission/Anesthesia Testing    Date: 12/03/21  Name: Toy Samarin MRN:   [id number]  Re: Klebsiella UTI and plans for preoperative treatment  Clinical Notes:  Patient is scheduled for elective knee surgery on 12/06/2021 with Dr. Kurtis Bushman, MD. Preoperative urine showed (+) Klebsiella pneumoniae.  Patient with recurrent UTI due to self catheterization techniques and frequency of catheterizations. She is followed by urology.   Patient answered message in MyChart to indicate that she is not having any significant symptoms at this time. Patient is on suppressive antibiotics and is taking the previously mentioned [place] (OTC). Discussed previous urine that grew out Escherichia coli in June. Patient made aware that this is a new pathogen and that efforts needed to be made to treat prior to upcoming surgery due to potential for associated complications in the setting of an elective knee procedure. Given that patient is not having significant symptoms, coupled with her suppressive antibiotic administration and the fact that she will receive preoperative cefazolin, I will reduce the course of treatment down to 3 days. Sending prescription to pharmacy as follows:  Meds ordered this encounter  Medications   ciprofloxacin (CIPRO) 500 MG tablet    Sig: Take 1 tablet (500 mg total) by mouth 2 (two) times daily for 3 days. Increase WATER intake while on this medication.    Dispense:  6 tablet    Refill:  0   Patient encouraged to fill Rx today and get medication started. Additionally, she was advised to follow up with Dr. Harlow Mares' office on Monday to ensure that surgery was still scheduled to proceed as planned. I discussed that MD may elect, out of an abundance of caution, to postopone her procedure until antimicrobial course completed. May need an extension of therapy, however I will defer this to Dr. Harlow Mares. She was encouraged to increase water intake while on the  antibiotics and to call surgeon's office with any further questions or concerns.   Encounter Diagnoses  Name Primary?   Pre-operative laboratory examination Yes   UTI due to Klebsiella species    History of recurrent UTIs    Self-catheterizes urinary bladder    Honor Loh, MSN, APRN, FNP-C, CEN [facility]  Peri-operative Services Nurse Practitioner Phone: [phone] 12/03/21 6:29 PM  NOTE: This note has been prepared using Dragon dictation software. Despite my best ability to proofread, there is always the potential that unintentional transcriptional errors may still occur from this process.

## 2021-12-03 NOTE — Progress Notes (Signed)
Perioperative Services Pre-Admission/Anesthesia Testing    Date: 12/03/21  Name: Jean Gonzales MRN:   [id number]  Re: Preoperative urine results   Planned Surgical Procedure(s):  Procedure: KNEE ARTHROSCOPY WITH MEDIAL MENISECTOMY  Date of Surgery: 12/06/2021 Surgeon: Dr. Kurtis Bushman, MD  Clinical Notes:  Patient is scheduled for the above procedure on 12/06/2021 with Dr. Kurtis Bushman, MD. In preparation for her procedure, patient presented to the PAT clinic for preoperative labs and ECG. Patient unable to provide urine sample citing the fact that she has to self catheterize herself due to stricture of her urinary meatus caused by XRT received in the treatment of her endometrial cancer. Patient had to return sample to clinic after self catheterizing herself at home.   Patient has a history of recurrent urinary tract infections related to her self catheterization technique and frequency of catheterization. She was treated towards the end of June this year for an Escherichia coli infection. Of note, patient is under the care of urology and is supposed to be on suppressive daily cephalexin. MD noted that suppressive issues were limited in this patient, as she has been on multiple suppressive interventions in the past. With that being said, patient eluded to the fact during her PAT interview with the nurse that she was not taking the prescribed suppressive therapy, rather she was using and OTC intervention Jonette Eva). She indicated that this intervention seemed to be doing well for her and that she preferred it to the prescribed antibiotics.   Preoperative UA concerning for recurrent urinary tract infection. Subsequent culture was sent that grew out pathogenically significant GNR colony count; pathogen identified as Klebsiella pneumoniae. When results were reviewed, I attempted to reach out to patient via phone and MyChart to discuss results, presence of symptoms, and to determine exactly what  patient was utilizing on a daily basis. To date (12/03/2021 at 1130 am), patient has not interacted with MyChart communication, nor returned calls. Myself, and PAT staff, attempted several times to contact patient via phone yesterday (09/08/20203). I have tried several times today (12/03/2021), however no response. Detailed messages have been left on her voicemail.   Impression and Plan:  Jean Gonzales has been found to have significant Klebsiella pneumoniae in her urine. I have been unable to contact her regarding treatment. At this point, I am not sure that we have adequate time to get patient sufficiently treated prior to her upcoming elective orthopedic procedure on 12/06/2021. I have sent communication to Dr. Harlow Mares, MD and Carlynn Spry, PA-C making them aware of the above.   Surgical POC is unclear at this point. Advised patient during my last phone message to her that she will need to contact Dr. Harlow Mares' office on Monday to discuss whether or not case will proceed as planned or if it will be postponed pending efforts to sterilize her urine. Patient aware that decision will come from her surgeon as to whether or not case will proceed.   If she happens to respond to my messages this weekend, I will plan on getting her started on treatment for presumed urinary tract infection, otherwise she will follow up with surgeon next week to discuss. Any treatment measures will be documented in her EMR and a formally documented POC will be forwarded to her surgeon.   Honor Loh, MSN, APRN, FNP-C, CEN [facility]  Peri-operative Services Nurse Practitioner Phone: [phone] 12/03/21 11:43 AM  NOTE: This note has been prepared using Dragon dictation software. Despite my best ability  to proofread, there is always the potential that unintentional transcriptional errors may still occur from this process.

## 2021-12-04 LAB — URINE CULTURE: Culture: >=100000 — AB

## 2021-12-05 MED ORDER — CHLORHEXIDINE GLUCONATE 0.12 % MT SOLN
15.0000 mL | Freq: Once | OROMUCOSAL | Status: AC
  Administered 2021-12-06: 15 mL via OROMUCOSAL

## 2021-12-05 MED ORDER — CEFAZOLIN SODIUM-DEXTROSE 2-4 GM/100ML-% IV SOLN
2.0000 g | INTRAVENOUS | Status: AC
  Administered 2021-12-06: 2 g via INTRAVENOUS

## 2021-12-05 MED ORDER — ORAL CARE MOUTH RINSE: 15.0000 mL | Freq: Once | OROMUCOSAL | Status: AC

## 2021-12-05 MED ORDER — LACTATED RINGERS IV SOLN: INTRAVENOUS | Status: DC

## 2021-12-05 MED ORDER — SODIUM CHLORIDE 0.9 % IV SOLN: INTRAVENOUS | Status: DC

## 2021-12-06 ENCOUNTER — Ambulatory Visit: Payer: Medicare HMO | Admitting: Urgent Care

## 2021-12-06 ENCOUNTER — Other Ambulatory Visit: Payer: Self-pay

## 2021-12-06 ENCOUNTER — Encounter
Admission: RE | Disposition: A | Discharge status: Home / Self Care | Payer: Self-pay | Source: Home / Self Care | Attending: Orthopedic Surgery

## 2021-12-06 ENCOUNTER — Encounter: Payer: Self-pay | Admitting: Orthopedic Surgery

## 2021-12-06 ENCOUNTER — Ambulatory Visit
Admission: RE | Admit: 2021-12-06 | Discharge: 2021-12-06 | Disposition: A | Discharge status: Home / Self Care | Payer: Medicare HMO | Attending: Orthopedic Surgery | Admitting: Orthopedic Surgery

## 2021-12-06 DIAGNOSIS — K219 Gastro-esophageal reflux disease without esophagitis: Secondary | ICD-10-CM | POA: Diagnosis not present

## 2021-12-06 DIAGNOSIS — S83232A Complex tear of medial meniscus, current injury, left knee, initial encounter: Secondary | ICD-10-CM | POA: Diagnosis not present

## 2021-12-06 DIAGNOSIS — S83206A Unspecified tear of unspecified meniscus, current injury, right knee, initial encounter: Secondary | ICD-10-CM | POA: Diagnosis not present

## 2021-12-06 DIAGNOSIS — S83282A Other tear of lateral meniscus, current injury, left knee, initial encounter: Secondary | ICD-10-CM | POA: Diagnosis not present

## 2021-12-06 DIAGNOSIS — X58XXXA Exposure to other specified factors, initial encounter: Secondary | ICD-10-CM | POA: Diagnosis not present

## 2021-12-06 DIAGNOSIS — M94262 Chondromalacia, left knee: Secondary | ICD-10-CM | POA: Diagnosis not present

## 2021-12-06 DIAGNOSIS — Z7985 Long-term (current) use of injectable non-insulin antidiabetic drugs: Secondary | ICD-10-CM | POA: Diagnosis not present

## 2021-12-06 DIAGNOSIS — M459 Ankylosing spondylitis of unspecified sites in spine: Secondary | ICD-10-CM | POA: Insufficient documentation

## 2021-12-06 DIAGNOSIS — M65862 Other synovitis and tenosynovitis, left lower leg: Secondary | ICD-10-CM | POA: Diagnosis not present

## 2021-12-06 DIAGNOSIS — I10 Essential (primary) hypertension: Secondary | ICD-10-CM | POA: Diagnosis not present

## 2021-12-06 DIAGNOSIS — M2242 Chondromalacia patellae, left knee: Secondary | ICD-10-CM | POA: Diagnosis not present

## 2021-12-06 DIAGNOSIS — E119 Type 2 diabetes mellitus without complications: Secondary | ICD-10-CM | POA: Insufficient documentation

## 2021-12-06 DIAGNOSIS — S83242A Other tear of medial meniscus, current injury, left knee, initial encounter: Secondary | ICD-10-CM | POA: Diagnosis present

## 2021-12-06 DIAGNOSIS — Z01810 Encounter for preprocedural cardiovascular examination: Secondary | ICD-10-CM | POA: Diagnosis not present

## 2021-12-06 DIAGNOSIS — M2342 Loose body in knee, left knee: Secondary | ICD-10-CM | POA: Diagnosis not present

## 2021-12-06 DIAGNOSIS — Z01818 Encounter for other preprocedural examination: Secondary | ICD-10-CM

## 2021-12-06 DIAGNOSIS — T7840XA Allergy, unspecified, initial encounter: Secondary | ICD-10-CM | POA: Diagnosis not present

## 2021-12-06 HISTORY — PX: KNEE ARTHROSCOPY WITH MEDIAL MENISECTOMY: SHX5651

## 2021-12-06 HISTORY — DX: Personal history of urinary (tract) infections: Z87.440

## 2021-12-06 HISTORY — DX: Type 2 diabetes mellitus without complications: E11.9

## 2021-12-06 LAB — GLUCOSE, CAPILLARY
Glucose-Capillary: 110 mg/dL — ABNORMAL HIGH (ref 70–99)
Glucose-Capillary: 129 mg/dL — ABNORMAL HIGH (ref 70–99)

## 2021-12-06 SURGERY — ARTHROSCOPY, KNEE, WITH MEDIAL MENISCECTOMY
Anesthesia: General | Site: Knee | Laterality: Left

## 2021-12-06 MED ORDER — BUPIVACAINE-EPINEPHRINE (PF) 0.25% -1:200000 IJ SOLN
INTRAMUSCULAR | Status: AC
  Filled 2021-12-06: qty 30

## 2021-12-06 MED ORDER — ONDANSETRON HCL 4 MG PO TABS: 4.0000 mg | ORAL_TABLET | Freq: Four times a day (QID) | ORAL | Status: DC | PRN

## 2021-12-06 MED ORDER — HYDROCODONE-ACETAMINOPHEN 7.5-325 MG PO TABS: 1.0000 | ORAL_TABLET | ORAL | Status: DC | PRN

## 2021-12-06 MED ORDER — HYDROCODONE-ACETAMINOPHEN 5-325 MG PO TABS: 1.0000 | ORAL_TABLET | ORAL | Status: DC | PRN

## 2021-12-06 MED ORDER — MIDAZOLAM HCL 2 MG/2ML IJ SOLN
INTRAMUSCULAR | Status: AC
  Filled 2021-12-06: qty 2

## 2021-12-06 MED ORDER — ACETAMINOPHEN 10 MG/ML IV SOLN: 1000.0000 mg | Freq: Once | INTRAVENOUS | Status: DC | PRN

## 2021-12-06 MED ORDER — MIDAZOLAM HCL 2 MG/2ML IJ SOLN
INTRAMUSCULAR | Status: DC | PRN
  Administered 2021-12-06: 2 mg via INTRAVENOUS

## 2021-12-06 MED ORDER — ACETAMINOPHEN 10 MG/ML IV SOLN
INTRAVENOUS | Status: AC
  Filled 2021-12-06: qty 100

## 2021-12-06 MED ORDER — ROPIVACAINE HCL 5 MG/ML IJ SOLN
INTRAMUSCULAR | Status: AC
  Filled 2021-12-06: qty 20

## 2021-12-06 MED ORDER — ROPIVACAINE HCL 5 MG/ML IJ SOLN: INTRAMUSCULAR | Status: DC | PRN

## 2021-12-06 MED ORDER — ONDANSETRON HCL 4 MG/2ML IJ SOLN
INTRAMUSCULAR | Status: AC
  Filled 2021-12-06: qty 2

## 2021-12-06 MED ORDER — FENTANYL CITRATE (PF) 100 MCG/2ML IJ SOLN
INTRAMUSCULAR | Status: AC
  Filled 2021-12-06: qty 2

## 2021-12-06 MED ORDER — ONDANSETRON HCL 4 MG/2ML IJ SOLN: 4.0000 mg | Freq: Four times a day (QID) | INTRAMUSCULAR | Status: DC | PRN

## 2021-12-06 MED ORDER — BUPIVACAINE-EPINEPHRINE (PF) 0.25% -1:200000 IJ SOLN
INTRAMUSCULAR | Status: DC | PRN
  Administered 2021-12-06: 15 mL

## 2021-12-06 MED ORDER — FENTANYL CITRATE (PF) 100 MCG/2ML IJ SOLN
25.0000 ug | INTRAMUSCULAR | Status: DC | PRN
  Administered 2021-12-06 (×3): 50 ug via INTRAVENOUS

## 2021-12-06 MED ORDER — PHENYLEPHRINE 80 MCG/ML (10ML) SYRINGE FOR IV PUSH (FOR BLOOD PRESSURE SUPPORT)
PREFILLED_SYRINGE | INTRAVENOUS | Status: DC | PRN
  Administered 2021-12-06 (×3): 80 ug via INTRAVENOUS

## 2021-12-06 MED ORDER — METOCLOPRAMIDE HCL 10 MG PO TABS: 5.0000 mg | ORAL_TABLET | Freq: Three times a day (TID) | ORAL | Status: DC | PRN

## 2021-12-06 MED ORDER — OXYCODONE HCL 5 MG PO TABS
5.0000 mg | ORAL_TABLET | Freq: Once | ORAL | Status: AC | PRN
  Administered 2021-12-06: 5 mg via ORAL

## 2021-12-06 MED ORDER — PHENYLEPHRINE HCL-NACL 20-0.9 MG/250ML-% IV SOLN
INTRAVENOUS | Status: AC
  Filled 2021-12-06: qty 250

## 2021-12-06 MED ORDER — OXYCODONE HCL 5 MG PO TABS
ORAL_TABLET | ORAL | Status: AC
  Filled 2021-12-06: qty 1

## 2021-12-06 MED ORDER — CHLORHEXIDINE GLUCONATE 0.12 % MT SOLN
OROMUCOSAL | Status: AC
  Filled 2021-12-06: qty 15

## 2021-12-06 MED ORDER — PROPOFOL 10 MG/ML IV BOLUS
INTRAVENOUS | Status: AC
  Filled 2021-12-06: qty 40

## 2021-12-06 MED ORDER — LIDOCAINE 2% (20 MG/ML) 5 ML SYRINGE
INTRAMUSCULAR | Status: DC | PRN
  Administered 2021-12-06: 60 mg via INTRAVENOUS

## 2021-12-06 MED ORDER — METOCLOPRAMIDE HCL 5 MG/ML IJ SOLN: 5.0000 mg | Freq: Three times a day (TID) | INTRAMUSCULAR | Status: DC | PRN

## 2021-12-06 MED ORDER — ACETAMINOPHEN 325 MG PO TABS: 325.0000 mg | ORAL_TABLET | Freq: Four times a day (QID) | ORAL | Status: DC | PRN

## 2021-12-06 MED ORDER — DEXMEDETOMIDINE (PRECEDEX) IN NS 20 MCG/5ML (4 MCG/ML) IV SYRINGE
PREFILLED_SYRINGE | INTRAVENOUS | Status: DC | PRN
  Administered 2021-12-06: 4 ug via INTRAVENOUS
  Administered 2021-12-06: 8 ug via INTRAVENOUS

## 2021-12-06 MED ORDER — ONDANSETRON HCL 4 MG/2ML IJ SOLN: 4.0000 mg | Freq: Once | INTRAMUSCULAR | Status: DC | PRN

## 2021-12-06 MED ORDER — LIDOCAINE HCL (PF) 2 % IJ SOLN
INTRAMUSCULAR | Status: AC
  Filled 2021-12-06: qty 5

## 2021-12-06 MED ORDER — LACTATED RINGERS IV SOLN: INTRAVENOUS | Status: DC

## 2021-12-06 MED ORDER — EPINEPHRINE PF 1 MG/ML IJ SOLN
INTRAMUSCULAR | Status: AC
  Filled 2021-12-06: qty 4

## 2021-12-06 MED ORDER — HYDROMORPHONE HCL 1 MG/ML IJ SOLN
INTRAMUSCULAR | Status: DC | PRN
  Administered 2021-12-06: .5 mg via INTRAVENOUS
  Administered 2021-12-06 (×2): .25 mg via INTRAVENOUS

## 2021-12-06 MED ORDER — FENTANYL CITRATE (PF) 100 MCG/2ML IJ SOLN
INTRAMUSCULAR | Status: DC | PRN
  Administered 2021-12-06 (×2): 50 ug via INTRAVENOUS

## 2021-12-06 MED ORDER — DOCUSATE SODIUM 100 MG PO CAPS: 100.0000 mg | ORAL_CAPSULE | Freq: Every day | ORAL | 2 refills | Status: DC | PRN

## 2021-12-06 MED ORDER — DEXAMETHASONE SODIUM PHOSPHATE 10 MG/ML IJ SOLN
INTRAMUSCULAR | Status: AC
  Filled 2021-12-06: qty 1

## 2021-12-06 MED ORDER — EPHEDRINE SULFATE-NACL 50-0.9 MG/10ML-% IV SOSY
PREFILLED_SYRINGE | INTRAVENOUS | Status: DC | PRN
  Administered 2021-12-06: 5 mg via INTRAVENOUS

## 2021-12-06 MED ORDER — CEFAZOLIN SODIUM-DEXTROSE 2-4 GM/100ML-% IV SOLN
INTRAVENOUS | Status: AC
  Filled 2021-12-06: qty 100

## 2021-12-06 MED ORDER — KETOROLAC TROMETHAMINE 15 MG/ML IJ SOLN: 7.5000 mg | Freq: Four times a day (QID) | INTRAMUSCULAR | Status: DC

## 2021-12-06 MED ORDER — HYDROCODONE-ACETAMINOPHEN 5-325 MG PO TABS: 1.0000 | ORAL_TABLET | ORAL | 0 refills | Status: DC | PRN

## 2021-12-06 MED ORDER — METHYLPREDNISOLONE ACETATE 40 MG/ML IJ SUSP
INTRAMUSCULAR | Status: AC
  Filled 2021-12-06: qty 1

## 2021-12-06 MED ORDER — OXYCODONE HCL 5 MG/5ML PO SOLN: 5.0000 mg | Freq: Once | ORAL | Status: AC | PRN

## 2021-12-06 MED ORDER — PROPOFOL 10 MG/ML IV BOLUS
INTRAVENOUS | Status: DC | PRN
  Administered 2021-12-06: 40 mg via INTRAVENOUS
  Administered 2021-12-06 (×2): 30 mg via INTRAVENOUS
  Administered 2021-12-06: 170 mg via INTRAVENOUS

## 2021-12-06 MED ORDER — LACTATED RINGERS IR SOLN
Status: DC | PRN
  Administered 2021-12-06 (×4): 3001 mL

## 2021-12-06 MED ORDER — ONDANSETRON HCL 4 MG/2ML IJ SOLN
INTRAMUSCULAR | Status: DC | PRN
  Administered 2021-12-06: 4 mg via INTRAVENOUS

## 2021-12-06 MED ORDER — ACETAMINOPHEN 10 MG/ML IV SOLN
INTRAVENOUS | Status: DC | PRN
  Administered 2021-12-06: 1000 mg via INTRAVENOUS

## 2021-12-06 MED ORDER — DEXAMETHASONE SODIUM PHOSPHATE 10 MG/ML IJ SOLN
INTRAMUSCULAR | Status: DC | PRN
  Administered 2021-12-06: 5 mg via INTRAVENOUS

## 2021-12-06 MED ORDER — HYDROMORPHONE HCL 1 MG/ML IJ SOLN
INTRAMUSCULAR | Status: AC
  Filled 2021-12-06: qty 1

## 2021-12-06 MED ORDER — MORPHINE SULFATE (PF) 2 MG/ML IV SOLN: 0.5000 mg | INTRAVENOUS | Status: DC | PRN

## 2021-12-06 MED ORDER — PHENYLEPHRINE 80 MCG/ML (10ML) SYRINGE FOR IV PUSH (FOR BLOOD PRESSURE SUPPORT)
PREFILLED_SYRINGE | INTRAVENOUS | Status: AC
  Filled 2021-12-06: qty 10

## 2021-12-06 MED ORDER — EPHEDRINE 5 MG/ML INJ
INTRAVENOUS | Status: AC
  Filled 2021-12-06: qty 5

## 2021-12-06 SURGICAL SUPPLY — 42 items
ADAPTER IRRIG TUBE 2 SPIKE SOL (ADAPTER) ×2 IMPLANT
ADPR TBG 2 SPK PMP STRL ASCP (ADAPTER) ×2
APL PRP STRL LF DISP 70% ISPRP (MISCELLANEOUS) ×1
BLADE FULL RADIUS 3.5 (BLADE) IMPLANT
BLADE INCISOR PLUS 4.5 (BLADE) ×1 IMPLANT
BLADE SHAVER 4.5 DBL SERAT CV (CUTTER) IMPLANT
BLADE SURG SZ11 CARB STEEL (BLADE) ×1 IMPLANT
BRUSH SCRUB EZ  4% CHG (MISCELLANEOUS) ×2
BRUSH SCRUB EZ 4% CHG (MISCELLANEOUS) ×2 IMPLANT
CHLORAPREP W/TINT 26 (MISCELLANEOUS) ×1 IMPLANT
COOLER POLAR GLACIER W/PUMP (MISCELLANEOUS) ×1 IMPLANT
DRAPE ARTHRO LIMB 89X125 STRL (DRAPES) ×1 IMPLANT
DRAPE ORTHO SPLIT 77X108 STRL (DRAPES) ×1
DRAPE SURG ORHT 6 SPLT 77X108 (DRAPES) ×1 IMPLANT
GAUZE SPONGE 4X4 12PLY STRL (GAUZE/BANDAGES/DRESSINGS) ×1 IMPLANT
GAUZE XEROFORM 1X8 LF (GAUZE/BANDAGES/DRESSINGS) ×1 IMPLANT
GLOVE BIOGEL PI IND STRL 8.5 (GLOVE) ×1 IMPLANT
GLOVE SURG ORTHO 8.5 STRL (GLOVE) ×1 IMPLANT
GOWN STRL REUS W/ TWL LRG LVL3 (GOWN DISPOSABLE) ×1 IMPLANT
GOWN STRL REUS W/ TWL XL LVL3 (GOWN DISPOSABLE) ×1 IMPLANT
GOWN STRL REUS W/TWL LRG LVL3 (GOWN DISPOSABLE) ×1
GOWN STRL REUS W/TWL XL LVL3 (GOWN DISPOSABLE) ×1
IV LACTATED RINGER IRRG 3000ML (IV SOLUTION) ×4
IV LR IRRIG 3000ML ARTHROMATIC (IV SOLUTION) ×4 IMPLANT
KIT TURNOVER KIT A (KITS) ×1 IMPLANT
MANIFOLD NEPTUNE II (INSTRUMENTS) ×2 IMPLANT
MAT ABSORB  FLUID 56X50 GRAY (MISCELLANEOUS) ×1
MAT ABSORB FLUID 56X50 GRAY (MISCELLANEOUS) ×1 IMPLANT
NDL SPNL 20GX3.5 QUINCKE YW (NEEDLE) ×1 IMPLANT
NEEDLE SPNL 20GX3.5 QUINCKE YW (NEEDLE) ×1 IMPLANT
PACK ARTHROSCOPY KNEE (MISCELLANEOUS) ×1 IMPLANT
PAD ABD DERMACEA PRESS 5X9 (GAUZE/BANDAGES/DRESSINGS) ×2 IMPLANT
PAD WRAPON POLAR KNEE (MISCELLANEOUS) ×1 IMPLANT
SPONGE T-LAP 18X18 ~~LOC~~+RFID (SPONGE) ×1 IMPLANT
SUT ETHILON 4-0 (SUTURE) ×1
SUT ETHILON 4-0 FS2 18XMFL BLK (SUTURE) ×1
SUTURE ETHLN 4-0 FS2 18XMF BLK (SUTURE) ×1 IMPLANT
TRAP FLUID SMOKE EVACUATOR (MISCELLANEOUS) ×1 IMPLANT
TUBING INFLOW SET DBFLO PUMP (TUBING) ×1 IMPLANT
WAND WEREWOLF FLOW 90D (MISCELLANEOUS) ×1 IMPLANT
WATER STERILE IRR 500ML POUR (IV SOLUTION) ×1 IMPLANT
WRAPON POLAR PAD KNEE (MISCELLANEOUS) ×1

## 2021-12-06 NOTE — Discharge Instructions (Addendum)
Post Op Home Instructions for Knee Arthroscopy  PATIENT ALREADY HAS FOLLOW-UP AND PHYSICAL THERAPY SCHEDULED  1) Do not sit for longer than 1 hour at a time with your leg dangling down.  You should have your legs elevated (higher than your heart) in a recliner chair or couch.  2) You may be up walking around as tolerated but should take periodic breaks to elevate your legs.  Discontinue use of crutches when you feel you are able to walk without pain or a limp.  3) Work on gentle bending and straightening of the knee.  4) You may remove the Ace wrap and dressings two days after surgery.  Place band aids over the incision sites.  5) You may shower after you remove the surgical dressing.  You do not need to cover the incision with plastic wrap.  The incision can get wet, but do not submerge under water.  After your sutures have been removed, you should wait 24 hours before submerging incision under water.  6) Pain medication can cause constipation.  You should increase your fluid intake, increase your intake of high fiber foods and/or take Metamucil as needed for constipation.  7) Continue your physical therapy exercises, as shown at the office, at least twice daily.  You should set up outpatient physical therapy and start within the first week after surgery.  8) Continue to use your Polar Pack continuously for 2-3 days after surgery.  After you remove the surgical dressing, it is a good idea to use your Polar Pack or ice pack for 30 minutes after doing your exercises to reduce swelling.  9) Do not be surprised if you have increased pain at night.  This usually means you have been a little too active during the day and need to reduce your activities.  10) If you develop lower extremity swelling that does not improve after a night of elevation, please call the office.  This could be an early sign of a blood clot.  Please call with any questions at [phone]    AMBULATORY SURGERY   DISCHARGE INSTRUCTIONS   The drugs that you were given will stay in your system until tomorrow so for the next 24 hours you should not:  Drive an automobile Make any legal decisions Drink any alcoholic beverage   You may resume regular meals tomorrow.  Today it is better to start with liquids and gradually work up to solid foods.  You may eat anything you prefer, but it is better to start with liquids, then soup and crackers, and gradually work up to solid foods.   Please notify your doctor immediately if you have any unusual bleeding, trouble breathing, redness and pain at the surgery site, drainage, fever, or pain not relieved by medication.     Additional Instructions:

## 2021-12-06 NOTE — H&P (Signed)
The patient has been re-examined, and the chart reviewed, and there have been no interval changes to the documented history and physical.  Plan a left knee arthroscopy today.  Anesthesia is not consulted regarding a peripheral nerve block for post-operative pain.  The risks, benefits, and alternatives have been discussed at length, and the patient is willing to proceed.    

## 2021-12-06 NOTE — Op Note (Signed)
PATIENT:  Jean Gonzales  PRE-OPERATIVE DIAGNOSIS:  S83.206A Unsp tear of unsp meniscus, current injury, left knee, init  POST-OPERATIVE DIAGNOSIS:  Same  PROCEDURE:  LEFT KNEE ARTHROSCOPY WITH PARTIAL MEDIAL AND LATERAL MENISECTOMY, PARTIAL SYNOVECTOMY, LOOSE BODY REMOVAL  SURGEON:  Kurtis Bushman, MD  ANESTHESIA:   General  PREOPERATIVE INDICATIONS:  Jean Gonzales  66 y.o. female with a diagnosis of S83.206A Unsp tear of unsp meniscus, current injury, left knee, init who failed conservative management and elected for surgical management.    The risks benefits and alternatives were discussed with the patient preoperatively including the risks of infection, bleeding, nerve injury, knee stiffness, persistent pain, osteoarthritis and the need for further surgery. Medical  risks include DVT and pulmonary embolism, myocardial infarction, stroke, pneumonia, respiratory failure and death. The patient understood these risks and wished to proceed.   OPERATIVE FINDINGS: The suprapatellar pouch, medial and lateral gutters were inspected and found to be free of any loose bodies. The undersurface of the patella and landing zone were inspected and grade 2 chondromalacia was noted. There was no evidence of lateral subluxation. The medial compartment showed a complex tear of the posterior horn, then anterior horn was intact and stable to probing. Grade 2 and 3 chondromalacia was identified in the medial compartment.  The notch was inspected and the ACL and PCL were intact and stable to probing. A loose body and bone spur were noted in the notch. The lateral compartment was entered and minimal degenerative changes were identified. The lateral meniscus had significant degenerative tearing along the posterior horn and body. The anterior horn was intact a stable.  OPERATIVE PROCEDURE: Patient was met in the preoperative area. The operative extremity was signed with my initials according the hospital's  correct site of surgery protocol.  The patient was brought to the operating room where they was placed supine on the operative table. General anesthesia was administered. The patient was prepped and draped in a sterile fashion.  A timeout was performed to verify the patient's name, date of birth, medical record number, correct site of surgery correct procedure to be performed. It was also used to verify the patient received antibiotics that all appropriate instruments, and radiographic studies were available in the room. Once all in attendance were in agreement, the case began.  Proposed arthroscopy incisions were drawn out with a surgical marker. These were pre-injected with 0.5% marcaine with epinephrine. An 11 blade was used to establish an inferior lateral and inferomedial portals. The inferomedial portal was created using a 18-gauge spinal needle under direct visualization.  A full diagnostic examination of the knee was performed, please see findings for a complete list of results.  Patient had the meniscal tear treated with a 4-0 resector shaver blade and straight duckbill basket. The meniscus was debrided until a stable rim was achieved. A chondroplasty was performed in all three compartments using the shaver on reverse burr mode. A partial synovectomy was also performed in all three compartments using a 4-0 resector shaver blade and electrocautery.  The loose body was less than 1 cm in diameter and removed with a grasper. The anterior spine spur was also removed with the burr.  The knee was then copiously lavaged. All arthroscopic instruments were removed. The 2 arthroscopy portals were closed with 4-0 nylon. A dry sterile and compressive dressing was applied. The patient was brought to the PACU in stable condition. I was scrubbed and present for the entire case and all sharp and instrument counts  were correct at the conclusion the case. I spoke with the patient's family postoperatively to let  them know the case was performed without complication and the patient was stable in the recovery room.  Kurtis Bushman, MD

## 2021-12-06 NOTE — Anesthesia Preprocedure Evaluation (Addendum)
Anesthesia Evaluation  Patient identified by MRN, date of birth, ID band Patient awake    Reviewed: Allergy & Precautions, NPO status , Patient's Chart, lab work & pertinent test results  History of Anesthesia Complications Negative for: history of anesthetic complications  Airway Mallampati: III   Neck ROM: Full    Dental  (+) Missing   Pulmonary neg pulmonary ROS,    Pulmonary exam normal breath sounds clear to auscultation       Cardiovascular hypertension, Normal cardiovascular exam Rhythm:Regular Rate:Normal  ECG 12/01/21: normal   Neuro/Psych negative neurological ROS     GI/Hepatic GERD  ,  Endo/Other  diabetes, Type 2  Renal/GU negative Renal ROS     Musculoskeletal  (+) Arthritis , Osteoarthritis and Rheumatoid disorders,  Ankylosing spondylitis   Abdominal   Peds  Hematology negative hematology ROS (+)   Anesthesia Other Findings Patient is on ozempic; last dose 11/27/21  Reproductive/Obstetrics                            Anesthesia Physical Anesthesia Plan  ASA: 3  Anesthesia Plan: General   Post-op Pain Management:    Induction: Intravenous  PONV Risk Score and Plan: 3 and Ondansetron, Dexamethasone and Treatment may vary due to age or medical condition  Airway Management Planned: LMA  Additional Equipment:   Intra-op Plan:   Post-operative Plan: Extubation in OR  Informed Consent: I have reviewed the patients History and Physical, chart, labs and discussed the procedure including the risks, benefits and alternatives for the proposed anesthesia with the patient or authorized representative who has indicated his/her understanding and acceptance.     Dental advisory given  Plan Discussed with: CRNA  Anesthesia Plan Comments: (LMA with backup ETT.  Patient consented for risks of anesthesia including but not limited to:  - adverse reactions to medications - damage  to eyes, teeth, lips or other oral mucosa - nerve damage due to positioning  - sore throat or hoarseness - damage to heart, brain, nerves, lungs, other parts of body or loss of life  Informed patient about role of CRNA in peri- and intra-operative care.  Patient voiced understanding.)       Anesthesia Quick Evaluation

## 2021-12-06 NOTE — Progress Notes (Addendum)
Pt keeps rubbing hand over center of chest.  Pt c/o heavy feeling in chest that has been present since she came out of the OR.  Called and informed dr Danne Baxter and she is coming to bedside to assess pt.  Blood sugar checked.  EKG was done and will watch pt while longer per dr Danne Baxter.    1030-heaviness has resolved. Dr Danne Baxter aware and ok for pt to be discharged.  This RN reviewed strict precautions to present to ED r/t chest pain and associated sx.  VSS. Pt and husband verbalized understanding.

## 2021-12-06 NOTE — Anesthesia Procedure Notes (Signed)
Procedure Name: LMA Insertion Date/Time: 12/06/2021 7:40 AM  Performed by: Rande Brunt, CRNAPre-anesthesia Checklist: Patient identified, Patient being monitored, Timeout performed, Emergency Drugs available and Suction available Patient Re-evaluated:Patient Re-evaluated prior to induction Oxygen Delivery Method: Circle system utilized Preoxygenation: Pre-oxygenation with 100% oxygen Induction Type: IV induction Ventilation: Mask ventilation without difficulty LMA: LMA inserted LMA Size: 4.0 Tube type: Oral Number of attempts: 1 Placement Confirmation: positive ETCO2 and breath sounds checked- equal and bilateral Tube secured with: Tape Dental Injury: Teeth and Oropharynx as per pre-operative assessment

## 2021-12-06 NOTE — Transfer of Care (Signed)
Immediate Anesthesia Transfer of Care Note  Patient: Jean Gonzales  Procedure(s) Performed: KNEE ARTHROSCOPY WITH PARTIAL MEDIAL LATERAL MENISECTOMY, PARTIAL SYNOVECTOMY, LOOSE BODY REMOVAL (Left: Knee)  Patient Location: PACU  Anesthesia Type:General  Level of Consciousness: awake, alert  and oriented  Airway & Oxygen Therapy: Patient Spontanous Breathing  Post-op Assessment: Report given to RN and Post -op Vital signs reviewed and stable  Post vital signs: Reviewed and stable  Last Vitals:  Vitals Value Taken Time  BP 107/67 12/06/21 0843  Temp    Pulse 107 12/06/21 0845  Resp 20 12/06/21 0845  SpO2 99 % 12/06/21 0845  Vitals shown include unvalidated device data.  Last Pain:  Vitals:   12/06/21 0649  TempSrc: Temporal         Complications: No notable events documented.

## 2021-12-06 NOTE — Anesthesia Postprocedure Evaluation (Signed)
Anesthesia Post Note  Patient: Jean Gonzales  Procedure(s) Performed: KNEE ARTHROSCOPY WITH PARTIAL MEDIAL LATERAL MENISECTOMY, PARTIAL SYNOVECTOMY, LOOSE BODY REMOVAL (Left: Knee)  Patient location during evaluation: PACU Anesthesia Type: General Level of consciousness: awake and alert Pain management: pain level controlled Vital Signs Assessment: post-procedure vital signs reviewed and stable Respiratory status: spontaneous breathing, nonlabored ventilation, respiratory function stable and patient connected to nasal cannula oxygen Cardiovascular status: blood pressure returned to baseline and stable Postop Assessment: no apparent nausea or vomiting Anesthetic complications: no Comments: Called to phase 2 for complaints of chest pressure. Patient reports she has been feeling pressure in her chest since she came out of surgery. Vital signs are WNL, BG 120s, EKG showing NSR. Lungs CTAB. Auscultation of heart revealed no murmurs, regular rate and rhythm.  Patient reports she has been having this chest pressure as well as chest pain for a while now. She was previously seeing cardiology who did an extensive workup but did not find any issues with her heart so she was released from the cardiologist. By the end of our conversation patient states that the chest pressure is starting to feel better. Patient discharged with instructions to go to ED if she begins to feel severe chest pain/pressure, SOB, n/v, sweating for workup.    No notable events documented.   Last Vitals:  Vitals:   12/06/21 0915 12/06/21 0936  BP: 119/74 (!) 141/78  Pulse: 78 69  Resp: 17 18  Temp: (!) 36.3 C (!) 36.2 C  SpO2: 100% 100%    Last Pain:  Vitals:   12/06/21 0936  TempSrc: Temporal  PainSc: 3                  Ilene Qua

## 2021-12-06 NOTE — Anesthesia Postprocedure Evaluation (Signed)
Anesthesia Post Note  Patient: Jean Gonzales  Procedure(s) Performed: KNEE ARTHROSCOPY WITH PARTIAL MEDIAL LATERAL MENISECTOMY, PARTIAL SYNOVECTOMY, LOOSE BODY REMOVAL (Left: Knee)  Patient location during evaluation: PACU Anesthesia Type: General Level of consciousness: awake and alert Pain management: pain level controlled Vital Signs Assessment: post-procedure vital signs reviewed and stable Respiratory status: spontaneous breathing, nonlabored ventilation, respiratory function stable and patient connected to nasal cannula oxygen Cardiovascular status: blood pressure returned to baseline and stable Postop Assessment: no apparent nausea or vomiting Anesthetic complications: no   No notable events documented.   Last Vitals:  Vitals:   12/06/21 0915 12/06/21 0936  BP: 119/74 (!) 141/78  Pulse: 78 69  Resp: 17 18  Temp: (!) 36.3 C (!) 36.2 C  SpO2: 100% 100%    Last Pain:  Vitals:   12/06/21 0936  TempSrc: Temporal  PainSc: 3                  Ilene Qua

## 2021-12-13 DIAGNOSIS — M25562 Pain in left knee: Secondary | ICD-10-CM | POA: Diagnosis not present

## 2021-12-13 DIAGNOSIS — M25662 Stiffness of left knee, not elsewhere classified: Secondary | ICD-10-CM | POA: Diagnosis not present

## 2021-12-19 NOTE — Progress Notes (Unsigned)
Established patient visit   Patient: Jean Gonzales   DOB: June 12, 1955   66 y.o. Female  MRN: [id number] Visit Date: 12/22/2021  Today's healthcare provider: [name], FNP  Re Introduced to nurse practitioner role and practice setting.  All questions answered.  Discussed provider/patient relationship and expectations.   I,Shellia Hartl J Zeya Balles,acting as a scribe for [name], FNP.,have documented all relevant documentation on the behalf of [name], FNP,as directed by  [name], FNP while in the presence of [name], FNP.   Chief Complaint  Patient presents with   Diabetes   Subjective    HPI  Diabetes Mellitus Type II, Follow-up  Lab Results  Component Value Date   HGBA1C 5.7 (A) 09/19/2021   HGBA1C 6.0 (H) 05/16/2021   HGBA1C 6.5 (A) 02/16/2021   Wt Readings from Last 3 Encounters:  12/22/21 190 lb (86.2 kg)  12/22/21 190 lb (86.2 kg)  12/06/21 190 lb (86.2 kg)   Last seen for diabetes 3 months ago.  Management since then includes continue healthy lifestyle choices. She reports excellent compliance with treatment. She is not having side effects.   Symptoms: No fatigue No foot ulcerations  No appetite changes No nausea  Yes paresthesia of the feet  No polydipsia  No polyuria No visual disturbances   No vomiting     Home blood sugar records:  not checked regularly  Episodes of hypoglycemia? No    Current insulin regiment: ozempic and taltz Most Recent Eye Exam: due Current exercise: none Current diet habits: well balanced  Pertinent Labs: Lab Results  Component Value Date   CHOL 169 05/16/2021   HDL 38 (L) 05/16/2021   LDLCALC 94 05/16/2021   TRIG 217 (H) 05/16/2021   CHOLHDL 4.4 05/16/2021   Lab Results  Component Value Date   NA 141 12/01/2021   K 3.3 (L) 12/01/2021   CREATININE 0.81 12/01/2021   GFRNONAA >60 12/01/2021   MICROALBUR 20 10/08/2019   LABMICR 25.6 09/19/2021      ---------------------------------------------------------------------------------------------------   Medications: Outpatient Medications Prior to Visit  Medication Sig   acetaminophen (TYLENOL) 325 MG tablet Take 1-2 tablets (325-650 mg total) by mouth every 6 (six) hours as needed for mild pain (pain score 1-3 or temp > 100.5).   Blood Glucose Monitoring Suppl (ONE TOUCH ULTRA 2) w/Device KIT Use as directed   celecoxib (CELEBREX) 100 MG capsule Take 1 capsule (100 mg total) by mouth 2 (two) times daily.   diclofenac sodium (VOLTAREN) 1 % GEL Apply 2 g topically 4 (four) times daily. Apply to knee   docusate sodium (COLACE) 100 MG capsule Take 1 capsule (100 mg total) by mouth daily as needed.   esomeprazole (NEXIUM) 40 MG capsule TAKE ONE CAPSULE BY MOUTH DAILY (Patient taking differently: 40 mg every morning. TAKE ONE CAPSULE BY MOUTH DAILY)   fenofibrate (TRICOR) 145 MG tablet Take 1 tablet (145 mg total) by mouth daily. (Patient taking differently: Take 145 mg by mouth every evening.)   glucose blood (ONETOUCH VERIO) test strip Use as instructed   HYDROcodone-acetaminophen (NORCO/VICODIN) 5-325 MG tablet Take 1 tablet by mouth every 4 (four) hours as needed for moderate pain.   nystatin (MYCOSTATIN/NYSTOP) powder Apply topically 3 (three) times daily. (Patient taking differently: Apply 1 Application topically daily as needed.)   OneTouch Delica Lancets 55D MISC Use as directed   OVER THE COUNTER MEDICATION Take 1 tablet by mouth 2 (two) times daily. Jonette Eva  DEFEND   OVER THE COUNTER MEDICATION Take 1 tablet by mouth 2 (two) times daily. VIVISCAL   OZEMPIC, 2 MG/DOSE, 8 MG/3ML SOPN DIAL AND INJECT UNDER THE SKIN 2 MG WEEKLY (Patient taking differently: Inject 2 mg into the skin once a week. On Sundays)   rosuvastatin (CRESTOR) 20 MG tablet Take 1 tablet (20 mg total) by mouth daily. (Patient taking differently: Take 20 mg by mouth every morning.)   tamsulosin (FLOMAX) 0.4 MG CAPS capsule  Take 1 capsule (0.4 mg total) by mouth daily. (Patient taking differently: Take 0.4 mg by mouth daily after breakfast.)   triamcinolone ointment (KENALOG) 0.1 % APPLY TO AFFECTED AREA TWICE A DAY (Patient taking differently: 1 Application as needed.)   No facility-administered medications prior to visit.    Review of Systems    Objective    BP 112/75 (BP Location: Right Arm, Patient Position: Sitting, Cuff Size: Normal)   Pulse 89   Temp 97.8 F (36.6 C) (Oral)   Resp 16   Ht 5' 7"  (1.702 m)   Wt 190 lb (86.2 kg)   SpO2 99%   BMI 29.76 kg/m   Physical Exam Vitals and nursing note reviewed.  Constitutional:      General: She is not in acute distress.    Appearance: Normal appearance. She is overweight. She is not ill-appearing, toxic-appearing or diaphoretic.  HENT:     Head: Normocephalic and atraumatic.  Cardiovascular:     Rate and Rhythm: Normal rate and regular rhythm.     Pulses: Normal pulses.     Heart sounds: Normal heart sounds. No murmur heard.    No friction rub. No gallop.  Pulmonary:     Effort: Pulmonary effort is normal. No respiratory distress.     Breath sounds: Normal breath sounds. No stridor. No wheezing, rhonchi or rales.  Chest:     Chest wall: No tenderness.  Abdominal:     General: Bowel sounds are normal.     Palpations: Abdomen is soft.  Musculoskeletal:        General: No swelling, tenderness, deformity or signs of injury. Normal range of motion.     Right lower leg: No edema.     Left lower leg: No edema.  Skin:    General: Skin is warm and dry.     Capillary Refill: Capillary refill takes less than 2 seconds.     Coloration: Skin is not jaundiced or pale.     Findings: No bruising, erythema, lesion or rash.  Neurological:     General: No focal deficit present.     Mental Status: She is alert and oriented to person, place, and time. Mental status is at baseline.     Cranial Nerves: No cranial nerve deficit.     Sensory: No sensory  deficit.     Motor: No weakness.     Coordination: Coordination normal.  Psychiatric:        Mood and Affect: Mood normal.        Behavior: Behavior normal.        Thought Content: Thought content normal.        Judgment: Judgment normal.      No results found for any visits on 12/22/21.  Assessment & Plan     Problem List Items Addressed This Visit       Cardiovascular and Mediastinum   Hypertension associated with diabetes ([place])    Chronic, improved with weight loss No longer using HCTZ 12.5 mg to  assist Continue to monitor Goal of <130/<80        Endocrine   Hyperlipidemia associated with type 2 diabetes mellitus ([facility])    Chronic, currently on 20 mg crestor and tricor 145 mg recommend diet low in saturated fat and regular exercise - 30 min at least 5 times per week Previous panel notes LDL of 94 and HDL of 38 and trig of 217      Type 2 diabetes mellitus with diabetic polyneuropathy, without long-term current use of insulin ([facility]CC) - Primary    Chronic, previously stable with A1c goal of <7% Continue ozempic 2 mg Continue to recommend balanced, lower carb meals. Smaller meal size, adding snacks. Choosing water as drink of choice and increasing purposeful exercise.       Relevant Medications   diclofenac Sodium (VOLTAREN) 1 % GEL   Other Relevant Orders   Comprehensive metabolic panel   Hemoglobin A1c   Lipid panel     Musculoskeletal and Integument   Osteoarthritis of right knee    Chronic, waxes/wanes  Currently using celebrex 100 mg BID, norco 1 tab every 4 hours PRN      Relevant Medications   diclofenac Sodium (VOLTAREN) 1 % GEL     Other   Hypokalemia    Acute, unknown cause Recommend repeat Chem        No follow-ups on file.      Vonna Kotyk, FNP, have reviewed all documentation for this visit. The documentation on 12/22/21 for the exam, diagnosis, procedures, and orders are all accurate and complete.    [name], [place] [phone] (phone) [phone] (fax)  [place]

## 2021-12-20 ENCOUNTER — Ambulatory Visit: Payer: Medicare HMO | Admitting: Family Medicine

## 2021-12-21 DIAGNOSIS — Z9889 Other specified postprocedural states: Secondary | ICD-10-CM | POA: Insufficient documentation

## 2021-12-22 ENCOUNTER — Encounter: Payer: Self-pay | Admitting: Family Medicine

## 2021-12-22 ENCOUNTER — Ambulatory Visit (INDEPENDENT_AMBULATORY_CARE_PROVIDER_SITE_OTHER): Payer: Medicare HMO | Admitting: Family Medicine

## 2021-12-22 ENCOUNTER — Ambulatory Visit (INDEPENDENT_AMBULATORY_CARE_PROVIDER_SITE_OTHER): Payer: Medicare HMO

## 2021-12-22 VITALS — BP 112/75 | HR 89 | Temp 97.8°F | Resp 16 | Ht 67.0 in | Wt 190.0 lb

## 2021-12-22 VITALS — BP 112/75 | Ht 67.0 in | Wt 190.0 lb

## 2021-12-22 DIAGNOSIS — I152 Hypertension secondary to endocrine disorders: Secondary | ICD-10-CM | POA: Diagnosis not present

## 2021-12-22 DIAGNOSIS — E785 Hyperlipidemia, unspecified: Secondary | ICD-10-CM | POA: Diagnosis not present

## 2021-12-22 DIAGNOSIS — E1159 Type 2 diabetes mellitus with other circulatory complications: Secondary | ICD-10-CM | POA: Diagnosis not present

## 2021-12-22 DIAGNOSIS — E1142 Type 2 diabetes mellitus with diabetic polyneuropathy: Secondary | ICD-10-CM

## 2021-12-22 DIAGNOSIS — E1169 Type 2 diabetes mellitus with other specified complication: Secondary | ICD-10-CM

## 2021-12-22 DIAGNOSIS — E876 Hypokalemia: Secondary | ICD-10-CM

## 2021-12-22 DIAGNOSIS — M1711 Unilateral primary osteoarthritis, right knee: Secondary | ICD-10-CM | POA: Diagnosis not present

## 2021-12-22 DIAGNOSIS — Z0000 Encounter for general adult medical examination without abnormal findings: Secondary | ICD-10-CM | POA: Diagnosis not present

## 2021-12-22 DIAGNOSIS — M25662 Stiffness of left knee, not elsewhere classified: Secondary | ICD-10-CM | POA: Diagnosis not present

## 2021-12-22 DIAGNOSIS — M25562 Pain in left knee: Secondary | ICD-10-CM | POA: Diagnosis not present

## 2021-12-22 MED ORDER — DICLOFENAC SODIUM 1 % EX GEL: 4.0000 g | Freq: Four times a day (QID) | CUTANEOUS | 11 refills | Status: DC

## 2021-12-22 NOTE — Progress Notes (Signed)
Subjective:   Jean Gonzales is a 66 y.o. female who presents for Medicare Annual (Subsequent) preventive examination.  Review of Systems     Cardiac Risk Factors include: advanced age (>37mn, >>10women);diabetes mellitus;dyslipidemia     Objective:    Today's Vitals   12/22/21 1343  BP: 112/75  Weight: 190 lb (86.2 kg)  Height: 5' 7"  (1.702 m)   Body mass index is 29.76 kg/m.     12/22/2021    2:00 PM 12/06/2021    6:46 AM 11/30/2021    3:29 PM 03/09/2020   11:12 AM 08/08/2019    7:54 AM 02/25/2018    7:06 AM 02/05/2018    6:33 AM  Advanced Directives  Does Patient Have a Medical Advance Directive? No No Yes No No No No  Does patient want to make changes to medical advance directive?  No - Patient declined       Would patient like information on creating a medical advance directive? No - Patient declined   No - Patient declined  No - Patient declined No - Patient declined    Current Medications (verified) Outpatient Encounter Medications as of 12/22/2021  Medication Sig   acetaminophen (TYLENOL) 325 MG tablet Take 1-2 tablets (325-650 mg total) by mouth every 6 (six) hours as needed for mild pain (pain score 1-3 or temp > 100.5).   Blood Glucose Monitoring Suppl (ONE TOUCH ULTRA 2) w/Device KIT Use as directed   celecoxib (CELEBREX) 100 MG capsule Take 1 capsule (100 mg total) by mouth 2 (two) times daily.   diclofenac sodium (VOLTAREN) 1 % GEL Apply 2 g topically 4 (four) times daily. Apply to knee   diclofenac Sodium (VOLTAREN) 1 % GEL Apply 4 g topically 4 (four) times daily.   docusate sodium (COLACE) 100 MG capsule Take 1 capsule (100 mg total) by mouth daily as needed.   esomeprazole (NEXIUM) 40 MG capsule TAKE ONE CAPSULE BY MOUTH DAILY (Patient taking differently: 40 mg every morning. TAKE ONE CAPSULE BY MOUTH DAILY)   fenofibrate (TRICOR) 145 MG tablet Take 1 tablet (145 mg total) by mouth daily. (Patient taking differently: Take 145 mg by mouth every  evening.)   glucose blood (ONETOUCH VERIO) test strip Use as instructed   HYDROcodone-acetaminophen (NORCO/VICODIN) 5-325 MG tablet Take 1 tablet by mouth every 4 (four) hours as needed for moderate pain.   nystatin (MYCOSTATIN/NYSTOP) powder Apply topically 3 (three) times daily. (Patient taking differently: Apply 1 Application topically daily as needed.)   OneTouch Delica Lancets 302MMISC Use as directed   OVER THE COUNTER MEDICATION Take 1 tablet by mouth 2 (two) times daily. UQORA DEFEND   OVER THE COUNTER MEDICATION Take 1 tablet by mouth 2 (two) times daily. VIVISCAL   OZEMPIC, 2 MG/DOSE, 8 MG/3ML SOPN DIAL AND INJECT UNDER THE SKIN 2 MG WEEKLY (Patient taking differently: Inject 2 mg into the skin once a week. On Sundays)   rosuvastatin (CRESTOR) 20 MG tablet Take 1 tablet (20 mg total) by mouth daily. (Patient taking differently: Take 20 mg by mouth every morning.)   tamsulosin (FLOMAX) 0.4 MG CAPS capsule Take 1 capsule (0.4 mg total) by mouth daily. (Patient taking differently: Take 0.4 mg by mouth daily after breakfast.)   triamcinolone ointment (KENALOG) 0.1 % APPLY TO AFFECTED AREA TWICE A DAY (Patient taking differently: 1 Application as needed.)   No facility-administered encounter medications on file as of 12/22/2021.    Allergies (verified) Iodinated contrast media and Bactrim [sulfamethoxazole-trimethoprim]  History: Past Medical History:  Diagnosis Date   Ankylosing spondylitis ([place])    Arthritis    osteoarthritis right knee   Diet-controlled type 2 diabetes mellitus ([place])    Endometrial cancer ([place]) 2014   GERD (gastroesophageal reflux disease)    History of kidney stones    History of recurrent UTIs    a.) r/t self catheterization techniques and frequency; b.) followed by urology and is on daily suppressive antimicrobial therapy   Hx of migraine headaches    Hyperlipidemia    Hypertension    improved after ca tx   Intermittent self-catheterization of bladder     a.) required following XRT causing stenosis of urinary meatus; b.) self caths TWICE daily   Psoriatic arthritis ([place])    Urethral meatal stenosis    a.) following XRT for endometrial cancer   Past Surgical History:  Procedure Laterality Date   ABDOMINAL HYSTERECTOMY  2015   cancer   BREAST BIOPSY Right 2008ish   core   CATARACT EXTRACTION W/PHACO Right 02/05/2018   Procedure: CATARACT EXTRACTION PHACO AND INTRAOCULAR LENS PLACEMENT ([place]) RIGHT DIABETES IVA TOPICAL;  Surgeon: Eulogio Bear, MD;  Location: [place];  Service: Ophthalmology;  Laterality: Right;   CATARACT EXTRACTION W/PHACO Left 02/25/2018   Procedure: CATARACT EXTRACTION PHACO AND INTRAOCULAR LENS PLACEMENT ([place]) LEFT DIABETIC;  Surgeon: Eulogio Bear, MD;  Location: [place] [facility];  Service: Ophthalmology;  Laterality: Left;   CHOLECYSTECTOMY  1992   COLONOSCOPY WITH PROPOFOL N/A 08/08/2019   Procedure: COLONOSCOPY WITH PROPOFOL;  Surgeon: Jonathon Bellows, MD;  Location: [facility];  Service: Gastroenterology;  Laterality: N/A;   EYE SURGERY     KNEE ARTHROSCOPY WITH MEDIAL MENISECTOMY Left 12/06/2021   Procedure: KNEE ARTHROSCOPY WITH PARTIAL MEDIAL LATERAL MENISECTOMY, PARTIAL SYNOVECTOMY, LOOSE BODY REMOVAL;  Surgeon: Lovell Sheehan, MD;  Location: [facility];  Service: Orthopedics;  Laterality: Left;   TONSILLECTOMY  1973   TONSILLECTOMY     TOTAL KNEE ARTHROPLASTY Right 07/09/2017   Procedure: TOTAL KNEE ARTHROPLASTY;  Surgeon: Lovell Sheehan, MD;  Location: [facility];  Service: Orthopedics;  Laterality: Right;   Family History  Problem Relation Age of Onset   Diabetes Mother    Cancer Mother    Heart disease Father    Breast cancer Neg Hx    Kidney cancer Neg Hx    Bladder Cancer Neg Hx    Social History   Socioeconomic History   Marital status: Divorced    Spouse name: Not on file   Number of children: Not on file   Years of education: Not on file   Highest education level: Not on  file  Occupational History   Not on file  Tobacco Use   Smoking status: Never   Smokeless tobacco: Never  Vaping Use   Vaping Use: Never used  Substance and Sexual Activity   Alcohol use: No    Alcohol/week: 0.0 standard drinks of alcohol   Drug use: No   Sexual activity: Not Currently  Other Topics Concern   Not on file  Social History Narrative   Not on file   Social Determinants of Health   Financial Resource Strain: Low Risk  (12/22/2021)   Overall Financial Resource Strain (CARDIA)    Difficulty of Paying Living Expenses: Not hard at all  Food Insecurity: No Food Insecurity (12/22/2021)   Hunger Vital Sign    Worried About Running Out of Food in the Last Year: Never true    Ran  Out of Food in the Last Year: Never true  Transportation Needs: No Transportation Needs (12/22/2021)   PRAPARE - Hydrologist (Medical): No    Lack of Transportation (Non-Medical): No  Physical Activity: Sufficiently Active (12/22/2021)   Exercise Vital Sign    Days of Exercise per Week: 4 days    Minutes of Exercise per Session: 60 min  Stress: No Stress Concern Present (12/22/2021)   [place]    Feeling of Stress : Only a little  Social Connections: Moderately Integrated (12/22/2021)   Social Connection and Isolation Panel [NHANES]    Frequency of Communication with Friends and Family: More than three times a week    Frequency of Social Gatherings with Friends and Family: Twice a week    Attends Religious Services: More than 4 times per year    Active Member of [employer] or Organizations: No    Attends Music therapist: Never    Marital Status: Married    Tobacco Counseling Counseling given: Not Answered   Clinical Intake:  Pre-visit preparation completed: Yes  Pain : No/denies pain     Diabetes: Yes CBG done?: No Did pt. bring in CBG monitor from home?: No  How often do you  need to have someone help you when you read instructions, pamphlets, or other written materials from your doctor or pharmacy?: 1 - Never  Diabetic?yes Nutrition Risk Assessment:  Has the patient had any N/V/D within the last 2 months?  Yes  Does the patient have any non-healing wounds?  No  Has the patient had any unintentional weight loss or weight gain?  No   Diabetes:  Is the patient diabetic?  Yes  If diabetic, was a CBG obtained today?  No  Did the patient bring in their glucometer from home?  No  How often do you monitor your CBG's? occasionally.   Financial Strains and Diabetes Management:  Are you having any financial strains with the device, your supplies or your medication? No .  Does the patient want to be seen by Chronic Care Management for management of their diabetes?  No  Would the patient like to be referred to a Nutritionist or for Diabetic Management?  No   Diabetic Exams:  Diabetic Eye Exam: Completed 12/20/20. Overdue for diabetic eye exam. Pt has been advised about the importance in completing this exam.   Diabetic Foot Exam: Completed 06/28/20. Pt has been advised about the importance in completing this exam.   Interpreter Needed?: No  Information entered by :: Kirke Shaggy, LPN   Activities of Daily Living    12/22/2021    2:01 PM 12/22/2021    1:14 PM  In your present state of health, do you have any difficulty performing the following activities:  Hearing? 0 0  Vision? 1 1  Difficulty concentrating or making decisions? 0 0  Walking or climbing stairs? 1 1  Dressing or bathing? 0 0  Doing errands, shopping? 1 1  Preparing Food and eating ? N   Using the Toilet? N   In the past six months, have you accidently leaked urine? N   Do you have problems with loss of bowel control? N   Managing your Medications? N   Managing your Finances? N   Housekeeping or managing your Housekeeping? Y     Patient Care Team: [name], FNP as PCP - General  (Family Medicine) Kate Sable, MD as  PCP - Cardiology (Cardiology) Neldon Labella, RN as Case Manager  Indicate any recent Medical Services you may have received from other than [facility] providers in the past year (date may be approximate).     Assessment:   This is a routine wellness examination for Wyolene.  Hearing/Vision screen Hearing Screening - Comments:: No aids Vision Screening - Comments:: No glasses- [facility]  Dietary issues and exercise activities discussed: Current Exercise Habits: Home exercise routine, Type of exercise: walking, Time (Minutes): 60, Frequency (Times/Week): 5, Weekly Exercise (Minutes/Week): 300, Intensity: Mild   Goals Addressed             This Visit's Progress    DIET - EAT MORE FRUITS AND VEGETABLES         Depression Screen    12/22/2021    1:57 PM 12/22/2021    1:14 PM 09/19/2021    1:30 PM 05/16/2021    3:09 PM 11/11/2020   11:02 AM 08/12/2020    9:55 AM 06/28/2020    3:08 PM  PHQ 2/9 Scores  PHQ - 2 Score 0 0 0 0 0 0 0  PHQ- 9 Score 0 0 2 0 2 2 2     Fall Risk    12/22/2021    2:01 PM 12/22/2021    1:14 PM 09/19/2021    1:30 PM 05/16/2021    3:09 PM 11/11/2020   11:03 AM  Fall Risk   Falls in the past year? 0 0 0 0 0  Number falls in past yr: 0 0 0  0  Injury with Fall? 0 0 0  0  Risk for fall due to : No Fall Risks No Fall Risks   No Fall Risks  Follow up Falls prevention discussed;Falls evaluation completed Falls evaluation completed   Falls evaluation completed    FALL RISK PREVENTION PERTAINING TO THE HOME:  Any stairs in or around the home? No  If so, are there any without handrails? No  Home free of loose throw rugs in walkways, pet beds, electrical cords, etc? Yes  Adequate lighting in your home to reduce risk of falls? Yes   ASSISTIVE DEVICES UTILIZED TO PREVENT FALLS:  Life alert? No  Use of a cane, walker or w/c? Yes  Grab bars in the bathroom? Yes  Shower chair or bench in shower? Yes  Elevated toilet  seat or a handicapped toilet? No   TIMED UP AND GO:  Was the test performed? Yes .  Length of time to ambulate 10 feet: 5 sec.   Gait slow and steady with assistive device  Cognitive Function:        12/22/2021    2:11 PM  6CIT Screen  What Year? 0 points  What month? 0 points  What time? 0 points  Count back from 20 0 points  Months in reverse 0 points  Repeat phrase 0 points  Total Score 0 points    Immunizations Immunization History  Administered Date(s) Administered   Fluad Quad(high Dose 65+) 02/16/2021   Influenza,inj,Quad PF,6+ Mos 12/18/2017   PFIZER(Purple Top)SARS-COV-2 Vaccination 09/24/2019, 10/15/2019, 04/09/2020   PNEUMOCOCCAL CONJUGATE-20 08/12/2020   Pneumococcal Polysaccharide-23 12/05/2010    TDAP status: Due, Education has been provided regarding the importance of this vaccine. Advised may receive this vaccine at local pharmacy or Health Dept. Aware to provide a copy of the vaccination record if obtained from local pharmacy or Health Dept. Verbalized acceptance and understanding.  Flu Vaccine status: Up to date  Pneumococcal vaccine status:  Up to date  Covid-19 vaccine status: Completed vaccines  Qualifies for Shingles Vaccine? Yes   Zostavax completed No   Shingrix Completed?: No.    Education has been provided regarding the importance of this vaccine. Patient has been advised to call insurance company to determine out of pocket expense if they have not yet received this vaccine. Advised may also receive vaccine at local pharmacy or Health Dept. Verbalized acceptance and understanding.  Screening Tests Health Maintenance  Topic Date Due   PAP SMEAR-Modifier  Never done   TETANUS/TDAP  Never done   Zoster Vaccines- Shingrix (1 of 2) Never done   COVID-19 Vaccine (4 - Pfizer risk series) 06/04/2020   FOOT EXAM  06/28/2021   INFLUENZA VACCINE  10/25/2021   OPHTHALMOLOGY EXAM  12/20/2021   HEMOGLOBIN A1C  03/21/2022   Diabetic kidney evaluation  - Urine ACR  09/20/2022   Diabetic kidney evaluation - GFR measurement  12/02/2022   MAMMOGRAM  02/12/2023   COLONOSCOPY (Pts 45-12yr Insurance coverage will need to be confirmed)  08/08/2026   Pneumonia Vaccine 66 Years old  Completed   DEXA SCAN  Completed   Hepatitis C Screening  Completed   HPV VACCINES  Aged Out   COLON CANCER SCREENING ANNUAL FOBT  Discontinued    Health Maintenance  Health Maintenance Due  Topic Date Due   PAP SMEAR-Modifier  Never done   TETANUS/TDAP  Never done   Zoster Vaccines- Shingrix (1 of 2) Never done   COVID-19 Vaccine (4 - Pfizer risk series) 06/04/2020   FOOT EXAM  06/28/2021   INFLUENZA VACCINE  10/25/2021   OPHTHALMOLOGY EXAM  12/20/2021    Colorectal cancer screening: Type of screening: Colonoscopy. Completed 08/08/19. Repeat every 7 years  Mammogram status: Completed 02/11/21. Repeat every year- had one this year  Bone Density status: Completed 07/15/19. Results reflect: Bone density results: NORMAL. Repeat every 5 years.  Lung Cancer Screening: (Low Dose CT Chest recommended if Age 68-80years, 30 pack-year currently smoking OR have quit w/in 15years.) does not qualify.    Additional Screening:  Hepatitis C Screening: does qualify; Completed 07/03/19  Vision Screening: Recommended annual ophthalmology exams for early detection of glaucoma and other disorders of the eye. Is the patient up to date with their annual eye exam?  Yes  Who is the provider or what is the name of the office in which the patient attends annual eye exams? [facility] pt is not established with a provider, would they like to be referred to a provider to establish care? No .   Dental Screening: Recommended annual dental exams for proper oral hygiene  Community Resource Referral / Chronic Care Management: CRR required this visit?  No   CCM required this visit?  No      Plan:     I have personally reviewed and noted the following in the patient's  chart:   Medical and social history Use of alcohol, tobacco or illicit drugs  Current medications and supplements including opioid prescriptions. Patient is currently taking opioid prescriptions. Information provided to patient regarding non-opioid alternatives. Patient advised to discuss non-opioid treatment plan with their provider. Functional ability and status Nutritional status Physical activity Advanced directives List of other physicians Hospitalizations, surgeries, and ER visits in previous 12 months Vitals Screenings to include cognitive, depression, and falls Referrals and appointments  In addition, I have reviewed and discussed with patient certain preventive protocols, quality metrics, and best practice recommendations. A written personalized care plan  for preventive services as well as general preventive health recommendations were provided to patient.     Dionisio David, LPN   [id number]   Nurse Notes: none

## 2021-12-22 NOTE — Assessment & Plan Note (Signed)
Chronic, improved with weight loss No longer using HCTZ 12.5 mg to assist Continue to monitor Goal of <130/<80

## 2021-12-22 NOTE — Assessment & Plan Note (Signed)
Acute, unknown cause Recommend repeat Chem

## 2021-12-22 NOTE — Assessment & Plan Note (Signed)
Chronic, previously stable with A1c goal of <7% Continue ozempic 2 mg Continue to recommend balanced, lower carb meals. Smaller meal size, adding snacks. Choosing water as drink of choice and increasing purposeful exercise.

## 2021-12-22 NOTE — Assessment & Plan Note (Signed)
Chronic, waxes/wanes  Currently using celebrex 100 mg BID, norco 1 tab every 4 hours PRN

## 2021-12-22 NOTE — Patient Instructions (Addendum)
[name] , Thank you for taking time to come for your Medicare Wellness Visit. I appreciate your ongoing commitment to your health goals. Please review the following plan we discussed and let me know if I can assist you in the future.   Screening recommendations/referrals: Colonoscopy: 08/08/19 Mammogram: 02/11/21 Bone Density: 07/15/19 Recommended yearly ophthalmology/optometry visit for glaucoma screening and checkup Recommended yearly dental visit for hygiene and checkup  Vaccinations: Influenza vaccine: 02/16/21 Pneumococcal vaccine: 08/12/20 Tdap vaccine: n/d Shingles vaccine: n/d   Covid-19:09/24/19, 10/15/19, 04/09/20  Advanced directives: no  Conditions/risks identified: none  Next appointment: Follow up in one year for your annual wellness visit 12/25/22 @ 2:30 pm in person   Preventive Care 68 Years and Older, Female Preventive care refers to lifestyle choices and visits with your health care provider that can promote health and wellness. What does preventive care include? A yearly physical exam. This is also called an annual well check. Dental exams once or twice a year. Routine eye exams. Ask your health care provider how often you should have your eyes checked. Personal lifestyle choices, including: Daily care of your teeth and gums. Regular physical activity. Eating a healthy diet. Avoiding tobacco and drug use. Limiting alcohol use. Practicing safe sex. Taking low-dose aspirin every day. Taking vitamin and mineral supplements as recommended by your health care provider. What happens during an annual well check? The services and screenings done by your health care provider during your annual well check will depend on your age, overall health, lifestyle risk factors, and family history of disease. Counseling  Your health care provider may ask you questions about your: Alcohol use. Tobacco use. Drug use. Emotional well-being. Home and relationship  well-being. Sexual activity. Eating habits. History of falls. Memory and ability to understand (cognition). Work and work Statistician. Reproductive health. Screening  You may have the following tests or measurements: Height, weight, and BMI. Blood pressure. Lipid and cholesterol levels. These may be checked every 5 years, or more frequently if you are over 50 years old. Skin check. Lung cancer screening. You may have this screening every year starting at age 45 if you have a 30-pack-year history of smoking and currently smoke or have quit within the past 15 years. Fecal occult blood test (FOBT) of the stool. You may have this test every year starting at age 5. Flexible sigmoidoscopy or colonoscopy. You may have a sigmoidoscopy every 5 years or a colonoscopy every 10 years starting at age 59. Hepatitis C blood test. Hepatitis B blood test. Sexually transmitted disease (STD) testing. Diabetes screening. This is done by checking your blood sugar (glucose) after you have not eaten for a while (fasting). You may have this done every 1-3 years. Bone density scan. This is done to screen for osteoporosis. You may have this done starting at age 49. Mammogram. This may be done every 1-2 years. Talk to your health care provider about how often you should have regular mammograms. Talk with your health care provider about your test results, treatment options, and if necessary, the need for more tests. Vaccines  Your health care provider may recommend certain vaccines, such as: Influenza vaccine. This is recommended every year. Tetanus, diphtheria, and acellular pertussis (Tdap, Td) vaccine. You may need a Td booster every 10 years. Zoster vaccine. You may need this after age 67. Pneumococcal 13-valent conjugate (PCV13) vaccine. One dose is recommended after age 68. Pneumococcal polysaccharide (PPSV23) vaccine. One dose is recommended after age 68. Talk to your health care  provider about which  screenings and vaccines you need and how often you need them. This information is not intended to replace advice given to you by your health care provider. Make sure you discuss any questions you have with your health care provider. Document Released: 04/09/2015 Document Revised: 12/01/2015 Document Reviewed: 01/12/2015 Elsevier Interactive Patient Education  2017 [place] [facility] Prevention in the Home Falls can cause injuries. They can happen to people of all ages. There are many things you can do to make your home safe and to help prevent falls. What can I do on the outside of my home? Regularly fix the edges of walkways and driveways and fix any cracks. Remove anything that might make you trip as you walk through a door, such as a raised step or threshold. Trim any bushes or trees on the path to your home. Use bright outdoor lighting. Clear any walking paths of anything that might make someone trip, such as rocks or tools. Regularly check to see if handrails are loose or broken. Make sure that both sides of any steps have handrails. Any raised decks and porches should have guardrails on the edges. Have any leaves, snow, or ice cleared regularly. Use sand or salt on walking paths during winter. Clean up any spills in your garage right away. This includes oil or grease spills. What can I do in the bathroom? Use night lights. Install grab bars by the toilet and in the tub and shower. Do not use towel bars as grab bars. Use non-skid mats or decals in the tub or shower. If you need to sit down in the shower, use a plastic, non-slip stool. Keep the floor dry. Clean up any water that spills on the floor as soon as it happens. Remove soap buildup in the tub or shower regularly. Attach bath mats securely with double-sided non-slip rug tape. Do not have throw rugs and other things on the floor that can make you trip. What can I do in the bedroom? Use night lights. Make sure that you have a  light by your bed that is easy to reach. Do not use any sheets or blankets that are too big for your bed. They should not hang down onto the floor. Have a firm chair that has side arms. You can use this for support while you get dressed. Do not have throw rugs and other things on the floor that can make you trip. What can I do in the kitchen? Clean up any spills right away. Avoid walking on wet floors. Keep items that you use a lot in easy-to-reach places. If you need to reach something above you, use a strong step stool that has a grab bar. Keep electrical cords out of the way. Do not use floor polish or wax that makes floors slippery. If you must use wax, use non-skid floor wax. Do not have throw rugs and other things on the floor that can make you trip. What can I do with my stairs? Do not leave any items on the stairs. Make sure that there are handrails on both sides of the stairs and use them. Fix handrails that are broken or loose. Make sure that handrails are as long as the stairways. Check any carpeting to make sure that it is firmly attached to the stairs. Fix any carpet that is loose or worn. Avoid having throw rugs at the top or bottom of the stairs. If you do have throw rugs, attach them to the  floor with carpet tape. Make sure that you have a light switch at the top of the stairs and the bottom of the stairs. If you do not have them, ask someone to add them for you. What else can I do to help prevent falls? Wear shoes that: Do not have high heels. Have rubber bottoms. Are comfortable and fit you well. Are closed at the toe. Do not wear sandals. If you use a stepladder: Make sure that it is fully opened. Do not climb a closed stepladder. Make sure that both sides of the stepladder are locked into place. Ask someone to hold it for you, if possible. Clearly mark and make sure that you can see: Any grab bars or handrails. First and last steps. Where the edge of each step  is. Use tools that help you move around (mobility aids) if they are needed. These include: Canes. Walkers. Scooters. Crutches. Turn on the lights when you go into a dark area. Replace any light bulbs as soon as they burn out. Set up your furniture so you have a clear path. Avoid moving your furniture around. If any of your floors are uneven, fix them. If there are any pets around you, be aware of where they are. Review your medicines with your doctor. Some medicines can make you feel dizzy. This can increase your chance of falling. Ask your doctor what other things that you can do to help prevent falls. This information is not intended to replace advice given to you by your health care provider. Make sure you discuss any questions you have with your health care provider. Document Released: 01/07/2009 Document Revised: 08/19/2015 Document Reviewed: 04/17/2014 Elsevier Interactive Patient Education  2017 [employer].

## 2021-12-22 NOTE — Assessment & Plan Note (Signed)
Chronic, currently on 20 mg crestor and tricor 145 mg recommend diet low in saturated fat and regular exercise - 30 min at least 5 times per week Previous panel notes LDL of 94 and HDL of 38 and trig of 217

## 2021-12-23 LAB — COMPREHENSIVE METABOLIC PANEL WITH GFR
ALT: 12 IU/L (ref 0–32)
AST: 15 IU/L (ref 0–40)
Albumin/Globulin Ratio: 1.8 (ref 1.2–2.2)
Albumin: 4.7 g/dL (ref 3.9–4.9)
Alkaline Phosphatase: 90 IU/L (ref 44–121)
BUN/Creatinine Ratio: 12 (ref 12–28)
BUN: 11 mg/dL (ref 8–27)
Bilirubin Total: 0.6 mg/dL (ref 0.0–1.2)
CO2: 23 mmol/L (ref 20–29)
Calcium: 10.2 mg/dL (ref 8.7–10.3)
Chloride: 102 mmol/L (ref 96–106)
Creatinine, Ser: 0.93 mg/dL (ref 0.57–1.00)
Globulin, Total: 2.6 g/dL (ref 1.5–4.5)
Glucose: 135 mg/dL — ABNORMAL HIGH (ref 70–99)
Potassium: 3.5 mmol/L (ref 3.5–5.2)
Sodium: 141 mmol/L (ref 134–144)
Total Protein: 7.3 g/dL (ref 6.0–8.5)
eGFR: 67 mL/min/1.73

## 2021-12-23 LAB — HEMOGLOBIN A1C
Est. average glucose Bld gHb Est-mCnc: 114 mg/dL
Hgb A1c MFr Bld: 5.6 % (ref 4.8–5.6)

## 2021-12-23 LAB — LIPID PANEL
Chol/HDL Ratio: 3.5 ratio (ref 0.0–4.4)
Cholesterol, Total: 153 mg/dL (ref 100–199)
HDL: 44 mg/dL
LDL Chol Calc (NIH): 70 mg/dL (ref 0–99)
Triglycerides: 242 mg/dL — ABNORMAL HIGH (ref 0–149)
VLDL Cholesterol Cal: 39 mg/dL (ref 5–40)

## 2021-12-23 NOTE — Progress Notes (Signed)
Potassium has stabilized; Cholesterol is improved; A1c is also improved, now 5.6% which is OUT of the pre-diabetes range. Congrats.  The 10-year ASCVD risk score (Arnett DK, et al., 2019) is: 10.8%   Values used to calculate the score:     Age: 66 years     Sex: Female     Is Non-Hispanic African American: No     Diabetic: Yes     Tobacco smoker: No     Systolic Blood Pressure: 517 mmHg     Is BP treated: No     HDL Cholesterol: 44 mg/dL     Total Cholesterol: 153 mg/dL  Continue tricor 145 and crestor 20 mg. Heart attack and stroke risk is 11% estimated within the next 10 years which is moderate. I recommend diet low in saturated fat and regular exercise - 30 min at least 5 times per week  [name], [place] [address] [place], [place] [address] [phone] (phone) [phone] (fax) [place]

## 2022-01-01 ENCOUNTER — Emergency Department: Payer: Medicare HMO

## 2022-01-01 ENCOUNTER — Encounter: Payer: Self-pay | Admitting: Intensive Care

## 2022-01-01 ENCOUNTER — Emergency Department
Admission: EM | Admit: 2022-01-01 | Discharge: 2022-01-01 | Disposition: A | Discharge status: Home / Self Care | Payer: Medicare HMO | Attending: Emergency Medicine | Admitting: Emergency Medicine

## 2022-01-01 ENCOUNTER — Other Ambulatory Visit: Payer: Self-pay

## 2022-01-01 DIAGNOSIS — N390 Urinary tract infection, site not specified: Secondary | ICD-10-CM | POA: Diagnosis not present

## 2022-01-01 DIAGNOSIS — R0602 Shortness of breath: Secondary | ICD-10-CM | POA: Insufficient documentation

## 2022-01-01 DIAGNOSIS — R0789 Other chest pain: Secondary | ICD-10-CM | POA: Diagnosis not present

## 2022-01-01 DIAGNOSIS — R079 Chest pain, unspecified: Secondary | ICD-10-CM | POA: Diagnosis not present

## 2022-01-01 DIAGNOSIS — M7989 Other specified soft tissue disorders: Secondary | ICD-10-CM | POA: Diagnosis not present

## 2022-01-01 DIAGNOSIS — M79662 Pain in left lower leg: Secondary | ICD-10-CM | POA: Diagnosis not present

## 2022-01-01 DIAGNOSIS — M79661 Pain in right lower leg: Secondary | ICD-10-CM | POA: Diagnosis not present

## 2022-01-01 LAB — CBC
HCT: 40.8 % (ref 36.0–46.0)
Hemoglobin: 13.2 g/dL (ref 12.0–15.0)
MCH: 29.3 pg (ref 26.0–34.0)
MCHC: 32.4 g/dL (ref 30.0–36.0)
MCV: 90.7 fL (ref 80.0–100.0)
Platelets: 210 10*3/uL (ref 150–400)
RBC: 4.5 MIL/uL (ref 3.87–5.11)
RDW: 13.2 % (ref 11.5–15.5)
WBC: 7.5 10*3/uL (ref 4.0–10.5)
nRBC: 0 % (ref 0.0–0.2)

## 2022-01-01 LAB — URINALYSIS, ROUTINE W REFLEX MICROSCOPIC
Bacteria, UA: NONE SEEN
Bilirubin Urine: NEGATIVE
Glucose, UA: NEGATIVE mg/dL
Ketones, ur: NEGATIVE mg/dL
Nitrite: NEGATIVE
Protein, ur: NEGATIVE mg/dL
Specific Gravity, Urine: 1.003 — ABNORMAL LOW (ref 1.005–1.030)
WBC, UA: >50 WBC/hpf — ABNORMAL HIGH (ref 0–5)
pH: 7 (ref 5.0–8.0)

## 2022-01-01 LAB — BASIC METABOLIC PANEL
Anion gap: 9 (ref 5–15)
BUN: 9 mg/dL (ref 8–23)
CO2: 27 mmol/L (ref 22–32)
Calcium: 10 mg/dL (ref 8.9–10.3)
Chloride: 105 mmol/L (ref 98–111)
Creatinine, Ser: 0.88 mg/dL (ref 0.44–1.00)
GFR, Estimated: >60 mL/min (ref >60)
Glucose, Bld: 110 mg/dL — ABNORMAL HIGH (ref 70–99)
Potassium: 4.1 mmol/L (ref 3.5–5.1)
Sodium: 141 mmol/L (ref 135–145)

## 2022-01-01 LAB — TROPONIN I (HIGH SENSITIVITY)
Troponin I (High Sensitivity): 3 ng/L (ref <18)
Troponin I (High Sensitivity): <2 ng/L (ref <18)

## 2022-01-01 LAB — D-DIMER, QUANTITATIVE: D-Dimer, Quant: 0.52 ug/mL-FEU — ABNORMAL HIGH (ref 0.00–0.50)

## 2022-01-01 MED ORDER — CEFDINIR 300 MG PO CAPS: 300.0000 mg | ORAL_CAPSULE | Freq: Two times a day (BID) | ORAL | 0 refills | Status: AC

## 2022-01-01 MED ORDER — SODIUM CHLORIDE 0.9 % IV SOLN
2.0000 g | Freq: Once | INTRAVENOUS | Status: AC
  Administered 2022-01-01: 2 g via INTRAVENOUS
  Filled 2022-01-01: qty 20

## 2022-01-01 MED ORDER — LIDOCAINE 5 % EX PTCH
1.0000 | MEDICATED_PATCH | Freq: Once | CUTANEOUS | Status: DC
  Administered 2022-01-01: 1 via TRANSDERMAL
  Filled 2022-01-01: qty 1

## 2022-01-01 MED ORDER — TECHNETIUM TO 99M ALBUMIN AGGREGATED
3.9500 | Freq: Once | INTRAVENOUS | Status: AC | PRN
  Administered 2022-01-01: 3.95 via INTRAVENOUS

## 2022-01-01 NOTE — ED Triage Notes (Addendum)
Patient c/o left sided chest pain that radiates across chest X2 days. Patient has left knee surgery in September 2023.

## 2022-01-01 NOTE — ED Provider Notes (Signed)
[facility] Provider Note    Event Date/Time   First MD Initiated Contact with Patient 01/01/22 1219     (approximate)   History   Chest Pain   HPI  Jean Gonzales is a 66 y.o. female with diabetes and history of left knee surgery in September 2023 who comes in with concerns for chest pain.  Patient reports intermittent stabbing chest pain to the left side of her chest this mostly when she tries to sit up.  She reports a little bit of shortness of breath as well as pain with breathing associated with it.  She denies any significant pain at this time about a 2 out of 10.  She denies any heart surgeries before.  It appears that patient had similar symptoms back when she had her other knee replacement but ultrasounds were negative for DVT and VQ scan was negative.  She followed up with cardiology Dr. Humphrey Rolls.  Patient denies any falls hitting her head.  Denies any abdominal pain.   Physical Exam   Triage Vital Signs: ED Triage Vitals [01/01/22 1019]  Enc Vitals Group     BP (!) 142/94     Pulse Rate 77     Resp 18     Temp 97.8 F (36.6 C)     Temp Source Oral     SpO2 100 %     Weight 187 lb (84.8 kg)     Height 5' 7.5" (1.715 m)     Head Circumference      Peak Flow      Pain Score 10     Pain Loc      Pain Edu?      Excl. in [place]?     Most recent vital signs: Vitals:   01/01/22 1019  BP: (!) 142/94  Pulse: 77  Resp: 18  Temp: 97.8 F (36.6 C)  SpO2: 100%     General: Awake, no distress.  CV:  Good peripheral perfusion.  No rash noted on the chest wall.  Some slight tenderness with palpation on the left chest wall Resp:  Normal effort.  Abd:  No distention.  Other:  Patient is swelling noted in the left leg greater than the right leg.  She is got 2+ distal pulse.   ED Results / Procedures / Treatments   Labs (all labs ordered are listed, but only abnormal results are displayed) Labs Reviewed  BASIC METABOLIC PANEL - Abnormal;  Notable for the following components:      Result Value   Glucose, Bld 110 (*)    All other components within normal limits  CBC  TROPONIN I (HIGH SENSITIVITY)  TROPONIN I (HIGH SENSITIVITY)     EKG  My interpretation of EKG:  Normal sinus rhythm 75 without any ST elevation or T wave inversions, normal intervals  RADIOLOGY I have reviewed the xray personally and interpreted no evidence of any pneumonia   PROCEDURES:  Critical Care performed: No  .1-3 Lead EKG Interpretation  Performed by: Sarahgrace Anselmo, MD Authorized by: Andres Northern Camb[place], MD     Interpretation: normal     ECG rate:  77   ECG rate assessment: normal     Rhythm: sinus rhythm     Ectopy: none     Conduction: normal      MEDICATIONS ORDERED IN ED: Medications  lidocaine (LIDODERM) 5 % 1 patch (1 patch Transdermal Patch Applied 01/01/22 1252)     IMPRESSION / MDM /  ASSESSMENT AND PLAN / ED COURSE  I reviewed the triage vital signs and the nursing notes.   Patient's presentation is most consistent with acute presentation with potential threat to life or bodily function.   Patient comes in with some intermittent stabbing chest pain.  Patient is high risk for PE.  Patient cannot get contrast due to contrast allergy.  Labs ordered evaluate for ACS, ultrasound to evaluate for DVT and will get VQ scan to evaluate for pulmonary embolism.  D-dimer was slightly elevated.  Troponins are negative x2.  Abdomen was soft and nontender low suspicion for abdominal process.  Patient did report some urinary symptoms and was requesting [place] to test her urine.  Does look positive for UTI so we will send for culture.  We will give a dose of ceftriaxone.  We will treat with some cefdinir.  Patient had off to oncoming team pending ultrasounds and VQ scan      FINAL CLINICAL IMPRESSION(S) / ED DIAGNOSES   Final diagnoses:  Atypical chest pain  Urinary tract infection without hematuria, site unspecified     Rx / DC  Orders   ED Discharge Orders          Ordered    cefdinir (OMNICEF) 300 MG capsule  2 times daily        01/01/22 1414             Note:  This document was prepared using Dragon voice recognition software and may include unintentional dictation errors.   Avamarie [place], MD 01/01/22 1415

## 2022-01-01 NOTE — ED Notes (Signed)
Pt tx to [facility] for VQ scan

## 2022-01-01 NOTE — ED Provider Notes (Signed)
Per established plan will discharge the patient as her pulmonary perfusion scan is negative.  Patient has a prescription for UTI her troponins are negative her EKG was stable and she will follow-up with her cardiologist.   Nena Polio, MD 01/01/22 1549

## 2022-01-01 NOTE — Discharge Instructions (Addendum)
We are treating you for UTI.  Your work-up was reassuring without any evidence of blood clots.  Please call your cardiologist to make a follow-up appointment and return to the ER if you develop worsening symptoms or any other concerns.  There is no sign of blood clots.

## 2022-01-04 ENCOUNTER — Encounter: Payer: Self-pay | Admitting: Family Medicine

## 2022-01-04 ENCOUNTER — Other Ambulatory Visit: Payer: Self-pay | Admitting: Family Medicine

## 2022-01-04 DIAGNOSIS — N644 Mastodynia: Secondary | ICD-10-CM

## 2022-01-04 DIAGNOSIS — Z1231 Encounter for screening mammogram for malignant neoplasm of breast: Secondary | ICD-10-CM

## 2022-01-04 LAB — URINE CULTURE: Culture: 70000 — AB

## 2022-01-05 NOTE — Progress Notes (Signed)
ED Antimicrobial Stewardship Positive Culture Follow Up   Jean Gonzales is an 66 y.o. female who presented to [facility] on 01/01/2022 with a chief complaint of radiating left-sided chest pain. Pt experiencing urinary symptoms and urinary culture growing 70,000 CFU/mL Enterobacter cloacae, and susceptibilities have returned showing resistance to cefazolin. Pt was prescribed cefdinir 300 mg BID x 7 days at discharge.  Called number on file three times and unable to reach patient. No voicemail left. Will attempt to reach patient again tomorrow regarding culture mismatch with discharge antibiotic. Plan was to prescribe ciprofloxacin 250 mg BID x 3 days if patient is continuing to experience urinary symptoms. Discussed plan with ED MD and got approval to send prescription pending discussion with patient.  Chief Complaint  Patient presents with   Chest Pain    Recent Results (from the past 720 hour(s))  Urine Culture     Status: Abnormal   Collection Time: 01/01/22 12:42 PM   Specimen: Urine, Clean Catch  Result Value Ref Range Status   Specimen Description   Final    URINE, CLEAN CATCH Performed at [facility], [address] [place], [place] [address]    Special Requests   Final    NONE Performed at [facility], [address] [place], [place] [address]    Culture 70,000 COLONIES/mL ENTEROBACTER CLOACAE (A)  Final   Report Status 01/04/2022 FINAL  Final   Organism ID, Bacteria ENTEROBACTER CLOACAE (A)  Final      Susceptibility   Enterobacter cloacae - MIC*    CEFAZOLIN >=64 RESISTANT Resistant     CEFEPIME <=0.12 SENSITIVE Sensitive     CIPROFLOXACIN <=0.25 SENSITIVE Sensitive     GENTAMICIN <=1 SENSITIVE Sensitive     IMIPENEM 1 SENSITIVE Sensitive     NITROFURANTOIN 64 INTERMEDIATE Intermediate     TRIMETH/SULFA <=20 SENSITIVE Sensitive     PIP/TAZO 64 INTERMEDIATE Intermediate     * 70,000 COLONIES/mL ENTEROBACTER CLOACAE    '[x]'$  Treated with  cefdinir 300 mg BID x 7 days, organism resistant to prescribed antimicrobial '[]'$  Patient discharged originally without antimicrobial agent and treatment is now indicated  New antibiotic prescription: ciprofloxacin 250 mg BID x 3 days pending discussion with patient  ED Provider: Dr. Andres Ege, PharmD PGY-1 Pharmacy Resident 01/05/2022 5:35 PM

## 2022-01-06 ENCOUNTER — Telehealth: Payer: Self-pay

## 2022-01-06 NOTE — Telephone Encounter (Signed)
        Patient  visited [place] on 10/8   Telephone encounter attempt :  1st  A HIPAA compliant voice message was left requesting a return call.  Instructed patient to call back    [place], [place] [facility]  [phone] [address], [place], [place] [address] Phone: [phone] Email: [email or web address]

## 2022-01-13 ENCOUNTER — Telehealth: Payer: Self-pay | Admitting: Family Medicine

## 2022-01-13 NOTE — Telephone Encounter (Signed)
Requested Prescriptions  Pending Prescriptions Disp Refills  . Semaglutide, 2 MG/DOSE, (OZEMPIC, 2 MG/DOSE,) 8 MG/3ML SOPN [Pharmacy Med Name: OZEMPIC 2 MG/DOSE (8 MG/3 ML)] 9 mL 0    Sig: Inject 2 mg into the skin once a week. On Sundays     Endocrinology:  Diabetes - GLP-1 Receptor Agonists - semaglutide Passed - 01/13/2022  6:23 AM      Passed - HBA1C in normal range and within 180 days    Hgb A1c MFr Bld  Date Value Ref Range Status  12/22/2021 5.6 4.8 - 5.6 % Final    Comment:             Prediabetes: 5.7 - 6.4          Diabetes: >6.4          Glycemic control for adults with diabetes: <7.0          Passed - Cr in normal range and within 360 days    Creatinine, Ser  Date Value Ref Range Status  01/01/2022 0.88 0.44 - 1.00 mg/dL Final   Creatinine, POC  Date Value Ref Range Status  04/11/2017 NA mg/dL Final         Passed - Valid encounter within last 6 months    Recent Outpatient Visits          3 weeks ago Type 2 diabetes mellitus with diabetic polyneuropathy, without long-term current use of insulin [facility])   [facility] Tally Joe T, FNP   3 months ago Type 2 diabetes mellitus with diabetic polyneuropathy, without long-term current use of insulin [facility])   [facility] Tally Joe T, FNP   8 months ago Hypertension associated with diabetes [facility])   [facility] Tally Joe T, FNP   11 months ago Type 2 diabetes mellitus with diabetic polyneuropathy, without long-term current use of insulin [facility])   [facility] Tally Joe T, FNP   1 year ago Type 2 diabetes mellitus with diabetic polyneuropathy, without long-term current use of insulin [facility])   [facility] Bacigalupo, Dionne Bucy, MD      Future Appointments            In 4 months MacDiarmid, Nicki Reaper, [place]

## 2022-01-17 NOTE — Telephone Encounter (Signed)
Santiago Glad from [place], called in about Semaglutide, 2 MG/DOSE for patient, says none of the phamacies have the the dosage for this, so she is asking for a different strength

## 2022-01-18 ENCOUNTER — Other Ambulatory Visit: Payer: Self-pay | Admitting: Family Medicine

## 2022-01-18 DIAGNOSIS — R339 Retention of urine, unspecified: Secondary | ICD-10-CM | POA: Diagnosis not present

## 2022-01-18 DIAGNOSIS — R32 Unspecified urinary incontinence: Secondary | ICD-10-CM | POA: Diagnosis not present

## 2022-01-18 DIAGNOSIS — E1142 Type 2 diabetes mellitus with diabetic polyneuropathy: Secondary | ICD-10-CM

## 2022-01-18 MED ORDER — SEMAGLUTIDE (1 MG/DOSE) 4 MG/3ML ~~LOC~~ SOPN: 2.0000 mg | PEN_INJECTOR | SUBCUTANEOUS | 0 refills | Status: DC

## 2022-01-19 ENCOUNTER — Telehealth: Payer: Self-pay

## 2022-01-19 NOTE — Telephone Encounter (Signed)
Copied from [place] [id number]. Topic: General - Other >> Jan 19, 2022  3:11 PM Ja-Kwan M wrote: Reason for CRM: Pt stated she received a message telling her that the Rx for Semaglutide, 1 MG/DOSE, 4 MG/3ML SOPN was approved but the message did not advise where the Rx was sent. Informed pt of the pharmacy where the Rx was sent but she stated she spoke with the pharmacy a few days ago and she was told they did not have the Rx in stock. Pt requests that Tally Joe return her call.

## 2022-01-19 NOTE — Telephone Encounter (Signed)
-----   Message from [name], FNP sent at 01/19/2022  8:49 AM EDT ----- See if she can get the 1 mg; was sent and approved  ----- Message ----- From: Truitt Merle Sent: 01/19/2022   8:39 AM EDT To: [name], FNP  Review incoming fax

## 2022-01-19 NOTE — Telephone Encounter (Signed)
Spoke with patient.

## 2022-02-13 DIAGNOSIS — M1712 Unilateral primary osteoarthritis, left knee: Secondary | ICD-10-CM | POA: Diagnosis not present

## 2022-02-13 DIAGNOSIS — Z9889 Other specified postprocedural states: Secondary | ICD-10-CM | POA: Diagnosis not present

## 2022-03-02 ENCOUNTER — Encounter: Payer: Self-pay | Admitting: Family Medicine

## 2022-03-08 DIAGNOSIS — S92511A Displaced fracture of proximal phalanx of right lesser toe(s), initial encounter for closed fracture: Secondary | ICD-10-CM | POA: Diagnosis not present

## 2022-03-08 DIAGNOSIS — M79671 Pain in right foot: Secondary | ICD-10-CM | POA: Diagnosis not present

## 2022-03-14 DIAGNOSIS — R32 Unspecified urinary incontinence: Secondary | ICD-10-CM | POA: Diagnosis not present

## 2022-03-14 DIAGNOSIS — C541 Malignant neoplasm of endometrium: Secondary | ICD-10-CM | POA: Diagnosis not present

## 2022-03-14 DIAGNOSIS — R339 Retention of urine, unspecified: Secondary | ICD-10-CM | POA: Diagnosis not present

## 2022-03-17 ENCOUNTER — Other Ambulatory Visit: Payer: Self-pay | Admitting: Family Medicine

## 2022-03-17 DIAGNOSIS — K2100 Gastro-esophageal reflux disease with esophagitis, without bleeding: Secondary | ICD-10-CM

## 2022-03-17 NOTE — Telephone Encounter (Signed)
Requested Prescriptions  Pending Prescriptions Disp Refills   esomeprazole (NEXIUM) 40 MG capsule [Pharmacy Med Name: ESOMEPRAZOLE MAG DR 40 MG CAP] 90 capsule 1    Sig: TAKE 1 CAPSULE BY MOUTH DAILY     Gastroenterology: Proton Pump Inhibitors 2 Passed - 03/17/2022  6:24 AM      Passed - ALT in normal range and within 360 days    ALT  Date Value Ref Range Status  12/22/2021 12 0 - 32 IU/L Final         Passed - AST in normal range and within 360 days    AST  Date Value Ref Range Status  12/22/2021 15 0 - 40 IU/L Final         Passed - Valid encounter within last 12 months    Recent Outpatient Visits           2 months ago Type 2 diabetes mellitus with diabetic polyneuropathy, without long-term current use of insulin [facility])   [facility] Tally Joe T, FNP   5 months ago Type 2 diabetes mellitus with diabetic polyneuropathy, without long-term current use of insulin [facility])   [facility] Tally Joe T, FNP   10 months ago Hypertension associated with diabetes [facility])   [facility] Tally Joe T, FNP   1 year ago Type 2 diabetes mellitus with diabetic polyneuropathy, without long-term current use of insulin [facility])   [facility] Tally Joe T, FNP   1 year ago Type 2 diabetes mellitus with diabetic polyneuropathy, without long-term current use of insulin [facility])   [facility] Bacigalupo, Dionne Bucy, MD       Future Appointments             In 2 months MacDiarmid, Nicki Reaper, [place]

## 2022-03-20 ENCOUNTER — Encounter: Payer: Self-pay | Admitting: Family Medicine

## 2022-03-22 DIAGNOSIS — S92511A Displaced fracture of proximal phalanx of right lesser toe(s), initial encounter for closed fracture: Secondary | ICD-10-CM | POA: Diagnosis not present

## 2022-03-31 ENCOUNTER — Other Ambulatory Visit: Payer: Self-pay | Admitting: Family Medicine

## 2022-04-17 DIAGNOSIS — S92511A Displaced fracture of proximal phalanx of right lesser toe(s), initial encounter for closed fracture: Secondary | ICD-10-CM | POA: Diagnosis not present

## 2022-04-21 DIAGNOSIS — R339 Retention of urine, unspecified: Secondary | ICD-10-CM | POA: Diagnosis not present

## 2022-04-21 DIAGNOSIS — R32 Unspecified urinary incontinence: Secondary | ICD-10-CM | POA: Diagnosis not present

## 2022-04-21 DIAGNOSIS — C541 Malignant neoplasm of endometrium: Secondary | ICD-10-CM | POA: Diagnosis not present

## 2022-05-15 ENCOUNTER — Other Ambulatory Visit: Payer: Self-pay | Admitting: Family Medicine

## 2022-05-15 DIAGNOSIS — Z1231 Encounter for screening mammogram for malignant neoplasm of breast: Secondary | ICD-10-CM

## 2022-05-18 ENCOUNTER — Other Ambulatory Visit: Payer: Self-pay | Admitting: Family Medicine

## 2022-05-18 DIAGNOSIS — N9989 Other postprocedural complications and disorders of genitourinary system: Secondary | ICD-10-CM

## 2022-05-22 NOTE — Progress Notes (Unsigned)
I,Emoni Whitworth R Analysa Nutting,acting as a Education administrator for [name], FNP.,have documented all relevant documentation on the behalf of [name], FNP,as directed by  [name], FNP while in the presence of [name], FNP.  Annual Wellness Visit  Patient: Jean Gonzales, Female    DOB: [birth year], 67 y.o.   MRN: [id number] Visit Date: 05/23/2022  Today's Provider: [name], FNP  Re Introduced to nurse practitioner role and practice setting.  All questions answered.  Discussed provider/patient relationship and expectations.  Chief Complaint  Patient presents with   Annual Exam   Subjective    Jean Gonzales is a 67 y.o. female who presents today for her Annual Wellness Visit. She reports consuming a general diet. Home exercise routine includes walking .25 hrs per days. She generally feels well. She reports sleeping well. She does not have additional problems to discuss today.   HPI  Medications: Outpatient Medications Prior to Visit  Medication Sig   acetaminophen (TYLENOL) 325 MG tablet Take 1-2 tablets (325-650 mg total) by mouth every 6 (six) hours as needed for mild pain (pain score 1-3 or temp > 100.5).   Blood Glucose Monitoring Suppl (ONE TOUCH ULTRA 2) w/Device KIT Use as directed   celecoxib (CELEBREX) 100 MG capsule Take 1 capsule (100 mg total) by mouth 2 (two) times daily.   diclofenac sodium (VOLTAREN) 1 % GEL Apply 2 g topically 4 (four) times daily. Apply to knee   diclofenac Sodium (VOLTAREN) 1 % GEL Apply 4 g topically 4 (four) times daily.   docusate sodium (COLACE) 100 MG capsule Take 1 capsule (100 mg total) by mouth daily as needed.   esomeprazole (NEXIUM) 40 MG capsule TAKE 1 CAPSULE BY MOUTH DAILY   fenofibrate (TRICOR) 145 MG tablet Take 1 tablet (145 mg total) by mouth daily. (Patient taking differently: Take 145 mg by mouth every evening.)   glucose blood (ONETOUCH VERIO) test strip Use as instructed   HYDROcodone-acetaminophen (NORCO/VICODIN)  5-325 MG tablet Take 1 tablet by mouth every 4 (four) hours as needed for moderate pain.   nystatin (MYCOSTATIN/NYSTOP) powder Apply topically 3 (three) times daily. (Patient taking differently: Apply 1 Application topically daily as needed.)   OneTouch Delica Lancets 99991111 MISC Use as directed   OVER THE COUNTER MEDICATION Take 1 tablet by mouth 2 (two) times daily. UQORA DEFEND   OVER THE COUNTER MEDICATION Take 1 tablet by mouth 2 (two) times daily. VIVISCAL   rosuvastatin (CRESTOR) 20 MG tablet Take 1 tablet (20 mg total) by mouth daily. (Patient taking differently: Take 20 mg by mouth every morning.)   Semaglutide, 1 MG/DOSE, 4 MG/3ML SOPN Inject 2 mg into the skin once a week.   tamsulosin (FLOMAX) 0.4 MG CAPS capsule TAKE ONE CAPSULE BY MOUTH DAILY   triamcinolone ointment (KENALOG) 0.1 % APPLY TO AFFECTED AREA TWICE A DAY (Patient taking differently: 1 Application as needed.)   No facility-administered medications prior to visit.    Allergies  Allergen Reactions   Iodinated Contrast Media Anaphylaxis   Bactrim [Sulfamethoxazole-Trimethoprim] Itching and Rash    Patient Care Team: [name], FNP as PCP - General (Family Medicine) Kate Sable, MD as PCP - Cardiology (Cardiology) Neldon Labella, RN as Case Manager  Review of Systems      Objective    Vitals: BP 100/70 Comment: home  Pulse 90   Temp 98 F (36.7 C) (Oral)   Ht 5' 7.5" (1.715 m)   Wt 186  lb 1.6 oz (84.4 kg)   SpO2 99%   BMI 28.72 kg/m     Physical Exam Vitals and nursing note reviewed.  Constitutional:      General: She is awake. She is not in acute distress.    Appearance: Normal appearance. She is well-developed, well-groomed and normal weight. She is not ill-appearing, toxic-appearing or diaphoretic.  HENT:     Head: Normocephalic and atraumatic.     Jaw: There is normal jaw occlusion. No trismus, tenderness, swelling or pain on movement.     Right Ear: Hearing, tympanic membrane, ear  canal and external ear normal. There is no impacted cerumen.     Left Ear: Hearing, tympanic membrane, ear canal and external ear normal. There is no impacted cerumen.     Nose: Nose normal. No congestion or rhinorrhea.     Right Turbinates: Not enlarged, swollen or pale.     Left Turbinates: Not enlarged, swollen or pale.     Right Sinus: No maxillary sinus tenderness or frontal sinus tenderness.     Left Sinus: No maxillary sinus tenderness or frontal sinus tenderness.     Mouth/Throat:     Lips: Pink.     Mouth: Mucous membranes are moist. No injury.     Tongue: No lesions.     Pharynx: Oropharynx is clear. Uvula midline. No pharyngeal swelling, oropharyngeal exudate, posterior oropharyngeal erythema or uvula swelling.     Tonsils: No tonsillar exudate or tonsillar abscesses.  Eyes:     General: Lids are normal. Lids are everted, no foreign bodies appreciated. Vision grossly intact. Gaze aligned appropriately. No allergic shiner or visual field deficit.       Right eye: No discharge.        Left eye: No discharge.     Extraocular Movements: Extraocular movements intact.     Conjunctiva/sclera: Conjunctivae normal.     Right eye: Right conjunctiva is not injected. No exudate.    Left eye: Left conjunctiva is not injected. No exudate.    Pupils: Pupils are equal, round, and reactive to light.  Neck:     Thyroid: No thyroid mass, thyromegaly or thyroid tenderness.     Vascular: No carotid bruit.     Trachea: Trachea normal.  Cardiovascular:     Rate and Rhythm: Normal rate and regular rhythm.     Pulses: Normal pulses.          Carotid pulses are 2+ on the right side and 2+ on the left side.      Radial pulses are 2+ on the right side and 2+ on the left side.       Dorsalis pedis pulses are 2+ on the right side and 2+ on the left side.       Posterior tibial pulses are 2+ on the right side and 2+ on the left side.     Heart sounds: Normal heart sounds, S1 normal and S2 normal. No  murmur heard.    No friction rub. No gallop.  Pulmonary:     Effort: Pulmonary effort is normal. No respiratory distress.     Breath sounds: Normal breath sounds and air entry. No stridor. No wheezing, rhonchi or rales.  Chest:     Chest wall: No tenderness.  Abdominal:     General: Abdomen is flat. Bowel sounds are normal. There is no distension.     Palpations: Abdomen is soft. There is no mass.     Tenderness: There is no abdominal tenderness.  There is no right CVA tenderness, left CVA tenderness, guarding or rebound.     Hernia: No hernia is present.  Genitourinary:    Comments: Exam deferred; denies complaints Musculoskeletal:        General: No swelling, tenderness, deformity or signs of injury. Normal range of motion.     Cervical back: Full passive range of motion without pain, normal range of motion and neck supple. No edema, rigidity or tenderness. No muscular tenderness.     Right lower leg: No edema.     Left lower leg: No edema.  Lymphadenopathy:     Cervical: No cervical adenopathy.     Right cervical: No superficial, deep or posterior cervical adenopathy.    Left cervical: No superficial, deep or posterior cervical adenopathy.  Skin:    General: Skin is warm and dry.     Capillary Refill: Capillary refill takes less than 2 seconds.     Coloration: Skin is not jaundiced or pale.     Findings: No bruising, erythema, lesion or rash.  Neurological:     General: No focal deficit present.     Mental Status: She is alert and oriented to person, place, and time. Mental status is at baseline.     GCS: GCS eye subscore is 4. GCS verbal subscore is 5. GCS motor subscore is 6.     Sensory: Sensation is intact. No sensory deficit.     Motor: Motor function is intact. No weakness.     Coordination: Coordination is intact. Coordination normal.     Gait: Gait is intact. Gait normal.  Psychiatric:        Attention and Perception: Attention and perception normal.        Mood and  Affect: Mood and affect normal.        Speech: Speech normal.        Behavior: Behavior normal. Behavior is cooperative.        Thought Content: Thought content normal.        Cognition and Memory: Cognition and memory normal.        Judgment: Judgment normal.    Most recent functional status assessment:    05/23/2022    2:40 PM  In your present state of health, do you have any difficulty performing the following activities:  Hearing? 0  Vision? 0  Difficulty concentrating or making decisions? 0  Walking or climbing stairs? 1  Dressing or bathing? 0  Doing errands, shopping? 1   Most recent fall risk assessment:    05/23/2022    2:40 PM  [place] in the past year? 0  Number falls in past yr: 0  Injury with Fall? 0    Most recent depression screenings:    05/23/2022    2:40 PM 12/22/2021    1:57 PM  PHQ 2/9 Scores  PHQ - 2 Score 0 0  PHQ- 9 Score 0 0   Most recent cognitive screening:    12/22/2021    2:11 PM  6CIT Screen  What Year? 0 points  What month? 0 points  What time? 0 points  Count back from 20 0 points  Months in reverse 0 points  Repeat phrase 0 points  Total Score 0 points   Most recent Audit-C alcohol use screening    05/23/2022    2:40 PM  Alcohol Use Disorder Test (AUDIT)  1. How often do you have a drink containing alcohol? 0  2. How many drinks containing  alcohol do you have on a typical day when you are drinking? 0  3. How often do you have six or more drinks on one occasion? 0  AUDIT-C Score 0   A score of 3 or more in women, and 4 or more in men indicates increased risk for alcohol abuse, EXCEPT if all of the points are from question 1   No results found for any visits on 05/23/22.  Assessment & Plan     Annual wellness visit done today including the all of the following: Reviewed patient's Family Medical History Reviewed and updated list of patient's medical providers Assessment of cognitive impairment was done Assessed  patient's functional ability Established a written schedule for health screening [place] Completed and Reviewed  Exercise Activities and Dietary recommendations  Goals      Chronic Care Management     CARE PLAN ENTRY (see longitudinal plan of care for additional care plan information)  Current Barriers:  Chronic Disease Management support and education needs related to Diabetes, HTN, HLD and Rheumatoid Arthritis. (Hx of Endometrial Ca)   Case Manager Clinical Goal(s):  Over the next 120 days, patient: Will not require hospitalization or emergent care d/t complications r/t chronic illnesses. Over the next 90 days, patient: Will attend all scheduled medical appointments. Will take all medications as prescribed. Monitor BP and record readings. Continue compliance with recommended heart healthy/diabetic diet.  Follow recommended safety measures to prevent falls and injuries. Follow recommended aseptic self-catheterization technique to prevent infections.    Interventions:  Inter-disciplinary care team collaboration (see longitudinal plan of care) Reviewed medications. Encouraged to take medications as prescribed and notify provider if unable to tolerate prescribed regimen. Encouraged to notify care management team with concerns regarding medication management or prescription cost. Reports taking medications as prescribed. Currently working with her insurance provider and pending feedback regarding Enbrel injections. Reports estimated costs of $2500/month. She anticipates receiving medication assistance. Agreed to update the care management team if additional assistance is needed from the CCM Pharmacist.   Provided information regarding established BP parameters and indications for notifying a provider. Encouraged to monitor routinely if unable to monitor daily and record readings.  Reviewed nutritional intake and diet recommendations. Reports doing well with intake  and attempting to follow a modified keto diet. Encouraged to continue reading nutrition labels and follow diet as advised by her provider. Encouraged to update care management team if additional nutritional resources are needed.  Discussed recommended safety measures to prevent falls and injuries. Encouraged to use assistive device as needed when ambulating. Encouraged to keep pathways clear and well lit. Currently using walker when ambulating. Reports mobility is significantly limited d/t Rheumatoid Arthritis. She is aware of activities to avoid to prevent accidental falls.   Discussed infection prevention measures r/t intermittent self-catheterization. Encouraged to utilize recommended aseptic techniques for each catheterization. Thoroughly discusses s/sx of infection and indications for seeking medical attention. Reports performing self catheterization approximately three times a day. She is very knowledgeable of reportable symptoms of infection. Currently receiving supplies every three months. Agreed to update the care management team if this changes and she requires assistance with obtaining supplies.   Reviewed scheduled/upcoming provider appointments. Encouraged to attend medical appointments as scheduled to prevent delays in care. Encouraged to notify the care management team with concerns regarding transportation.  Discussed plans for ongoing care management follow up. Provided direct contact information for the CCM Nurse Case Manager. Reports doing well in the home. She lives alone but  states family members are close by and available to assist as needed. Denies urgent care management needs. Agreed to outreach again in two months. Will provide an update if a referral for CCM Pharmacy outreach is needed.   Patient Self Care Activities:  Self administers medications  Attends scheduled provider appointments Calls pharmacy for medication refills Performs ADL's independently Calls provider office  for new concerns or questions   Initial goal documentation      DIET - EAT MORE FRUITS AND VEGETABLES        Immunization History  Administered Date(s) Administered   Fluad Quad(high Dose 65+) 02/16/2021   Influenza,inj,Quad PF,6+ Mos 12/18/2017   PFIZER(Purple Top)SARS-COV-2 Vaccination 09/24/2019, 10/15/2019, 04/09/2020   PNEUMOCOCCAL CONJUGATE-20 08/12/2020   Pneumococcal Polysaccharide-23 12/05/2010    Health Maintenance  Topic Date Due   DTaP/Tdap/Td (1 - Tdap) Never done   Zoster Vaccines- Shingrix (1 of 2) Never done   COVID-19 Vaccine (4 - 2023-24 season) 11/25/2021   OPHTHALMOLOGY EXAM  12/20/2021   INFLUENZA VACCINE  06/25/2022 (Originally 10/25/2021)   HEMOGLOBIN A1C  06/22/2022   Diabetic kidney evaluation - Urine ACR  09/20/2022   Medicare Annual Wellness (AWV)  12/23/2022   Diabetic kidney evaluation - eGFR measurement  01/02/2023   MAMMOGRAM  02/12/2023   FOOT EXAM  05/24/2023   COLONOSCOPY (Pts 45-36yr Insurance coverage will need to be confirmed)  08/08/2026   Pneumonia Vaccine 67 Years old  Completed   DEXA SCAN  Completed   Hepatitis C Screening  Completed   HPV VACCINES  Aged Out   PAP SMEAR-Modifier  Discontinued   COLON CANCER SCREENING ANNUAL FOBT  Discontinued     Discussed health benefits of physical activity, and encouraged her to engage in regular exercise appropriate for her age and condition.    Problem List Items Addressed This Visit       Cardiovascular and Mediastinum   Angina pectoris (HCC)    Chronic, atypical CP Continues to have elevated BP in office; and lower Bps at home Continue to monitor in setting of celebrx 100 mg BID and use of topical voltaren QID PRN      Hypertension associated with diabetes (HCC)    Chronic, variable in setting of home vs clinic location Goal <130/<80 Repeat CBC and CMP Currently controlled with diet/exercise      Relevant Orders   Comprehensive Metabolic Panel (CMET)   Hemoglobin A1c    CBC   Urine Microalbumin w/creat. ratio     Endocrine   Hyperlipidemia associated with type 2 diabetes mellitus (HCC)    Chronic, previously stable despite elevated trigs Remains on tricor 145 mg as well as crestor 20 mg Repeat LP LDL goal <70      Relevant Orders   Hemoglobin A1c   Lipid panel   Urine Microalbumin w/creat. ratio   Type 2 diabetes mellitus with diabetic polyneuropathy, without long-term current use of insulin (HCC)    Chronic, well controlled Reports tingling in feet; however, normal monofilament  Continue ozempic at 2 mg weekly Repeat A1c Continue to recommend balanced, lower carb meals. Smaller meal size, adding snacks. Choosing water as drink of choice and increasing purposeful exercise.       Relevant Orders   Hemoglobin A1c   Urine Microalbumin w/creat. ratio     Musculoskeletal and Integument   Psoriatic arthritis (HCC)    Chronic, stable Some recent knee involvement; pt seen by ortho Continues OTC treatments, injections, and use of norco q4h PRN to assist  Also using celebrex 100 mg BID and topical voltaren QID PRN        Genitourinary   Urethral meatal stenosis    Chronic, stable Request for UA and Ucx to assist with refills of home cath supplies No urinary complaints at this time F/b Urology       Relevant Orders   Urinalysis, Routine w reflex microscopic   Urine Culture     Other   Annual physical exam - Primary    Mammo scheduled >65- defer PAP Due for dental/vision; encouraged to schedule Things to do to keep yourself healthy  - Exercise at least 30-45 minutes a day, 3-4 days a week.  - Eat a low-fat diet with lots of fruits and vegetables, up to 7-9 servings per day.  - Seatbelts can save your life. Wear them always.  - Smoke detectors on every level of your home, check batteries every year.  - Eye Doctor - have an eye exam every 1-2 years  - Safe sex - if you may be exposed to STDs, use a condom.  - Alcohol -  If you drink,  do it moderately, less than 2 drinks per day.  - [place]. Choose someone to speak for you if you are not able.  - Depression is common in our stressful world.If you're feeling down or losing interest in things you normally enjoy, please come in for a visit.  - Violence - If anyone is threatening or hurting you, please call immediately.       Return in about 6 months (around 11/21/2022) for chonic disease management.    Vonna Kotyk, FNP, have reviewed all documentation for this visit. The documentation on 05/23/22 for the exam, diagnosis, procedures, and orders are all accurate and complete.  [name], [place] [phone] (phone) [phone] (fax)  [place]

## 2022-05-23 ENCOUNTER — Encounter: Payer: Self-pay | Admitting: Family Medicine

## 2022-05-23 ENCOUNTER — Ambulatory Visit (INDEPENDENT_AMBULATORY_CARE_PROVIDER_SITE_OTHER): Payer: Medicare HMO | Admitting: Family Medicine

## 2022-05-23 VITALS — BP 100/70 | HR 90 | Temp 98.0°F | Ht 67.5 in | Wt 186.1 lb

## 2022-05-23 DIAGNOSIS — N3592 Unspecified urethral stricture, female: Secondary | ICD-10-CM | POA: Diagnosis not present

## 2022-05-23 DIAGNOSIS — E1169 Type 2 diabetes mellitus with other specified complication: Secondary | ICD-10-CM | POA: Diagnosis not present

## 2022-05-23 DIAGNOSIS — E1159 Type 2 diabetes mellitus with other circulatory complications: Secondary | ICD-10-CM | POA: Diagnosis not present

## 2022-05-23 DIAGNOSIS — E1142 Type 2 diabetes mellitus with diabetic polyneuropathy: Secondary | ICD-10-CM

## 2022-05-23 DIAGNOSIS — I152 Hypertension secondary to endocrine disorders: Secondary | ICD-10-CM

## 2022-05-23 DIAGNOSIS — L4050 Arthropathic psoriasis, unspecified: Secondary | ICD-10-CM

## 2022-05-23 DIAGNOSIS — I209 Angina pectoris, unspecified: Secondary | ICD-10-CM | POA: Diagnosis not present

## 2022-05-23 DIAGNOSIS — Z0000 Encounter for general adult medical examination without abnormal findings: Secondary | ICD-10-CM

## 2022-05-23 DIAGNOSIS — E785 Hyperlipidemia, unspecified: Secondary | ICD-10-CM

## 2022-05-23 NOTE — Patient Instructions (Addendum)
The CDC recommends two doses of Shingrix (the new shingles vaccine) separated by 2 to 6 months for adults age 69 years and older. I recommend checking with your insurance plan regarding coverage for this vaccine.    Schedule preventive vision and dental appts.

## 2022-05-23 NOTE — Assessment & Plan Note (Signed)
Chronic, stable Request for UA and Ucx to assist with refills of home cath supplies No urinary complaints at this time F/b Urology

## 2022-05-23 NOTE — Assessment & Plan Note (Signed)
Chronic, atypical CP Continues to have elevated BP in office; and lower Bps at home Continue to monitor in setting of celebrx 100 mg BID and use of topical voltaren QID PRN

## 2022-05-23 NOTE — Assessment & Plan Note (Signed)
Chronic, previously stable despite elevated trigs Remains on tricor 145 mg as well as crestor 20 mg Repeat LP LDL goal <70

## 2022-05-23 NOTE — Assessment & Plan Note (Signed)
Mammo scheduled >65- defer PAP Due for dental/vision; encouraged to schedule Things to do to keep yourself healthy  - Exercise at least 30-45 minutes a day, 3-4 days a week.  - Eat a low-fat diet with lots of fruits and vegetables, up to 7-9 servings per day.  - Seatbelts can save your life. Wear them always.  - Smoke detectors on every level of your home, check batteries every year.  - Eye Doctor - have an eye exam every 1-2 years  - Safe sex - if you may be exposed to STDs, use a condom.  - Alcohol -  If you drink, do it moderately, less than 2 drinks per day.  - Friend[place]. Choose someone to speak for you if you are not able.  - Depression is common in our stressful world.If you're feeling down or losing interest in things you normally enjoy, please come in for a visit.  - Violence - If anyone is threatening or hurting you, please call immediately.

## 2022-05-23 NOTE — Assessment & Plan Note (Signed)
Chronic, variable in setting of home vs clinic location Goal <130/<80 Repeat CBC and CMP Currently controlled with diet/exercise

## 2022-05-23 NOTE — Assessment & Plan Note (Signed)
Chronic, stable Some recent knee involvement; pt seen by ortho Continues OTC treatments, injections, and use of norco q4h PRN to assist  Also using celebrex 100 mg BID and topical voltaren QID PRN

## 2022-05-23 NOTE — Assessment & Plan Note (Signed)
Chronic, well controlled Reports tingling in feet; however, normal monofilament  Continue ozempic at 2 mg weekly Repeat A1c Continue to recommend balanced, lower carb meals. Smaller meal size, adding snacks. Choosing water as drink of choice and increasing purposeful exercise.

## 2022-05-24 DIAGNOSIS — R32 Unspecified urinary incontinence: Secondary | ICD-10-CM | POA: Diagnosis not present

## 2022-05-24 DIAGNOSIS — C541 Malignant neoplasm of endometrium: Secondary | ICD-10-CM | POA: Diagnosis not present

## 2022-05-24 DIAGNOSIS — R339 Retention of urine, unspecified: Secondary | ICD-10-CM | POA: Diagnosis not present

## 2022-05-26 ENCOUNTER — Other Ambulatory Visit: Payer: Self-pay | Admitting: Family Medicine

## 2022-05-26 DIAGNOSIS — R809 Proteinuria, unspecified: Secondary | ICD-10-CM

## 2022-05-26 LAB — COMPREHENSIVE METABOLIC PANEL
ALT: 12 IU/L (ref 0–32)
AST: 13 IU/L (ref 0–40)
Albumin/Globulin Ratio: 1.8 (ref 1.2–2.2)
Albumin: 4.6 g/dL (ref 3.9–4.9)
Alkaline Phosphatase: 83 IU/L (ref 44–121)
BUN/Creatinine Ratio: 12 (ref 12–28)
BUN: 10 mg/dL (ref 8–27)
Bilirubin Total: 0.6 mg/dL (ref 0.0–1.2)
CO2: 24 mmol/L (ref 20–29)
Calcium: 10.3 mg/dL (ref 8.7–10.3)
Chloride: 105 mmol/L (ref 96–106)
Creatinine, Ser: 0.84 mg/dL (ref 0.57–1.00)
Globulin, Total: 2.5 g/dL (ref 1.5–4.5)
Glucose: 82 mg/dL (ref 70–99)
Potassium: 3.7 mmol/L (ref 3.5–5.2)
Sodium: 145 mmol/L — ABNORMAL HIGH (ref 134–144)
Total Protein: 7.1 g/dL (ref 6.0–8.5)
eGFR: 76 mL/min/{1.73_m2} (ref >59)

## 2022-05-26 LAB — LIPID PANEL
Chol/HDL Ratio: 3.6 ratio (ref 0.0–4.4)
Cholesterol, Total: 160 mg/dL (ref 100–199)
HDL: 44 mg/dL (ref >39)
LDL Chol Calc (NIH): 79 mg/dL (ref 0–99)
Triglycerides: 226 mg/dL — ABNORMAL HIGH (ref 0–149)
VLDL Cholesterol Cal: 37 mg/dL (ref 5–40)

## 2022-05-26 LAB — CBC
Hematocrit: 41.4 % (ref 34.0–46.6)
Hemoglobin: 13.6 g/dL (ref 11.1–15.9)
MCH: 29.8 pg (ref 26.6–33.0)
MCHC: 32.9 g/dL (ref 31.5–35.7)
MCV: 91 fL (ref 79–97)
Platelets: 254 10*3/uL (ref 150–450)
RBC: 4.57 x10E6/uL (ref 3.77–5.28)
RDW: 13.1 % (ref 11.7–15.4)
WBC: 10 10*3/uL (ref 3.4–10.8)

## 2022-05-26 LAB — HEMOGLOBIN A1C
Est. average glucose Bld gHb Est-mCnc: 114 mg/dL
Hgb A1c MFr Bld: 5.6 % (ref 4.8–5.6)

## 2022-05-26 LAB — MICROSCOPIC EXAMINATION
Casts: NONE SEEN /lpf
RBC, Urine: NONE SEEN /hpf (ref 0–2)
WBC, UA: >30 /hpf — AB (ref 0–5)

## 2022-05-26 LAB — URINE CULTURE

## 2022-05-26 LAB — URINALYSIS, ROUTINE W REFLEX MICROSCOPIC
Bilirubin, UA: NEGATIVE
Glucose, UA: NEGATIVE
Ketones, UA: NEGATIVE
Nitrite, UA: POSITIVE — AB
Protein,UA: NEGATIVE
RBC, UA: NEGATIVE
Specific Gravity, UA: 1.009 (ref 1.005–1.030)
Urobilinogen, Ur: 0.2 mg/dL (ref 0.2–1.0)
pH, UA: 6 (ref 5.0–7.5)

## 2022-05-26 LAB — MICROALBUMIN / CREATININE URINE RATIO
Creatinine, Urine: 57 mg/dL
Microalb/Creat Ratio: 45 mg/g creat — ABNORMAL HIGH (ref 0–29)
Microalbumin, Urine: 25.6 ug/mL

## 2022-05-26 NOTE — Progress Notes (Signed)
Labs remain normal and stable; slight increase in cholesterol. I continue to recommend diet low in saturated fat and regular exercise - 30 min at least 5 times per week  Best goal for LDL is <70; which is where it was at previous LP check. The 10-year ASCVD risk score (Arnett DK, et al., 2019) is: 8.9%   Values used to calculate the score:     Age: 67 years     Sex: Female     Is Non-Hispanic African American: No     Diabetic: Yes     Tobacco smoker: No     Systolic Blood Pressure: 123XX123 mmHg     Is BP treated: No     HDL Cholesterol: 44 mg/dL     Total Cholesterol: 160 mg/dL  Urine micro remains elevated; however, improved. If desired can have consult with nephrology (kidney team) to r/o other causes.  A1c remains well controlled at 5.6%. Continue to recommend balanced, lower carb meals. Smaller meal size, adding snacks. Choosing water as drink of choice and increasing purposeful exercise.  Please let urology know urine labs are completed for your appt; preliminary positive on urine culture.  Please let us know if you have any questions.  Thank you, [name], [address] [place], [place] [address] [phone] (phone) [phone] (fax) [place]

## 2022-06-01 ENCOUNTER — Ambulatory Visit
Admission: RE | Admit: 2022-06-01 | Discharge: 2022-06-01 | Disposition: A | Discharge status: Home / Self Care | Payer: Medicare HMO | Source: Ambulatory Visit | Attending: Family Medicine | Admitting: Family Medicine

## 2022-06-01 DIAGNOSIS — Z1231 Encounter for screening mammogram for malignant neoplasm of breast: Secondary | ICD-10-CM

## 2022-06-05 ENCOUNTER — Encounter: Payer: Self-pay | Admitting: Urology

## 2022-06-05 ENCOUNTER — Ambulatory Visit (INDEPENDENT_AMBULATORY_CARE_PROVIDER_SITE_OTHER): Payer: Medicare HMO | Admitting: Urology

## 2022-06-05 VITALS — BP 122/87 | HR 81 | Ht 67.5 in | Wt 185.2 lb

## 2022-06-05 DIAGNOSIS — N390 Urinary tract infection, site not specified: Secondary | ICD-10-CM

## 2022-06-05 DIAGNOSIS — N3020 Other chronic cystitis without hematuria: Secondary | ICD-10-CM | POA: Diagnosis not present

## 2022-06-05 LAB — URINALYSIS, COMPLETE
Bilirubin, UA: NEGATIVE
Glucose, UA: NEGATIVE
Ketones, UA: NEGATIVE
Nitrite, UA: NEGATIVE
Protein,UA: NEGATIVE
RBC, UA: NEGATIVE
Specific Gravity, UA: 1.015 (ref 1.005–1.030)
Urobilinogen, Ur: 0.2 mg/dL (ref 0.2–1.0)
pH, UA: 5.5 (ref 5.0–7.5)

## 2022-06-05 LAB — MICROSCOPIC EXAMINATION: WBC, UA: >30 /hpf — AB (ref 0–5)

## 2022-06-05 MED ORDER — CEPHALEXIN 250 MG PO CAPS: 250.0000 mg | ORAL_CAPSULE | Freq: Every day | ORAL | 3 refills | Status: DC

## 2022-06-05 NOTE — Patient Instructions (Signed)
Will call with results

## 2022-06-05 NOTE — Progress Notes (Signed)
06/05/2022 1:05 PM   Jean Gonzales 12-May-1955 [id number]  Referring provider: [name], [address] [place],  [place] [address]  No chief complaint on file.   HPI: [place]: Recurrent urinary tract infections with multiple positive cultures.  Vaginal atrophy with history of endometrial carcinoma.  She may have urethral stenosis from pelvic surgery.  Had normal cystoscopy and was on low-dose Macrodantin.  She catheterizes once a day and gets residuals of 500 to 700 mL.  Has been given Flomax in the past.  Normal renal x-ray December 2021   The patient had a hysterectomy followed by chemotherapy and radiation for endometrial carcinoma about 6 years ago.  It appears last 2 years she was being dilated at a rate such that she started to catheterize even daily.  Now she catheterizes once a day.  She could not get 500 cc to a liter.  She generally voids 3-4 times a day only a few cc.  She gets up once a night.  When she voids her flow was weak.  She hesitates.  She does not strain.  She does not feel empty  She gets recurrent bladder infections with dark cloudy foul-smelling urine that respond favorably antibiotics.  She is in serious infection at least monthly or more often.  Few days later symptoms returned.  No pain or fever   Her urethral stenosis has dated back before 2019 when I reviewed the consult note.  She has rare incontinence and wears a pad for confidence.   She is convinced that antibiotics are not working.  It appears she had been on Macrodantin for 3 months but others have not been tried     We talked about colonization and over treating mild symptoms.  Educated patient about pathophysiology of recurrent urinary tract infections superimposed on high residuals and catheterizing.  Reassess in 6 weeks on daily Keflex 250 mg.  She gets a rash from sulfa.  Trimethoprim potentially an option.  Reeducation regarding history of discussed.  Probiotics discussed.   [place]berry discussed.  Reassess 6-week   I do not think there is any role for urodynamics or sacral nerve stimulation at this stage   Patient was on the daily Keflex and still had foul-smelling cloudy urine.  She is hoping to get an antibiotic for 7 to 14 days to cleared up and she is trying an over-the-counter preparation that coats the lining of the bladder.  She still catheterizes once a day.  I called in ciprofloxacin 250 mg twice a day for 10 days.  She understands that we may not be able to prevent her urine from being foul-smelling and cloudy but we have limited tools including daily Keflex.  She has been on Macrodantin daily in the past.     call if urine culture differs.  Called in ciprofloxacin for 10 days.  Utilize over-the-counter preparation.  Continue to catheterize once a day.  We both agreed to stay on the daily Keflex.  She will increase catheterization to once in the morning and once at night hoping this helps some.  In the past she is actually done 3 times a day which is probably not necessary.  She understands prophylaxis options are limited.   Today Incomplete bladder emptying stable.  Last culture positive.  Patient has done quite well on catheterizing twice a day, once a day Keflex and taking over-the-counter UQORA twice a day.  She was doing really well until she got COVID on Christmas Day.  She  is back on track and clinically not infected    Today Patient incomplete emptying stable.  Last culture was positive but she is colonized.  She has little bit of sediment in her urine that she is well aware of.  Clinically she doing great on daily Keflex and the UQORA which helps keeps the urine more clear.  She does not know why but her primary send her to nephrology.  I do not see anything obvious in the urinalysis.  She had a recent positive culture but again she is colonized.  PMH: Past Medical History:  Diagnosis Date   Ankylosing spondylitis ([place])    Arthritis     osteoarthritis right knee   Diet-controlled type 2 diabetes mellitus ([place])    Endometrial cancer ([place]) 2014   GERD (gastroesophageal reflux disease)    History of kidney stones    History of recurrent UTIs    a.) r/t self catheterization techniques and frequency; b.) followed by urology and is on daily suppressive antimicrobial therapy   Hx of migraine headaches    Hyperlipidemia    Hypertension    improved after ca tx   Intermittent self-catheterization of bladder    a.) required following XRT causing stenosis of urinary meatus; b.) self caths TWICE daily   Psoriatic arthritis ([place])    Urethral meatal stenosis    a.) following XRT for endometrial cancer    Surgical History: Past Surgical History:  Procedure Laterality Date   ABDOMINAL HYSTERECTOMY  2015   cancer   BREAST BIOPSY Right 2008ish   core   CATARACT EXTRACTION W/PHACO Right 02/05/2018   Procedure: CATARACT EXTRACTION PHACO AND INTRAOCULAR LENS PLACEMENT ([place]) RIGHT DIABETES IVA TOPICAL;  Surgeon: Eulogio Bear, MD;  Location: [facility];  Service: Ophthalmology;  Laterality: Right;   CATARACT EXTRACTION W/PHACO Left 02/25/2018   Procedure: CATARACT EXTRACTION PHACO AND INTRAOCULAR LENS PLACEMENT ([place]) LEFT DIABETIC;  Surgeon: Eulogio Bear, MD;  Location: [facility];  Service: Ophthalmology;  Laterality: Left;   CHOLECYSTECTOMY  1992   COLONOSCOPY WITH PROPOFOL N/A 08/08/2019   Procedure: COLONOSCOPY WITH PROPOFOL;  Surgeon: Jonathon Bellows, MD;  Location: [facility]COPY;  Service: Gastroenterology;  Laterality: N/A;   EYE SURGERY     KNEE ARTHROSCOPY WITH MEDIAL MENISECTOMY Left 12/06/2021   Procedure: KNEE ARTHROSCOPY WITH PARTIAL MEDIAL LATERAL MENISECTOMY, PARTIAL SYNOVECTOMY, LOOSE BODY REMOVAL;  Surgeon: Lovell Sheehan, MD;  Location: [facility];  Service: Orthopedics;  Laterality: Left;   TONSILLECTOMY  1973   TONSILLECTOMY     TOTAL KNEE ARTHROPLASTY Right 07/09/2017   Procedure: TOTAL KNEE  ARTHROPLASTY;  Surgeon: Lovell Sheehan, MD;  Location: [facility];  Service: Orthopedics;  Laterality: Right;    Home Medications:  Allergies as of 06/05/2022       Reactions   Iodinated Contrast Media Anaphylaxis   Bactrim [sulfamethoxazole-trimethoprim] Itching, Rash        Medication List        Accurate as of June 05, 2022  1:05 PM. If you have any questions, ask your nurse or doctor.          acetaminophen 325 MG tablet Commonly known as: TYLENOL Take 1-2 tablets (325-650 mg total) by mouth every 6 (six) hours as needed for mild pain (pain score 1-3 or temp > 100.5).   celecoxib 100 MG capsule Commonly known as: CELEBREX Take 1 capsule (100 mg total) by mouth 2 (two) times daily.   diclofenac sodium 1 % Gel Commonly known as:  VOLTAREN Apply 2 g topically 4 (four) times daily. Apply to knee   diclofenac Sodium 1 % Gel Commonly known as: Voltaren Apply 4 g topically 4 (four) times daily.   docusate sodium 100 MG capsule Commonly known as: Colace Take 1 capsule (100 mg total) by mouth daily as needed.   esomeprazole 40 MG capsule Commonly known as: NEXIUM TAKE 1 CAPSULE BY MOUTH DAILY   fenofibrate 145 MG tablet Commonly known as: Tricor Take 1 tablet (145 mg total) by mouth daily. What changed: when to take this   HYDROcodone-acetaminophen 5-325 MG tablet Commonly known as: NORCO/VICODIN Take 1 tablet by mouth every 4 (four) hours as needed for moderate pain.   nystatin powder Commonly known as: MYCOSTATIN/NYSTOP Apply topically 3 (three) times daily. What changed:  how much to take when to take this reasons to take this   [place] 2 w/Device Kit Use as directed   OneTouch Delica Lancets [id number] Misc Use as directed   OneTouch Verio test strip Generic drug: glucose blood Use as instructed   OVER THE COUNTER MEDICATION Take 1 tablet by mouth 2 (two) times daily. UQORA DEFEND   OVER THE COUNTER MEDICATION Take 1 tablet by mouth 2 (two)  times daily. VIVISCAL   rosuvastatin 20 MG tablet Commonly known as: Crestor Take 1 tablet (20 mg total) by mouth daily. What changed: when to take this   Semaglutide (1 MG/DOSE) 4 MG/3ML Sopn Inject 2 mg into the skin once a week.   tamsulosin 0.4 MG Caps capsule Commonly known as: FLOMAX TAKE ONE CAPSULE BY MOUTH DAILY   triamcinolone ointment 0.1 % Commonly known as: KENALOG APPLY TO AFFECTED AREA TWICE A DAY What changed: See the new instructions.        Allergies:  Allergies  Allergen Reactions   Iodinated Contrast Media Anaphylaxis   Bactrim [Sulfamethoxazole-Trimethoprim] Itching and Rash    Family History: Family History  Problem Relation Age of Onset   Diabetes Mother    Cancer Mother    Heart disease Father    Breast cancer Neg Hx    Kidney cancer Neg Hx    Bladder Cancer Neg Hx     Social History:  reports that she has never smoked. She has never used smokeless tobacco. She reports that she does not drink alcohol and does not use drugs.  ROS:                                        Physical Exam: There were no vitals taken for this visit.  Constitutional:  Alert and oriented, No acute distress. Laboratory Data: Lab Results  Component Value Date   WBC 10.0 05/23/2022   HGB 13.6 05/23/2022   HCT 41.4 05/23/2022   MCV 91 05/23/2022   PLT 254 05/23/2022    Lab Results  Component Value Date   CREATININE 0.84 05/23/2022    No results found for: "PSA"  No results found for: "TESTOSTERONE"  Lab Results  Component Value Date   HGBA1C 5.6 05/23/2022    Urinalysis    Component Value Date/Time   COLORURINE STRAW (A) 01/01/2022 1242   APPEARANCEUR Cloudy (A) 05/23/2022 1420   LABSPEC 1.003 (L) 01/01/2022 1242   PHURINE 7.0 01/01/2022 1242   GLUCOSEU Negative 05/23/2022 1420   HGBUR SMALL (A) 01/01/2022 1242   BILIRUBINUR Negative 05/23/2022 1420   KETONESUR NEGATIVE 01/01/2022 1242  PROTEINUR Negative 05/23/2022  1420   PROTEINUR NEGATIVE 01/01/2022 1242   UROBILINOGEN 0.2 09/19/2021 1340   UROBILINOGEN 0.2 01/25/2007 1533   NITRITE Positive (A) 05/23/2022 1420   NITRITE NEGATIVE 01/01/2022 1242   LEUKOCYTESUR 2+ (A) 05/23/2022 1420   LEUKOCYTESUR LARGE (A) 01/01/2022 1242    Pertinent Imaging:   Assessment & Plan: I did not send the urine for culture.  I renewed the Keflex 90 x 3.  I will get a baseline renal ultrasound and call if abnormal.  This will be done approximately every 2 years  1. Chronic cystitis    No follow-ups on file.  Reece Packer, MD  [facility] [address], [place], [place] [address] [phone]

## 2022-06-05 NOTE — Progress Notes (Signed)
Hi Dail  Normal mammogram; repeat in 1 year.  Please let us know if you have any questions.  Thank you,  Tally Joe, FNP

## 2022-06-13 DIAGNOSIS — R32 Unspecified urinary incontinence: Secondary | ICD-10-CM | POA: Diagnosis not present

## 2022-06-13 DIAGNOSIS — C541 Malignant neoplasm of endometrium: Secondary | ICD-10-CM | POA: Diagnosis not present

## 2022-06-13 DIAGNOSIS — R339 Retention of urine, unspecified: Secondary | ICD-10-CM | POA: Diagnosis not present

## 2022-06-21 ENCOUNTER — Other Ambulatory Visit: Payer: Self-pay | Admitting: Family Medicine

## 2022-06-21 DIAGNOSIS — R609 Edema, unspecified: Secondary | ICD-10-CM | POA: Diagnosis not present

## 2022-06-21 DIAGNOSIS — R809 Proteinuria, unspecified: Secondary | ICD-10-CM | POA: Diagnosis not present

## 2022-06-21 DIAGNOSIS — I10 Essential (primary) hypertension: Secondary | ICD-10-CM | POA: Diagnosis not present

## 2022-06-21 DIAGNOSIS — K219 Gastro-esophageal reflux disease without esophagitis: Secondary | ICD-10-CM | POA: Diagnosis not present

## 2022-06-21 DIAGNOSIS — E1122 Type 2 diabetes mellitus with diabetic chronic kidney disease: Secondary | ICD-10-CM | POA: Diagnosis not present

## 2022-06-21 DIAGNOSIS — E668 Other obesity: Secondary | ICD-10-CM | POA: Diagnosis not present

## 2022-06-29 ENCOUNTER — Ambulatory Visit: Admission: RE | Admit: 2022-06-29 | Payer: Medicare HMO | Source: Ambulatory Visit

## 2022-07-09 ENCOUNTER — Other Ambulatory Visit: Payer: Self-pay | Admitting: Family Medicine

## 2022-07-17 DIAGNOSIS — R32 Unspecified urinary incontinence: Secondary | ICD-10-CM | POA: Diagnosis not present

## 2022-07-17 DIAGNOSIS — R339 Retention of urine, unspecified: Secondary | ICD-10-CM | POA: Diagnosis not present

## 2022-07-17 DIAGNOSIS — C541 Malignant neoplasm of endometrium: Secondary | ICD-10-CM | POA: Diagnosis not present

## 2022-07-22 ENCOUNTER — Other Ambulatory Visit: Payer: Self-pay | Admitting: Family Medicine

## 2022-08-25 ENCOUNTER — Encounter: Payer: Self-pay | Admitting: Urology

## 2022-08-29 ENCOUNTER — Encounter: Payer: Self-pay | Admitting: Family Medicine

## 2022-08-31 ENCOUNTER — Other Ambulatory Visit: Payer: Self-pay

## 2022-08-31 MED ORDER — FENOFIBRATE 145 MG PO TABS: 145.0000 mg | ORAL_TABLET | Freq: Every day | ORAL | 5 refills | Status: DC

## 2022-09-19 ENCOUNTER — Telehealth: Payer: Self-pay | Admitting: *Deleted

## 2022-09-19 NOTE — Progress Notes (Signed)
  Care Coordination  Outreach Note  09/19/2022 Name: Jean Gonzales MRN: [id number] DOB: [birth year]   Care Coordination Outreach Attempts: An unsuccessful telephone outreach was attempted today to offer the patient information about available care coordination services.  Follow Up Plan:  Additional outreach attempts will be made to offer the patient care coordination information and services.   Encounter Outcome:  No Answer  Burman Nieves, CCMA Care Coordination Care Guide Direct Dial: [phone]

## 2022-09-25 DIAGNOSIS — Z961 Presence of intraocular lens: Secondary | ICD-10-CM | POA: Diagnosis not present

## 2022-09-25 DIAGNOSIS — H43812 Vitreous degeneration, left eye: Secondary | ICD-10-CM | POA: Diagnosis not present

## 2022-09-25 DIAGNOSIS — H35372 Puckering of macula, left eye: Secondary | ICD-10-CM | POA: Diagnosis not present

## 2022-09-25 LAB — HM DIABETES EYE EXAM

## 2022-10-09 NOTE — Progress Notes (Signed)
  Care Coordination  Outreach Note  10/09/2022 Name: Jonise Weightman MRN: [id number] DOB: January 15, 1956   Care Coordination Outreach Attempts: A second unsuccessful outreach was attempted today to offer the patient with information about available care coordination services.  Follow Up Plan:  Additional outreach attempts will be made to offer the patient care coordination information and services.   Encounter Outcome:  No Answer  Burman Nieves, CCMA Care Coordination Care Guide Direct Dial: [phone]

## 2022-10-10 NOTE — Progress Notes (Signed)
  Care Coordination  Outreach Note  10/10/2022 Name: Jean Gonzales MRN: [id number] DOB: 1956-03-06   Care Coordination Outreach Attempts: A third unsuccessful outreach was attempted today to offer the patient with information about available care coordination services.  Follow Up Plan:  No further outreach attempts will be made at this time. We have been unable to contact the patient to offer or enroll patient in care coordination services  Encounter Outcome:  No Answer  Burman Nieves, [facility] Care Coordination Care Guide Direct Dial: [phone]

## 2022-10-12 DIAGNOSIS — C541 Malignant neoplasm of endometrium: Secondary | ICD-10-CM | POA: Diagnosis not present

## 2022-10-12 DIAGNOSIS — R32 Unspecified urinary incontinence: Secondary | ICD-10-CM | POA: Diagnosis not present

## 2022-10-12 DIAGNOSIS — R339 Retention of urine, unspecified: Secondary | ICD-10-CM | POA: Diagnosis not present

## 2022-10-17 ENCOUNTER — Ambulatory Visit
Admission: RE | Admit: 2022-10-17 | Discharge: 2022-10-17 | Disposition: A | Discharge status: Home / Self Care | Payer: Medicare HMO | Source: Ambulatory Visit | Attending: Urology | Admitting: Urology

## 2022-10-17 DIAGNOSIS — N3020 Other chronic cystitis without hematuria: Secondary | ICD-10-CM | POA: Insufficient documentation

## 2022-10-17 DIAGNOSIS — N390 Urinary tract infection, site not specified: Secondary | ICD-10-CM | POA: Insufficient documentation

## 2022-10-31 DIAGNOSIS — M1712 Unilateral primary osteoarthritis, left knee: Secondary | ICD-10-CM | POA: Diagnosis not present

## 2022-10-31 DIAGNOSIS — M25561 Pain in right knee: Secondary | ICD-10-CM | POA: Diagnosis not present

## 2022-10-31 DIAGNOSIS — Z96652 Presence of left artificial knee joint: Secondary | ICD-10-CM | POA: Diagnosis not present

## 2022-11-13 DIAGNOSIS — M1711 Unilateral primary osteoarthritis, right knee: Secondary | ICD-10-CM | POA: Diagnosis not present

## 2022-11-13 DIAGNOSIS — M1712 Unilateral primary osteoarthritis, left knee: Secondary | ICD-10-CM | POA: Diagnosis not present

## 2022-11-13 DIAGNOSIS — Z96652 Presence of left artificial knee joint: Secondary | ICD-10-CM | POA: Diagnosis not present

## 2022-11-22 ENCOUNTER — Ambulatory Visit (INDEPENDENT_AMBULATORY_CARE_PROVIDER_SITE_OTHER): Payer: Medicare HMO | Admitting: Family Medicine

## 2022-11-22 ENCOUNTER — Encounter: Payer: Self-pay | Admitting: Family Medicine

## 2022-11-22 VITALS — BP 101/74 | HR 89 | Temp 98.1°F | Resp 12 | Ht 67.5 in | Wt 182.2 lb

## 2022-11-22 DIAGNOSIS — I152 Hypertension secondary to endocrine disorders: Secondary | ICD-10-CM | POA: Diagnosis not present

## 2022-11-22 DIAGNOSIS — E1142 Type 2 diabetes mellitus with diabetic polyneuropathy: Secondary | ICD-10-CM | POA: Diagnosis not present

## 2022-11-22 DIAGNOSIS — R5383 Other fatigue: Secondary | ICD-10-CM

## 2022-11-22 DIAGNOSIS — E1159 Type 2 diabetes mellitus with other circulatory complications: Secondary | ICD-10-CM | POA: Diagnosis not present

## 2022-11-22 DIAGNOSIS — Z9181 History of falling: Secondary | ICD-10-CM | POA: Diagnosis not present

## 2022-11-22 DIAGNOSIS — E1169 Type 2 diabetes mellitus with other specified complication: Secondary | ICD-10-CM

## 2022-11-22 DIAGNOSIS — E785 Hyperlipidemia, unspecified: Secondary | ICD-10-CM | POA: Diagnosis not present

## 2022-11-22 DIAGNOSIS — Z7985 Long-term (current) use of injectable non-insulin antidiabetic drugs: Secondary | ICD-10-CM | POA: Diagnosis not present

## 2022-11-22 LAB — POCT GLYCOSYLATED HEMOGLOBIN (HGB A1C): Hemoglobin A1C: 6 % — AB (ref 4.0–5.6)

## 2022-11-22 NOTE — Progress Notes (Signed)
Established patient visit   Patient: Jean Gonzales   DOB: [birth year]   67 y.o. Female  MRN: [id number] Visit Date: 11/22/2022  Today's healthcare provider: Jacky Kindle, FNP  Introduced to nurse practitioner role and practice setting.  All questions answered.  Discussed provider/patient relationship and expectations.  Subjective    HPI HPI     Medical Management of Chronic Issues    Additional comments: Patient reports good compliance and tolerance to medications. She is not checking blood sugar daily or BP.      Last edited by Myles Lipps, CMA on 11/22/2022  1:39 PM.      Medications: Outpatient Medications Prior to Visit  Medication Sig   cephALEXin (KEFLEX) 250 MG capsule Take 1 capsule (250 mg total) by mouth daily.   fenofibrate (TRICOR) 145 MG tablet Take 1 tablet (145 mg total) by mouth daily.   rosuvastatin (CRESTOR) 20 MG tablet TAKE ONE TABLET BY MOUTH DAILY   Semaglutide, 2 MG/DOSE, (OZEMPIC, 2 MG/DOSE,) 8 MG/3ML SOPN Inject 2 mg into the skin once a week.   tamsulosin (FLOMAX) 0.4 MG CAPS capsule TAKE ONE CAPSULE BY MOUTH DAILY   [DISCONTINUED] acetaminophen (TYLENOL) 325 MG tablet Take 1-2 tablets (325-650 mg total) by mouth every 6 (six) hours as needed for mild pain (pain score 1-3 or temp > 100.5).   [DISCONTINUED] Blood Glucose Monitoring Suppl (ONE TOUCH ULTRA 2) w/Device KIT Use as directed   [DISCONTINUED] celecoxib (CELEBREX) 100 MG capsule Take 1 capsule (100 mg total) by mouth 2 (two) times daily.   [DISCONTINUED] docusate sodium (COLACE) 100 MG capsule Take 1 capsule (100 mg total) by mouth daily as needed.   [DISCONTINUED] esomeprazole (NEXIUM) 40 MG capsule TAKE 1 CAPSULE BY MOUTH DAILY   [DISCONTINUED] glucose blood (ONETOUCH VERIO) test strip Use as instructed   [DISCONTINUED] nystatin (MYCOSTATIN/NYSTOP) powder Apply topically 3 (three) times daily. (Patient taking differently: Apply 1 Application topically daily as needed.)    [DISCONTINUED] OneTouch Delica Lancets 33G MISC Use as directed   [DISCONTINUED] OVER THE COUNTER MEDICATION Take 1 tablet by mouth 2 (two) times daily. Darrold Junker DEFEND   [DISCONTINUED] triamcinolone ointment (KENALOG) 0.1 % APPLY TO AFFECTED AREA TWICE A DAY (Patient taking differently: 1 Application as needed.)   [DISCONTINUED] HYDROcodone-acetaminophen (NORCO/VICODIN) 5-325 MG tablet Take 1 tablet by mouth every 4 (four) hours as needed for moderate pain. (Patient not taking: Reported on 11/22/2022)   [DISCONTINUED] OVER THE COUNTER MEDICATION Take 1 tablet by mouth 2 (two) times daily. VIVISCAL (Patient not taking: Reported on 11/22/2022)   No facility-administered medications prior to visit.     Objective    BP 101/74 (BP Location: Right Arm, Patient Position: Sitting, Cuff Size: Large)   Pulse 89   Temp 98.1 F (36.7 C) (Temporal)   Resp 12   Ht 5' 7.5" (1.715 m)   Wt 182 lb 3.2 oz (82.6 kg)   SpO2 99%   BMI 28.12 kg/m   Physical Exam Vitals and nursing note reviewed.  Constitutional:      General: She is not in acute distress.    Appearance: Normal appearance. She is overweight. She is not ill-appearing, toxic-appearing or diaphoretic.  HENT:     Head: Normocephalic and atraumatic.  Cardiovascular:     Rate and Rhythm: Normal rate and regular rhythm.     Pulses: Normal pulses.     Heart sounds: Normal heart sounds. No murmur heard.    No friction rub. No gallop.  Pulmonary:     Effort: Pulmonary effort is normal. No respiratory distress.     Breath sounds: Normal breath sounds. No stridor. No wheezing, rhonchi or rales.  Chest:     Chest wall: No tenderness.  Musculoskeletal:        General: No swelling, tenderness, deformity or signs of injury. Normal range of motion.     Right lower leg: No edema.     Left lower leg: No edema.  Skin:    General: Skin is warm and dry.     Capillary Refill: Capillary refill takes less than 2 seconds.     Coloration: Skin is not  jaundiced or pale.     Findings: No bruising, erythema, lesion or rash.  Neurological:     General: No focal deficit present.     Mental Status: She is alert and oriented to person, place, and time. Mental status is at baseline.     Cranial Nerves: No cranial nerve deficit.     Sensory: No sensory deficit.     Motor: No weakness.     Coordination: Coordination normal.  Psychiatric:        Mood and Affect: Mood normal.        Behavior: Behavior normal.        Thought Content: Thought content normal.        Judgment: Judgment normal.      Results for orders placed or performed in visit on 11/22/22  POCT glycosylated hemoglobin (Hb A1C)  Result Value Ref Range   Hemoglobin A1C 6.0 (A) 4.0 - 5.6 %    Assessment & Plan     Problem List Items Addressed This Visit       Cardiovascular and Mediastinum   Hypertension associated with diabetes (HCC)    Chronic, at goal Continue to recommend home checks to assist Goal of 129/79 or less Continues without meds at this time following weight loss; may benefit from 2.5 mg lisinopril to assist kidney function        Endocrine   Hyperlipidemia associated with type 2 diabetes mellitus (HCC)    Chronic, previously elevated fats Non adherence to tricor 145 mg Continues on crestor 20 LDL goal <70 recommend diet low in saturated fat and regular exercise - 30 min at least 5 times per week Pt reclines LP at this time       Type 2 diabetes mellitus with diabetic polyneuropathy, without long-term current use of insulin (HCC) - Primary    Chronic, A1c increase; however, stable Reports BG at home fasting around 70-80s Continue on ozempic 2 mg to further assist diet/exercise Continue to recommend balanced, lower carb meals. Smaller meal size, adding snacks. Choosing water as drink of choice and increasing purposeful exercise.       Relevant Orders   POCT glycosylated hemoglobin (Hb A1C) (Completed)     Other   History of fall    Reports  tripped by her dog and landed in flower bed; denies injury Continues to have chronic joint pain f/b ortho to further assist  No recurrent ailments at this time       Other fatigue    Acute, unknown cause; however, pt reports she has been likely eating more sugar as her A1c is elevated and she also reports stress at home with her SO and his health challenges in addition to ongoing joint concerns  Continue to monitor       Return if symptoms worsen or fail to improve, for annual  examination.      Leilani Merl, FNP, have reviewed all documentation for this visit. The documentation on 11/22/22 for the exam, diagnosis, procedures, and orders are all accurate and complete.  Jacky Kindle, FNP  [facility] [phone] (phone) [phone] (fax)  [facility]

## 2022-11-22 NOTE — Assessment & Plan Note (Signed)
Chronic, A1c increase; however, stable Reports BG at home fasting around 70-80s Continue on ozempic 2 mg to further assist diet/exercise Continue to recommend balanced, lower carb meals. Smaller meal size, adding snacks. Choosing water as drink of choice and increasing purposeful exercise.

## 2022-11-22 NOTE — Assessment & Plan Note (Signed)
Chronic, previously elevated fats Non adherence to tricor 145 mg Continues on crestor 20 LDL goal <70 recommend diet low in saturated fat and regular exercise - 30 min at least 5 times per week Pt reclines LP at this time

## 2022-11-22 NOTE — Assessment & Plan Note (Signed)
Reports tripped by her dog and landed in flower bed; denies injury Continues to have chronic joint pain f/b ortho to further assist  No recurrent ailments at this time

## 2022-11-22 NOTE — Assessment & Plan Note (Signed)
Chronic, at goal Continue to recommend home checks to assist Goal of 129/79 or less Continues without meds at this time following weight loss; may benefit from 2.5 mg lisinopril to assist kidney function

## 2022-11-22 NOTE — Assessment & Plan Note (Signed)
Acute, unknown cause; however, pt reports she has been likely eating more sugar as her A1c is elevated and she also reports stress at home with her SO and his health challenges in addition to ongoing joint concerns  Continue to monitor

## 2022-12-02 ENCOUNTER — Other Ambulatory Visit: Payer: Self-pay | Admitting: Family Medicine

## 2022-12-02 DIAGNOSIS — K2100 Gastro-esophageal reflux disease with esophagitis, without bleeding: Secondary | ICD-10-CM

## 2022-12-04 DIAGNOSIS — H4312 Vitreous hemorrhage, left eye: Secondary | ICD-10-CM | POA: Diagnosis not present

## 2022-12-04 DIAGNOSIS — H43812 Vitreous degeneration, left eye: Secondary | ICD-10-CM | POA: Diagnosis not present

## 2023-01-03 DIAGNOSIS — K219 Gastro-esophageal reflux disease without esophagitis: Secondary | ICD-10-CM | POA: Diagnosis not present

## 2023-01-03 DIAGNOSIS — Z9049 Acquired absence of other specified parts of digestive tract: Secondary | ICD-10-CM | POA: Diagnosis not present

## 2023-01-03 DIAGNOSIS — Z91048 Other nonmedicinal substance allergy status: Secondary | ICD-10-CM | POA: Diagnosis not present

## 2023-01-03 DIAGNOSIS — Z8261 Family history of arthritis: Secondary | ICD-10-CM | POA: Diagnosis not present

## 2023-01-03 DIAGNOSIS — Z7982 Long term (current) use of aspirin: Secondary | ICD-10-CM | POA: Diagnosis not present

## 2023-01-03 DIAGNOSIS — M459 Ankylosing spondylitis of unspecified sites in spine: Secondary | ICD-10-CM | POA: Diagnosis not present

## 2023-01-03 DIAGNOSIS — Z91041 Radiographic dye allergy status: Secondary | ICD-10-CM | POA: Diagnosis not present

## 2023-01-03 DIAGNOSIS — E1140 Type 2 diabetes mellitus with diabetic neuropathy, unspecified: Secondary | ICD-10-CM | POA: Diagnosis not present

## 2023-01-03 DIAGNOSIS — G620 Drug-induced polyneuropathy: Secondary | ICD-10-CM | POA: Diagnosis not present

## 2023-01-03 DIAGNOSIS — M069 Rheumatoid arthritis, unspecified: Secondary | ICD-10-CM | POA: Diagnosis not present

## 2023-01-03 DIAGNOSIS — Z809 Family history of malignant neoplasm, unspecified: Secondary | ICD-10-CM | POA: Diagnosis not present

## 2023-01-03 DIAGNOSIS — T451X5S Adverse effect of antineoplastic and immunosuppressive drugs, sequela: Secondary | ICD-10-CM | POA: Diagnosis not present

## 2023-01-03 DIAGNOSIS — Z791 Long term (current) use of non-steroidal anti-inflammatories (NSAID): Secondary | ICD-10-CM | POA: Diagnosis not present

## 2023-01-03 DIAGNOSIS — H43812 Vitreous degeneration, left eye: Secondary | ICD-10-CM | POA: Diagnosis not present

## 2023-01-03 DIAGNOSIS — I10 Essential (primary) hypertension: Secondary | ICD-10-CM | POA: Diagnosis not present

## 2023-01-03 DIAGNOSIS — H43392 Other vitreous opacities, left eye: Secondary | ICD-10-CM | POA: Diagnosis not present

## 2023-01-03 DIAGNOSIS — Z8542 Personal history of malignant neoplasm of other parts of uterus: Secondary | ICD-10-CM | POA: Diagnosis not present

## 2023-01-03 DIAGNOSIS — Z79899 Other long term (current) drug therapy: Secondary | ICD-10-CM | POA: Diagnosis not present

## 2023-01-09 ENCOUNTER — Telehealth: Payer: Self-pay | Admitting: Family Medicine

## 2023-01-09 DIAGNOSIS — Z0279 Encounter for issue of other medical certificate: Secondary | ICD-10-CM

## 2023-01-09 NOTE — Telephone Encounter (Signed)
Disability placard form being sent back for completion. Please contact patient when signed.

## 2023-01-10 ENCOUNTER — Telehealth: Payer: Self-pay

## 2023-01-10 NOTE — Telephone Encounter (Signed)
LM letting patient know parking placard placed up front ready for pick up and also send a mychart message.

## 2023-01-10 NOTE — Telephone Encounter (Signed)
Please see other encounter.

## 2023-01-22 ENCOUNTER — Telehealth: Payer: Self-pay | Admitting: Family Medicine

## 2023-01-22 NOTE — Telephone Encounter (Signed)
Patient declined Annual Wellness Visit when [facility] called to do AWV on 01/08/2023.  Patient said she doesn't want to do AWVs anymore.

## 2023-01-25 DIAGNOSIS — Z008 Encounter for other general examination: Secondary | ICD-10-CM | POA: Diagnosis not present

## 2023-02-12 ENCOUNTER — Encounter: Payer: Self-pay | Admitting: Family Medicine

## 2023-02-13 ENCOUNTER — Other Ambulatory Visit: Payer: Self-pay | Admitting: Family Medicine

## 2023-02-19 DIAGNOSIS — H43812 Vitreous degeneration, left eye: Secondary | ICD-10-CM | POA: Diagnosis not present

## 2023-03-19 ENCOUNTER — Telehealth: Payer: Self-pay | Admitting: Family Medicine

## 2023-03-19 NOTE — Telephone Encounter (Signed)
Spoke to pt--stated will put the paperwork on hold until talk to her insurance. Tried to make an appt, but pt declined.

## 2023-03-19 NOTE — Telephone Encounter (Signed)
Jean Gonzales with Jean Gonzales is calling in to check on the status of paperwork he faxed over. Jean Gonzales says the paperwork needs to be reviewed and a decision needs to be made. Jean Gonzales said he left a message on Friday 03/16/23 and hasn't heard back or received anything. Please follow up with Jean Gonzales.

## 2023-03-26 NOTE — Telephone Encounter (Signed)
Received a fax from [employer].  Placed in Jean Gonzales's box however it looks like from previous message that patient wanted to put on hold until she talks to her insurance.

## 2023-03-26 NOTE — Telephone Encounter (Signed)
Derek from [employer] called to f/u on the pab req that was sent on 12/19. Advised did not see the form but request that he refax.

## 2023-05-17 ENCOUNTER — Telehealth: Payer: Self-pay | Admitting: Family Medicine

## 2023-05-17 ENCOUNTER — Other Ambulatory Visit: Payer: Self-pay

## 2023-05-17 DIAGNOSIS — Z79899 Other long term (current) drug therapy: Secondary | ICD-10-CM

## 2023-05-17 DIAGNOSIS — K2100 Gastro-esophageal reflux disease with esophagitis, without bleeding: Secondary | ICD-10-CM

## 2023-05-17 DIAGNOSIS — N182 Chronic kidney disease, stage 2 (mild): Secondary | ICD-10-CM

## 2023-05-17 DIAGNOSIS — E1142 Type 2 diabetes mellitus with diabetic polyneuropathy: Secondary | ICD-10-CM

## 2023-05-17 NOTE — Telephone Encounter (Signed)
Jean Gonzales pharmacy is requesting refill Semaglutide, 2 MG/DOSE, (OZEMPIC, 2 MG/DOSE,) 8 MG/3ML SOPN   Please advise

## 2023-05-18 ENCOUNTER — Other Ambulatory Visit: Payer: Self-pay | Admitting: Family Medicine

## 2023-05-18 DIAGNOSIS — N182 Chronic kidney disease, stage 2 (mild): Secondary | ICD-10-CM | POA: Insufficient documentation

## 2023-05-18 DIAGNOSIS — K2100 Gastro-esophageal reflux disease with esophagitis, without bleeding: Secondary | ICD-10-CM | POA: Insufficient documentation

## 2023-05-18 MED ORDER — OZEMPIC (2 MG/DOSE) 8 MG/3ML ~~LOC~~ SOPN: 2.0000 mg | PEN_INJECTOR | SUBCUTANEOUS | 3 refills | Status: DC

## 2023-05-18 MED ORDER — ESOMEPRAZOLE MAGNESIUM 40 MG PO CPDR: 40.0000 mg | DELAYED_RELEASE_CAPSULE | Freq: Every day | ORAL | 3 refills | Status: AC

## 2023-05-18 NOTE — Telephone Encounter (Signed)
Patient scheduled with Dr. Payton Mccallum 06/18/2023 At 2:20 pm.  Patient is requesting refill for Ozempic and Omeprazole.  Patient report that previous provider send in the wrong instruction/prescription to her pharmacy.

## 2023-05-18 NOTE — Telephone Encounter (Signed)
2nd request for refill on Ozempic from [employer].

## 2023-05-23 ENCOUNTER — Encounter: Payer: Self-pay | Admitting: Family Medicine

## 2023-06-13 ENCOUNTER — Ambulatory Visit: Payer: Medicare HMO | Admitting: Family Medicine

## 2023-06-15 ENCOUNTER — Telehealth: Payer: Self-pay | Admitting: Urology

## 2023-06-15 NOTE — Telephone Encounter (Signed)
 Patient called to schedule one year follow up with Dr. Sherron Monday. The next available appointment is 07/02/23, so she is scheduled for this date. She will run out of Keflex 250 mg prescription before then, and is requesting refill be sent in. [facility] [address] in [place].

## 2023-06-18 ENCOUNTER — Ambulatory Visit: Payer: Medicare HMO | Admitting: Family Medicine

## 2023-06-18 ENCOUNTER — Other Ambulatory Visit: Payer: Self-pay

## 2023-06-18 ENCOUNTER — Encounter: Payer: Self-pay | Admitting: Family Medicine

## 2023-06-18 VITALS — BP 149/84 | HR 75 | Ht 67.0 in | Wt 187.0 lb

## 2023-06-18 DIAGNOSIS — I209 Angina pectoris, unspecified: Secondary | ICD-10-CM

## 2023-06-18 DIAGNOSIS — N182 Chronic kidney disease, stage 2 (mild): Secondary | ICD-10-CM

## 2023-06-18 DIAGNOSIS — M23209 Derangement of unspecified meniscus due to old tear or injury, unspecified knee: Secondary | ICD-10-CM | POA: Insufficient documentation

## 2023-06-18 DIAGNOSIS — N9989 Other postprocedural complications and disorders of genitourinary system: Secondary | ICD-10-CM

## 2023-06-18 DIAGNOSIS — L4050 Arthropathic psoriasis, unspecified: Secondary | ICD-10-CM

## 2023-06-18 DIAGNOSIS — E1142 Type 2 diabetes mellitus with diabetic polyneuropathy: Secondary | ICD-10-CM

## 2023-06-18 DIAGNOSIS — Z1231 Encounter for screening mammogram for malignant neoplasm of breast: Secondary | ICD-10-CM

## 2023-06-18 DIAGNOSIS — M069 Rheumatoid arthritis, unspecified: Secondary | ICD-10-CM

## 2023-06-18 DIAGNOSIS — E785 Hyperlipidemia, unspecified: Secondary | ICD-10-CM

## 2023-06-18 DIAGNOSIS — E1169 Type 2 diabetes mellitus with other specified complication: Secondary | ICD-10-CM | POA: Diagnosis not present

## 2023-06-18 DIAGNOSIS — M722 Plantar fascial fibromatosis: Secondary | ICD-10-CM | POA: Insufficient documentation

## 2023-06-18 DIAGNOSIS — N390 Urinary tract infection, site not specified: Secondary | ICD-10-CM | POA: Insufficient documentation

## 2023-06-18 DIAGNOSIS — M459 Ankylosing spondylitis of unspecified sites in spine: Secondary | ICD-10-CM | POA: Diagnosis not present

## 2023-06-18 DIAGNOSIS — M23204 Derangement of unspecified medial meniscus due to old tear or injury, left knee: Secondary | ICD-10-CM | POA: Diagnosis not present

## 2023-06-18 DIAGNOSIS — L304 Erythema intertrigo: Secondary | ICD-10-CM | POA: Insufficient documentation

## 2023-06-18 DIAGNOSIS — E1159 Type 2 diabetes mellitus with other circulatory complications: Secondary | ICD-10-CM

## 2023-06-18 DIAGNOSIS — K580 Irritable bowel syndrome with diarrhea: Secondary | ICD-10-CM | POA: Insufficient documentation

## 2023-06-18 DIAGNOSIS — I152 Hypertension secondary to endocrine disorders: Secondary | ICD-10-CM | POA: Diagnosis not present

## 2023-06-18 DIAGNOSIS — K2100 Gastro-esophageal reflux disease with esophagitis, without bleeding: Secondary | ICD-10-CM | POA: Diagnosis not present

## 2023-06-18 DIAGNOSIS — Z789 Other specified health status: Secondary | ICD-10-CM | POA: Insufficient documentation

## 2023-06-18 DIAGNOSIS — M1711 Unilateral primary osteoarthritis, right knee: Secondary | ICD-10-CM

## 2023-06-18 MED ORDER — LANCET DEVICE MISC
1.0000 | Freq: Every day | 0 refills | Status: AC
Start: 2023-06-18 — End: ?

## 2023-06-18 MED ORDER — LANCETS MISC. MISC: 1.0000 | Freq: Every day | 3 refills | Status: AC

## 2023-06-18 MED ORDER — TAMSULOSIN HCL 0.4 MG PO CAPS: 0.4000 mg | ORAL_CAPSULE | Freq: Every day | ORAL | 3 refills | Status: AC

## 2023-06-18 MED ORDER — BLOOD GLUCOSE MONITORING SUPPL DEVI: 1.0000 | Freq: Every day | 0 refills | Status: AC

## 2023-06-18 MED ORDER — NYSTATIN 100000 UNIT/GM EX POWD: 1.0000 | Freq: Three times a day (TID) | CUTANEOUS | 1 refills | Status: AC

## 2023-06-18 MED ORDER — FENOFIBRATE 145 MG PO TABS: 145.0000 mg | ORAL_TABLET | Freq: Every day | ORAL | 3 refills | Status: AC

## 2023-06-18 MED ORDER — DICLOFENAC SODIUM 1 % EX GEL: 2.0000 g | Freq: Four times a day (QID) | CUTANEOUS | 1 refills | Status: AC | PRN

## 2023-06-18 MED ORDER — CEPHALEXIN 250 MG PO CAPS: 250.0000 mg | ORAL_CAPSULE | Freq: Every day | ORAL | 0 refills | Status: DC

## 2023-06-18 MED ORDER — TRIAMCINOLONE ACETONIDE 0.1 % EX OINT: 1.0000 | TOPICAL_OINTMENT | Freq: Two times a day (BID) | CUTANEOUS | 1 refills | Status: AC

## 2023-06-18 MED ORDER — BLOOD GLUCOSE TEST VI STRP: 1.0000 | ORAL_STRIP | Freq: Every day | 3 refills | Status: AC

## 2023-06-18 MED ORDER — ROSUVASTATIN CALCIUM 20 MG PO TABS: 20.0000 mg | ORAL_TABLET | Freq: Every day | ORAL | 3 refills | Status: DC

## 2023-06-18 MED ORDER — LOPERAMIDE HCL 2 MG PO CAPS: 2.0000 mg | ORAL_CAPSULE | Freq: Three times a day (TID) | ORAL | 0 refills | Status: DC | PRN

## 2023-06-18 MED ORDER — ALCOHOL SWABS 70 % PADS
1.0000 | MEDICATED_PAD | Freq: Every day | 11 refills | Status: DC
Start: 2023-06-18 — End: 2023-09-19

## 2023-06-18 NOTE — Assessment & Plan Note (Signed)
 Chronic plantar fasciitis causing significant foot pain. - Provide exercises for plantar fasciitis. - Recommend freezing a water bottle for foot rolling.

## 2023-06-18 NOTE — Assessment & Plan Note (Signed)
 Noted.  No acute concerns. Follows with rheumatology; defer to specialist management.

## 2023-06-18 NOTE — Progress Notes (Signed)
 Established patient visit   Patient: Jean Gonzales   DOB: 11-29-1955   68 y.o. Female  MRN: [id number] Visit Date: 06/18/2023  Today's healthcare provider: Sherlyn Hay, DO   Chief Complaint  Patient presents with   Establish Care    Discuss energy, she is fine in the morning but about 10 mins later she feels dizzy. She use to be on a Bp medication years ago, discuss Neuropathy, IBS and Plantar fasciitis    Subjective    HPI Jean Lagunes "Annice Pih" is a 68 year old female who presents for medication management and follow-up.  She is currently on esomeprazole 40 mg as needed for reflux symptoms, reporting no significant issues at the current dose. She did experience symptoms on the 20 mg dose.  She has psoriatic arthritis affecting her scalp and ears, causing flakiness and itching. She uses triamcinolone ointment for these symptoms, applying it to her ears and occasionally her elbows. She also has osteoarthritis, with a history of a right knee replacement, which she describes as a 'terrible job.' She experiences significant pain and inflammation in her left knee, surgically treated for a torn meniscus last year. She uses diclofenac gel three to four times daily for pain relief, providing relief for a few hours. She is hesitant to undergo another knee replacement until absolutely necessary.  She reports plantar fasciitis and neuropathy in her feet, with symptoms of burning and stinging. She has not received clear guidance on managing these conditions and is unsure if she should see a specialist. She avoids medications like Norco or Vicodin for pain management.  She has a history of insomnia and was previously on Ambien 10 mg, which was effective without side effects. Her dose was reduced to 5 mg without discussion, which she found less effective. She experiences difficulty sleeping, often staying awake until early morning. - currently has rx from 2023 that is 5 mg  She has  a history of recurrent urinary tract infections and self-caths two to three times daily. Despite antibiotic treatment, infections recur frequently. She takes a low dose of Keflex and follows up with her urologist regularly. She also takes tamsulosin to manage urinary symptoms related to a urethral stricture.  She experiences episodes of dizziness and feeling 'sickly' in the mornings, with home blood pressure readings showing elevated levels. She previously took hydrochlorothiazide but has not used it in recent years. She monitors her blood pressure regularly and shares readings with her sister.  She has a history of IBS with diarrhea, significantly impacting her daily life. She experiences pain and frequent diarrhea after eating, requiring her to plan activities around meals. She uses essential oils for symptom relief and has been advised to try loperamide.  She has a history of diabetes and monitors her blood sugar levels, although not daily. She maintains a consistent diet and reports stable blood sugar levels.  She has a history of serous cancer, discovered during surgery. She underwent chemotherapy and has been advised to have regular follow-ups with her gynecologist. She reports no recurrence of cancer symptoms.     Medications: Outpatient Medications Prior to Visit  Medication Sig   esomeprazole (NEXIUM) 40 MG capsule Take 1 capsule (40 mg total) by mouth daily.   Semaglutide, 2 MG/DOSE, (OZEMPIC, 2 MG/DOSE,) 8 MG/3ML SOPN Inject 2 mg into the skin once a week.   [DISCONTINUED] cephALEXin (KEFLEX) 250 MG capsule Take 1 capsule (250 mg total) by mouth daily.   [DISCONTINUED] fenofibrate (  TRICOR) 145 MG tablet Take 1 tablet (145 mg total) by mouth daily.   [DISCONTINUED] rosuvastatin (CRESTOR) 20 MG tablet TAKE ONE TABLET BY MOUTH DAILY   [DISCONTINUED] tamsulosin (FLOMAX) 0.4 MG CAPS capsule TAKE ONE CAPSULE BY MOUTH DAILY   No facility-administered medications prior to visit.         Objective    BP (!) 149/84   Pulse 75   Ht 5\' 7"  (1.702 m)   Wt 187 lb (84.8 kg)   SpO2 100%   BMI 29.29 kg/m     Physical Exam Constitutional:      Appearance: Normal appearance.  HENT:     Head: Normocephalic and atraumatic.  Eyes:     General: No scleral icterus.    Conjunctiva/sclera: Conjunctivae normal.  Cardiovascular:     Pulses:          Dorsalis pedis pulses are 2+ on the right side and 2+ on the left side.       Posterior tibial pulses are 2+ on the right side and 2+ on the left side.  Musculoskeletal:     Right foot: Decreased range of motion. No deformity, bunion, Charcot foot, foot drop or prominent metatarsal heads.     Left foot: Decreased range of motion. No deformity, bunion, Charcot foot, foot drop or prominent metatarsal heads.  Feet:     Right foot:     Protective Sensation: 10 sites tested.  7 sites sensed.     Skin integrity: No ulcer, blister, skin breakdown, erythema, warmth, callus, dry skin or fissure.     Toenail Condition: Right toenails are normal.     Left foot:     Protective Sensation: 10 sites tested.  9 sites sensed.     Skin integrity: Callus (ball of foot) present. No ulcer, blister, skin breakdown, erythema, warmth, dry skin or fissure.     Toenail Condition: Left toenails are normal.  Neurological:     Mental Status: She is alert and oriented to person, place, and time. Mental status is at baseline.  Psychiatric:        Mood and Affect: Mood normal.        Behavior: Behavior normal.      No results found for any visits on 06/18/23.  Assessment & Plan    Type 2 diabetes mellitus with diabetic polyneuropathy, without long-term current use of insulin (HCC) Assessment & Plan: Diabetes managed with lifestyle modifications. Requests new glucose meter and supplies. - Order new glucose meter and supplies.  Orders: -     Blood Glucose Monitoring Suppl; 1 each by Does not apply route daily before breakfast. May substitute to any  manufacturer covered by patient's insurance.  Dispense: 1 each; Refill: 0 -     Blood Glucose Test; 1 each by In Vitro route daily before breakfast. May substitute to any manufacturer covered by patient's insurance.  Dispense: 100 strip; Refill: 3 -     Lancet Device; 1 each by Does not apply route daily before breakfast. May substitute to any manufacturer covered by patient's insurance.  Dispense: 1 each; Refill: 0 -     Lancets Misc.; 1 each by Does not apply route daily before breakfast. May substitute to any manufacturer covered by patient's insurance.  Dispense: 100 each; Refill: 3 -     Alcohol Swabs; 1 each by Does not apply route daily before breakfast.  Dispense: 100 each; Refill: 11  Transition of care Assessment & Plan: Due for routine screenings and vaccinations.  Hesitant about shingles vaccine due to previous contraindications. - Order mammogram. - Encourage follow-up with OB-GYN for Pap smear. - Discuss shingles and tetanus vaccines; get vaccines at pharmacy if she decides to proceed.   Hypertension associated with diabetes (HCC) Assessment & Plan: Intermittent hypertension with recent elevated readings. Considering reintroduction of hydrochlorothiazide due to dizziness and elevated blood pressure. - Prescribe hydrochlorothiazide 12.5 mg. - Monitor blood pressure at home and report changes.   Rheumatoid arthritis involving multiple sites, unspecified whether rheumatoid factor present [facility]) Assessment & Plan: Chronic, stable. Follows with rheumatology; defer to specialist management.   Hyperlipidemia associated with type 2 diabetes mellitus (HCC) -     Rosuvastatin Calcium; Take 1 tablet (20 mg total) by mouth daily.  Dispense: 90 tablet; Refill: 3 -     Fenofibrate; Take 1 tablet (145 mg total) by mouth daily.  Dispense: 90 tablet; Refill: 3  Ankylosing spondylitis, unspecified site of spine [facility]) Assessment & Plan: Noted.  No acute concerns. Follows with rheumatology;  defer to specialist management.   Angina pectoris (HCC) Assessment & Plan: History of chronic, atypical chest pain. Denies recent episodes. Continue to monitor.    Chronic kidney disease, stage 2, mildly decreased GFR Assessment & Plan: Noted.  No acute concerns.  Continue to monitor.    Gastroesophageal reflux disease with esophagitis without hemorrhage Assessment & Plan: Noted.  No acute concerns.  Continue to monitor.  - continue esomeprazole 40 mg daily   Intertrigo -     Nystatin; Apply 1 Application topically 3 (three) times daily.  Dispense: 15 g; Refill: 1  Psoriatic arthritis (HCC) Assessment & Plan: Chronic psoriatic arthritis affecting scalp and ears, managed with triamcinolone cream. - Continue triamcinolone cream as needed.  Orders: -     Triamcinolone Acetonide; Apply 1 Application topically 2 (two) times daily.  Dispense: 30 g; Refill: 1  Old tear of medial meniscus of left knee, unspecified tear type Assessment & Plan: Patient uses diclofenac as needed; wants to avoid PO meds and surgery for as long as possible.  Orders: -     Diclofenac Sodium; Apply 2 g topically 4 (four) times daily as needed.  Dispense: 100 g; Refill: 1  Anastomotic stricture of urinary tract Assessment & Plan: Follows with urology.  - Continue tamsulosin as prescribed - Continue self-catheterization.   Orders: -     Tamsulosin HCl; Take 1 capsule (0.4 mg total) by mouth daily.  Dispense: 90 capsule; Refill: 3  Encounter for screening mammogram for breast cancer -     3D Screening Mammogram, Left and Right; Future  Irritable bowel syndrome with diarrhea Assessment & Plan: IBS with predominant diarrhea causing significant lifestyle impact. Open to new medications for symptom management. - Prescribe loperamide 45 minutes before meals. - Consider peppermint oil capsules.  Orders: -     Loperamide HCl; Take 1 capsule (2 mg total) by mouth 3 (three) times daily as needed for  diarrhea or loose stools (45 minutes before meal).  Dispense: 30 capsule; Refill: 0  Plantar fasciitis Assessment & Plan: Chronic plantar fasciitis causing significant foot pain. - Provide exercises for plantar fasciitis. - Recommend freezing a water bottle for foot rolling.   Primary osteoarthritis of right knee Assessment & Plan: Severe osteoarthritis with right knee replacement and left knee meniscus tear causing significant pain and inflammation. She prefers to avoid narcotics and is hesitant about another knee replacement. - Continue diclofenac gel 3-4 times daily. - Consider knee replacement if mobility severely impaired.  Recurrent urinary tract infection Assessment & Plan: Chronic UTIs associated with self-catheterization, managed with prophylactic antibiotics and supplements. - Continue current antibiotic regimen. - Follow up with urologist.     Return in about 3 months (around 09/18/2023) for mAWV (w/AWV nurse if available, HTN, DM.      I discussed the assessment and treatment plan with the patient  The patient was provided an opportunity to ask questions and all were answered. The patient agreed with the plan and demonstrated an understanding of the instructions.   The patient was advised to call back or seek an in-person evaluation if the symptoms worsen or if the condition fails to improve as anticipated.  Total time was 50 minutes. That includes chart review before the visit, the actual patient visit, and time spent on documentation after the visit.    Sherlyn Hay, DO  [facility] [phone] (phone) [phone] (fax)  [facility]

## 2023-06-18 NOTE — Assessment & Plan Note (Addendum)
 Chronic psoriatic arthritis affecting scalp and ears, managed with triamcinolone cream. - Continue triamcinolone cream as needed.

## 2023-06-18 NOTE — Assessment & Plan Note (Signed)
 Diabetes managed with lifestyle modifications. Requests new glucose meter and supplies. - Order new glucose meter and supplies.

## 2023-06-18 NOTE — Assessment & Plan Note (Signed)
 Patient uses diclofenac as needed; wants to avoid PO meds and surgery for as long as possible.

## 2023-06-18 NOTE — Assessment & Plan Note (Signed)
 Noted.  No acute concerns.  Continue to monitor.

## 2023-06-18 NOTE — Assessment & Plan Note (Signed)
 Intermittent hypertension with recent elevated readings. Considering reintroduction of hydrochlorothiazide due to dizziness and elevated blood pressure. - Prescribe hydrochlorothiazide 12.5 mg. - Monitor blood pressure at home and report changes.

## 2023-06-18 NOTE — Assessment & Plan Note (Signed)
 History of chronic, atypical chest pain. Denies recent episodes. Continue to monitor.

## 2023-06-18 NOTE — Assessment & Plan Note (Signed)
 Follows with urology.  - Continue tamsulosin as prescribed - Continue self-catheterization.

## 2023-06-18 NOTE — Assessment & Plan Note (Addendum)
 Chronic, stable. Follows with rheumatology; defer to specialist management.

## 2023-06-18 NOTE — Assessment & Plan Note (Signed)
 Due for routine screenings and vaccinations. Hesitant about shingles vaccine due to previous contraindications. - Order mammogram. - Encourage follow-up with OB-GYN for Pap smear. - Discuss shingles and tetanus vaccines; get vaccines at pharmacy if she decides to proceed.

## 2023-06-18 NOTE — Assessment & Plan Note (Signed)
 IBS with predominant diarrhea causing significant lifestyle impact. Open to new medications for symptom management. - Prescribe loperamide 45 minutes before meals. - Consider peppermint oil capsules.

## 2023-06-18 NOTE — Patient Instructions (Addendum)
 Please follow up with OBGYN.  Use frozen water bottles to roll your foot on to help your plantar fasciitis

## 2023-06-18 NOTE — Telephone Encounter (Signed)
 1 month Rx sent.   Unable to inform pt due to mailbox being full

## 2023-06-18 NOTE — Assessment & Plan Note (Signed)
 Noted.  No acute concerns.  Continue to monitor.  - continue esomeprazole 40 mg daily

## 2023-06-18 NOTE — Assessment & Plan Note (Signed)
 Chronic UTIs associated with self-catheterization, managed with prophylactic antibiotics and supplements. - Continue current antibiotic regimen. - Follow up with urologist.

## 2023-06-18 NOTE — Assessment & Plan Note (Signed)
 Severe osteoarthritis with right knee replacement and left knee meniscus tear causing significant pain and inflammation. She prefers to avoid narcotics and is hesitant about another knee replacement. - Continue diclofenac gel 3-4 times daily. - Consider knee replacement if mobility severely impaired.

## 2023-06-19 LAB — COMPREHENSIVE METABOLIC PANEL
ALT: 12 IU/L (ref 0–32)
AST: 18 IU/L (ref 0–40)
Albumin: 4.5 g/dL (ref 3.9–4.9)
Alkaline Phosphatase: 100 IU/L (ref 44–121)
BUN/Creatinine Ratio: 14 (ref 12–28)
BUN: 12 mg/dL (ref 8–27)
Bilirubin Total: 0.4 mg/dL (ref 0.0–1.2)
CO2: 22 mmol/L (ref 20–29)
Calcium: 9.8 mg/dL (ref 8.7–10.3)
Chloride: 102 mmol/L (ref 96–106)
Creatinine, Ser: 0.88 mg/dL (ref 0.57–1.00)
Globulin, Total: 2.7 g/dL (ref 1.5–4.5)
Glucose: 91 mg/dL (ref 70–99)
Potassium: 3.9 mmol/L (ref 3.5–5.2)
Sodium: 139 mmol/L (ref 134–144)
Total Protein: 7.2 g/dL (ref 6.0–8.5)
eGFR: 71 mL/min/{1.73_m2} (ref >59)

## 2023-06-19 LAB — VITAMIN D 25 HYDROXY (VIT D DEFICIENCY, FRACTURES): Vit D, 25-Hydroxy: 38.3 ng/mL (ref 30.0–100.0)

## 2023-06-19 LAB — LIPID PANEL
Chol/HDL Ratio: 3.6 ratio (ref 0.0–4.4)
Cholesterol, Total: 145 mg/dL (ref 100–199)
HDL: 40 mg/dL (ref >39)
LDL Chol Calc (NIH): 71 mg/dL (ref 0–99)
Triglycerides: 203 mg/dL — ABNORMAL HIGH (ref 0–149)
VLDL Cholesterol Cal: 34 mg/dL (ref 5–40)

## 2023-06-19 LAB — HEMOGLOBIN A1C
Est. average glucose Bld gHb Est-mCnc: 117 mg/dL
Hgb A1c MFr Bld: 5.7 % — ABNORMAL HIGH (ref 4.8–5.6)

## 2023-06-19 LAB — MICROALBUMIN / CREATININE URINE RATIO
Creatinine, Urine: 57.1 mg/dL
Microalb/Creat Ratio: 36 mg/g{creat} — ABNORMAL HIGH (ref 0–29)
Microalbumin, Urine: 20.3 ug/mL

## 2023-06-19 LAB — VITAMIN B12: Vitamin B-12: 518 pg/mL (ref 232–1245)

## 2023-06-29 ENCOUNTER — Encounter: Payer: Self-pay | Admitting: Family Medicine

## 2023-07-02 ENCOUNTER — Ambulatory Visit (INDEPENDENT_AMBULATORY_CARE_PROVIDER_SITE_OTHER): Admitting: Urology

## 2023-07-02 VITALS — BP 113/73 | HR 105 | Ht 67.0 in | Wt 185.0 lb

## 2023-07-02 DIAGNOSIS — N3020 Other chronic cystitis without hematuria: Secondary | ICD-10-CM | POA: Diagnosis not present

## 2023-07-02 MED ORDER — CEPHALEXIN 250 MG PO CAPS: 250.0000 mg | ORAL_CAPSULE | Freq: Every day | ORAL | 3 refills | Status: AC

## 2023-07-02 NOTE — Progress Notes (Signed)
 07/02/2023 10:10 AM   Jean Gonzales 1956/03/20 [id number]  Referring provider: Sherlyn Hay, DO [address] [place],  [place] [address]  No chief complaint on file.   HPI: [place]: Recurrent urinary tract infections with multiple positive cultures.  Vaginal atrophy with history of endometrial carcinoma.  She may have urethral stenosis from pelvic surgery.  Had normal cystoscopy and was on low-dose Macrodantin.  She catheterizes once a day and gets residuals of 500 to 700 mL.  Has been given Flomax in the past.  Normal renal x-ray December 2021   The patient had a hysterectomy followed by chemotherapy and radiation for endometrial carcinoma about 6 years ago.  It appears last 2 years she was being dilated at a rate such that she started to catheterize even daily.  Now she catheterizes once a day.  She could not get 500 cc to a liter.  She generally voids 3-4 times a day only a few cc.  She gets up once a night.  When she voids her flow was weak.  She hesitates.  She does not strain.  She does not feel empty  She gets recurrent bladder infections with dark cloudy foul-smelling urine that respond favorably antibiotics.  She is in serious infection at least monthly or more often.  Few days later symptoms returned.  No pain or fever   Her urethral stenosis has dated back before 2019 when I reviewed the consult note.  She has rare incontinence and wears a pad for confidence.   She is convinced that antibiotics are not working.  It appears she had been on Macrodantin for 3 months but others have not been tried     We talked about colonization and over treating mild symptoms.  Educated patient about pathophysiology of recurrent urinary tract infections superimposed on high residuals and catheterizing.  Reassess in 6 weeks on daily Keflex 250 mg.  She gets a rash from sulfa.  Trimethoprim potentially an option.  Reeducation regarding history of discussed.  Probiotics  discussed.  Cranberry discussed.  Reassess 6-week   I do not think there is any role for urodynamics or sacral nerve stimulation at this stage   Patient was on the daily Keflex and still had foul-smelling cloudy urine.  She is hoping to get an antibiotic for 7 to 14 days to cleared up and she is trying an over-the-counter preparation that coats the lining of the bladder.  She still catheterizes once a day.  I called in ciprofloxacin 250 mg twice a day for 10 days.  She understands that we may not be able to prevent her urine from being foul-smelling and cloudy but we have limited tools including daily Keflex.  She has been on Macrodantin daily in the past.     call if urine culture differs.  Called in ciprofloxacin for 10 days.  Utilize over-the-counter preparation.  Continue to catheterize once a day.  We both agreed to stay on the daily Keflex.  She will increase catheterization to once in the morning and once at night hoping this helps some.  In the past she is actually done 3 times a day which is probably not necessary.  She understands prophylaxis options are limited.   Today Incomplete bladder emptying stable.  Last culture positive.  Patient has done quite well on catheterizing twice a day, once a day Keflex and taking over-the-counter UQORA twice a day.  She was doing really well until she got COVID on Christmas Day.  She is back on track and clinically not infected     Today Patient incomplete emptying stable.  Last culture was positive but she is colonized.  She has little bit of sediment in her urine that she is well aware of.  Clinically she doing great on daily Keflex and the UQORA which helps keeps the urine more clear.   She does not know why but her primary send her to nephrology.  I do not see anything obvious in the urinalysis.  She had a recent positive culture but again she is colonized.    Today Incomplete bladder emptying stable.  Renal ultrasound July 2024 normal kidneys.   Still catheterizes to today.  Had a urine culture at family medicine last week and was told it was normal.  Clinically not infected.  She thinks the Darrold Junker helps with the once a day cephalexin    PMH: Past Medical History:  Diagnosis Date   Ankylosing spondylitis (HCC)    Arthritis    osteoarthritis right knee   Diet-controlled type 2 diabetes mellitus (HCC)    Endometrial cancer (HCC) 2014   GERD (gastroesophageal reflux disease)    History of kidney stones    History of recurrent UTIs    a.) r/t self catheterization techniques and frequency; b.) followed by urology and is on daily suppressive antimicrobial therapy   Hx of migraine headaches    Hyperlipidemia    Hypertension    improved after ca tx   Intermittent self-catheterization of bladder    a.) required following XRT causing stenosis of urinary meatus; b.) self caths TWICE daily   Psoriatic arthritis (HCC)    Urethral meatal stenosis    a.) following XRT for endometrial cancer    Surgical History: Past Surgical History:  Procedure Laterality Date   ABDOMINAL HYSTERECTOMY  2015   cancer   BREAST BIOPSY Right 2008ish   core   CATARACT EXTRACTION W/PHACO Right 02/05/2018   Procedure: CATARACT EXTRACTION PHACO AND INTRAOCULAR LENS PLACEMENT (IOC) RIGHT DIABETES IVA TOPICAL;  Surgeon: Nevada Crane, MD;  Location: [facility];  Service: Ophthalmology;  Laterality: Right;   CATARACT EXTRACTION W/PHACO Left 02/25/2018   Procedure: CATARACT EXTRACTION PHACO AND INTRAOCULAR LENS PLACEMENT (IOC) LEFT DIABETIC;  Surgeon: Nevada Crane, MD;  Location: [facility];  Service: Ophthalmology;  Laterality: Left;   CHOLECYSTECTOMY  1992   COLONOSCOPY WITH PROPOFOL N/A 08/08/2019   Procedure: COLONOSCOPY WITH PROPOFOL;  Surgeon: Wyline Mood, MD;  Location: [facility];  Service: Gastroenterology;  Laterality: N/A;   EYE SURGERY     KNEE ARTHROSCOPY WITH MEDIAL MENISECTOMY Left 12/06/2021   Procedure: KNEE  ARTHROSCOPY WITH PARTIAL MEDIAL LATERAL MENISECTOMY, PARTIAL SYNOVECTOMY, LOOSE BODY REMOVAL;  Surgeon: Lyndle Herrlich, MD;  Location: [facility];  Service: Orthopedics;  Laterality: Left;   TONSILLECTOMY  1973   TONSILLECTOMY     TOTAL KNEE ARTHROPLASTY Right 07/09/2017   Procedure: TOTAL KNEE ARTHROPLASTY;  Surgeon: Lyndle Herrlich, MD;  Location: [facility];  Service: Orthopedics;  Laterality: Right;    Home Medications:  Allergies as of 07/02/2023       Reactions   Iodinated Contrast Media Anaphylaxis   Bactrim [sulfamethoxazole-trimethoprim] Itching, Rash        Medication List        Accurate as of July 02, 2023 10:10 AM. If you have any questions, ask your nurse or doctor.          Alcohol Swabs 70 % Pads 1 each by  Does not apply route daily before breakfast.   Blood Glucose Monitoring Suppl Devi 1 each by Does not apply route daily before breakfast. May substitute to any manufacturer covered by patient's insurance.   BLOOD GLUCOSE TEST STRIPS Strp 1 each by In Vitro route daily before breakfast. May substitute to any manufacturer covered by patient's insurance.   cephALEXin 250 MG capsule Commonly known as: KEFLEX Take 1 capsule (250 mg total) by mouth daily.   diclofenac Sodium 1 % Gel Commonly known as: VOLTAREN Apply 2 g topically 4 (four) times daily as needed.   esomeprazole 40 MG capsule Commonly known as: NEXIUM Take 1 capsule (40 mg total) by mouth daily.   fenofibrate 145 MG tablet Commonly known as: Tricor Take 1 tablet (145 mg total) by mouth daily.   Lancet Device Misc 1 each by Does not apply route daily before breakfast. May substitute to any manufacturer covered by patient's insurance.   Lancets Misc. Misc 1 each by Does not apply route daily before breakfast. May substitute to any manufacturer covered by patient's insurance.   loperamide 2 MG capsule Commonly known as: IMODIUM Take 1 capsule (2 mg total) by mouth 3 (three) times daily as  needed for diarrhea or loose stools (45 minutes before meal).   nystatin powder Commonly known as: MYCOSTATIN/NYSTOP Apply 1 Application topically 3 (three) times daily.   Ozempic (2 MG/DOSE) 8 MG/3ML Sopn Generic drug: Semaglutide (2 MG/DOSE) Inject 2 mg into the skin once a week.   rosuvastatin 20 MG tablet Commonly known as: CRESTOR Take 1 tablet (20 mg total) by mouth daily.   tamsulosin 0.4 MG Caps capsule Commonly known as: FLOMAX Take 1 capsule (0.4 mg total) by mouth daily.   triamcinolone ointment 0.1 % Commonly known as: KENALOG Apply 1 Application topically 2 (two) times daily.        Allergies:  Allergies  Allergen Reactions   Iodinated Contrast Media Anaphylaxis   Bactrim [Sulfamethoxazole-Trimethoprim] Itching and Rash    Family History: Family History  Problem Relation Age of Onset   Diabetes Mother    Cancer Mother    Heart disease Father    Breast cancer Neg Hx    Kidney cancer Neg Hx    Bladder Cancer Neg Hx     Social History:  reports that she has never smoked. She has never been exposed to tobacco smoke. She has never used smokeless tobacco. She reports that she does not drink alcohol and does not use drugs.  ROS:                                        Physical Exam: There were no vitals taken for this visit.  Constitutional:  Alert and oriented, No acute distress. HEENT: [place] AT, moist mucus membranes.  Trachea midline, no masses.  Laboratory Data: Lab Results  Component Value Date   WBC 10.0 05/23/2022   HGB 13.6 05/23/2022   HCT 41.4 05/23/2022   MCV 91 05/23/2022   PLT 254 05/23/2022    Lab Results  Component Value Date   CREATININE 0.88 06/18/2023    No results found for: "PSA"  No results found for: "TESTOSTERONE"  Lab Results  Component Value Date   HGBA1C 5.7 (H) 06/18/2023    Urinalysis    Component Value Date/Time   COLORURINE STRAW (A) 01/01/2022 1242   APPEARANCEUR Hazy (A)  06/05/2022 1314  LABSPEC 1.003 (L) 01/01/2022 1242   PHURINE 7.0 01/01/2022 1242   GLUCOSEU Negative 06/05/2022 1314   HGBUR SMALL (A) 01/01/2022 1242   BILIRUBINUR Negative 06/05/2022 1314   KETONESUR NEGATIVE 01/01/2022 1242   PROTEINUR Negative 06/05/2022 1314   PROTEINUR NEGATIVE 01/01/2022 1242   UROBILINOGEN 0.2 09/19/2021 1340   UROBILINOGEN 0.2 01/25/2007 1533   NITRITE Negative 06/05/2022 1314   NITRITE NEGATIVE 01/01/2022 1242   LEUKOCYTESUR 2+ (A) 06/05/2022 1314   LEUKOCYTESUR LARGE (A) 01/01/2022 1242    Pertinent Imaging:   Assessment & Plan: I will renew the cephalexin 250 mg daily 90 x 3 and see in 1 year with renal ultrasound  There are no diagnoses linked to this encounter.  No follow-ups on file.  Martina Sinner, MD  [facility] [address], [address] [place], [place] [address] [phone]

## 2023-07-18 ENCOUNTER — Other Ambulatory Visit: Payer: Self-pay

## 2023-07-18 ENCOUNTER — Telehealth: Payer: Self-pay | Admitting: Family Medicine

## 2023-07-18 DIAGNOSIS — E785 Hyperlipidemia, unspecified: Secondary | ICD-10-CM

## 2023-07-18 NOTE — Telephone Encounter (Signed)
 [facility] is requesting refill rosuvastatin (CRESTOR) 20 MG tablet   Please advise

## 2023-09-19 ENCOUNTER — Ambulatory Visit (INDEPENDENT_AMBULATORY_CARE_PROVIDER_SITE_OTHER): Admitting: Family Medicine

## 2023-09-19 ENCOUNTER — Encounter: Payer: Self-pay | Admitting: Family Medicine

## 2023-09-19 VITALS — BP 112/62 | HR 72 | Resp 14 | Ht 67.0 in | Wt 185.7 lb

## 2023-09-19 DIAGNOSIS — N9989 Other postprocedural complications and disorders of genitourinary system: Secondary | ICD-10-CM | POA: Diagnosis not present

## 2023-09-19 DIAGNOSIS — M150 Primary generalized (osteo)arthritis: Secondary | ICD-10-CM | POA: Diagnosis not present

## 2023-09-19 DIAGNOSIS — Z7985 Long-term (current) use of injectable non-insulin antidiabetic drugs: Secondary | ICD-10-CM

## 2023-09-19 DIAGNOSIS — I152 Hypertension secondary to endocrine disorders: Secondary | ICD-10-CM | POA: Diagnosis not present

## 2023-09-19 DIAGNOSIS — E1159 Type 2 diabetes mellitus with other circulatory complications: Secondary | ICD-10-CM | POA: Diagnosis not present

## 2023-09-19 DIAGNOSIS — E1142 Type 2 diabetes mellitus with diabetic polyneuropathy: Secondary | ICD-10-CM | POA: Diagnosis not present

## 2023-09-19 DIAGNOSIS — F5104 Psychophysiologic insomnia: Secondary | ICD-10-CM | POA: Diagnosis not present

## 2023-09-19 MED ORDER — ZOLPIDEM TARTRATE 5 MG PO TABS
5.0000 mg | ORAL_TABLET | Freq: Every evening | ORAL | 2 refills | Status: AC | PRN
Start: 2023-09-19 — End: ?

## 2023-09-19 MED ORDER — CELECOXIB 100 MG PO CAPS: 100.0000 mg | ORAL_CAPSULE | Freq: Two times a day (BID) | ORAL | 1 refills | Status: AC | PRN

## 2023-09-19 MED ORDER — ALCOHOL SWABS 70 % PADS: 1.0000 | MEDICATED_PAD | Freq: Every day | 11 refills | Status: AC

## 2023-09-19 NOTE — Progress Notes (Signed)
 Established patient visit   Patient: Jean Gonzales   DOB: 10-25-1955   68 y.o. Female  MRN: [id number] Visit Date: 09/19/2023  Today's healthcare provider: LAURAINE LOISE BUOY, DO   Chief Complaint  Patient presents with   Diabetes    Decline AWV   Hypertension   Subjective    Diabetes  Hypertension  Jean Gonzales is a 68 year old female who presents for diabetes and hypertension follow-up.  She experiences insomnia characterized by difficulty initiating sleep and frequent nocturnal awakenings, resulting in daytime fatigue and a sensation of being 'just drugged'. Previously, zolpidem  10 mg was effective in improving her sleep quality, but the dose was reduced to 5 mg by a previous provider without explanation, which she finds frustrating as the higher dose was more effective.  Her current medication regimen includes Celebrex  once daily for arthritis-related inflammation, although it was prescribed twice daily. She finds the once-daily dose sufficient. She takes hydrochlorothiazide , which she splits to 12.5 mg due to dizziness after discussion at her previous visit; she reports no dizziness since making this change. She also takes Keflex  daily for bladder infection prevention, Voltaren  gel for knee pain, esomeprazole , fenofibrate , and rosuvastatin  for cholesterol management.  She has not taking the Imodium  due to concerns about constipation and hemorrhoids.  She is on Ozempic  for diabetes management, which has significantly improved her A1c and helped maintain her weight. She also takes tamsulosin  due to urethral stenosis from past cancer treatments, requiring self-catheterization three times a day.  She experiences mucus buildup in her throat after eating, particularly with dairy products, and has noted improvement in symptoms with dairy elimination. She has a family history of diabetes affecting several siblings and grandparents.       Medications: Outpatient  Medications Prior to Visit  Medication Sig   Blood Glucose Monitoring Suppl DEVI 1 each by Does not apply route daily before breakfast. May substitute to any manufacturer covered by patient's insurance.   cephALEXin  (KEFLEX ) 250 MG capsule Take 1 capsule (250 mg total) by mouth daily.   diclofenac  Sodium (VOLTAREN ) 1 % GEL Apply 2 g topically 4 (four) times daily as needed.   esomeprazole  (NEXIUM ) 40 MG capsule Take 1 capsule (40 mg total) by mouth daily.   fenofibrate  (TRICOR ) 145 MG tablet Take 1 tablet (145 mg total) by mouth daily.   Glucose Blood (BLOOD GLUCOSE TEST STRIPS) STRP 1 each by In Vitro route daily before breakfast. May substitute to any manufacturer covered by patient's insurance.   hydrochlorothiazide  (HYDRODIURIL ) 25 MG tablet Take 25 mg by mouth daily. (Patient taking differently: Take 12.5 mg by mouth daily.)   Lancet Device MISC 1 each by Does not apply route daily before breakfast. May substitute to any manufacturer covered by patient's insurance.   nystatin  (MYCOSTATIN /NYSTOP ) powder Apply 1 Application topically 3 (three) times daily.   rosuvastatin  (CRESTOR ) 20 MG tablet Take 1 tablet (20 mg total) by mouth daily.   Semaglutide , 2 MG/DOSE, (OZEMPIC , 2 MG/DOSE,) 8 MG/3ML SOPN Inject 2 mg into the skin once a week.   tamsulosin  (FLOMAX ) 0.4 MG CAPS capsule Take 1 capsule (0.4 mg total) by mouth daily.   triamcinolone  ointment (KENALOG ) 0.1 % Apply 1 Application topically 2 (two) times daily.   [DISCONTINUED] Alcohol  Swabs  70 % PADS 1 each by Does not apply route daily before breakfast.   [DISCONTINUED] celecoxib  (CELEBREX ) 100 MG capsule Take 100 mg by mouth 2 (two) times daily.   [DISCONTINUED] zolpidem  (  AMBIEN ) 5 MG tablet Take 5 mg by mouth at bedtime as needed for sleep.   loperamide  (IMODIUM ) 2 MG capsule Take 1 capsule (2 mg total) by mouth 3 (three) times daily as needed for diarrhea or loose stools (45 minutes before meal). (Patient not taking: Reported on 09/19/2023)    No facility-administered medications prior to visit.        Objective    BP 112/62 (BP Location: Right Arm, Patient Position: Sitting, Cuff Size: Normal)   Pulse 72   Resp 14   Ht 5' 7 (1.702 m)   Wt 185 lb 11.2 oz (84.2 kg)   SpO2 100%   BMI 29.08 kg/m     Physical Exam Vitals and nursing note reviewed.  Constitutional:      General: She is not in acute distress.    Appearance: Normal appearance.  HENT:     Head: Normocephalic and atraumatic.   Eyes:     General: No scleral icterus.    Conjunctiva/sclera: Conjunctivae normal.    Cardiovascular:     Rate and Rhythm: Normal rate.  Pulmonary:     Effort: Pulmonary effort is normal.   Neurological:     Mental Status: She is alert and oriented to person, place, and time. Mental status is at baseline.   Psychiatric:        Mood and Affect: Mood normal.        Behavior: Behavior normal.      No results found for any visits on 09/19/23.  Assessment & Plan    Type 2 diabetes mellitus with diabetic polyneuropathy, without long-term current use of insulin  (HCC) -     Alcohol  Swabs ; 1 each by Does not apply route daily before breakfast.  Dispense: 100 each; Refill: 11  Hypertension associated with diabetes (HCC)  Psychophysiological insomnia -     Zolpidem  Tartrate; Take 1 tablet (5 mg total) by mouth at bedtime as needed for sleep.  Dispense: 30 tablet; Refill: 2  Primary osteoarthritis involving multiple joints -     Celecoxib ; Take 1 capsule (100 mg total) by mouth 2 (two) times daily as needed.  Dispense: 180 capsule; Refill: 1  Anastomotic stricture of urinary tract    Insomnia Chronic insomnia with effective response to zolpidem  5 mg.  Discussed risks and benefits of continuing zolpidem  as patient continues to age.  Patient prefers to continue due to inability to sleep when not taking it and then feeling drugged in the morning due to lack of sleep. - Continue zolpidem  10 mg as needed for  sleep.  Type 2 Diabetes Mellitus with diabetic polyneuropathy Well-controlled with Ozempic . A1c improved to 5.7% at most recent check 06/18/2023. - Continue Ozempic  2 mg weekly. - Recheck A1c at next visit.  Hypertension Chronic, stable.  Controlled with hydrochlorothiazide  12.5 mg. Dizziness resolved. - Continue hydrochlorothiazide  12.5 mg daily.  Anastomotic stricture of urinary tract Status post radiation therapy. Self-catheterizes three times daily and as needed - takes tamsulosin  to facilitate urethral opening.  Follows with urology; defer to specialist management.  General Health Maintenance Shingles vaccine not administered. Bone density normal as of 07/23/2019.  Plan to recheck bone density next year. - Recommend shingles vaccine to be obtained at the pharmacy as requirements. - Recheck bone density next year.    Follow-up Follow-up in three months for monitoring and physical exam. - Schedule follow-up appointment in three months for blood work and physical exam.  Return in about 3 months (around 12/20/2023) for CPE, DM, Chronic  f/u.      I discussed the assessment and treatment plan with the patient  The patient was provided an opportunity to ask questions and all were answered. The patient agreed with the plan and demonstrated an understanding of the instructions.   The patient was advised to call back or seek an in-person evaluation if the symptoms worsen or if the condition fails to improve as anticipated.    LAURAINE LOISE BUOY, DO  [facility] [phone] (phone) [phone] (fax)  [facility]

## 2023-09-19 NOTE — Patient Instructions (Signed)
 Recommend getting the Shingrix vaccine for shingles at your pharmacy (it's a two shot series).

## 2023-10-31 DIAGNOSIS — Z008 Encounter for other general examination: Secondary | ICD-10-CM | POA: Diagnosis not present

## 2023-11-15 DIAGNOSIS — R32 Unspecified urinary incontinence: Secondary | ICD-10-CM | POA: Diagnosis not present

## 2023-11-15 DIAGNOSIS — R339 Retention of urine, unspecified: Secondary | ICD-10-CM | POA: Diagnosis not present

## 2023-12-20 ENCOUNTER — Ambulatory Visit: Admitting: Family Medicine

## 2023-12-20 VITALS — BP 114/77 | HR 70 | Temp 97.5°F | Ht 67.0 in | Wt 187.0 lb

## 2023-12-20 DIAGNOSIS — E785 Hyperlipidemia, unspecified: Secondary | ICD-10-CM

## 2023-12-20 DIAGNOSIS — Z23 Encounter for immunization: Secondary | ICD-10-CM

## 2023-12-20 DIAGNOSIS — I152 Hypertension secondary to endocrine disorders: Secondary | ICD-10-CM | POA: Diagnosis not present

## 2023-12-20 DIAGNOSIS — E1159 Type 2 diabetes mellitus with other circulatory complications: Secondary | ICD-10-CM | POA: Diagnosis not present

## 2023-12-20 DIAGNOSIS — E1142 Type 2 diabetes mellitus with diabetic polyneuropathy: Secondary | ICD-10-CM | POA: Diagnosis not present

## 2023-12-20 DIAGNOSIS — E1169 Type 2 diabetes mellitus with other specified complication: Secondary | ICD-10-CM

## 2023-12-20 DIAGNOSIS — R079 Chest pain, unspecified: Secondary | ICD-10-CM

## 2023-12-20 DIAGNOSIS — R8290 Unspecified abnormal findings in urine: Secondary | ICD-10-CM | POA: Diagnosis not present

## 2023-12-20 DIAGNOSIS — I498 Other specified cardiac arrhythmias: Secondary | ICD-10-CM

## 2023-12-20 NOTE — Progress Notes (Signed)
 Established patient visit   Patient: Jean Gonzales   DOB: 02/08/1956   68 y.o. Female  MRN: [id number] Visit Date: 12/20/2023  Today's healthcare provider: LAURAINE LOISE BUOY, DO   Chief Complaint  Patient presents with   Diabetes   Urinary Tract Infection   Subjective    Diabetes  Urinary Tract Infection    Jean Gonzales is a 68 year old female with atrial fibrillation and diabetes who presents for a physical exam and follow-up on her heart monitor results.  She wore a heart monitor for fourteen days and did not experience any episodes of irregular heartbeat or palpitations during this period. However, she frequently experiences a tightening sensation in her chest. She has a history of atrial fibrillation and is not established with cardiology.  She has a history of diabetes and previously had her A1c and blood work done every three months. Currently, she is on Ozempic , which she believes has her diabetes under control, but she is unable to check her blood sugar due to issues with her glucose monitor. Her last A1c was 5.7. She experiences fluctuations in how she feels, which she attributes to her blood pressure rather than low blood sugar.  She reports she has an eye exam scheduled in the near-future.  She takes a low-dose antibiotic daily for recurrent urinary tract infections and supplements with a product that she believes helps cleanse her bladder. Despite this, she still experiences cloudy urine and requires stronger antibiotics periodically.  She experiences significant sleep disturbances, waking up multiple times a night despite using Ambien . She was previously on 10 mg but is now on 5 mg, which she finds ineffective. She also uses essential oils to aid sleep but still feels tired during the day and occasionally naps.  She reports dizziness, described as waves that make her feel like she might pass out. This occurs almost daily, often when she is sitting or  after standing up. She associates these episodes with her blood pressure, which she monitors at home. She takes hydrochlorothiazide  12.5 mg, which she sometimes halves or skips if she feels dizzy.  She is active, engaging in activities such as walking her dog, gardening, and household chores. She has experienced improvement in her knee inflammation, which she attributes to a supplement containing ashwagandha. This has allowed her to be more active, including going to the grocery store and walking without pain.       Medications: Outpatient Medications Prior to Visit  Medication Sig   Alcohol  Swabs  70 % PADS 1 each by Does not apply route daily before breakfast.   Blood Glucose Monitoring Suppl DEVI 1 each by Does not apply route daily before breakfast. May substitute to any manufacturer covered by patient's insurance.   celecoxib  (CELEBREX ) 100 MG capsule Take 1 capsule (100 mg total) by mouth 2 (two) times daily as needed.   cephALEXin  (KEFLEX ) 250 MG capsule Take 1 capsule (250 mg total) by mouth daily.   diclofenac  Sodium (VOLTAREN ) 1 % GEL Apply 2 g topically 4 (four) times daily as needed.   esomeprazole  (NEXIUM ) 40 MG capsule Take 1 capsule (40 mg total) by mouth daily.   fenofibrate  (TRICOR ) 145 MG tablet Take 1 tablet (145 mg total) by mouth daily.   Glucose Blood (BLOOD GLUCOSE TEST STRIPS) STRP 1 each by In Vitro route daily before breakfast. May substitute to any manufacturer covered by patient's insurance.   hydrochlorothiazide  (HYDRODIURIL ) 25 MG tablet Take 25 mg by  mouth daily.   Lancet Device MISC 1 each by Does not apply route daily before breakfast. May substitute to any manufacturer covered by patient's insurance.   nystatin  (MYCOSTATIN /NYSTOP ) powder Apply 1 Application topically 3 (three) times daily.   rosuvastatin  (CRESTOR ) 20 MG tablet Take 1 tablet (20 mg total) by mouth daily.   Semaglutide , 2 MG/DOSE, (OZEMPIC , 2 MG/DOSE,) 8 MG/3ML SOPN Inject 2 mg into the skin  once a week.   tamsulosin  (FLOMAX ) 0.4 MG CAPS capsule Take 1 capsule (0.4 mg total) by mouth daily.   triamcinolone  ointment (KENALOG ) 0.1 % Apply 1 Application topically 2 (two) times daily.   zolpidem  (AMBIEN ) 5 MG tablet Take 1 tablet (5 mg total) by mouth at bedtime as needed for sleep.   [DISCONTINUED] loperamide  (IMODIUM ) 2 MG capsule Take 1 capsule (2 mg total) by mouth 3 (three) times daily as needed for diarrhea or loose stools (45 minutes before meal). (Patient not taking: Reported on 12/20/2023)   No facility-administered medications prior to visit.        Objective    BP 114/77 (BP Location: Right Arm, Patient Position: Sitting, Cuff Size: Normal)   Pulse 70   Temp (!) 97.5 F (36.4 C) (Oral)   Ht 5' 7 (1.702 m)   Wt 187 lb (84.8 kg)   SpO2 100%   BMI 29.29 kg/m     Physical Exam Constitutional:      Appearance: Normal appearance.  HENT:     Head: Normocephalic and atraumatic.  Eyes:     General: No scleral icterus.    Extraocular Movements: Extraocular movements intact.     Conjunctiva/sclera: Conjunctivae normal.  Cardiovascular:     Rate and Rhythm: Normal rate and regular rhythm.     Pulses: Normal pulses.     Heart sounds: Normal heart sounds.  Pulmonary:     Effort: Pulmonary effort is normal. No respiratory distress.     Breath sounds: Normal breath sounds.  Musculoskeletal:     Right lower leg: No edema.     Left lower leg: No edema.  Skin:    General: Skin is warm and dry.  Neurological:     Mental Status: She is alert and oriented to person, place, and time. Mental status is at baseline.  Psychiatric:        Mood and Affect: Mood normal.        Behavior: Behavior normal.      Results for orders placed or performed in visit on 12/20/23  Urine Culture   UR  Result Value Ref Range   Urine Culture, Routine Final report (A)    Organism ID, Bacteria Citrobacter freundii (A)    ORGANISM ID, BACTERIA Not applicable    Antimicrobial  Susceptibility Comment   Comprehensive metabolic panel with GFR  Result Value Ref Range   Glucose 90 70 - 99 mg/dL   BUN 27 8 - 27 mg/dL   Creatinine, Ser 8.38 (H) 0.57 - 1.00 mg/dL   eGFR 34 (L) >40 fO/fpw/8.26   BUN/Creatinine Ratio 17 12 - 28   Sodium 139 134 - 144 mmol/L   Potassium 4.4 3.5 - 5.2 mmol/L   Chloride 102 96 - 106 mmol/L   CO2 22 20 - 29 mmol/L   Calcium  9.8 8.7 - 10.3 mg/dL   Total Protein 7.0 6.0 - 8.5 g/dL   Albumin  4.5 3.9 - 4.9 g/dL   Globulin, Total 2.5 1.5 - 4.5 g/dL   Bilirubin Total 0.3 0.0 - 1.2 mg/dL  Alkaline Phosphatase 75 49 - 135 IU/L   AST 23 0 - 40 IU/L   ALT 22 0 - 32 IU/L  Hemoglobin A1c  Result Value Ref Range   Hgb A1c MFr Bld 5.6 4.8 - 5.6 %   Est. average glucose Bld gHb Est-mCnc 114 mg/dL  Lipid Panel With LDL/HDL Ratio  Result Value Ref Range   Cholesterol, Total 214 (H) 100 - 199 mg/dL   Triglycerides 722 (H) 0 - 149 mg/dL   HDL 47 >60 mg/dL   VLDL Cholesterol Cal 48 (H) 5 - 40 mg/dL   LDL Chol Calc (NIH) 880 (H) 0 - 99 mg/dL   LDL/HDL Ratio 2.5 0.0 - 3.2 ratio    Assessment & Plan    Type 2 diabetes mellitus with diabetic polyneuropathy, without long-term current use of insulin  (HCC) -     Hemoglobin A1c  Hyperlipidemia associated with type 2 diabetes mellitus (HCC) -     Lipid Panel With LDL/HDL Ratio  Hypertension associated with diabetes (HCC) -     Comprehensive metabolic panel with GFR  Chest pain, unspecified type -     Ambulatory referral to Cardiology  Ventricular trigeminy -     Ambulatory referral to Cardiology  Cloudy urine -     Urine Culture -     Urine Culture  Need for influenza vaccination     Type 2 diabetes mellitus with diabetic polyneuropathy and circulatory complications A1c controlled at 5.7% with Ozempic . Unable to monitor glucose at home. No hypoglycemia reported. - Continue Ozempic  as prescribed. - Address glucose monitor issues for home monitoring.  Hypertension associated with  diabetes Blood pressure generally controlled. Dizziness when blood pressure is lower. Halved hydrochlorothiazide  due to dizziness. - Discontinue hydrochlorothiazide . - Monitor home blood pressure and dizziness. - Record home blood pressure and dizziness patterns for follow-up.  Chest pain, unspecified type; ventricular trigeminy Episode of ventricular trigeminy, lasting 23 seconds, noted on recent long-term monitor obtained by insurance health evaluation.  Will refer to cardiology for further evaluation.  Cloudy urine Cloudy urine and recurrent infections. On low-dose antibiotics and supplement regimen. - Continue low-dose antibiotic regimen. - Continue supplement regimen as discussed with urologist.  Insomnia Difficulty maintaining sleep. Ambien  5 mg less effective. Uses essential oils. - Discuss potential adjustment of Ambien  dosage.  Will continue current dose due to significant risk posed by increased dose.  Hypertriglyceridemia; hyperlipidemia associated with type 2 diabetes mellitus Difficulty with fenofibrate  due to pill size. Triglyceride levels due to be rechecked. - Order lipid panel to assess triglyceride levels.     Return in about 2 months (around 02/19/2024) for HTN, dizziness.      I discussed the assessment and treatment plan with the patient  The patient was provided an opportunity to ask questions and all were answered. The patient agreed with the plan and demonstrated an understanding of the instructions.   The patient was advised to call back or seek an in-person evaluation if the symptoms worsen or if the condition fails to improve as anticipated.    LAURAINE LOISE BUOY, DO  [facility] [phone] (phone) [phone] (fax)  [facility]

## 2023-12-20 NOTE — Patient Instructions (Addendum)
 Monitor blood pressure at home and monitor dizziness (record them), to see if there is definitely a pattern between your pressures and your dizziness.  Call cardiology to schedule; referral sent just in case you are not considered an established patient any more.  Please schedule you diabetic eye exam ASAP.

## 2023-12-25 LAB — LIPID PANEL WITH LDL/HDL RATIO
Cholesterol, Total: 214 mg/dL — ABNORMAL HIGH (ref 100–199)
HDL: 47 mg/dL (ref >39)
LDL Chol Calc (NIH): 119 mg/dL — ABNORMAL HIGH (ref 0–99)
LDL/HDL Ratio: 2.5 ratio (ref 0.0–3.2)
Triglycerides: 277 mg/dL — ABNORMAL HIGH (ref 0–149)
VLDL Cholesterol Cal: 48 mg/dL — ABNORMAL HIGH (ref 5–40)

## 2023-12-25 LAB — COMPREHENSIVE METABOLIC PANEL WITH GFR
ALT: 22 IU/L (ref 0–32)
AST: 23 IU/L (ref 0–40)
Albumin: 4.5 g/dL (ref 3.9–4.9)
Alkaline Phosphatase: 75 IU/L (ref 49–135)
BUN/Creatinine Ratio: 17 (ref 12–28)
BUN: 27 mg/dL (ref 8–27)
Bilirubin Total: 0.3 mg/dL (ref 0.0–1.2)
CO2: 22 mmol/L (ref 20–29)
Calcium: 9.8 mg/dL (ref 8.7–10.3)
Chloride: 102 mmol/L (ref 96–106)
Creatinine, Ser: 1.61 mg/dL — ABNORMAL HIGH (ref 0.57–1.00)
Globulin, Total: 2.5 g/dL (ref 1.5–4.5)
Glucose: 90 mg/dL (ref 70–99)
Potassium: 4.4 mmol/L (ref 3.5–5.2)
Sodium: 139 mmol/L (ref 134–144)
Total Protein: 7 g/dL (ref 6.0–8.5)
eGFR: 34 mL/min/1.73 — ABNORMAL LOW (ref >59)

## 2023-12-25 LAB — URINE CULTURE

## 2023-12-25 LAB — HEMOGLOBIN A1C
Est. average glucose Bld gHb Est-mCnc: 114 mg/dL
Hgb A1c MFr Bld: 5.6 % (ref 4.8–5.6)

## 2023-12-27 ENCOUNTER — Ambulatory Visit
Admission: RE | Admit: 2023-12-27 | Discharge: 2023-12-27 | Disposition: A | Discharge status: Home / Self Care | Source: Ambulatory Visit | Attending: Family Medicine | Admitting: Family Medicine

## 2023-12-27 DIAGNOSIS — N3020 Other chronic cystitis without hematuria: Secondary | ICD-10-CM | POA: Insufficient documentation

## 2023-12-27 DIAGNOSIS — R9341 Abnormal radiologic findings on diagnostic imaging of renal pelvis, ureter, or bladder: Secondary | ICD-10-CM | POA: Diagnosis not present

## 2023-12-27 DIAGNOSIS — Z1231 Encounter for screening mammogram for malignant neoplasm of breast: Secondary | ICD-10-CM | POA: Insufficient documentation

## 2023-12-28 ENCOUNTER — Ambulatory Visit: Payer: Self-pay | Admitting: Family Medicine

## 2023-12-28 ENCOUNTER — Ambulatory Visit
Admission: RE | Admit: 2023-12-28 | Discharge: 2023-12-28 | Disposition: A | Discharge status: Home / Self Care | Source: Ambulatory Visit | Attending: Urology | Admitting: Urology

## 2023-12-28 DIAGNOSIS — Z1231 Encounter for screening mammogram for malignant neoplasm of breast: Secondary | ICD-10-CM | POA: Diagnosis not present

## 2023-12-28 DIAGNOSIS — N3020 Other chronic cystitis without hematuria: Secondary | ICD-10-CM | POA: Diagnosis not present

## 2023-12-28 DIAGNOSIS — N390 Urinary tract infection, site not specified: Secondary | ICD-10-CM

## 2023-12-28 DIAGNOSIS — R9341 Abnormal radiologic findings on diagnostic imaging of renal pelvis, ureter, or bladder: Secondary | ICD-10-CM | POA: Diagnosis not present

## 2023-12-28 NOTE — Progress Notes (Incomplete)
 Established patient visit   Patient: Jean Gonzales   DOB: February 25, 1956   68 y.o. Female  MRN: [id number] Visit Date: 12/20/2023  Today's healthcare provider: LAURAINE LOISE BUOY, DO   Chief Complaint  Patient presents with  . Diabetes  . Urinary Tract Infection   Subjective    Diabetes  Urinary Tract Infection    Jean Gonzales is a 68 year old female with atrial fibrillation and diabetes who presents for a physical exam and follow-up on her heart monitor results.  She wore a heart monitor for fourteen days and did not experience any episodes of irregular heartbeat or palpitations during this period. However, she frequently experiences a tightening sensation in her chest. She has a history of atrial fibrillation and is not established with cardiology.  She has a history of diabetes and previously had her A1c and blood work done every three months. Currently, she is on Ozempic , which she believes has her diabetes under control, but she is unable to check her blood sugar due to issues with her glucose monitor. Her last A1c was 5.7. She experiences fluctuations in how she feels, which she attributes to her blood pressure rather than low blood sugar.  She reports she has an eye exam scheduled in the near-future.  She takes a low-dose antibiotic daily for recurrent urinary tract infections and supplements with a product that she believes helps cleanse her bladder. Despite this, she still experiences cloudy urine and requires stronger antibiotics periodically.  She experiences significant sleep disturbances, waking up multiple times a night despite using Ambien . She was previously on 10 mg but is now on 5 mg, which she finds ineffective. She also uses essential oils to aid sleep but still feels tired during the day and occasionally naps.  She reports dizziness, described as waves that make her feel like she might pass out. This occurs almost daily, often when she is sitting  or after standing up. She associates these episodes with her blood pressure, which she monitors at home. She takes hydrochlorothiazide  12.5 mg, which she sometimes halves or skips if she feels dizzy.  She is active, engaging in activities such as walking her dog, gardening, and household chores. She has experienced improvement in her knee inflammation, which she attributes to a supplement containing ashwagandha. This has allowed her to be more active, including going to the grocery store and walking without pain.     {History (Optional):23778}  Medications: Outpatient Medications Prior to Visit  Medication Sig  . Alcohol  Swabs  70 % PADS 1 each by Does not apply route daily before breakfast.  . Blood Glucose Monitoring Suppl DEVI 1 each by Does not apply route daily before breakfast. May substitute to any manufacturer covered by patient's insurance.  . celecoxib  (CELEBREX ) 100 MG capsule Take 1 capsule (100 mg total) by mouth 2 (two) times daily as needed.  . cephALEXin  (KEFLEX ) 250 MG capsule Take 1 capsule (250 mg total) by mouth daily.  . diclofenac  Sodium (VOLTAREN ) 1 % GEL Apply 2 g topically 4 (four) times daily as needed.  . esomeprazole  (NEXIUM ) 40 MG capsule Take 1 capsule (40 mg total) by mouth daily.  . fenofibrate  (TRICOR ) 145 MG tablet Take 1 tablet (145 mg total) by mouth daily.  . Glucose Blood (BLOOD GLUCOSE TEST STRIPS) STRP 1 each by In Vitro route daily before breakfast. May substitute to any manufacturer covered by patient's insurance.  . hydrochlorothiazide  (HYDRODIURIL ) 25 MG tablet Take 25 mg  by mouth daily.  SABRA Ora Device MISC 1 each by Does not apply route daily before breakfast. May substitute to any manufacturer covered by patient's insurance.  . nystatin  (MYCOSTATIN /NYSTOP ) powder Apply 1 Application topically 3 (three) times daily.  . rosuvastatin  (CRESTOR ) 20 MG tablet Take 1 tablet (20 mg total) by mouth daily.  . Semaglutide , 2 MG/DOSE, (OZEMPIC , 2 MG/DOSE,) 8  MG/3ML SOPN Inject 2 mg into the skin once a week.  . tamsulosin  (FLOMAX ) 0.4 MG CAPS capsule Take 1 capsule (0.4 mg total) by mouth daily.  . triamcinolone  ointment (KENALOG ) 0.1 % Apply 1 Application topically 2 (two) times daily.  . zolpidem  (AMBIEN ) 5 MG tablet Take 1 tablet (5 mg total) by mouth at bedtime as needed for sleep.  . [DISCONTINUED] loperamide  (IMODIUM ) 2 MG capsule Take 1 capsule (2 mg total) by mouth 3 (three) times daily as needed for diarrhea or loose stools (45 minutes before meal). (Patient not taking: Reported on 12/20/2023)   No facility-administered medications prior to visit.    {Insert previous labs (optional):23779} {See past labs  Heme  Chem  Endocrine  Serology  Results Review (optional):1}   Objective    BP 114/77 (BP Location: Right Arm, Patient Position: Sitting, Cuff Size: Normal)   Pulse 70   Temp (!) 97.5 F (36.4 C) (Oral)   Ht 5' 7 (1.702 m)   Wt 187 lb (84.8 kg)   SpO2 100%   BMI 29.29 kg/m  {Insert last BP/Wt (optional):23777}{See vitals history (optional):1}   Physical Exam Constitutional:      Appearance: Normal appearance.  HENT:     Head: Normocephalic and atraumatic.  Eyes:     General: No scleral icterus.    Extraocular Movements: Extraocular movements intact.     Conjunctiva/sclera: Conjunctivae normal.  Cardiovascular:     Rate and Rhythm: Normal rate and regular rhythm.     Pulses: Normal pulses.     Heart sounds: Normal heart sounds.  Pulmonary:     Effort: Pulmonary effort is normal. No respiratory distress.     Breath sounds: Normal breath sounds.  Musculoskeletal:     Right lower leg: No edema.     Left lower leg: No edema.  Skin:    General: Skin is warm and dry.  Neurological:     Mental Status: She is alert and oriented to person, place, and time. Mental status is at baseline.  Psychiatric:        Mood and Affect: Mood normal.        Behavior: Behavior normal.      Results for orders placed or  performed in visit on 12/20/23  Urine Culture   UR  Result Value Ref Range   Urine Culture, Routine Final report (A)    Organism ID, Bacteria Citrobacter freundii (A)    ORGANISM ID, BACTERIA Not applicable    Antimicrobial Susceptibility Comment   Comprehensive metabolic panel with GFR  Result Value Ref Range   Glucose 90 70 - 99 mg/dL   BUN 27 8 - 27 mg/dL   Creatinine, Ser 8.38 (H) 0.57 - 1.00 mg/dL   eGFR 34 (L) >40 fO/fpw/8.26   BUN/Creatinine Ratio 17 12 - 28   Sodium 139 134 - 144 mmol/L   Potassium 4.4 3.5 - 5.2 mmol/L   Chloride 102 96 - 106 mmol/L   CO2 22 20 - 29 mmol/L   Calcium  9.8 8.7 - 10.3 mg/dL   Total Protein 7.0 6.0 - 8.5 g/dL  Albumin  4.5 3.9 - 4.9 g/dL   Globulin, Total 2.5 1.5 - 4.5 g/dL   Bilirubin Total 0.3 0.0 - 1.2 mg/dL   Alkaline Phosphatase 75 49 - 135 IU/L   AST 23 0 - 40 IU/L   ALT 22 0 - 32 IU/L  Hemoglobin A1c  Result Value Ref Range   Hgb A1c MFr Bld 5.6 4.8 - 5.6 %   Est. average glucose Bld gHb Est-mCnc 114 mg/dL  Lipid Panel With LDL/HDL Ratio  Result Value Ref Range   Cholesterol, Total 214 (H) 100 - 199 mg/dL   Triglycerides 722 (H) 0 - 149 mg/dL   HDL 47 >60 mg/dL   VLDL Cholesterol Cal 48 (H) 5 - 40 mg/dL   LDL Chol Calc (NIH) 880 (H) 0 - 99 mg/dL   LDL/HDL Ratio 2.5 0.0 - 3.2 ratio    Assessment & Plan    Type 2 diabetes mellitus with diabetic polyneuropathy, without long-term current use of insulin  (HCC) -     Hemoglobin A1c  Hyperlipidemia associated with type 2 diabetes mellitus (HCC) -     Lipid Panel With LDL/HDL Ratio  Hypertension associated with diabetes (HCC) -     Comprehensive metabolic panel with GFR  Chest pain, unspecified type -     Ambulatory referral to Cardiology  Ventricular trigeminy -     Ambulatory referral to Cardiology  Cloudy urine -     Urine Culture -     Urine Culture  Need for influenza vaccination     Type 2 diabetes mellitus with diabetic polyneuropathy and circulatory  complications A1c controlled at 5.7% with Ozempic . Unable to monitor glucose at home. No hypoglycemia reported. - Continue Ozempic  as prescribed. - Address glucose monitor issues for home monitoring.  Essential hypertension Blood pressure generally controlled. Dizziness when blood pressure is lower. Halved hydrochlorothiazide  due to dizziness. - Discontinue hydrochlorothiazide . - Monitor blood pressure and dizziness. - Record blood pressure and dizziness patterns for follow-up.  Dizziness Dizziness related to blood pressure fluctuations. - Monitor dizziness in relation to blood pressure changes. - Consider trial of meclizine if dizziness persists.  Recurrent urinary tract infections Cloudy urine and recurrent infections. On low-dose antibiotics and supplement regimen. - Continue low-dose antibiotic regimen. - Continue supplement regimen as discussed with urologist.  Insomnia Difficulty maintaining sleep. Ambien  5 mg less effective. Uses essential oils. - Discuss potential adjustment of Ambien  dosage with prescribing physician.  Hypertriglyceridemia Difficulty with medication due to pill size. Triglyceride levels not recently monitored. - Order blood work to assess triglyceride levels.  Rheumatoid arthritis Reduced inflammation and improved mobility with current supplements. Previous medications caused adverse effects. - Continue current supplement regimen for inflammation control.   *** Hypertension associated with diabetes (HCC) Assessment & Plan: Intermittent hypertension with recent elevated readings. Considering reintroduction of hydrochlorothiazide  due to dizziness and elevated blood pressure. - Prescribe hydrochlorothiazide  12.5 mg. - Monitor blood pressure and symptoms at home and report changes.   Return in about 2 months (around 02/19/2024) for HTN, dizziness.      I discussed the assessment and treatment plan with the patient  The patient was provided an  opportunity to ask questions and all were answered. The patient agreed with the plan and demonstrated an understanding of the instructions.   The patient was advised to call back or seek an in-person evaluation if the symptoms worsen or if the condition fails to improve as anticipated.    LAURAINE LOISE BUOY, DO  [facility]  [phone] (phone) [phone] (fax)  [facility]

## 2023-12-29 ENCOUNTER — Encounter: Payer: Self-pay | Admitting: Family Medicine

## 2023-12-29 DIAGNOSIS — E1169 Type 2 diabetes mellitus with other specified complication: Secondary | ICD-10-CM

## 2023-12-29 MED ORDER — CIPROFLOXACIN HCL 500 MG PO TABS: 500.0000 mg | ORAL_TABLET | Freq: Two times a day (BID) | ORAL | 0 refills | Status: AC

## 2024-01-01 ENCOUNTER — Ambulatory Visit: Payer: Self-pay | Admitting: Family Medicine

## 2024-01-01 MED ORDER — ROSUVASTATIN CALCIUM 40 MG PO TABS: 40.0000 mg | ORAL_TABLET | Freq: Every day | ORAL | 3 refills | Status: AC

## 2024-01-01 NOTE — Progress Notes (Signed)
 Seen by patient Jean Gonzales on 12/29/2023  9:44 AM

## 2024-01-08 ENCOUNTER — Ambulatory Visit: Payer: Self-pay

## 2024-01-10 DIAGNOSIS — I10 Essential (primary) hypertension: Secondary | ICD-10-CM | POA: Diagnosis not present

## 2024-01-10 DIAGNOSIS — R609 Edema, unspecified: Secondary | ICD-10-CM | POA: Diagnosis not present

## 2024-01-10 DIAGNOSIS — R809 Proteinuria, unspecified: Secondary | ICD-10-CM | POA: Diagnosis not present

## 2024-01-10 DIAGNOSIS — E6689 Other obesity not elsewhere classified: Secondary | ICD-10-CM | POA: Diagnosis not present

## 2024-01-10 DIAGNOSIS — K219 Gastro-esophageal reflux disease without esophagitis: Secondary | ICD-10-CM | POA: Diagnosis not present

## 2024-01-10 DIAGNOSIS — E1122 Type 2 diabetes mellitus with diabetic chronic kidney disease: Secondary | ICD-10-CM | POA: Diagnosis not present

## 2024-01-14 ENCOUNTER — Encounter: Payer: Self-pay | Admitting: Urology

## 2024-01-16 ENCOUNTER — Encounter: Payer: Self-pay | Admitting: Cardiology

## 2024-01-16 ENCOUNTER — Ambulatory Visit: Attending: Cardiology | Admitting: Cardiology

## 2024-01-16 VITALS — BP 138/78 | Ht 67.0 in | Wt 191.8 lb

## 2024-01-16 DIAGNOSIS — R079 Chest pain, unspecified: Secondary | ICD-10-CM | POA: Diagnosis not present

## 2024-01-16 DIAGNOSIS — E782 Mixed hyperlipidemia: Secondary | ICD-10-CM | POA: Diagnosis not present

## 2024-01-16 DIAGNOSIS — I10 Essential (primary) hypertension: Secondary | ICD-10-CM | POA: Diagnosis not present

## 2024-01-16 NOTE — Progress Notes (Signed)
 Cardiology Office Note:    Date:  01/16/2024   ID:  [name], [name] April 30, 1955, MRN [id number]  PCP:  Donzella Lauraine SAILOR, DO   [facility] Providers Cardiologist:  Redell Cave, MD     Referring MD: Donzella Lauraine SAILOR, DO   Chief Complaint  Patient presents with   Follow-up    Chest pain pt has been doing well with no complaints of chest pain, chest pressure or SOB, medciation reviewed verbally with patient    History of Present Illness:    Jean Gonzales is a 68 y.o. female with a hx of hypertension, hyperlipidemia, GERD who presents with chest pain.  States having chest pain which she describes as little twinges in her chest associated with stress.  States being a stressful situation at home, usually argues with her husband leading to symptoms of chest pain.  Symptoms of chest discomfort are not associated with exertion, food intake.  Symptoms overall have improved since ignoring because well, staying in a room and being less stressful.  Last cholesterol was elevated, Crestor  increased to 40 mg daily by primary care physician.  Was previously on HCTZ for BP control, BP has been adequately controlled off BP meds.   Prior notes/testing Echo 12/2020 EF 60 to 65%.  Impaired relaxation. Lexiscan  Myoview  09/2020 no significant ischemia, low risk scan    Past Medical History:  Diagnosis Date   Ankylosing spondylitis (HCC)    Arthritis    osteoarthritis right knee   Diet-controlled type 2 diabetes mellitus (HCC)    Endometrial cancer (HCC) 2014   GERD (gastroesophageal reflux disease)    History of kidney stones    History of recurrent UTIs    a.) r/t self catheterization techniques and frequency; b.) followed by urology and is on daily suppressive antimicrobial therapy   Hx of migraine headaches    Hyperlipidemia    Hypertension    improved after ca tx   Intermittent self-catheterization of bladder    a.) required following XRT causing stenosis of urinary  meatus; b.) self caths TWICE daily   Psoriatic arthritis (HCC)    Urethral meatal stenosis    a.) following XRT for endometrial cancer    Past Surgical History:  Procedure Laterality Date   ABDOMINAL HYSTERECTOMY  2015   cancer   BREAST BIOPSY Right 2008ish   core   CATARACT EXTRACTION W/PHACO Right 02/05/2018   Procedure: CATARACT EXTRACTION PHACO AND INTRAOCULAR LENS PLACEMENT (IOC) RIGHT DIABETES IVA TOPICAL;  Surgeon: Myrna Adine Anes, MD;  Location: [facility];  Service: Ophthalmology;  Laterality: Right;   CATARACT EXTRACTION W/PHACO Left 02/25/2018   Procedure: CATARACT EXTRACTION PHACO AND INTRAOCULAR LENS PLACEMENT (IOC) LEFT DIABETIC;  Surgeon: Myrna Adine Anes, MD;  Location: [facility];  Service: Ophthalmology;  Laterality: Left;   CHOLECYSTECTOMY  1992   COLONOSCOPY WITH PROPOFOL  N/A 08/08/2019   Procedure: COLONOSCOPY WITH PROPOFOL ;  Surgeon: Therisa Bi, MD;  Location: [facility];  Service: Gastroenterology;  Laterality: N/A;   EYE SURGERY     KNEE ARTHROSCOPY WITH MEDIAL MENISECTOMY Left 12/06/2021   Procedure: KNEE ARTHROSCOPY WITH PARTIAL MEDIAL LATERAL MENISECTOMY, PARTIAL SYNOVECTOMY, LOOSE BODY REMOVAL;  Surgeon: Leora Lynwood SAUNDERS, MD;  Location: [facility];  Service: Orthopedics;  Laterality: Left;   TONSILLECTOMY  1973   TONSILLECTOMY     TOTAL KNEE ARTHROPLASTY Right 07/09/2017   Procedure: TOTAL KNEE ARTHROPLASTY;  Surgeon: Leora Lynwood SAUNDERS, MD;  Location: [facility];  Service: Orthopedics;  Laterality: Right;  Current Medications: Current Meds  Medication Sig   Alcohol  Swabs  70 % PADS 1 each by Does not apply route daily before breakfast.   Blood Glucose Monitoring Suppl DEVI 1 each by Does not apply route daily before breakfast. May substitute to any manufacturer covered by patient's insurance.   cephALEXin  (KEFLEX ) 250 MG capsule Take 1 capsule (250 mg total) by mouth daily.   diclofenac  Sodium (VOLTAREN ) 1 % GEL Apply 2 g topically 4  (four) times daily as needed.   esomeprazole  (NEXIUM ) 40 MG capsule Take 1 capsule (40 mg total) by mouth daily.   fenofibrate  (TRICOR ) 145 MG tablet Take 1 tablet (145 mg total) by mouth daily.   Glucose Blood (BLOOD GLUCOSE TEST STRIPS) STRP 1 each by In Vitro route daily before breakfast. May substitute to any manufacturer covered by patient's insurance.   hydrochlorothiazide  (HYDRODIURIL ) 25 MG tablet Take 25 mg by mouth daily.   Lancet Device MISC 1 each by Does not apply route daily before breakfast. May substitute to any manufacturer covered by patient's insurance.   nystatin  (MYCOSTATIN /NYSTOP ) powder Apply 1 Application topically 3 (three) times daily.   rosuvastatin  (CRESTOR ) 40 MG tablet Take 1 tablet (40 mg total) by mouth daily.   Semaglutide , 2 MG/DOSE, (OZEMPIC , 2 MG/DOSE,) 8 MG/3ML SOPN Inject 2 mg into the skin once a week.   tamsulosin  (FLOMAX ) 0.4 MG CAPS capsule Take 1 capsule (0.4 mg total) by mouth daily.   triamcinolone  ointment (KENALOG ) 0.1 % Apply 1 Application topically 2 (two) times daily.   zolpidem  (AMBIEN ) 5 MG tablet Take 1 tablet (5 mg total) by mouth at bedtime as needed for sleep.     Allergies:   Iodinated contrast media and Bactrim  [sulfamethoxazole -trimethoprim ]   Social History   Socioeconomic History   Marital status: Divorced    Spouse name: Not on file   Number of children: Not on file   Years of education: Not on file   Highest education level: Not on file  Occupational History   Not on file  Tobacco Use   Smoking status: Never    Passive exposure: Never   Smokeless tobacco: Never  Vaping Use   Vaping status: Never Used  Substance and Sexual Activity   Alcohol  use: No    Alcohol /week: 0.0 standard drinks of alcohol    Drug use: No   Sexual activity: Not Currently  Other Topics Concern   Not on file  Social History Narrative   Not on file   Social Drivers of Health   Financial Resource Strain: Low Risk  (06/18/2023)   Overall  Financial Resource Strain (CARDIA)    Difficulty of Paying Living Expenses: Not hard at all  Food Insecurity: No Food Insecurity (06/18/2023)   Hunger Vital Sign    Worried About Running Out of Food in the Last Year: Never true    Ran Out of Food in the Last Year: Never true  Transportation Needs: No Transportation Needs (06/18/2023)   PRAPARE - Administrator, Civil Service (Medical): No    Lack of Transportation (Non-Medical): No  Physical Activity: Sufficiently Active (12/22/2021)   Exercise Vital Sign    Days of Exercise per Week: 4 days    Minutes of Exercise per Session: 60 min  Stress: No Stress Concern Present (06/18/2023)   [employer] of Occupational Health - Occupational Stress Questionnaire    Feeling of Stress : Not at all  Social Connections: Moderately Integrated (12/22/2021)   Social Connection and Isolation Panel  Frequency of Communication with Friends and Family: More than three times a week    Frequency of Social Gatherings with Friends and Family: Twice a week    Attends Religious Services: More than 4 times per year    Active Member of [employer] or Organizations: No    Attends Banker Meetings: Never    Marital Status: Married     Family History: The patient's family history includes Cancer in her mother; Diabetes in her mother; Heart disease in her father. There is no history of Breast cancer, Kidney cancer, or Bladder Cancer.  ROS:   Please see the history of present illness.     All other systems reviewed and are negative.  EKGs/Labs/Other Studies Reviewed:    The following studies were reviewed today:   EKG Interpretation Date/Time:  Wednesday January 16 2024 08:13:57 EDT Ventricular Rate:  65 PR Interval:  176 QRS Duration:  94 QT Interval:  406 QTC Calculation: 422 R Axis:   -1  Text Interpretation: Normal sinus rhythm Possible Lateral infarct Confirmed by Darliss Rogue ([id number]) on 01/16/2024 8:35:40 AM     Recent Labs: 12/20/2023: ALT 22; BUN 27; Creatinine, Ser 1.61; Potassium 4.4; Sodium 139  Recent Lipid Panel    Component Value Date/Time   CHOL 214 (H) 12/20/2023 1114   TRIG 277 (H) 12/20/2023 1114   HDL 47 12/20/2023 1114   CHOLHDL 3.6 06/18/2023 1545   LDLCALC 119 (H) 12/20/2023 1114     Risk Assessment/Calculations:          Physical Exam:    VS:  BP 138/78 (BP Location: Left Arm, Patient Position: Sitting)   Ht 5' 7 (1.702 m)   Wt 191 lb 12.8 oz (87 kg)   SpO2 98%   BMI 30.04 kg/m     Wt Readings from Last 3 Encounters:  01/16/24 191 lb 12.8 oz (87 kg)  12/20/23 187 lb (84.8 kg)  09/19/23 185 lb 11.2 oz (84.2 kg)     GEN:  Well nourished, well developed in no acute distress HEENT: Normal NECK: No JVD; No carotid bruits CARDIAC: RRR, no murmurs, rubs, gallops RESPIRATORY:  Clear to auscultation without rales, wheezing or rhonchi  ABDOMEN: Soft, non-tender, non-distended MUSCULOSKELETAL:  No edema; No deformity  SKIN: Warm and dry NEUROLOGIC:  Alert and oriented x 3 PSYCHIATRIC:  Normal affect   ASSESSMENT:    1. Chest pain of uncertain etiology   2. Mixed hyperlipidemia   3. Primary hypertension     PLAN:    In order of problems listed above:  Chest pain, appears atypical, minimal, stress related.  Previous workup earlier echo shows normal EF 60%, impaired relaxation. Lexiscan  myoview  09/2020 showed no significant ischemia.  History of anaphylaxis with iodine contrast.  Monitor with no additional testing.  If symptoms persist, will consider repeating Myoview .  Symptoms are very minimal. Mixed hyperlipidemia, continue Crestor  40 mg daily, fenofibrate .  Obtain fasting lipid profile in 3 months. Hypertension, BP controlled off medications.  Follow-up in 3 months.     Medication Adjustments/Labs and Tests Ordered: Current medicines are reviewed at length with the patient today.  Concerns regarding medicines are outlined above.  Orders Placed  This Encounter  Procedures   Lipid panel   EKG 12-Lead     No orders of the defined types were placed in this encounter.     Patient Instructions  Medication Instructions:  Your physician recommends that you continue on your current medications as directed. Please refer to the  Current Medication list given to you today.   *If you need a refill on your cardiac medications before your next appointment, please call your pharmacy*  Lab Work: Your provider would like for you to return in 3 MONTHS to have the following labs drawn: LIPID PANEL.  Please go to [facility] [address] ([facility] [address], [place] [address] You do not need an appointment.  They are open from 8 am- 4:30 pm.  Lunch from 1:00 pm- 2:00 pm You DO need to be fasting.  If you have labs (blood work) drawn today and your tests are completely normal, you will receive your results only by: MyChart Message (if you have MyChart) OR A paper copy in the mail If you have any lab test that is abnormal or we need to change your treatment, we will call you to review the results.  Testing/Procedures: No test ordered today   Follow-Up: At [facility], you and your health needs are our priority.  As part of our continuing mission to provide you with exceptional heart care, our providers are all part of one team.  This team includes your primary Cardiologist (physician) and Advanced Practice Providers or APPs (Physician Assistants and Nurse Practitioners) who all work together to provide you with the care you need, when you need it.  Your next appointment:   4 month(s)  Provider:   You may see Redell Cave, MD or one of the following Advanced Practice Providers on your designated Care Team:   Lonni Meager, NP Lesley Maffucci, PA-C Bernardino Bring, PA-C Cadence [place], PA-C Tylene Lunch, NP Barnie Hila, NP    We recommend signing up for the patient portal called MyChart.   Sign up information is provided on this After Visit Summary.  MyChart is used to connect with patients for Virtual Visits (Telemedicine).  Patients are able to view lab/test results, encounter notes, upcoming appointments, etc.  Non-urgent messages can be sent to your provider as well.   To learn more about what you can do with MyChart, go to [email or web address].             Signed, Redell Cave, MD  01/16/2024 12:39 PM    [facility]

## 2024-01-16 NOTE — Patient Instructions (Signed)
 Medication Instructions:  Your physician recommends that you continue on your current medications as directed. Please refer to the Current Medication list given to you today.   *If you need a refill on your cardiac medications before your next appointment, please call your pharmacy*  Lab Work: Your provider would like for you to return in 3 MONTHS to have the following labs drawn: LIPID PANEL.  Please go to [facility] [address] ([facility]) [address], [place] [address] You do not need an appointment.  They are open from 8 am- 4:30 pm.  Lunch from 1:00 pm- 2:00 pm You DO need to be fasting.  If you have labs (blood work) drawn today and your tests are completely normal, you will receive your results only by: MyChart Message (if you have MyChart) OR A paper copy in the mail If you have any lab test that is abnormal or we need to change your treatment, we will call you to review the results.  Testing/Procedures: No test ordered today   Follow-Up: At [facility], you and your health needs are our priority.  As part of our continuing mission to provide you with exceptional heart care, our providers are all part of one team.  This team includes your primary Cardiologist (physician) and Advanced Practice Providers or APPs (Physician Assistants and Nurse Practitioners) who all work together to provide you with the care you need, when you need it.  Your next appointment:   4 month(s)  Provider:   You may see Redell Cave, MD or one of the following Advanced Practice Providers on your designated Care Team:   Lonni Meager, NP Lesley Maffucci, PA-C Bernardino Bring, PA-C Cadence [place], PA-C Tylene Lunch, NP Barnie Hila, NP    We recommend signing up for the patient portal called MyChart.  Sign up information is provided on this After Visit Summary.  MyChart is used to connect with patients for Virtual Visits (Telemedicine).  Patients are able to  view lab/test results, encounter notes, upcoming appointments, etc.  Non-urgent messages can be sent to your provider as well.   To learn more about what you can do with MyChart, go to [email or web address].

## 2024-02-18 ENCOUNTER — Ambulatory Visit: Admitting: Urology

## 2024-02-18 VITALS — BP 126/79 | HR 89

## 2024-02-18 DIAGNOSIS — Z87440 Personal history of urinary (tract) infections: Secondary | ICD-10-CM | POA: Diagnosis not present

## 2024-02-18 DIAGNOSIS — N390 Urinary tract infection, site not specified: Secondary | ICD-10-CM

## 2024-02-18 DIAGNOSIS — C669 Malignant neoplasm of unspecified ureter: Secondary | ICD-10-CM

## 2024-02-18 DIAGNOSIS — N3020 Other chronic cystitis without hematuria: Secondary | ICD-10-CM | POA: Diagnosis not present

## 2024-02-18 NOTE — Progress Notes (Signed)
 02/18/2024 8:10 AM   Jean Gonzales Apr 05, 1955 [id number]  Referring provider: Donzella Lauraine SAILOR, DO [address] [place],  [place] [address]  Chief Complaint  Patient presents with   Cysto    HPI: [place]: Recurrent urinary tract infections with multiple positive cultures.  Vaginal atrophy with history of endometrial carcinoma.  She may have urethral stenosis from pelvic surgery.  Had normal cystoscopy and was on low-dose Macrodantin .  She catheterizes once a day and gets residuals of 500 to 700 mL.  Has been given Flomax  in the past.  Normal renal x-ray December 2021   The patient had a hysterectomy followed by chemotherapy and radiation for endometrial carcinoma about 6 years ago.  It appears last 2 years she was being dilated at a rate such that she started to catheterize even daily.  Now she catheterizes once a day.  She could not get 500 cc to a liter.  She generally voids 3-4 times a day only a few cc.  She gets up once a night.  When she voids her flow was weak.  She hesitates.  She does not strain.  She does not feel empty  She gets recurrent bladder infections with dark cloudy foul-smelling urine that respond favorably antibiotics.  She is in serious infection at least monthly or more often.  Few days later symptoms returned.  No pain or fever   Her urethral stenosis has dated back before 2019 when I reviewed the consult note.  She has rare incontinence and wears a pad for confidence.   She is convinced that antibiotics are not working.  It appears she had been on Macrodantin  for 3 months but others have not been tried     We talked about colonization and over treating mild symptoms.  Educated patient about pathophysiology of recurrent urinary tract infections superimposed on high residuals and catheterizing.  Reassess in 6 weeks on daily Keflex  250 mg.  She gets a rash from sulfa .  Trimethoprim  potentially an option.  Reeducation regarding history of  discussed.  Probiotics discussed.  Cranberry discussed.  Reassess 6-week   I do not think there is any role for urodynamics or sacral nerve stimulation at this stage   Patient was on the daily Keflex  and still had foul-smelling cloudy urine.  She is hoping to get an antibiotic for 7 to 14 days to cleared up and she is trying an over-the-counter preparation that coats the lining of the bladder.  She still catheterizes once a day.  I called in ciprofloxacin  250 mg twice a day for 10 days.  She understands that we may not be able to prevent her urine from being foul-smelling and cloudy but we have limited tools including daily Keflex .  She has been on Macrodantin  daily in the past.     call if urine culture differs.  Called in ciprofloxacin  for 10 days.  Utilize over-the-counter preparation.  Continue to catheterize once a day.  We both agreed to stay on the daily Keflex .  She will increase catheterization to once in the morning and once at night hoping this helps some.  In the past she is actually done 3 times a day which is probably not necessary.  She understands prophylaxis options are limited.   Today Incomplete bladder emptying stable.  Last culture positive.  Patient has done quite well on catheterizing twice a day, once a day Keflex  and taking over-the-counter UQORA twice a day.  She was doing really well until she  got COVID on Christmas Day.  She is back on track and clinically not infected     Today Patient incomplete emptying stable.  Last culture was positive but she is colonized.  She has little bit of sediment in her urine that she is well aware of.  Clinically she doing great on daily Keflex  and the UQORA which helps keeps the urine more clear.   She does not know why but her primary send her to nephrology.  I do not see anything obvious in the urinalysis.  She had a recent positive culture but again she is colonized.     Today Incomplete bladder emptying stable.  Renal ultrasound July  2024 normal kidneys.  Still catheterizes to today.  Had a urine culture at family medicine last week and was told it was normal.  Clinically not infected.  She thinks the UQora helps with the once a day cephalexin .    In October 2025 the patient had a renal ultrasound.  Both kidneys were normal.  She had a thickened bladder and I reviewed it.  I brought her in for cystoscopy.  She has had 1 bladder infection in the last year doing well on Keflex .  She gets a little bit of sediment.  She can catheterized for 1 L.  She does void spontaneously  Patient underwent flexible cystoscopy.  I spent 5 to 10 minutes feeling her bladder probably to approximately 800 mL.  There was a lot of sediment.  There was almost a little bit of pseudomembrane but it was sediment along the right lateral wall and the floor the bladder near the trigone.  As I feel the urothelium looked normal with only very mild faint hyperemia.  There was a small diverticulum or large saccule on the right side near the dome.  Urine was aspirated sent for culture.  It was not foul-smelling.  She had a thickened bladder on the CT scan 2021.     PMH: Past Medical History:  Diagnosis Date   Ankylosing spondylitis (HCC)    Arthritis    osteoarthritis right knee   Diet-controlled type 2 diabetes mellitus (HCC)    Endometrial cancer (HCC) 2014   GERD (gastroesophageal reflux disease)    History of kidney stones    History of recurrent UTIs    a.) r/t self catheterization techniques and frequency; b.) followed by urology and is on daily suppressive antimicrobial therapy   Hx of migraine headaches    Hyperlipidemia    Hypertension    improved after ca tx   Intermittent self-catheterization of bladder    a.) required following XRT causing stenosis of urinary meatus; b.) self caths TWICE daily   Psoriatic arthritis (HCC)    Urethral meatal stenosis    a.) following XRT for endometrial cancer    Surgical History: Past Surgical History:   Procedure Laterality Date   ABDOMINAL HYSTERECTOMY  2015   cancer   BREAST BIOPSY Right 2008ish   core   CATARACT EXTRACTION W/PHACO Right 02/05/2018   Procedure: CATARACT EXTRACTION PHACO AND INTRAOCULAR LENS PLACEMENT (IOC) RIGHT DIABETES IVA TOPICAL;  Surgeon: Myrna Adine Anes, MD;  Location: [facility];  Service: Ophthalmology;  Laterality: Right;   CATARACT EXTRACTION W/PHACO Left 02/25/2018   Procedure: CATARACT EXTRACTION PHACO AND INTRAOCULAR LENS PLACEMENT (IOC) LEFT DIABETIC;  Surgeon: Myrna Adine Anes, MD;  Location: [facility];  Service: Ophthalmology;  Laterality: Left;   CHOLECYSTECTOMY  1992   COLONOSCOPY WITH PROPOFOL  N/A 08/08/2019   Procedure:  COLONOSCOPY WITH PROPOFOL ;  Surgeon: Therisa Bi, MD;  Location: [facility];  Service: Gastroenterology;  Laterality: N/A;   EYE SURGERY     KNEE ARTHROSCOPY WITH MEDIAL MENISECTOMY Left 12/06/2021   Procedure: KNEE ARTHROSCOPY WITH PARTIAL MEDIAL LATERAL MENISECTOMY, PARTIAL SYNOVECTOMY, LOOSE BODY REMOVAL;  Surgeon: Leora Lynwood SAUNDERS, MD;  Location: [facility];  Service: Orthopedics;  Laterality: Left;   TONSILLECTOMY  1973   TONSILLECTOMY     TOTAL KNEE ARTHROPLASTY Right 07/09/2017   Procedure: TOTAL KNEE ARTHROPLASTY;  Surgeon: Leora Lynwood SAUNDERS, MD;  Location: [facility];  Service: Orthopedics;  Laterality: Right;    Home Medications:  Allergies as of 02/18/2024       Reactions   Iodinated Contrast Media Anaphylaxis   Bactrim  [sulfamethoxazole -trimethoprim ] Itching, Rash        Medication List        Accurate as of February 18, 2024  8:10 AM. If you have any questions, ask your nurse or doctor.          Alcohol  Swabs  70 % Pads 1 each by Does not apply route daily before breakfast.   Blood Glucose Monitoring Suppl Devi 1 each by Does not apply route daily before breakfast. May substitute to any manufacturer covered by patient's insurance.   BLOOD GLUCOSE TEST STRIPS Strp 1 each by In Vitro  route daily before breakfast. May substitute to any manufacturer covered by patient's insurance.   celecoxib  100 MG capsule Commonly known as: CELEBREX  Take 1 capsule (100 mg total) by mouth 2 (two) times daily as needed.   cephALEXin  250 MG capsule Commonly known as: KEFLEX  Take 1 capsule (250 mg total) by mouth daily.   diclofenac  Sodium 1 % Gel Commonly known as: VOLTAREN  Apply 2 g topically 4 (four) times daily as needed.   esomeprazole  40 MG capsule Commonly known as: NEXIUM  Take 1 capsule (40 mg total) by mouth daily.   fenofibrate  145 MG tablet Commonly known as: Tricor  Take 1 tablet (145 mg total) by mouth daily.   hydrochlorothiazide  25 MG tablet Commonly known as: HYDRODIURIL  Take 25 mg by mouth daily.   Lancet Device Misc 1 each by Does not apply route daily before breakfast. May substitute to any manufacturer covered by patient's insurance.   nystatin  powder Commonly known as: MYCOSTATIN /NYSTOP  Apply 1 Application topically 3 (three) times daily.   Ozempic  (2 MG/DOSE) 8 MG/3ML Sopn Generic drug: Semaglutide  (2 MG/DOSE) Inject 2 mg into the skin once a week.   rosuvastatin  40 MG tablet Commonly known as: CRESTOR  Take 1 tablet (40 mg total) by mouth daily.   tamsulosin  0.4 MG Caps capsule Commonly known as: FLOMAX  Take 1 capsule (0.4 mg total) by mouth daily.   triamcinolone  ointment 0.1 % Commonly known as: KENALOG  Apply 1 Application topically 2 (two) times daily.   zolpidem  5 MG tablet Commonly known as: AMBIEN  Take 1 tablet (5 mg total) by mouth at bedtime as needed for sleep.        Allergies:  Allergies  Allergen Reactions   Iodinated Contrast Media Anaphylaxis   Bactrim  [Sulfamethoxazole -Trimethoprim ] Itching and Rash    Family History: Family History  Problem Relation Age of Onset   Diabetes Mother    Cancer Mother    Heart disease Father    Breast cancer Neg Hx    Kidney cancer Neg Hx    Bladder Cancer Neg Hx     Social  History:  reports that she has never smoked. She has never been exposed to tobacco  smoke. She has never used smokeless tobacco. She reports that she does not drink alcohol  and does not use drugs.  ROS:                                        Physical Exam: There were no vitals taken for this visit.  Constitutional:  Alert and oriented, No acute distress. HEENT: [place] AT, moist mucus membranes.  Trachea midline, no masses. \ Laboratory Data: Lab Results  Component Value Date   WBC 10.0 05/23/2022   HGB 13.6 05/23/2022   HCT 41.4 05/23/2022   MCV 91 05/23/2022   PLT 254 05/23/2022    Lab Results  Component Value Date   CREATININE 1.61 (H) 12/20/2023    No results found for: PSA  No results found for: TESTOSTERONE  Lab Results  Component Value Date   HGBA1C 5.6 12/20/2023    Urinalysis    Component Value Date/Time   COLORURINE STRAW (A) 01/01/2022 1242   APPEARANCEUR Hazy (A) 06/05/2022 1314   LABSPEC 1.003 (L) 01/01/2022 1242   PHURINE 7.0 01/01/2022 1242   GLUCOSEU Negative 06/05/2022 1314   HGBUR SMALL (A) 01/01/2022 1242   BILIRUBINUR Negative 06/05/2022 1314   KETONESUR NEGATIVE 01/01/2022 1242   PROTEINUR Negative 06/05/2022 1314   PROTEINUR NEGATIVE 01/01/2022 1242   UROBILINOGEN 0.2 09/19/2021 1340   UROBILINOGEN 0.2 01/25/2007 1533   NITRITE Negative 06/05/2022 1314   NITRITE NEGATIVE 01/01/2022 1242   LEUKOCYTESUR 2+ (A) 06/05/2022 1314   LEUKOCYTESUR LARGE (A) 01/01/2022 1242    Pertinent Imaging: Urine reviewed and sent for culture  Assessment & Plan: The patient has a thickened bladder.  She understands the concept of colonization.  I sent urine for culture.  Clinically not infected today.  Only because she has had radiation and a gynecological malignancy I thought it was best to get a CT scan just to make certain I did not miss anything on today's cystoscopy.  She has been catheterizing now for 10 years.  I will renew the  cephalexin  and I will call her if the report is abnormal.  Otherwise seen one year  1. Urinary tract infection without hematuria, site unspecified (Primary)  - Urinalysis, Complete  2. Chronic cystitis  - Urinalysis, Complete   No follow-ups on file.  Glendia DELENA Elizabeth, MD  [facility] [address], [address] [place], [place] [address] [phone]

## 2024-02-19 ENCOUNTER — Ambulatory Visit: Admitting: Family Medicine

## 2024-02-22 LAB — CULTURE, URINE COMPREHENSIVE

## 2024-02-25 ENCOUNTER — Ambulatory Visit: Payer: Self-pay

## 2024-02-25 DIAGNOSIS — N390 Urinary tract infection, site not specified: Secondary | ICD-10-CM

## 2024-02-25 MED ORDER — AMOXICILLIN-POT CLAVULANATE 875-125 MG PO TABS: 1.0000 | ORAL_TABLET | Freq: Two times a day (BID) | ORAL | 0 refills | Status: AC

## 2024-02-26 DIAGNOSIS — E1122 Type 2 diabetes mellitus with diabetic chronic kidney disease: Secondary | ICD-10-CM | POA: Diagnosis not present

## 2024-02-26 DIAGNOSIS — I10 Essential (primary) hypertension: Secondary | ICD-10-CM | POA: Diagnosis not present

## 2024-02-26 DIAGNOSIS — N1832 Chronic kidney disease, stage 3b: Secondary | ICD-10-CM | POA: Diagnosis not present

## 2024-02-26 DIAGNOSIS — D631 Anemia in chronic kidney disease: Secondary | ICD-10-CM | POA: Diagnosis not present

## 2024-02-26 DIAGNOSIS — E6689 Other obesity not elsewhere classified: Secondary | ICD-10-CM | POA: Diagnosis not present

## 2024-02-26 DIAGNOSIS — K219 Gastro-esophageal reflux disease without esophagitis: Secondary | ICD-10-CM | POA: Diagnosis not present

## 2024-02-26 DIAGNOSIS — R609 Edema, unspecified: Secondary | ICD-10-CM | POA: Diagnosis not present

## 2024-02-26 DIAGNOSIS — R809 Proteinuria, unspecified: Secondary | ICD-10-CM | POA: Diagnosis not present

## 2024-03-12 DIAGNOSIS — K219 Gastro-esophageal reflux disease without esophagitis: Secondary | ICD-10-CM | POA: Diagnosis not present

## 2024-03-12 DIAGNOSIS — E6689 Other obesity not elsewhere classified: Secondary | ICD-10-CM | POA: Diagnosis not present

## 2024-03-12 DIAGNOSIS — I10 Essential (primary) hypertension: Secondary | ICD-10-CM | POA: Diagnosis not present

## 2024-03-12 DIAGNOSIS — R609 Edema, unspecified: Secondary | ICD-10-CM | POA: Diagnosis not present

## 2024-03-12 DIAGNOSIS — R809 Proteinuria, unspecified: Secondary | ICD-10-CM | POA: Diagnosis not present

## 2024-03-12 DIAGNOSIS — E1122 Type 2 diabetes mellitus with diabetic chronic kidney disease: Secondary | ICD-10-CM | POA: Diagnosis not present

## 2024-04-12 ENCOUNTER — Other Ambulatory Visit: Payer: Self-pay | Admitting: Family Medicine

## 2024-04-12 DIAGNOSIS — E1142 Type 2 diabetes mellitus with diabetic polyneuropathy: Secondary | ICD-10-CM

## 2024-05-21 ENCOUNTER — Ambulatory Visit: Admitting: Cardiology

## 2024-06-30 ENCOUNTER — Ambulatory Visit: Admitting: Urology

## 2025-02-09 ENCOUNTER — Ambulatory Visit: Admitting: Urology
